# Patient Record
Sex: Male | Born: 1955 | Race: White | Hispanic: No | Marital: Married | Smoking: Never smoker
Health system: Southern US, Community
[De-identification: ages and names within clinical notes are randomized; demographics above are authoritative.]

## PROBLEM LIST (undated history)

## (undated) DIAGNOSIS — C4490 Unspecified malignant neoplasm of skin, unspecified: Secondary | ICD-10-CM

## (undated) DIAGNOSIS — R0609 Other forms of dyspnea: Secondary | ICD-10-CM

## (undated) DIAGNOSIS — M1990 Unspecified osteoarthritis, unspecified site: Secondary | ICD-10-CM

## (undated) DIAGNOSIS — L4050 Arthropathic psoriasis, unspecified: Secondary | ICD-10-CM

## (undated) DIAGNOSIS — G540 Brachial plexus disorders: Secondary | ICD-10-CM

## (undated) DIAGNOSIS — F419 Anxiety disorder, unspecified: Secondary | ICD-10-CM

## (undated) DIAGNOSIS — K219 Gastro-esophageal reflux disease without esophagitis: Secondary | ICD-10-CM

## (undated) DIAGNOSIS — I480 Paroxysmal atrial fibrillation: Secondary | ICD-10-CM

## (undated) DIAGNOSIS — Q211 Atrial septal defect: Secondary | ICD-10-CM

## (undated) DIAGNOSIS — K222 Esophageal obstruction: Secondary | ICD-10-CM

## (undated) DIAGNOSIS — L409 Psoriasis, unspecified: Secondary | ICD-10-CM

## (undated) DIAGNOSIS — I63413 Cerebral infarction due to embolism of bilateral middle cerebral arteries: Secondary | ICD-10-CM

## (undated) DIAGNOSIS — G4730 Sleep apnea, unspecified: Secondary | ICD-10-CM

## (undated) DIAGNOSIS — E663 Overweight: Secondary | ICD-10-CM

## (undated) DIAGNOSIS — J45909 Unspecified asthma, uncomplicated: Secondary | ICD-10-CM

## (undated) DIAGNOSIS — K559 Vascular disorder of intestine, unspecified: Secondary | ICD-10-CM

## (undated) DIAGNOSIS — Q2112 Patent foramen ovale: Secondary | ICD-10-CM

## (undated) DIAGNOSIS — I2510 Atherosclerotic heart disease of native coronary artery without angina pectoris: Secondary | ICD-10-CM

## (undated) DIAGNOSIS — N183 Chronic kidney disease, stage 3 unspecified: Secondary | ICD-10-CM

## (undated) DIAGNOSIS — Z9889 Other specified postprocedural states: Secondary | ICD-10-CM

## (undated) DIAGNOSIS — Z8774 Personal history of (corrected) congenital malformations of heart and circulatory system: Secondary | ICD-10-CM

## (undated) DIAGNOSIS — I10 Essential (primary) hypertension: Secondary | ICD-10-CM

## (undated) DIAGNOSIS — D689 Coagulation defect, unspecified: Secondary | ICD-10-CM

## (undated) DIAGNOSIS — I2089 Other forms of angina pectoris: Secondary | ICD-10-CM

## (undated) DIAGNOSIS — G4733 Obstructive sleep apnea (adult) (pediatric): Secondary | ICD-10-CM

## (undated) DIAGNOSIS — T7840XA Allergy, unspecified, initial encounter: Secondary | ICD-10-CM

## (undated) DIAGNOSIS — E785 Hyperlipidemia, unspecified: Secondary | ICD-10-CM

## (undated) DIAGNOSIS — E669 Obesity, unspecified: Secondary | ICD-10-CM

## (undated) DIAGNOSIS — G3184 Mild cognitive impairment, so stated: Secondary | ICD-10-CM

## (undated) DIAGNOSIS — I69311 Memory deficit following cerebral infarction: Secondary | ICD-10-CM

## (undated) DIAGNOSIS — I639 Cerebral infarction, unspecified: Secondary | ICD-10-CM

## (undated) DIAGNOSIS — F32A Depression, unspecified: Secondary | ICD-10-CM

## (undated) DIAGNOSIS — G4710 Hypersomnia, unspecified: Principal | ICD-10-CM

## (undated) DIAGNOSIS — D696 Thrombocytopenia, unspecified: Secondary | ICD-10-CM

## (undated) HISTORY — DX: Sleep apnea, unspecified: G47.30

## (undated) HISTORY — DX: Other specified postprocedural states: Z98.890

## (undated) HISTORY — DX: Coagulation defect, unspecified: D68.9

## (undated) HISTORY — DX: Atherosclerotic heart disease of native coronary artery without angina pectoris: I25.10

## (undated) HISTORY — DX: Atrial septal defect: Q21.1

## (undated) HISTORY — DX: Patent foramen ovale: Q21.12

## (undated) HISTORY — DX: Brachial plexus disorders: G54.0

## (undated) HISTORY — DX: Obstructive sleep apnea (adult) (pediatric): G47.33

## (undated) HISTORY — DX: Chronic kidney disease, stage 3 unspecified: N18.30

## (undated) HISTORY — DX: Paroxysmal atrial fibrillation: I48.0

## (undated) HISTORY — DX: Overweight: E66.3

## (undated) HISTORY — PX: CARDIAC CATHETERIZATION: SHX172

## (undated) HISTORY — DX: Essential (primary) hypertension: I10

## (undated) HISTORY — DX: Chronic kidney disease, stage 3 (moderate): N18.3

## (undated) HISTORY — DX: Memory deficit following cerebral infarction: I69.311

## (undated) HISTORY — DX: Hyperlipidemia, unspecified: E78.5

## (undated) HISTORY — PX: TONSILLECTOMY: SUR1361

## (undated) HISTORY — PX: PATENT FORAMEN OVALE CLOSURE: SHX2181

## (undated) HISTORY — DX: Depression, unspecified: F32.A

## (undated) HISTORY — DX: Thrombocytopenia, unspecified: D69.6

## (undated) HISTORY — DX: Allergy, unspecified, initial encounter: T78.40XA

## (undated) HISTORY — DX: Cerebral infarction, unspecified: I63.9

## (undated) HISTORY — DX: Arthropathic psoriasis, unspecified: L40.50

## (undated) HISTORY — DX: Unspecified asthma, uncomplicated: J45.909

## (undated) HISTORY — DX: Hypersomnia, unspecified: G47.10

---

## 1982-04-02 HISTORY — PX: APPENDECTOMY: SHX54

## 2005-04-02 DIAGNOSIS — I639 Cerebral infarction, unspecified: Secondary | ICD-10-CM

## 2005-04-02 HISTORY — DX: Cerebral infarction, unspecified: I63.9

## 2005-07-31 ENCOUNTER — Ambulatory Visit: Payer: Self-pay | Admitting: Cardiology

## 2005-08-19 ENCOUNTER — Ambulatory Visit: Payer: Self-pay | Admitting: Cardiology

## 2005-08-19 ENCOUNTER — Inpatient Hospital Stay (HOSPITAL_COMMUNITY): Admission: EM | Admit: 2005-08-19 | Discharge: 2005-08-22 | Payer: Self-pay | Admitting: Emergency Medicine

## 2005-08-20 ENCOUNTER — Encounter: Payer: Self-pay | Admitting: Cardiology

## 2005-09-07 ENCOUNTER — Emergency Department (HOSPITAL_COMMUNITY): Admission: EM | Admit: 2005-09-07 | Discharge: 2005-09-07 | Payer: Self-pay | Admitting: Emergency Medicine

## 2005-11-01 ENCOUNTER — Encounter: Payer: Self-pay | Admitting: Neurology

## 2005-11-01 ENCOUNTER — Ambulatory Visit: Payer: Self-pay | Admitting: Neurology

## 2005-11-30 ENCOUNTER — Ambulatory Visit: Payer: Self-pay | Admitting: Cardiology

## 2005-12-01 ENCOUNTER — Encounter: Payer: Self-pay | Admitting: Neurology

## 2005-12-07 ENCOUNTER — Ambulatory Visit: Payer: Self-pay | Admitting: Cardiology

## 2005-12-14 ENCOUNTER — Ambulatory Visit: Payer: Self-pay | Admitting: Cardiology

## 2005-12-18 ENCOUNTER — Ambulatory Visit: Payer: Self-pay

## 2005-12-21 ENCOUNTER — Ambulatory Visit: Payer: Self-pay | Admitting: Cardiovascular Disease

## 2005-12-25 ENCOUNTER — Ambulatory Visit: Payer: Self-pay

## 2006-01-01 ENCOUNTER — Ambulatory Visit: Payer: Self-pay | Admitting: *Deleted

## 2006-01-10 ENCOUNTER — Ambulatory Visit: Payer: Self-pay | Admitting: Cardiology

## 2006-01-26 ENCOUNTER — Ambulatory Visit: Payer: Self-pay | Admitting: Family Medicine

## 2006-02-04 ENCOUNTER — Ambulatory Visit: Payer: Self-pay

## 2006-02-05 ENCOUNTER — Ambulatory Visit: Payer: Self-pay | Admitting: *Deleted

## 2006-02-19 ENCOUNTER — Ambulatory Visit: Payer: Self-pay | Admitting: Internal Medicine

## 2006-03-08 ENCOUNTER — Ambulatory Visit: Payer: Self-pay | Admitting: Cardiology

## 2006-03-19 ENCOUNTER — Ambulatory Visit: Payer: Self-pay | Admitting: Neurology

## 2006-03-22 ENCOUNTER — Ambulatory Visit: Payer: Self-pay | Admitting: Cardiovascular Disease

## 2006-04-05 ENCOUNTER — Ambulatory Visit: Payer: Self-pay | Admitting: Cardiology

## 2006-04-24 ENCOUNTER — Ambulatory Visit: Payer: Self-pay | Admitting: Cardiology

## 2006-05-10 ENCOUNTER — Ambulatory Visit: Payer: Self-pay | Admitting: *Deleted

## 2006-05-14 ENCOUNTER — Ambulatory Visit: Payer: Self-pay

## 2006-05-31 ENCOUNTER — Ambulatory Visit: Payer: Self-pay | Admitting: Internal Medicine

## 2006-06-05 ENCOUNTER — Ambulatory Visit: Payer: Self-pay | Admitting: Neurology

## 2006-06-06 ENCOUNTER — Ambulatory Visit: Payer: Self-pay | Admitting: Neurology

## 2006-06-14 ENCOUNTER — Ambulatory Visit: Payer: Self-pay | Admitting: Cardiovascular Disease

## 2006-07-09 ENCOUNTER — Ambulatory Visit: Payer: Self-pay | Admitting: Cardiology

## 2006-07-12 ENCOUNTER — Ambulatory Visit: Payer: Self-pay | Admitting: Cardiovascular Disease

## 2006-08-02 ENCOUNTER — Ambulatory Visit: Payer: Self-pay | Admitting: Cardiology

## 2006-08-27 ENCOUNTER — Ambulatory Visit: Payer: Self-pay | Admitting: Cardiology

## 2006-09-20 ENCOUNTER — Ambulatory Visit: Payer: Self-pay | Admitting: Cardiovascular Disease

## 2006-10-01 ENCOUNTER — Ambulatory Visit: Payer: Self-pay | Admitting: Cardiology

## 2006-10-08 ENCOUNTER — Ambulatory Visit: Payer: Self-pay | Admitting: Cardiology

## 2006-10-09 ENCOUNTER — Ambulatory Visit: Payer: Self-pay | Admitting: Neurology

## 2006-11-01 ENCOUNTER — Ambulatory Visit: Payer: Self-pay | Admitting: Neurology

## 2006-11-08 ENCOUNTER — Ambulatory Visit: Payer: Self-pay | Admitting: Cardiology

## 2006-11-14 ENCOUNTER — Ambulatory Visit: Payer: Self-pay | Admitting: Cardiology

## 2006-12-06 ENCOUNTER — Ambulatory Visit: Payer: Self-pay | Admitting: Cardiology

## 2007-01-03 ENCOUNTER — Ambulatory Visit: Payer: Self-pay | Admitting: Internal Medicine

## 2007-01-14 ENCOUNTER — Ambulatory Visit: Payer: Self-pay | Admitting: Neurology

## 2007-02-06 ENCOUNTER — Ambulatory Visit: Payer: Self-pay

## 2007-03-07 ENCOUNTER — Ambulatory Visit: Payer: Self-pay

## 2007-04-04 ENCOUNTER — Ambulatory Visit: Payer: Self-pay | Admitting: Cardiology

## 2007-05-02 ENCOUNTER — Ambulatory Visit: Payer: Self-pay | Admitting: Internal Medicine

## 2007-05-30 ENCOUNTER — Ambulatory Visit: Payer: Self-pay | Admitting: Cardiology

## 2007-06-27 ENCOUNTER — Ambulatory Visit: Payer: Self-pay | Admitting: Cardiology

## 2007-07-31 ENCOUNTER — Ambulatory Visit: Payer: Self-pay | Admitting: Cardiology

## 2007-08-05 ENCOUNTER — Ambulatory Visit: Payer: Self-pay

## 2007-09-01 ENCOUNTER — Ambulatory Visit: Payer: Self-pay | Admitting: Cardiology

## 2007-09-30 ENCOUNTER — Ambulatory Visit: Payer: Self-pay | Admitting: Neurology

## 2007-09-30 ENCOUNTER — Ambulatory Visit: Payer: Self-pay | Admitting: Cardiology

## 2007-10-29 ENCOUNTER — Ambulatory Visit: Payer: Self-pay | Admitting: Cardiology

## 2007-11-24 ENCOUNTER — Ambulatory Visit: Payer: Self-pay | Admitting: Internal Medicine

## 2007-12-25 ENCOUNTER — Ambulatory Visit: Payer: Self-pay | Admitting: Cardiology

## 2007-12-31 ENCOUNTER — Encounter: Admission: RE | Admit: 2007-12-31 | Discharge: 2008-02-12 | Payer: Self-pay | Admitting: Neurology

## 2008-01-22 ENCOUNTER — Ambulatory Visit: Payer: Self-pay | Admitting: Cardiology

## 2008-02-19 ENCOUNTER — Ambulatory Visit: Payer: Self-pay | Admitting: Cardiology

## 2008-03-22 ENCOUNTER — Ambulatory Visit: Payer: Self-pay | Admitting: Internal Medicine

## 2008-04-02 DIAGNOSIS — Z9889 Other specified postprocedural states: Secondary | ICD-10-CM

## 2008-04-02 DIAGNOSIS — Z951 Presence of aortocoronary bypass graft: Secondary | ICD-10-CM

## 2008-04-02 HISTORY — DX: Presence of aortocoronary bypass graft: Z95.1

## 2008-04-02 HISTORY — DX: Other specified postprocedural states: Z98.890

## 2008-04-19 ENCOUNTER — Ambulatory Visit: Payer: Self-pay | Admitting: Internal Medicine

## 2008-04-23 ENCOUNTER — Ambulatory Visit: Payer: Self-pay | Admitting: Internal Medicine

## 2008-05-06 ENCOUNTER — Encounter: Payer: Self-pay | Admitting: Internal Medicine

## 2008-05-06 ENCOUNTER — Ambulatory Visit: Payer: Self-pay | Admitting: Cardiology

## 2008-05-06 LAB — CONVERTED CEMR LAB
HCT: 43.7 % (ref 39.0–52.0)
Hemoglobin: 14.7 g/dL (ref 13.0–17.0)
INR: 2 — ABNORMAL HIGH (ref 0.0–1.5)
MCV: 88.3 fL (ref 78.0–100.0)
RBC: 4.95 M/uL (ref 4.22–5.81)
RDW: 13.3 % (ref 11.5–15.5)
Sodium: 141 meq/L (ref 135–145)
WBC: 7.1 10*3/uL (ref 4.0–10.5)

## 2008-05-10 ENCOUNTER — Encounter: Payer: Self-pay | Admitting: Cardiothoracic Surgery

## 2008-05-10 ENCOUNTER — Inpatient Hospital Stay (HOSPITAL_COMMUNITY): Admission: AD | Admit: 2008-05-10 | Discharge: 2008-05-17 | Payer: Self-pay | Admitting: Internal Medicine

## 2008-05-10 ENCOUNTER — Ambulatory Visit: Payer: Self-pay | Admitting: Cardiothoracic Surgery

## 2008-05-10 ENCOUNTER — Inpatient Hospital Stay (HOSPITAL_BASED_OUTPATIENT_CLINIC_OR_DEPARTMENT_OTHER): Admission: RE | Admit: 2008-05-10 | Discharge: 2008-05-10 | Payer: Self-pay | Admitting: Internal Medicine

## 2008-05-10 ENCOUNTER — Ambulatory Visit: Payer: Self-pay | Admitting: Internal Medicine

## 2008-05-11 ENCOUNTER — Encounter: Payer: Self-pay | Admitting: Cardiothoracic Surgery

## 2008-05-11 ENCOUNTER — Encounter: Payer: Self-pay | Admitting: Internal Medicine

## 2008-05-12 ENCOUNTER — Encounter (INDEPENDENT_AMBULATORY_CARE_PROVIDER_SITE_OTHER): Payer: Self-pay | Admitting: Anesthesiology

## 2008-05-12 HISTORY — PX: CORONARY ARTERY BYPASS GRAFT: SHX141

## 2008-05-17 DIAGNOSIS — Z9889 Other specified postprocedural states: Secondary | ICD-10-CM

## 2008-05-17 HISTORY — DX: Other specified postprocedural states: Z98.890

## 2008-05-19 ENCOUNTER — Ambulatory Visit: Payer: Self-pay | Admitting: Cardiology

## 2008-05-25 ENCOUNTER — Ambulatory Visit: Payer: Self-pay | Admitting: Cardiology

## 2008-05-31 ENCOUNTER — Ambulatory Visit: Payer: Self-pay

## 2008-05-31 ENCOUNTER — Ambulatory Visit: Payer: Self-pay | Admitting: Cardiovascular Disease

## 2008-06-04 ENCOUNTER — Encounter: Admission: RE | Admit: 2008-06-04 | Discharge: 2008-06-04 | Payer: Self-pay | Admitting: Cardiothoracic Surgery

## 2008-06-04 ENCOUNTER — Ambulatory Visit: Payer: Self-pay | Admitting: Cardiothoracic Surgery

## 2008-06-07 ENCOUNTER — Encounter: Payer: Self-pay | Admitting: Neurology

## 2008-06-07 ENCOUNTER — Ambulatory Visit: Payer: Self-pay | Admitting: Cardiovascular Disease

## 2008-06-10 ENCOUNTER — Ambulatory Visit: Payer: Self-pay | Admitting: Cardiology

## 2008-06-24 ENCOUNTER — Encounter: Payer: Self-pay | Admitting: Cardiology

## 2008-06-25 ENCOUNTER — Ambulatory Visit: Payer: Self-pay | Admitting: Cardiology

## 2008-07-01 ENCOUNTER — Encounter: Payer: Self-pay | Admitting: Neurology

## 2008-07-01 ENCOUNTER — Ambulatory Visit: Payer: Self-pay | Admitting: Cardiology

## 2008-07-01 ENCOUNTER — Encounter: Payer: Self-pay | Admitting: Cardiology

## 2008-07-20 ENCOUNTER — Ambulatory Visit: Payer: Self-pay | Admitting: Cardiology

## 2008-07-30 ENCOUNTER — Ambulatory Visit: Payer: Self-pay | Admitting: Internal Medicine

## 2008-07-31 ENCOUNTER — Encounter: Payer: Self-pay | Admitting: Neurology

## 2008-07-31 ENCOUNTER — Encounter: Payer: Self-pay | Admitting: Cardiology

## 2008-08-11 DIAGNOSIS — D696 Thrombocytopenia, unspecified: Secondary | ICD-10-CM | POA: Insufficient documentation

## 2008-08-11 DIAGNOSIS — Q2111 Secundum atrial septal defect: Secondary | ICD-10-CM | POA: Insufficient documentation

## 2008-08-11 DIAGNOSIS — Q211 Atrial septal defect: Secondary | ICD-10-CM

## 2008-08-11 DIAGNOSIS — E669 Obesity, unspecified: Secondary | ICD-10-CM | POA: Insufficient documentation

## 2008-08-11 DIAGNOSIS — E785 Hyperlipidemia, unspecified: Secondary | ICD-10-CM | POA: Insufficient documentation

## 2008-08-11 DIAGNOSIS — I480 Paroxysmal atrial fibrillation: Secondary | ICD-10-CM | POA: Insufficient documentation

## 2008-08-11 DIAGNOSIS — I2510 Atherosclerotic heart disease of native coronary artery without angina pectoris: Secondary | ICD-10-CM | POA: Insufficient documentation

## 2008-08-13 ENCOUNTER — Ambulatory Visit: Payer: Self-pay | Admitting: Cardiology

## 2008-08-31 ENCOUNTER — Encounter: Payer: Self-pay | Admitting: Cardiology

## 2008-09-10 ENCOUNTER — Ambulatory Visit: Payer: Self-pay | Admitting: Internal Medicine

## 2008-09-17 ENCOUNTER — Encounter: Payer: Self-pay | Admitting: Internal Medicine

## 2008-09-17 ENCOUNTER — Ambulatory Visit: Payer: Self-pay | Admitting: Internal Medicine

## 2008-09-17 DIAGNOSIS — R0602 Shortness of breath: Secondary | ICD-10-CM | POA: Insufficient documentation

## 2008-09-21 ENCOUNTER — Telehealth: Payer: Self-pay | Admitting: Cardiology

## 2008-09-27 ENCOUNTER — Encounter: Payer: Self-pay | Admitting: Internal Medicine

## 2008-09-27 ENCOUNTER — Ambulatory Visit: Payer: Self-pay

## 2008-09-30 ENCOUNTER — Encounter: Payer: Self-pay | Admitting: Cardiology

## 2008-10-07 ENCOUNTER — Ambulatory Visit: Payer: Self-pay | Admitting: Cardiology

## 2008-10-10 ENCOUNTER — Emergency Department (HOSPITAL_COMMUNITY): Admission: EM | Admit: 2008-10-10 | Discharge: 2008-10-10 | Payer: Self-pay | Admitting: Emergency Medicine

## 2008-10-19 ENCOUNTER — Encounter: Payer: Self-pay | Admitting: Internal Medicine

## 2008-10-27 ENCOUNTER — Encounter: Payer: Self-pay | Admitting: Cardiology

## 2008-10-27 ENCOUNTER — Ambulatory Visit: Payer: Self-pay | Admitting: Internal Medicine

## 2008-10-28 ENCOUNTER — Telehealth: Payer: Self-pay | Admitting: Internal Medicine

## 2008-10-29 ENCOUNTER — Encounter: Payer: Self-pay | Admitting: Internal Medicine

## 2008-10-29 ENCOUNTER — Encounter (INDEPENDENT_AMBULATORY_CARE_PROVIDER_SITE_OTHER): Payer: Self-pay | Admitting: *Deleted

## 2008-10-29 ENCOUNTER — Ambulatory Visit: Payer: Self-pay | Admitting: Cardiology

## 2008-10-29 ENCOUNTER — Telehealth: Payer: Self-pay | Admitting: Cardiology

## 2008-10-31 ENCOUNTER — Encounter: Payer: Self-pay | Admitting: Cardiology

## 2008-11-01 ENCOUNTER — Encounter: Payer: Self-pay | Admitting: Cardiology

## 2008-11-01 LAB — CONVERTED CEMR LAB
CO2: 22 meq/L (ref 19–32)
Calcium: 9.4 mg/dL (ref 8.4–10.5)
Creatinine, Ser: 1.29 mg/dL (ref 0.40–1.50)
Glucose, Bld: 87 mg/dL (ref 70–99)
Hemoglobin: 13.5 g/dL (ref 13.0–17.0)
MCV: 88.7 fL (ref 78.0–100.0)
Potassium: 4.4 meq/L (ref 3.5–5.3)
Sodium: 141 meq/L (ref 135–145)

## 2008-11-02 ENCOUNTER — Inpatient Hospital Stay (HOSPITAL_BASED_OUTPATIENT_CLINIC_OR_DEPARTMENT_OTHER): Admission: RE | Admit: 2008-11-02 | Discharge: 2008-11-02 | Payer: Self-pay | Admitting: Internal Medicine

## 2008-11-02 ENCOUNTER — Ambulatory Visit: Payer: Self-pay | Admitting: Internal Medicine

## 2008-11-05 ENCOUNTER — Ambulatory Visit: Payer: Self-pay | Admitting: Internal Medicine

## 2008-11-05 LAB — CONVERTED CEMR LAB: INR: 1.2 (ref 0.0–1.5)

## 2008-11-08 ENCOUNTER — Telehealth (INDEPENDENT_AMBULATORY_CARE_PROVIDER_SITE_OTHER): Payer: Self-pay | Admitting: *Deleted

## 2008-11-09 ENCOUNTER — Ambulatory Visit: Payer: Self-pay

## 2008-11-09 ENCOUNTER — Encounter: Payer: Self-pay | Admitting: Internal Medicine

## 2008-11-12 ENCOUNTER — Ambulatory Visit: Payer: Self-pay | Admitting: Internal Medicine

## 2008-11-12 LAB — CONVERTED CEMR LAB
INR: 14.5
POC INR: 1.4

## 2008-11-15 ENCOUNTER — Encounter: Payer: Self-pay | Admitting: *Deleted

## 2008-11-18 ENCOUNTER — Telehealth: Payer: Self-pay | Admitting: Internal Medicine

## 2008-11-19 ENCOUNTER — Ambulatory Visit: Payer: Self-pay | Admitting: Internal Medicine

## 2008-11-23 ENCOUNTER — Ambulatory Visit: Payer: Self-pay | Admitting: Psychology

## 2008-11-26 ENCOUNTER — Ambulatory Visit: Payer: Self-pay | Admitting: Internal Medicine

## 2008-12-01 ENCOUNTER — Encounter: Payer: Self-pay | Admitting: Cardiology

## 2008-12-03 ENCOUNTER — Ambulatory Visit: Payer: Self-pay | Admitting: Cardiovascular Disease

## 2008-12-17 ENCOUNTER — Ambulatory Visit: Payer: Self-pay | Admitting: Cardiovascular Disease

## 2008-12-17 LAB — CONVERTED CEMR LAB: POC INR: 3

## 2008-12-31 ENCOUNTER — Encounter: Payer: Self-pay | Admitting: Cardiology

## 2009-01-14 ENCOUNTER — Ambulatory Visit: Payer: Self-pay | Admitting: Internal Medicine

## 2009-01-14 LAB — CONVERTED CEMR LAB: POC INR: 2.6

## 2009-01-31 ENCOUNTER — Encounter: Payer: Self-pay | Admitting: Cardiology

## 2009-02-02 ENCOUNTER — Encounter: Payer: Self-pay | Admitting: Cardiology

## 2009-02-11 ENCOUNTER — Ambulatory Visit: Payer: Self-pay | Admitting: Internal Medicine

## 2009-03-10 ENCOUNTER — Ambulatory Visit: Payer: Self-pay | Admitting: Family Medicine

## 2009-03-11 ENCOUNTER — Encounter: Payer: Self-pay | Admitting: Cardiology

## 2009-03-24 ENCOUNTER — Ambulatory Visit: Payer: Self-pay | Admitting: Internal Medicine

## 2009-03-24 LAB — CONVERTED CEMR LAB: POC INR: 3

## 2009-04-02 DIAGNOSIS — Z955 Presence of coronary angioplasty implant and graft: Secondary | ICD-10-CM

## 2009-04-02 HISTORY — DX: Presence of coronary angioplasty implant and graft: Z95.5

## 2009-04-21 ENCOUNTER — Ambulatory Visit: Payer: Self-pay | Admitting: Internal Medicine

## 2009-04-21 LAB — CONVERTED CEMR LAB: POC INR: 2.2

## 2009-05-12 ENCOUNTER — Encounter: Payer: Self-pay | Admitting: Cardiovascular Disease

## 2009-05-13 ENCOUNTER — Encounter: Payer: Self-pay | Admitting: Cardiovascular Disease

## 2009-05-13 ENCOUNTER — Ambulatory Visit: Payer: Self-pay | Admitting: Cardiovascular Disease

## 2009-05-13 DIAGNOSIS — I25708 Atherosclerosis of coronary artery bypass graft(s), unspecified, with other forms of angina pectoris: Secondary | ICD-10-CM | POA: Insufficient documentation

## 2009-05-13 LAB — CONVERTED CEMR LAB
Cholesterol, target level: 200 mg/dL
HDL goal, serum: 40 mg/dL
POC INR: 2.1

## 2009-05-25 ENCOUNTER — Telehealth: Payer: Self-pay | Admitting: Cardiovascular Disease

## 2009-06-16 ENCOUNTER — Ambulatory Visit: Payer: Self-pay | Admitting: Cardiovascular Disease

## 2009-06-16 LAB — CONVERTED CEMR LAB: POC INR: 2.8

## 2009-06-29 ENCOUNTER — Telehealth: Payer: Self-pay | Admitting: Cardiovascular Disease

## 2009-07-20 ENCOUNTER — Ambulatory Visit: Payer: Self-pay | Admitting: Cardiovascular Disease

## 2009-07-31 HISTORY — PX: OTHER SURGICAL HISTORY: SHX169

## 2009-08-19 HISTORY — PX: CHOLECYSTECTOMY: SHX55

## 2009-08-22 ENCOUNTER — Ambulatory Visit: Payer: Self-pay | Admitting: Cardiovascular Disease

## 2009-09-02 ENCOUNTER — Telehealth (INDEPENDENT_AMBULATORY_CARE_PROVIDER_SITE_OTHER): Payer: Self-pay

## 2009-09-05 ENCOUNTER — Ambulatory Visit: Payer: Self-pay | Admitting: Cardiovascular Disease

## 2009-09-12 LAB — CONVERTED CEMR LAB
Cholesterol: 156 mg/dL (ref 0–200)
HDL: 40 mg/dL (ref >39)
LDL Cholesterol: 84 mg/dL (ref 0–99)
Total CHOL/HDL Ratio: 3.9
Triglycerides: 159 mg/dL — ABNORMAL HIGH (ref <150)
VLDL: 32 mg/dL (ref 0–40)

## 2009-09-20 ENCOUNTER — Ambulatory Visit: Payer: Self-pay | Admitting: Cardiovascular Disease

## 2009-09-29 ENCOUNTER — Ambulatory Visit: Payer: Self-pay | Admitting: Cardiovascular Disease

## 2009-09-30 ENCOUNTER — Telehealth: Payer: Self-pay | Admitting: Cardiovascular Disease

## 2009-09-30 ENCOUNTER — Encounter: Payer: Self-pay | Admitting: Cardiovascular Disease

## 2009-10-11 ENCOUNTER — Encounter: Payer: Self-pay | Admitting: Cardiovascular Disease

## 2009-10-12 ENCOUNTER — Ambulatory Visit: Payer: Self-pay | Admitting: Cardiovascular Disease

## 2009-10-18 ENCOUNTER — Telehealth: Payer: Self-pay | Admitting: Cardiovascular Disease

## 2009-10-21 ENCOUNTER — Encounter: Payer: Self-pay | Admitting: Cardiovascular Disease

## 2009-11-02 ENCOUNTER — Ambulatory Visit: Payer: Self-pay | Admitting: Cardiovascular Disease

## 2009-11-08 ENCOUNTER — Encounter: Payer: Self-pay | Admitting: Cardiovascular Disease

## 2009-11-08 ENCOUNTER — Observation Stay (HOSPITAL_COMMUNITY): Admission: RE | Admit: 2009-11-08 | Discharge: 2009-11-09 | Payer: Self-pay | Admitting: Cardiovascular Disease

## 2009-11-08 ENCOUNTER — Ambulatory Visit: Payer: Self-pay | Admitting: Cardiovascular Disease

## 2009-11-10 ENCOUNTER — Encounter: Payer: Self-pay | Admitting: Cardiovascular Disease

## 2009-11-11 ENCOUNTER — Encounter: Payer: Self-pay | Admitting: Cardiovascular Disease

## 2009-11-11 ENCOUNTER — Ambulatory Visit: Payer: Self-pay | Admitting: Cardiovascular Disease

## 2009-11-11 LAB — CONVERTED CEMR LAB: POC INR: 1.1

## 2009-11-16 ENCOUNTER — Telehealth: Payer: Self-pay | Admitting: Cardiovascular Disease

## 2009-11-23 ENCOUNTER — Ambulatory Visit: Payer: Self-pay | Admitting: Cardiovascular Disease

## 2009-12-01 ENCOUNTER — Encounter: Payer: Self-pay | Admitting: Cardiovascular Disease

## 2009-12-20 ENCOUNTER — Telehealth: Payer: Self-pay | Admitting: Cardiovascular Disease

## 2009-12-31 ENCOUNTER — Encounter: Payer: Self-pay | Admitting: Cardiovascular Disease

## 2010-01-31 ENCOUNTER — Encounter: Payer: Self-pay | Admitting: Cardiovascular Disease

## 2010-03-02 ENCOUNTER — Encounter: Payer: Self-pay | Admitting: Cardiovascular Disease

## 2010-03-31 ENCOUNTER — Telehealth: Payer: Self-pay | Admitting: Cardiovascular Disease

## 2010-04-02 ENCOUNTER — Encounter: Payer: Self-pay | Admitting: Cardiovascular Disease

## 2010-04-04 ENCOUNTER — Ambulatory Visit
Admission: RE | Admit: 2010-04-04 | Discharge: 2010-04-04 | Payer: Self-pay | Source: Home / Self Care | Attending: Cardiovascular Disease | Admitting: Cardiovascular Disease

## 2010-04-04 ENCOUNTER — Encounter: Payer: Self-pay | Admitting: Cardiovascular Disease

## 2010-04-30 LAB — CONVERTED CEMR LAB
BUN: 14 mg/dL (ref 6–23)
Basophils Relative: 1 % (ref 0–1)
Chloride: 105 meq/L (ref 96–112)
Creatinine, Ser: 1.24 mg/dL (ref 0.40–1.50)
Eosinophils Absolute: 0.5 10*3/uL (ref 0.0–0.7)
Eosinophils Relative: 8 % — ABNORMAL HIGH (ref 0–5)
Glucose, Bld: 75 mg/dL (ref 70–99)
HCT: 40.8 % (ref 39.0–52.0)
INR: 2.19 — ABNORMAL HIGH (ref <1.50)
MCHC: 32.6 g/dL (ref 30.0–36.0)
MCV: 92.7 fL (ref 78.0–100.0)
Neutrophils Relative %: 56 % (ref 43–77)
Platelets: 202 10*3/uL (ref 150–400)
Potassium: 4.2 meq/L (ref 3.5–5.3)
Prothrombin Time: 24.5 s — ABNORMAL HIGH (ref 11.6–15.2)

## 2010-05-03 ENCOUNTER — Encounter: Payer: Self-pay | Admitting: Cardiovascular Disease

## 2010-05-03 NOTE — Progress Notes (Signed)
Summary: Cath to Schedule  Phone Note Call from Patient Call back at Home Phone [phone]   Caller: Spouse Call For: gollan Summary of Call: Patient's wife called and said he is ready to schedule his cath. Pt's wife would like it on a Friday,but there is no interventional back-up on a Friday for at least a month. We might need to look at other days. Initial call taken by: [place] Carbo,  October 18, 2009 8:38 AM  Follow-up for Phone Call        Spoke to pt's wife are unable to have cath 7/27 due to mother having several appointments scheduled. Will talk to Dr. Mariah Milling about 7/28 and questions about coumadin. Benedict Needy, RN  October 18, 2009 9:21 AM   [facility] TCB Benedict Needy, RN  October 18, 2009 4:34 PM   [facility] TCB Benedict Needy, RN  October 19, 2009 9:50 AM   Additional Follow-up for Phone Call Additional follow up Details #1::        spoke to wife will schedule at [facility] either 8/9 or 8/11 Additional Follow-up by: Benedict Needy, RN,  October 19, 2009 10:10 AM

## 2010-05-03 NOTE — Medication Information (Signed)
Summary: CCR/AMD  Anticoagulant Therapy  Managed by: Robyn Haber, RN, BSN Referring MD: Valera Castle PCP: Julieanne Manson MD Supervising MD: Mariah Milling Indication 1: Atrial Fibrillation (chronic) Indication 2: Cerebrovascular Accident Lab Used: [facility] Site: [place] INR POC 2.8 INR RANGE 2.0-3.0  Dietary changes: no    Health status changes: no    Bleeding/hemorrhagic complications: no    Recent/future hospitalizations: no    Any changes in medication regimen? no    Recent/future dental: no  Any missed doses?: no       Is patient compliant with meds? yes       Allergies: 1)  ! * Strawberries  Anticoagulation Management History:      The patient is taking warfarin and comes in today for a routine follow up visit.  Negative risk factors for bleeding include an age less than 79 years old.  The bleeding index is 'low risk'.  Negative CHADS2 values include Age > 50 years old.  The start date was 11/30/2005.  His last INR was 1.9.  Anticoagulation responsible provider: Gollan.  INR POC: 2.8.    Anticoagulation Management Assessment/Plan:      The patient's current anticoagulation dose is Coumadin 2 mg tabs: as directed.  The target INR is 2.5-3.0.  The next INR is due 07/14/2009.  Anticoagulation instructions were given to patient.  Results were reviewed/authorized by Robyn Haber, RN, BSN.  He was notified by Charlena Cross, RN, BSN.         Prior Anticoagulation Instructions: The patient is to continue with the same dose of coumadin.  This dosage includes: coumadin 2.5 mg daily with 5 mg on M W F   Current Anticoagulation Instructions: The patient is to continue with the same dose of coumadin.  This dosage includes: coumadin 2.5 mg daily with 5 mg M W F

## 2010-05-03 NOTE — Medication Information (Signed)
Summary: CCR/GLC  Anticoagulant Therapy  Managed by: Cloyde Reams, RN, BSN Referring MD: Valera Castle PCP: Julieanne Manson MD Supervising MD: Mariah Milling Indication 1: Atrial Fibrillation (chronic) Indication 2: Cerebrovascular Accident Lab Used: [facility]  Site: [place] INR POC 3.4 INR RANGE 2.0-3.0  Dietary changes: yes       Details: Gallbladder removed recently, diet has varied.    Health status changes: no    Bleeding/hemorrhagic complications: no    Recent/future hospitalizations: no    Any changes in medication regimen? yes       Details: Started on OTC Iron supplement.     Any missed doses?: no       Is patient compliant with meds? yes       Allergies: 1)  ! * Strawberries  Anticoagulation Management History:      The patient is taking warfarin and comes in today for a routine follow up visit.  Negative risk factors for bleeding include an age less than 53 years old.  The bleeding index is 'low risk'.  Negative CHADS2 values include Age > 27 years old.  The start date was 11/30/2005.  His last INR was 1.9.  Anticoagulation responsible provider: Xyon Lukasik.  INR POC: 3.4.  Cuvette Lot#: [id number].  Exp: 11/2010.    Anticoagulation Management Assessment/Plan:      The patient's current anticoagulation dose is Coumadin 5 mg tabs: as directed by anticoagulation clinic.  The target INR is 2.5-3.0.  The next INR is due 09/19/2009.  Anticoagulation instructions were given to patient.  Results were reviewed/authorized by Cloyde Reams, RN, BSN.  He was notified by Cloyde Reams RN.         Prior Anticoagulation Instructions: INR 2.5  Continue on same dosage 1/2 tablet daily except 1 tablet on Mondays, Wednesdays, and Fridays.  Recheck in 2 weeks.    Current Anticoagulation Instructions: INR 3.4  Skip today's dosage of coumadin, then resume same dosage 1/2 tablet daily except 1 tablet on Mondays, Wednesdays, and Fridays.  Recheck in 2  weeks.

## 2010-05-03 NOTE — Letter (Signed)
Summary: Cardiac Catheterization Instructions- Main Lab  [facility] at [address]   [place], [place] [address]   Phone: [phone]  Fax: [phone]     10/21/2009 MRN: [id number]  [facility] [address] [place], [place]  [address]  Dear Mr. Menzel,   You are scheduled for Cardiac Catheterization on  August 9,2011  with Dr.McAlhany.  Please arrive at the [facility] at 5:30      a.m. on the day of your procedure.  1. DIET     __x__ Nothing to eat or drink after midnight except your medications with a sip of water.  2. Come to the [place] office on August 8 at 9:00am  for lab work.    3. MAKE SURE YOU TAKE YOUR ASPIRIN.  4. ___x__ DO NOT TAKE these medications before your procedure:         STOP TAKING COUMADIN ON AUGUST 4TH      __x__ YOU MAY TAKE ALL of your remaining medications with a small amount of water.  5. Plan for one night stay - bring personal belongings (i.e. toothpaste, toothbrush, etc.)  6. Bring a current list of your medications and current insurance cards.  7. Must have a responsible person to drive you home.   8. Someone must be with yu for the first 24 hours after you arrive home.  9. Please wear clothes that are easy to get on and off and wear slip-on shoes.  *Special note: Every effort is made to have your procedure done on time.  Occasionally there are emergencies that present themselves at the hospital that may cause delays.  Please be patient if a delay does occur.  If you have any questions after you get home, please call the office at the number listed above.  Benedict Needy, RN

## 2010-05-03 NOTE — Medication Information (Signed)
Summary: CCR  Anticoagulant Therapy  Managed by: Cloyde Reams, RN, BSN Referring MD: Valera Castle PCP: Julieanne Manson MD Supervising MD: Mariah Milling Indication 1: Atrial Fibrillation (chronic) Indication 2: Cerebrovascular Accident Lab Used: [facility] Site: [place] INR POC 2.4 INR RANGE 2.0-3.0  Dietary changes: yes       Details: Incr salad, vit K intake, going to cut back on salads some.  Health status changes: no    Bleeding/hemorrhagic complications: no    Recent/future hospitalizations: no    Any changes in medication regimen? yes       Details: Changing from Simvastatin to Crestor.   Recent/future dental: no  Any missed doses?: no       Is patient compliant with meds? yes       Allergies: 1)  ! * Strawberries  Anticoagulation Management History:      The patient is taking warfarin and comes in today for a routine follow up visit.  Negative risk factors for bleeding include an age less than 72 years old.  The bleeding index is 'low risk'.  Negative CHADS2 values include Age > 64 years old.  The start date was 11/30/2005.  His last INR was 1.9.  Anticoagulation responsible provider: Melicia Esqueda.  INR POC: 2.4.  Cuvette Lot#: [id number].  Exp: 12/2010.    Anticoagulation Management Assessment/Plan:      The patient's current anticoagulation dose is Coumadin 5 mg tabs: as directed by anticoagulation clinic.  The target INR is 2.5-3.0.  The next INR is due 10/11/2009.  Anticoagulation instructions were given to patient.  Results were reviewed/authorized by Cloyde Reams, RN, BSN.  He was notified by Cloyde Reams RN.         Prior Anticoagulation Instructions: INR 3.4  Skip today's dosage of coumadin, then resume same dosage 1/2 tablet daily except 1 tablet on Mondays, Wednesdays, and Fridays.  Recheck in 2 weeks.    Current Anticoagulation Instructions: INR 2.4  Continue on same dosage 1/2 tablet daily except 1 tablet on Mondays,  Wednesdays, and Fridays.  Recheck in 3 weeks.

## 2010-05-03 NOTE — Medication Information (Signed)
Summary: CCR/AMD  Anticoagulant Therapy  Managed by: Cloyde Reams, RN, BSN Referring MD: Valera Castle PCP: Julieanne Manson MD Supervising MD: Mariah Milling Indication 1: Atrial Fibrillation (chronic) Indication 2: Cerebrovascular Accident Lab Used: [facility] Site:  INR POC 3.4 INR RANGE 2.0-3.0  Dietary changes: yes       Details: Decr vit K intake.  Health status changes: no    Bleeding/hemorrhagic complications: no    Recent/future hospitalizations: no    Any changes in medication regimen? no    Recent/future dental: no  Any missed doses?: no       Is patient compliant with meds? yes       Allergies: 1)  ! * Strawberries  Anticoagulation Management History:      The patient is taking warfarin and comes in today for a routine follow up visit.  Negative risk factors for bleeding include an age less than 75 years old.  The bleeding index is 'low risk'.  Negative CHADS2 values include Age > 33 years old.  The start date was 11/30/2005.  His last INR was 1.9.  Anticoagulation responsible provider: Jernard Reiber.  INR POC: 3.4.  Cuvette Lot#: [id number].  Exp: 12/2010.    Anticoagulation Management Assessment/Plan:      The patient's current anticoagulation dose is Coumadin 5 mg tabs: as directed by anticoagulation clinic.  The target INR is 2.5-3.0.  The next INR is due 11/02/2009.  Anticoagulation instructions were given to patient.  Results were reviewed/authorized by Cloyde Reams, RN, BSN.  He was notified by Cloyde Reams RN.         Prior Anticoagulation Instructions: INR 2.4  Continue on same dosage 1/2 tablet daily except 1 tablet on Mondays, Wednesdays, and Fridays.  Recheck in 3 weeks.    Current Anticoagulation Instructions: INR 3.4  Start taking 1/2 tablet daily except 1 tablet on Mondays and Fridays.  Recheck in 3 weeks.

## 2010-05-03 NOTE — Miscellaneous (Signed)
Summary: Rehab Report  Rehab Report   Imported By: West Carbo 10/04/2009 16:41:42  _____________________________________________________________________  External Attachment:    Type:   Image     Comment:   External Document

## 2010-05-03 NOTE — Assessment & Plan Note (Signed)
Summary: EPH/AMD   Visit Type:  Follow-up Referring Tynisa Vohs:  Dr. Gala Romney Primary Leatha Rohner:  Julieanne Manson MD  CC:  Denies shortness of breath. Does have mid-sternum chest pressure.Marland Kitchen  History of Present Illness: Kevin [name] is a 55 y/o male with h/o CAD, atrial fib, HTN, HL, OSA and previous CVA. He underwent CABG with Maze and PFO closure in February 2010 cath in 10/2008  for exertional dyspnea and CP. Cath with 2-V CAD. LAD 95% mid, LCX small RCA large OK.  SVG -> OM1 and OM2 was occluded. SVG-Diag was widely patent and backfilled the LAD well. LIMA - LAD was atretic. Myoview to assess LAD for ischemia and there was very mild reversible defect in very distal anterior wall and apex. Decision made to manage him medically.  he continued to have episodes of chest pain and recently underwent PCI of the LAD with 2 stents on August 9. He states that currently he has significantly improved chest pain. He does have occasional muscle soreness though overall is doing much better. He has not been back to work and is concerned about recurrent chest pain with exertion. He is due to participate in cardiac rehabilitation starting this week.  he did call to report that he was having some chest discomfort shortly after his cardiac catheterization. We increase his isosorbide to 30 mg b.i.d. and he states that this improved his symptoms.  wife does work for cardiac rehabilitation at [employer]. He recently purchased a treadmill.  EKG shows normal sinus rhythm with rate of 63 beats per minute, no significant ST or T wave changes.  Current Medications (verified): 1)  Metoprolol Tartrate 50 Mg Tabs (Metoprolol Tartrate) .... Take One Tablet By Mouth Twice A Day 2)  Aspirin Ec 325 Mg Tbec (Aspirin) .... Take One Tablet By Mouth Daily 3)  Lexapro 20 Mg Tabs (Escitalopram Oxalate) .... Take 1 By Mouth Once Daily 4)  Alprazolam 0.5 Mg Xr24h-Tab (Alprazolam) .... Take As Directed As Needed 5)   Zantac 300 Mg Tabs (Ranitidine Hcl) .... Take 1 By Mouth At Bedtime 6)  Ranexa 1000 Mg Xr12h-Tab (Ranolazine) .... Take One Tablet By Mouth Twice A Day 7)  Isosorbide Mononitrate Cr 30 Mg Xr24h-Tab (Isosorbide Mononitrate) .... Take 1 Tablet By Mouth Two Times A Day 8)  Crestor 10 Mg Tabs (Rosuvastatin Calcium) .... Take One Tablet By Mouth Daily. 9)  Nitrostat 0.4 Mg Subl (Nitroglycerin) .Marland Kitchen.. 1 Tablet Under Tongue At Onset of Chest Pain; You May Repeat Every 5 Minutes For Up To 3 Doses. 10)  Effient 10 Mg Tabs (Prasugrel Hcl) .... One Tablet Once Daily 11)  Tylenol Extra Strength 500 Mg Tabs (Acetaminophen) .... As Needed  Allergies (verified): 1)  ! 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  Alcohol Use - no  Risk Factors: Smoking Status: never (08/11/2008)  Review of Systems  The patient denies fever, weight loss, weight gain, vision loss, decreased hearing,  hoarseness, chest pain, syncope, dyspnea on exertion, peripheral edema, prolonged cough, abdominal pain, incontinence, muscle weakness, depression, and enlarged lymph nodes.         rare mild chest discomfort described as a muscle pain  Vital Signs:  Patient profile:   55 year old male Height:      66 inches Weight:       176 pounds BMI:     28.51 Pulse rate:   65 / minute BP sitting:   104 / 71  (left arm) Cuff size:   regular  Vitals Entered By: Bishop Dublin, CMA (November 23, 2009 11:13 AM)  Physical Exam  General:  Well developed, well nourished, in no acute distress. Head:  normocephalic and atraumatic Neck:  Neck supple, no JVD. No masses, thyromegaly or abnormal cervical nodes. Lungs:  Clear bilaterally to auscultation and percussion. Heart:  Non-displaced PMI, chest non-tender; regular rate and rhythm, S1, S2 without murmurs, rubs or gallops. Carotid upstroke normal, no bruit.  Pedals normal pulses. No edema, no varicosities. Abdomen:  Bowel sounds positive; abdomen soft and non-tender without masses Msk:  Back normal, normal gait. Muscle strength and tone normal. Pulses:  pulses normal in all 4 extremities Extremities:  No clubbing or cyanosis. Neurologic:  Alert and oriented x 3. Skin:  Intact without lesions or rashes. Psych:  Normal affect.   Impression & Recommendations:  Problem # 1:  CAD, ARTERY BYPASS GRAFT (ICD-414.04) he is doing well following his recent PCI x2 to the LAD. We'll continue him on aggressive medical management.  His updated medication list for this problem includes:    Metoprolol Tartrate 50 Mg Tabs (Metoprolol tartrate) .Marland Kitchen... Take one tablet by mouth twice a day    Aspirin Ec 325 Mg Tbec (Aspirin) .Marland Kitchen... Take one tablet by mouth daily    Ranexa 1000 Mg Xr12h-tab (Ranolazine) .Marland Kitchen... Take one tablet by mouth twice a day    Isosorbide Mononitrate Cr 30 Mg Xr24h-tab (Isosorbide mononitrate) .Marland Kitchen... Take 1 tablet by mouth two times a day    Nitrostat 0.4 Mg Subl (Nitroglycerin) .Marland Kitchen... 1 tablet under tongue at onset of chest pain; you may repeat every 5 minutes for up to 3 doses.    Effient 10 Mg Tabs (Prasugrel hcl) ..... One tablet once daily  Problem # 2:  ATRIAL FIBRILLATION (ICD-427.31) He is currently off warfarin, no further episodes of atrial fibrillation. Good  rhythm control.  His updated medication list for this problem includes:    Metoprolol Tartrate 50 Mg Tabs (Metoprolol tartrate) .Marland Kitchen... Take one tablet by mouth twice a day    Aspirin Ec 325 Mg Tbec (Aspirin) .Marland Kitchen... Take one tablet by mouth daily    Effient 10 Mg Tabs (Prasugrel hcl) ..... One tablet once daily  Problem # 3:  HYPERLIPIDEMIA-MIXED (ICD-272.4) we recently changed him from simvastatin to Crestor 2 months ago. We will suggest checking his cholesterol in the next month.  His updated medication list for this problem includes:    Crestor 10 Mg Tabs (Rosuvastatin calcium) .Marland Kitchen... Take one tablet by mouth daily.  Orders: T-Hepatic Function [id number]) T-Lipid Profile [id number])  Problem # 4:  CHEST TIGHTNESS-PRESSURE-OTHER ([id number]) If he continues to have chest pain symptoms, he can take nitroglycerin sublingual. He is limited in increasing his isosorbide due to his low blood pressure. He needs to participate in cardiac rehabilitation.  Other Orders: EKG w/ Interpretation ([id number])  Patient Instructions: 1)  Your physician recommends that you return for a FASTING lipid profile: [facility] (LIP/LFT) 2)  Your physician recommends that you continue on your current medications as directed. Please refer to the Current Medication list given to you today. 3)  Your physician wants you to follow-up in: 3 months   You will receive a reminder letter in the mail two months in advance. If you don't receive a letter, please call our office to schedule the follow-up appointment.

## 2010-05-03 NOTE — Letter (Signed)
Summary: [facility]  [facility]   Imported By: Marylou Mccoy 03/10/2010 10:07:50  _____________________________________________________________________  External Attachment:    Type:   Image     Comment:   External Document

## 2010-05-03 NOTE — Medication Information (Signed)
Summary: CCR/AMD  Anticoagulant Therapy  Managed by: Cloyde Reams, RN, BSN Referring MD: Valera Castle PCP: Julieanne Manson MD Supervising MD: Excell Seltzer MD, Casimiro Needle Indication 1: Atrial Fibrillation (chronic) Indication 2: Cerebrovascular Accident Lab Used: [facility] Site: [place] INR POC 2.5 INR RANGE 2.0-3.0  Dietary changes: no     Bleeding/hemorrhagic complications: no    Recent/future hospitalizations: no    Any changes in medication regimen? yes       Details: Has been tapering up Imdur dosage .  At 30mg  daily today  Recent/future dental: no  Any missed doses?: no       Is patient compliant with meds? yes       Allergies: 1)  ! * Strawberries  Anticoagulation Management History:      The patient is taking warfarin and comes in today for a routine follow up visit.  Negative risk factors for bleeding include an age less than 60 years old.  The bleeding index is 'low risk'.  Negative CHADS2 values include Age > 22 years old.  The start date was 11/30/2005.  His last INR was 1.9.  Anticoagulation responsible provider: Excell Seltzer MD, Casimiro Needle.  INR POC: 2.5.    Anticoagulation Management Assessment/Plan:      The patient's current anticoagulation dose is Coumadin 5 mg tabs: as directed by anticoagulation clinic.  The target INR is 2.5-3.0.  The next INR is due 08/17/2009.  Anticoagulation instructions were given to patient.  Results were reviewed/authorized by Cloyde Reams, RN, BSN.  He was notified by Cloyde Reams RN.         Prior Anticoagulation Instructions: The patient is to continue with the same dose of coumadin.  This dosage includes: coumadin 2.5 mg daily with 5 mg M W F   Current Anticoagulation Instructions: INR 2.5  Continue on same dosage 1/2 tablet daily except 1 tablet on Mondays, Wednesdays, and Fridays.  Rechek in 4 weeks.

## 2010-05-03 NOTE — Assessment & Plan Note (Signed)
Summary: ROV/AMD   Visit Type:  rov Referring Provider:  Dr. Gala Romney Primary Provider:  Julieanne Manson MD   History of Present Illness: Kevin [name] is a 55 y/o male with h/o CAD, atrial fib, HTN, HL, OSA and previous CVA. He underwent CABG with Maze and PFO closure in February 2010.  Recently underwent cath for exertional dyspnea and CP. Cath with 2-V CAD. LAD 95% mid, LCX small RCA large OK.  SVG -> OM1 and OM2 was occluded. SVG-Diag was widely patent and backfilled the LAD well. LIMA - LAD was atretic. Myoview to assess LAD for ischemia and there was very mild reversible defect in very distal anterior wall and apex. Decision made to manage him medically.  Kevin [name] states that recently he has felt more shortness of breath, discomfort in the pectoral region on the left. 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 She denies any significant chest pain when he is active apart from this discomfort in the left pectoral region which occurs sometimes at rest. He is sometimes able to massage the area which seemed to help to some degree.  He has not been exercising although his wife does work for cardiac rehabilitation at [facility]. He recently purchased a treadmill.  Lipid Management History:      Positive NCEP/ATP III risk factors include male age 18 years old or older and ASHD (either angina/prior MI/prior CABG).  Negative NCEP/ATP III risk factors include non-tobacco-user status.     Current Problems (verified): 1)  Chest Tightness-pressure-other  ([id number]) 2)  Chest Tightness-pressure-other  ([id number]) 3)  Dyspnea  (ICD-786.05) 4)  Cad, Unspecified Site  (ICD-414.00) 5)  Atrial Fibrillation  (ICD-427.31) 6)  Overweight/obesity  (ICD-278.02) 7)  Hyperlipidemia-mixed  (ICD-272.4) 8)  Patent Foramen Ovale  (ICD-745.5) 9)  Sleep Apnea, Obstructive  (ICD-327.23) 10)  Thrombocytopenia  (ICD-287.5)  Current Medications (verified): 1)  Metoprolol  Tartrate 50 Mg Tabs (Metoprolol Tartrate) .... Take One Tablet By Mouth Twice A Day 2)  Simvastatin 40 Mg Tabs (Simvastatin) .... Take 1 Tablet By Mouth At Bedtime 3)  Coumadin 2 Mg Tabs (Warfarin Sodium) .... As Directed 4)  Aspirin 81 Mg Tbec (Aspirin) .... Take One Tablet By Mouth Daily 5)  Lexapro 20 Mg Tabs (Escitalopram Oxalate) .... Take 1 By Mouth Once Daily 6)  Alprazolam 0.5 Mg Xr24h-Tab (Alprazolam) .... Take As Directed As Needed 7)  Zantac 300 Mg Tabs (Ranitidine Hcl) .... Take 1 By Mouth At Bedtime  Allergies (verified): 1)  ! * Strawberries  Past History:  Past Medical History: Last updated: 09/17/2008 CAD, UNSPECIFIED SITE (ICD-414.00) s/p CABG ATRIAL FIBRILLATION (ICD-427.31) s/p Maze OVERWEIGHT/OBESITY (ICD-278.02) HYPERLIPIDEMIA-MIXED (ICD-272.4) PATENT FORAMEN OVALE (ICD-745.5) s/p closure SLEEP APNEA, OBSTRUCTIVE (ICD-327.23) THROMBOCYTOPENIA (ICD-287.5)    Past Surgical History: Last updated: 08/11/2008 CABG x 4  by Kerin Perna, M.D 05/12/08  Social History: Last updated: 08/11/2008 Full Time Married  Tobacco Use - No.  Alcohol Use - no  Risk Factors: Smoking Status: never (08/11/2008)  Review of Systems       The patient complains of chest pain and dyspnea on exertion.  The patient denies anorexia, fever, weight loss, weight gain, vision loss, decreased hearing, hoarseness, syncope, peripheral edema, prolonged cough, headaches, hemoptysis, abdominal pain, melena, hematochezia, severe indigestion/heartburn, hematuria, incontinence, genital sores, muscle weakness, suspicious skin lesions, transient blindness, difficulty walking, depression, unusual weight change, abnormal bleeding, enlarged lymph nodes, angioedema, breast masses, and testicular masses.    New Orders:  1)  T-Lipid Profile [id number])     2)  T-Hepatic Function [id number])   Vital Signs:  Patient profile:   55 year old male Height:      66 inches Weight:      192.75  pounds BMI:     31.22 Pulse rate:   71 / minute Pulse rhythm:   regular BP sitting:   98 / 66  (left arm) Cuff size:   regular  Vitals Entered By: Mercer Pod (May 13, 2009 2:22 PM)  Physical Exam  General:  well-appearing gentleman in no apparent distress, alert and oriented x3, HEENT exam is benign, oropharynx is clear, neck is supple with no JVP or carotid bruits,  heart sounds are regular with normal S1-S2 and no murmurs appreciated, and lungs are clear to auscultation with no wheezes or Rales, abdominal exam is benign,  he has no significant lower extremity edema. Neurological exam is grossly nonfocal skin is warm and dry. Pulses are equal and symmetrical in his upper and lower extremities.   EKG  Procedure date:  05/13/2009  Findings:      normal sinus rhythm with rate of 71 beats per minute, no significant ST or T wave changes.  Impression & Recommendations:  Problem # 1:  CHEST TIGHTNESS-PRESSURE-OTHER ([id number]) Etiology of the chest pain is likely musculoskeletal. It occurs on the left side and seems to improve with mild massage. I have encouraged him to participate in cardiac rehabilitation at [facility] and to use his treadmill at home. We will start him also on Ranexa 1000 mg b.i.d. as he states feeling more short of breath and fatigue. I've asked him to call us after he has been on this for one week to let us know if it has improved his symptoms. Have also suggested that he could retry a quarter dose of his isosorbide for one week slowly titrating up to one half dose a day if his blood pressure tolerates. Patient agrees with this plan and we'll see him again in 6 months time.  Problem # 2:  CAD, ARTERY BYPASS GRAFT (ICD-414.04)  Severe coronary artery disease, graft disease with recent catheterization and medical management recommended after stress test. We are unable to advance his medications to any significant degree due to his borderline blood pressure.  His  updated medication list for this problem includes:    Metoprolol Tartrate 50 Mg Tabs (Metoprolol tartrate) .Marland Kitchen... Take one tablet by mouth twice a day    Coumadin 2 Mg Tabs (Warfarin sodium) .Marland Kitchen... As directed    Aspirin 81 Mg Tbec (Aspirin) .Marland Kitchen... Take one tablet by mouth daily    Ranexa 1000 Mg Xr12h-tab (Ranolazine) .Marland Kitchen... Take one tablet by mouth twice a day    Isosorbide Mononitrate Cr 30 Mg Xr24h-tab (Isosorbide mononitrate) .Marland Kitchen... Take 1/4 tablet by mouth daily  Problem # 3:  HYPERLIPIDEMIA-MIXED (ICD-272.4) we'll obtain a lipid panel for our records as he states it has been sometime since it has been checked. We will forward these to Dr. Sullivan Lone when they are available. He would like to have this done at [facility] as this is where he has his insurance. His updated medication list for this problem includes:    Simvastatin 40 Mg Tabs (Simvastatin) .Marland Kitchen... Take 1 tablet by mouth at bedtime  Orders: T-Lipid Profile ([id number]) T-Hepatic Function [id number])  His updated medication list for this problem includes:    Simvastatin 40 Mg Tabs (Simvastatin) .Marland Kitchen... Take 1 tablet by mouth at bedtime  Lipid Assessment/Plan:  Based on NCEP/ATP III, the patient's risk factor category is "history of coronary disease, peripheral vascular disease, cerebrovascular disease, or aortic aneurysm".  The patient's lipid goals are as follows: Total cholesterol goal is 200; LDL cholesterol goal is 100; HDL cholesterol goal is 40; Triglyceride goal is 150.    Patient Instructions: 1)  Your physician recommends that you schedule a follow-up appointment in: 6 months 2)  Your physician recommends that you return for a FASTING lipid profile: as soon as possible 3)  Your physician has recommended you make the following change in your medication: start renexa 500mg  twice a day for 2 days then 100 mg twice a day.  If not working after 1 week, stop medication. Restart imdur 30mg  1/4-1/2 tab daily  Appended Document:  ROV/AMD On coumadin for hx of CVA

## 2010-05-03 NOTE — Assessment & Plan Note (Signed)
Summary: F6M/AMD   Visit Type:  Follow-up Referring Provider:  Dr. Gala Romney Primary Provider:  Julieanne Manson MD  CC:  chest pain.  History of Present Illness: Kevin [name] is a 55 y/o male with h/o CAD, atrial fib, HTN, HL, OSA and previous CVA. He underwent CABG with Maze and PFO closure in February 2010.  10/2008 he had a cath for exertional dyspnea and CP. Cath with 2-V CAD. LAD 95% mid, LCX small RCA large OK.  SVG -> OM1 and OM2 was occluded. SVG-Diag was widely patent and backfilled the LAD well. LIMA - LAD was atretic. Myoview to assess LAD for ischemia and there was very mild reversible defect in very distal anterior wall and apex. Decision made to manage him medically.  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 He described it as a pressure, in a rectangle box in his mediastinum that was quite severe. He stopped work, got off his tractor and he believes the discomfort lasted for some time. He felt tired for the next couple of days. He's not had any further episodes of he's starting to get back to work and is concerned that he may have more episodes of discomfort. He did not take nitroglycerin sublingual for this discomfort and is not carrying it with him today but normally he does.  He has not been exercising although his wife does work for cardiac rehabilitation at [facility]. He recently purchased a treadmill.  EKG shows normal sinus rhythm with rate 59 beats per minute, no significant ST or T wave changes.  Current Medications (verified): 1)  Metoprolol Tartrate 50 Mg Tabs (Metoprolol Tartrate) .... Take One Tablet By Mouth Twice A Day 2)  Coumadin 5 Mg Tabs (Warfarin Sodium) .... As Directed By Anticoagulation Clinic 3)  Aspirin 81 Mg Tbec (Aspirin) .... Take One Tablet By Mouth Daily 4)  Lexapro 20 Mg Tabs (Escitalopram Oxalate) .... Take 1 By Mouth Once Daily 5)  Alprazolam 0.5 Mg Xr24h-Tab (Alprazolam) .... Take As Directed As  Needed 6)  Zantac 300 Mg Tabs (Ranitidine Hcl) .... Take 1 By Mouth At Bedtime 7)  Ranexa 1000 Mg Xr12h-Tab (Ranolazine) .... Take One Tablet By Mouth Twice A Day 8)  Isosorbide Mononitrate Cr 30 Mg Xr24h-Tab (Isosorbide Mononitrate) .... Take 1/2 Tablet By Mouth Daily 9)  Crestor 10 Mg Tabs (Rosuvastatin Calcium) .... Take One Tablet By Mouth Daily.  Allergies (verified): 1)  ! * Strawberries  Past History:  Past Medical History: Last updated: 09/17/2008 CAD, UNSPECIFIED SITE (ICD-414.00) s/p CABG ATRIAL FIBRILLATION (ICD-427.31) s/p Maze OVERWEIGHT/OBESITY (ICD-278.02) HYPERLIPIDEMIA-MIXED (ICD-272.4) PATENT FORAMEN OVALE (ICD-745.5) s/p closure SLEEP APNEA, OBSTRUCTIVE (ICD-327.23) THROMBOCYTOPENIA (ICD-287.5)    Family History: Last updated: 09/29/2009 Family History of Coronary Artery Disease:  Family History of Hyperlipidemia:  Family History of Hypertension:  Family History of Diabetes: Sister  Social History: Last updated: 08/11/2008 Full Time Married  Tobacco Use - No.  Alcohol Use - no  Risk Factors: Smoking Status: never (08/11/2008)  Past Surgical History: CABG x 4  by Kerin Perna, M.D 05/12/08 Gallbladder removed: Aug 19, 2009  Family History: Family History of Coronary Artery Disease:  Family History of Hyperlipidemia:  Family History of Hypertension:  Family History of Diabetes: Sister  Review of Systems       The patient complains of chest pain.  The patient denies fever, weight loss, weight gain, vision loss, decreased hearing, hoarseness, syncope, dyspnea on exertion, peripheral edema, prolonged cough, abdominal pain,  incontinence, muscle weakness, depression, and enlarged lymph nodes.    Vital Signs:  Patient profile:   55 year old male Height:      66 inches Weight:      181 pounds Pulse rate:   65 / minute BP sitting:   100 / 63  (left arm) Cuff size:   regular  Vitals Entered By: Bishop Dublin, CMA (September 29, 2009 10:21  AM)  Physical Exam  General:  Well developed, well nourished, in no acute distress. Head:  normocephalic and atraumatic Neck:  Neck supple, no JVD. No masses, thyromegaly or abnormal cervical nodes. Lungs:  Clear bilaterally to auscultation and percussion. Heart:  Non-displaced PMI, chest non-tender; regular rate and rhythm, S1, S2 without murmurs, rubs or gallops. Carotid upstroke normal, no bruit.  Pedals normal pulses. No edema, no varicosities. Abdomen:  Bowel sounds positive; abdomen soft and non-tender without masses Msk:  Back normal, normal gait. Muscle strength and tone normal. Pulses:  pulses normal in all 4 extremities Extremities:  No clubbing or cyanosis. Neurologic:  Alert and oriented x 3. Skin:  Intact without lesions or rashes. Psych:  Normal affect.   Impression & Recommendations:  Problem # 1:  CHEST TIGHTNESS-PRESSURE-OTHER ([id number]) recent episode of chest discomfort in a gentleman with known severe coronary artery disease, bypass surgery last year with an occluded vein graft to the OM 2, atretic LIMA to the LAD that backfills from a vein graft to a diagonal. Mild ischemia of the distal LAD territory noted on stress test.  He is concerned about his chest pain. He would like to keep working and was concerned that any exertion may contribute to more chest discomfort. We will relook at his cardiac catheter films and talk with some of the interventional physicians to determine if his LAD could be intervened upon it he continues to have worsening episodes of chest discomfort.  We have not made any medication changes at this time as his blood pressure and heart rate are borderline low. I think her symptoms start walking more poor conditioning as he does not do very much currently.  Problem # 2:  ATRIAL FIBRILLATION (ICD-427.31)  he is maintaining sinus rhythm. On warfarin and aspirin. Heart rate is well controlled on beta blockers.  His updated medication list for  this problem includes:    Metoprolol Tartrate 50 Mg Tabs (Metoprolol tartrate) .Marland Kitchen... Take one tablet by mouth twice a day    Coumadin 5 Mg Tabs (Warfarin sodium) .Marland Kitchen... As directed by anticoagulation clinic    Aspirin 81 Mg Tbec (Aspirin) .Marland Kitchen... Take one tablet by mouth daily  Orders: EKG w/ Interpretation ([id number])  His updated medication list for this problem includes:    Metoprolol Tartrate 50 Mg Tabs (Metoprolol tartrate) .Marland Kitchen... Take one tablet by mouth twice a day    Coumadin 5 Mg Tabs (Warfarin sodium) .Marland Kitchen... As directed by anticoagulation clinic    Aspirin 81 Mg Tbec (Aspirin) .Marland Kitchen... Take one tablet by mouth daily  Problem # 3:  HYPERLIPIDEMIA-MIXED (ICD-272.4)  We have changed him to Crestor given any restrictions on simvastatin and as he was not at goal with LDL less than 70. We will recheck his cholesterol in several months time.  His updated medication list for this problem includes:    Crestor 10 Mg Tabs (Rosuvastatin calcium) .Marland Kitchen... Take one tablet by mouth daily.  His updated medication list for this problem includes:    Crestor 10 Mg Tabs (Rosuvastatin calcium) .Marland Kitchen... Take one tablet by mouth daily.  Problem # 4:  CAD, ARTERY BYPASS GRAFT (ICD-414.04)  Known disease as detailed above. I suspect he is having angina. We have encouraged him to take his nitroglycerin for his chest pain and to contact us if he has additional episodes of discomfort.  His updated medication list for this problem includes:    Metoprolol Tartrate 50 Mg Tabs (Metoprolol tartrate) .Marland Kitchen... Take one tablet by mouth twice a day    Coumadin 5 Mg Tabs (Warfarin sodium) .Marland Kitchen... As directed by anticoagulation clinic    Aspirin 81 Mg Tbec (Aspirin) .Marland Kitchen... Take one tablet by mouth daily    Ranexa 1000 Mg Xr12h-tab (Ranolazine) .Marland Kitchen... Take one tablet by mouth twice a day    Isosorbide Mononitrate Cr 30 Mg Xr24h-tab (Isosorbide mononitrate) .Marland Kitchen... Take 1 tablet by mouth daily    Nitrostat 0.4 Mg Subl (Nitroglycerin) .Marland Kitchen...  1 tablet under tongue at onset of chest pain; you may repeat every 5 minutes for up to 3 doses.  His updated medication list for this problem includes:    Metoprolol Tartrate 50 Mg Tabs (Metoprolol tartrate) .Marland Kitchen... Take one tablet by mouth twice a day    Coumadin 5 Mg Tabs (Warfarin sodium) .Marland Kitchen... As directed by anticoagulation clinic    Aspirin 81 Mg Tbec (Aspirin) .Marland Kitchen... Take one tablet by mouth daily    Ranexa 1000 Mg Xr12h-tab (Ranolazine) .Marland Kitchen... Take one tablet by mouth twice a day    Isosorbide Mononitrate Cr 30 Mg Xr24h-tab (Isosorbide mononitrate) .Marland Kitchen... Take 1 tablet by mouth daily    Nitrostat 0.4 Mg Subl (Nitroglycerin) .Marland Kitchen... 1 tablet under tongue at onset of chest pain; you may repeat every 5 minutes for up to 3 doses. Prescriptions: NITROSTAT 0.4 MG SUBL (NITROGLYCERIN) 1 tablet under tongue at onset of chest pain; you may repeat every 5 minutes for up to 3 doses.  #25 x 3   Entered by:   Benedict Needy, RN   Authorized by:   Dossie Arbour MD   Signed by:   Benedict Needy, RN on 09/29/2009   Method used:   Printed then faxed to ...       [employer] [employer] (mail-order)       [address] [place], [place]  [address]       Ph: [phone]       Fax: [phone]   RxID:   [id number]

## 2010-05-03 NOTE — Medication Information (Signed)
Summary: Tax adviser   Imported By: West Carbo 10/12/2009 09:52:11  _____________________________________________________________________  External Attachment:    Type:   Image     Comment:   External Document

## 2010-05-03 NOTE — Miscellaneous (Signed)
Summary: Heart Track Order  Heart Track Order   Imported By: Harlon Flor 11/11/2009 10:00:10  _____________________________________________________________________  External Attachment:    Type:   Image     Comment:   External Document

## 2010-05-03 NOTE — Progress Notes (Signed)
Summary: Refill on Coumadin  Phone Note Refill Request Message from:  Patient on June 29, 2009 10:08 AM  Refills Requested: Medication #1:  COUMADIN 2 MG TABS as directed Patient was getting refills from neurologist, neurologist asked since we were managing coumadin could we take over doing the refills.  Patient is out and needs this called in today; [employer] IT sales professional in [place]  Initial call taken by: West Carbo,  June 29, 2009 10:09 AM    New/Updated Medications: COUMADIN 5 MG TABS (WARFARIN SODIUM) as directed by anticoagulation clinic Prescriptions: COUMADIN 2 MG TABS (WARFARIN SODIUM) as directed  #60 x 3   Entered by:   Charlena Cross, RN, BSN   Authorized by:   Dossie Arbour MD   Signed by:   Charlena Cross, RN, BSN on 06/29/2009   Method used:   Faxed to ...       [employer] [employer] (mail-order)       [address] [place], [place]  [address]       Ph: [phone]       Fax: [phone]   RxID:   [id number]

## 2010-05-03 NOTE — Medication Information (Signed)
Summary: Coumadin Clinic  Anticoagulant Therapy  Managed by: Inactive Referring MD: Valera Castle PCP: Julieanne Manson MD Supervising MD: Mariah Milling Indication 1: Atrial Fibrillation (chronic) Indication 2: Cerebrovascular Accident Lab Used: [facility] Site: [place] INR POC 1.1 INR RANGE 2.0-3.0      Any changes in medication regimen? yes       Details: START plavix     Comments: Per Dr. Mariah Milling D/C coumadin  Allergies: 1)  ! * Strawberries  Anticoagulation Management History:      Negative risk factors for bleeding include an age less than 43 years old.  The bleeding index is 'low risk'.  Negative CHADS2 values include Age > 8 years old.  The start date was 11/30/2005.  His last INR was 2.19.  Anticoagulation responsible provider: Gollan.  INR POC: 1.1.  Exp: 12/2010.    Anticoagulation Management Assessment/Plan:      The target INR is 2.5-3.0.  The next INR is due 11/02/2009.  Anticoagulation instructions were given to patient.  Results were reviewed/authorized by Inactive.         Prior Anticoagulation Instructions: INR 3.4  Start taking 1/2 tablet daily except 1 tablet on Mondays and Fridays.  Recheck in 3 weeks.    Current Anticoagulation Instructions: Pt is going to stop coumadin and increase ASA 325  Appended Document: Coumadin Clinic    Clinical Lists Changes  Medications: Added new medication of PLAVIX 75 MG TABS (CLOPIDOGREL BISULFATE) Take 1 tablet by mouth once a day      Appended Document: Coumadin Clinic No recent A-fib S/P maze and LA appendage closure with surgery. Now on ASA and plavix after PCI with stent x2. Will hold warfarin,  Change plavix to effient and increase ASA to 325 mg daily

## 2010-05-03 NOTE — Medication Information (Signed)
Summary: [facility]  Anticoagulant Therapy  Managed by: Robyn Haber, RN, BSN Referring MD: Valera Castle PCP: Julieanne Manson MD Supervising MD: Gala Romney MD, Reuel Boom Indication 1: Atrial Fibrillation (chronic) Indication 2: Cerebrovascular Accident Lab Used: [facility] Site: [place] INR POC 2.2  Dietary changes: no    Health status changes: no    Bleeding/hemorrhagic complications: no    Recent/future hospitalizations: no    Any changes in medication regimen? no    Recent/future dental: no  Any missed doses?: no       Is patient compliant with meds? yes       Allergies: 1)  ! * Strawberries  Anticoagulation Management History:      The patient is taking warfarin and comes in today for a routine follow up visit.  Negative risk factors for bleeding include an age less than 56 years old.  The bleeding index is 'low risk'.  Negative CHADS2 values include Age > 56 years old.  The start date was 11/30/2005.  His last INR was 1.9.  Anticoagulation responsible provider: Shoshannah Faubert MD, Reuel Boom.  INR POC: 2.2.    Anticoagulation Management Assessment/Plan:      The patient's current anticoagulation dose is Coumadin 2 mg tabs: as directed.  The target INR is 2.5-3.0.  The next INR is due 05/19/2009.  Anticoagulation instructions were given to patient.  Results were reviewed/authorized by Robyn Haber, RN, BSN.  He was notified by Charlena Cross, RN, BSN.         Prior Anticoagulation Instructions: The patient is to continue with the same dose of coumadin.  This dosage includes: 2.5mg  everyday, 5mg  M, W, F  Current Anticoagulation Instructions: coumadin 5 mg today then resume coumadin 2.5 mg daily with 5 mg on MWF

## 2010-05-03 NOTE — Medication Information (Signed)
Summary: CCR/AMD  Anticoagulant Therapy  Managed by: Cloyde Reams, RN, BSN Referring MD: Valera Castle PCP: Julieanne Manson MD Supervising MD: Mariah Milling Indication 1: Atrial Fibrillation (chronic) Indication 2: Cerebrovascular Accident Lab Used: [facility] Site: [place] INR POC 2.5 INR RANGE 2.0-3.0    Bleeding/hemorrhagic complications: no     Any changes in medication regimen? yes       Details: prn pain meds using minimally.   Any missed doses?: no        Comments: Emergent Gallbladder removal in [place].   Allergies: 1)  ! * Strawberries  Anticoagulation Management History:      The patient is taking warfarin and comes in today for a routine follow up visit.  Negative risk factors for bleeding include an age less than 56 years old.  The bleeding index is 'low risk'.  Negative CHADS2 values include Age > 56 years old.  The start date was 11/30/2005.  His last INR was 1.9.  Anticoagulation responsible provider: Gollan.  INR POC: 2.5.  Cuvette Lot#: [id number].  Exp: 11/2010.    Anticoagulation Management Assessment/Plan:      The patient's current anticoagulation dose is Coumadin 5 mg tabs: as directed by anticoagulation clinic.  The target INR is 2.5-3.0.  The next INR is due 09/05/2009.  Anticoagulation instructions were given to patient.  Results were reviewed/authorized by Cloyde Reams, RN, BSN.  He was notified by Cloyde Reams RN.         Prior Anticoagulation Instructions: INR 2.5  Continue on same dosage 1/2 tablet daily except 1 tablet on Mondays, Wednesdays, and Fridays.  Rechek in 4 weeks.    Current Anticoagulation Instructions: INR 2.5  Continue on same dosage 1/2 tablet daily except 1 tablet on Mondays, Wednesdays, and Fridays.  Recheck in 2 weeks.

## 2010-05-03 NOTE — Progress Notes (Signed)
Summary: Chest pain  Phone Note Call from Patient Call back at Home Phone [phone]   Caller: patient wife [name]) Details for Reason: c/o chest pain x two episodes on Monday and Tues. p.m. The pain he descriibes is in the mid-sternum.  He has not taken any NTG. Summary of Call: c/o chest pain x 2 episodes on Monday and Tues. p.m.  He failed to take a NTG tablet.  He is taking all other medications.  He said the pain was in the mid-sternum.  Do you want to see sooner than next week.  Very concerned due to new stent placement. Initial call taken by: Bishop Dublin, CMA,  November 16, 2009 10:22 AM  Follow-up for Phone Call        Is this the same pain that he had before the stent. Does pain come on the rest?  Pain concerning that it might be smal vessel disease that we can not fix as he just had stent placed (should still be ok). Would take NTG for pain if significant. Is blood pressure high enough so that we could increase imdur to 30 mg two times a day?      Appended Document: Chest pain pt will try taking the imdur two times a day.   Clinical Lists Changes  Medications: Changed medication from ISOSORBIDE MONONITRATE CR 30 MG XR24H-TAB (ISOSORBIDE MONONITRATE) Take 1 tablet by mouth daily to ISOSORBIDE MONONITRATE CR 30 MG XR24H-TAB (ISOSORBIDE MONONITRATE) Take 1 tablet by mouth two times a day

## 2010-05-03 NOTE — Cardiovascular Report (Signed)
Summary: Order  Order   Imported By: Harlon Flor 12/09/2009 16:42:40  _____________________________________________________________________  External Attachment:    Type:   Image     Comment:   External Document

## 2010-05-03 NOTE — Letter (Signed)
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 [address]   [place], [place] [address]   Phone: [phone]  Fax: [phone]    11/23/2009  TO: Leodis Sias IT MAY CONCERN   RE: Kevin [name] [address] [id number]   The above named individual is under my medical care and may return to work: December 19,2011 after he is seen by me.   If you have any further questions or need additional information, please call.     Sincerely,      Dossie Arbour, MD

## 2010-05-03 NOTE — Progress Notes (Signed)
Summary: RX isosorbide mono  Phone Note Refill Request Call back at Home Phone [phone] Message from:  WIFE on December 20, 2009 11:36 AM  Refills Requested: Medication #1:  ISOSORBIDE MONONITRATE CR 30 MG XR24H-TAB Take 1 tablet by mouth two times a day   Notes: SHOULD BE 2 TIMES A DAY [facility] [phone]  Initial call taken by: Harlon Flor,  December 20, 2009 11:36 AM    Prescriptions: ISOSORBIDE MONONITRATE CR 30 MG XR24H-TAB (ISOSORBIDE MONONITRATE) Take 1 tablet by mouth two times a day  #60 x 6   Entered by:   Bishop Dublin, CMA   Authorized by:   Dossie Arbour MD   Signed by:   Bishop Dublin, CMA on 12/20/2009   Method used:   Electronically to        [facility] (retail)       [address]       [place], [place]  [address]       Ph: [phone]       Fax: [phone]   RxID:   [id number]

## 2010-05-03 NOTE — Progress Notes (Signed)
  Phone Note Outgoing Call   Call placed by: Benedict Needy, RN,  September 30, 2009 11:46 AM Call placed to: Patient Summary of Call: Calling pt per Dr. Windell Hummingbird request.  If chest pain persists pt could have a procedure at [facility].  Dr. Mariah Milling wanted to to make pt aware of his options. LM with wife to call back.  Initial call taken by: Benedict Needy, RN,  September 30, 2009 11:47 AM  Follow-up for Phone Call        Spoke with wife about pt's options. She also reported pt taking imdur in the am and having headache.  She said that they have since started cutting the pill in half and taking it before bed.  I asked her to talk to her pharmacist about spliting that pill because it is and extended release, but that taking it before bed is probably a good idea. She will talk to her husband and call our office if he continues to have chest pain.  Follow-up by: Benedict Needy, RN,  September 30, 2009 12:02 PM

## 2010-05-03 NOTE — Medication Information (Signed)
Summary: Coumadin Clinic  Anticoagulant Therapy  Managed by: Robyn Haber, RN, BSN Referring MD: Valera Castle PCP: Julieanne Manson MD Supervising MD: Mariah Milling Indication 1: Atrial Fibrillation (chronic) Indication 2: Cerebrovascular Accident Lab Used: [facility] Site: [place] INR POC 2.1 INR RANGE 2.0-3.0  Dietary changes: no    Health status changes: no    Bleeding/hemorrhagic complications: no    Recent/future hospitalizations: no    Any changes in medication regimen? no    Recent/future dental: no  Any missed doses?: no       Is patient compliant with meds? yes       Allergies: 1)  ! * Strawberries  Anticoagulation Management History:      The patient is taking warfarin and comes in today for a routine follow up visit.  Negative risk factors for bleeding include an age less than 56 years old.  The bleeding index is 'low risk'.  Negative CHADS2 values include Age > 36 years old.  The start date was 11/30/2005.  His last INR was 1.9.  Anticoagulation responsible provider: [facility].  INR POC: 2.1.    Anticoagulation Management Assessment/Plan:      The patient's current anticoagulation dose is Coumadin 2 mg tabs: as directed.  The target INR is 2.5-3.0.  The next INR is due 06/10/2009.  Anticoagulation instructions were given to patient.  Results were reviewed/authorized by Robyn Haber, RN, BSN.  He was notified by Charlena Cross, RN, BSN.         Prior Anticoagulation Instructions: coumadin 5 mg today then resume coumadin 2.5 mg daily with 5 mg on MWF  Current Anticoagulation Instructions: The patient is to continue with the same dose of coumadin.  This dosage includes: coumadin 2.5 mg daily with 5 mg on M W F

## 2010-05-03 NOTE — Progress Notes (Signed)
Summary: RX  Phone Note Refill Request Call back at Home Phone [phone] Message from:  Patient on May 25, 2009 10:02 AM  Refills Requested: Medication #1:  RANEXA 1000 MG XR12H-TAB Take one tablet by mouth twice a day [facility] [phone]  Initial call taken by: Harlon Flor,  May 25, 2009 10:02 AM    Prescriptions: RANEXA 1000 MG XR12H-TAB (RANOLAZINE) Take one tablet by mouth twice a day  #60 x 3   Entered by:   Mercer Pod   Authorized by:   Dossie Arbour MD   Signed by:   Mercer Pod on 05/26/2009   Method used:   Faxed to ...       [facility] (mail-order)       [address] [place], [place]  [address]       Ph: [phone]       Fax: [phone]   RxID:   [id number]

## 2010-05-04 NOTE — Assessment & Plan Note (Signed)
Summary: F/U 6 MONTHS/NE   Visit Type:  Follow-up Referring Provider:  Dr. Gala Romney Primary Provider:  Julieanne Manson MD  CC:  c/o mid-sternum pain and shortness of breath at times..  History of Present Illness: [name] is a 55 y/o male with h/o CAD, atrial fib, HTN, HL, OSA and previous CVA,  CABG with Maze and PFO closure in February 2010,  cath in 10/2008  for exertional dyspnea and CP showing 2-V CAD. LAD 95% mid, LCX small RCA large OK.  SVG -> OM1 and OM2 was occluded. SVG-Diag was widely patent and backfilled the LAD well. LIMA - LAD was atretic. Myoview to assess LAD for ischemia with very mild reversible defect in very distal anterior wall and apex. Decision made to manage him medically, continued chest pain and cath Aug 9th 2011 with PCI of the LAD with 2 stents. he presents for routine followup.  he has been participating in cardiac rehabilitation 3 times per week though has not been in the past 2 weeks. Overall he has had significantly improved chest pain. He does report an episode of chest pain on Christmas day after he bent over and stood back up. He is uncertain if this was cardiac or musculoskeletal. He has been noticing low blood pressure on a regular basis with some episodes of dizziness.  isosorbide was increased to b.i.d. after his cardiac catheterization for chest discomfort with improvement of his symptoms.  EKG shows normal sinus rhythm with rate of 63 beats per minute, no significant ST or T wave changes.  Current Medications (verified): 1)  Metoprolol Tartrate 50 Mg Tabs (Metoprolol Tartrate) .... Take One Tablet By Mouth Twice A Day 2)  Aspirin Ec 325 Mg Tbec (Aspirin) .... Take One Tablet By Mouth Daily 3)  Lexapro 20 Mg Tabs (Escitalopram Oxalate) .... Take 1 By Mouth Once Daily 4)  Alprazolam 0.5 Mg Xr24h-Tab (Alprazolam) .... Take As Directed As Needed 5)  Zantac 300 Mg Tabs (Ranitidine Hcl) .... Take 1 By Mouth At Bedtime 6)  Ranexa 1000 Mg Xr12h-Tab  (Ranolazine) .... Take One Tablet By Mouth Twice A Day 7)  Isosorbide Mononitrate Cr 30 Mg Xr24h-Tab (Isosorbide Mononitrate) .... Take 1 Tablet By Mouth Two Times A Day 8)  Crestor 10 Mg Tabs (Rosuvastatin Calcium) .... Take One Tablet By Mouth Daily. 9)  Nitrostat 0.4 Mg Subl (Nitroglycerin) .Marland Kitchen.. 1 Tablet Under Tongue At Onset of Chest Pain; You May Repeat Every 5 Minutes For Up To 3 Doses. 10)  Effient 10 Mg Tabs (Prasugrel Hcl) .... One Tablet Once Daily 11)  Tylenol Extra Strength 500 Mg Tabs (Acetaminophen) .... As Needed  Allergies (verified): 1)  ! 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  Alcohol Use - no  Risk Factors: Smoking Status: never (08/11/2008)  Review of Systems       The patient complains of chest pain.  The patient denies fever, weight loss, weight gain, vision loss,  decreased hearing, hoarseness, syncope, dyspnea on exertion, peripheral edema, prolonged cough, abdominal pain, incontinence, muscle weakness, depression, and enlarged lymph nodes.         rare chest pain, rare dizziness  Vital Signs:  Patient profile:   55 year old male Height:      66 inches Weight:      173 pounds BMI:     28.02 Pulse rate:   63 / minute BP sitting:   92 / 64  (left  arm) Cuff size:   regular  Vitals Entered By: Bishop Dublin, CMA (April 04, 2010 11:53 AM)  Physical Exam  General:  Well developed, well nourished, in no acute distress. Head:  normocephalic and atraumatic Neck:  Neck supple, no JVD. No masses, thyromegaly or abnormal cervical nodes. Lungs:  Clear bilaterally to auscultation and percussion. Heart:  Non-displaced PMI, chest non-tender; regular rate and rhythm, S1, S2 without murmurs, rubs or gallops. Carotid upstroke normal, no bruit.  Pedals normal pulses. No edema, no varicosities. Abdomen:  Bowel sounds positive; abdomen soft and non-tender without masses Msk:  Back normal, normal gait. Muscle strength and tone normal. Pulses:  pulses normal in all 4 extremities Extremities:  No clubbing or cyanosis. Neurologic:  Alert and oriented x 3. Skin:  Intact without lesions or rashes. Psych:  Normal affect.   Impression & Recommendations:  Problem # 1:  CAD, ARTERY BYPASS GRAFT (ICD-414.04) stable symptoms. Rare episodes of chest pain with some atypical features. He has been exercising in cardiac rehabilitation without chest pain. Blood pressure is low and we will hold his evening dose of isosorbide.  His updated medication list for this problem includes:    Metoprolol Tartrate 50 Mg Tabs (Metoprolol tartrate) .Marland Kitchen... Take one tablet by mouth twice a day    Aspirin Ec 325 Mg Tbec (Aspirin) .Marland Kitchen... Take one tablet by mouth daily    Ranexa 1000 Mg Xr12h-tab (Ranolazine) .Marland Kitchen... Take one tablet by mouth twice a day    Isosorbide Mononitrate Cr 30 Mg Xr24h-tab (Isosorbide mononitrate) .Marland Kitchen... Take 1 tablet by mouth in am, take one tablet in pm as needed    Nitrostat 0.4 Mg Subl (Nitroglycerin) .Marland Kitchen... 1 tablet under tongue at onset of chest pain; you may repeat every 5 minutes for up to 3 doses.    Effient 10 Mg Tabs (Prasugrel hcl) ..... One tablet once daily  Orders: EKG w/ Interpretation (93000)  Problem # 2:  CHEST TIGHTNESS-PRESSURE-OTHER  ([id number]) Continue current medications with change of Imdur q. daily instead of b.i.d.. If he has recurrence of chest pain, we may need to go back to higher dose or take second dose of Imdur p.r.n..  Problem # 3:  HYPERLIPIDEMIA-MIXED (ICD-272.4) last cholesterol was above goal. We changed him to Crestor 10 mg daily. We have asked him to recheck his cholesterol and LFTs at [facility].  His updated medication list for this problem includes:    Crestor 10 Mg Tabs (Rosuvastatin calcium) .Marland Kitchen... Take one tablet by mouth daily.  Orders: T-Lipid Profile [id number]) T-Hepatic Function [phone])  Patient Instructions: 1)  Your physician recommends that you schedule a follow-up appointment in: 6 months 2)  Your physician recommends that you return for a FASTING lipid profile: To be done at [facility] (Lipid/LFT) 3)  Your physician has recommended you make the following change in your medication: DECREASE Isosorbide to once daily in AM and PM only as needed  Prescriptions: ISOSORBIDE MONONITRATE CR 30 MG XR24H-TAB (ISOSORBIDE MONONITRATE) Take 1 tablet by mouth in  AM, take one tablet in PM as needed  #30 x 6   Entered by:   Lanny Hurst RN   Authorized by:   Dossie Arbour MD   Signed by:   Lanny Hurst RN on 04/04/2010   Method used:   Faxed to ...       [facility] (mail-order)       [address] [place], [place]  [address]       Ph: [phone]       Fax: [phone]   RxID:   [id number]

## 2010-05-04 NOTE — Progress Notes (Signed)
Summary: RX  Phone Note Refill Request Call back at Home Phone [phone] Message from:  Patient on March 31, 2010 1:59 PM  Refills Requested: Medication #1:  RANEXA 1000 MG XR12H-TAB Take one tablet by mouth twice a day [facility]  Initial call taken by: Harlon Flor,  March 31, 2010 1:59 PM    Prescriptions: RANEXA 1000 MG XR12H-TAB (RANOLAZINE) Take one tablet by mouth twice a day  #60 x 3   Entered by:   Lysbeth Galas CMA   Authorized by:   Dossie Arbour MD   Signed by:   Lysbeth Galas CMA on 03/31/2010   Method used:   Electronically to        [facility] (retail)       [address]       [place], [place]  [address]       Ph: [phone]       Fax: [phone]   RxID:   [id number]

## 2010-06-01 ENCOUNTER — Encounter: Payer: Self-pay | Admitting: Cardiovascular Disease

## 2010-06-16 ENCOUNTER — Other Ambulatory Visit: Payer: Self-pay | Admitting: Cardiovascular Disease

## 2010-06-16 ENCOUNTER — Encounter: Payer: Self-pay | Admitting: Cardiovascular Disease

## 2010-06-16 LAB — CBC
MCH: 31 pg (ref 26.0–34.0)
MCHC: 34.9 g/dL (ref 30.0–36.0)
MCV: 88.8 fL (ref 78.0–100.0)
Platelets: 156 10*3/uL (ref 150–400)
RBC: 4.19 MIL/uL — ABNORMAL LOW (ref 4.22–5.81)
RDW: 13.1 % (ref 11.5–15.5)

## 2010-06-16 LAB — BASIC METABOLIC PANEL
BUN: 9 mg/dL (ref 6–23)
CO2: 29 mEq/L (ref 19–32)
Calcium: 8.8 mg/dL (ref 8.4–10.5)
Chloride: 106 mEq/L (ref 96–112)
Creatinine, Ser: 1.24 mg/dL (ref 0.4–1.5)
GFR calc Af Amer: >60 mL/min (ref >60)
Glucose, Bld: 93 mg/dL (ref 70–99)

## 2010-06-16 LAB — PROTIME-INR: Prothrombin Time: 14.9 seconds (ref 11.6–15.2)

## 2010-06-21 ENCOUNTER — Ambulatory Visit (INDEPENDENT_AMBULATORY_CARE_PROVIDER_SITE_OTHER): Payer: No Typology Code available for payment source | Admitting: Cardiovascular Disease

## 2010-06-21 ENCOUNTER — Encounter: Payer: Self-pay | Admitting: Cardiovascular Disease

## 2010-06-21 DIAGNOSIS — R079 Chest pain, unspecified: Secondary | ICD-10-CM

## 2010-06-21 DIAGNOSIS — E785 Hyperlipidemia, unspecified: Secondary | ICD-10-CM

## 2010-06-21 DIAGNOSIS — I6350 Cerebral infarction due to unspecified occlusion or stenosis of unspecified cerebral artery: Secondary | ICD-10-CM

## 2010-06-21 DIAGNOSIS — I25810 Atherosclerosis of coronary artery bypass graft(s) without angina pectoris: Secondary | ICD-10-CM

## 2010-06-21 DIAGNOSIS — I639 Cerebral infarction, unspecified: Secondary | ICD-10-CM

## 2010-06-21 DIAGNOSIS — I2510 Atherosclerotic heart disease of native coronary artery without angina pectoris: Secondary | ICD-10-CM

## 2010-06-21 DIAGNOSIS — E663 Overweight: Secondary | ICD-10-CM

## 2010-06-21 NOTE — Patient Instructions (Addendum)
Would continue heart track . Discuss medication options for sleep. Try to work on decreasing stress. We will call you when your disability paperwork is complete Your physician recommends that you schedule a follow-up appointment in: 6 months, but call earlier if pain gets worse.

## 2010-06-21 NOTE — Assessment & Plan Note (Signed)
We have encouraged continued exercise, careful diet management in an effort to lose weight. 

## 2010-06-21 NOTE — Progress Notes (Signed)
Patient ID: Kevin [name], male    DOB: [birth year], 55 y.o.   MRN: [id number]  HPI Kevin [name] is a 56 y/o male with h/o CAD, atrial fib, HTN, HL, OSA and previous CVA,  CABG with Maze and PFO closure in February 2010,  cath in 10/2008  for exertional dyspnea and CP showing 2-V CAD. LAD 95% mid, LCX small RCA large OK.  SVG -> OM1 and OM2 was occluded. SVG-Diag was widely patent and backfilled the LAD well. LIMA - LAD was atretic. Myoview to assess LAD for ischemia with very mild reversible defect in very distal anterior wall and apex. Decision made to manage him medically, continued chest pain and cath Aug 9th 2011 with PCI of the LAD with 2 stents placed. he presents for routine followup.  On his last clinic visit, he reports that he had no significant chest pain. The 2 stents had significantly improved his chronic symptoms. Over the past several months, he has had worsening symptoms of chest pain. He describes it as a sharp pain in the center of his chest. Sometimes it comes at rest, sometimes with exertion. He has been able to do cardiac rehabilitation though has not been exercising on a regular basis. 2 weeks ago, he was able to walk on a treadmill without any symptoms. Sometimes he describes it as a dull achy feeling. He also reports one episode of severe discomfort after eating that was debilitating. His wife noted his discomfort and described him as having his head upwards, arms outstretched at his side until pain resolved.   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 He has memory problems from his old stroke and has difficulty maintaining the day-to-day activity and particularly that he had previously. He is forgetful, and cannot remember what he does during the daytime. His wife thinks that he should go out on disability as he is not functioning cognitively and has not been able to exert himself and perform the heavy physical activities required by his job  secondary to chest pain.  Previous attempts to increase his isosorbide has caused dizziness.  EKG shows normal sinus rhythm with rate of 63 beats per minute, incomplete right bundle-branch block, left axis deviation   Review of Systems  Constitutional: Negative.   HENT: Negative.   Eyes: Negative.   Respiratory: Negative.   Cardiovascular: Positive for chest pain. Negative for palpitations and leg swelling.  Gastrointestinal: Negative.   Musculoskeletal: Negative.   Skin: Negative.   Neurological: Negative.   Hematological: Negative.   Psychiatric/Behavioral: Positive for sleep disturbance and dysphoric mood. Negative for behavioral problems and confusion.       Memory problems     BP 103/69  Pulse 63  Ht 5\' 6"  (1.676 m)  Wt 174 lb 12.8 oz (79.289 kg)  BMI 28.21 kg/m2  Physical Exam  Nursing note and vitals reviewed. Constitutional: He is oriented to person, place, and time. He appears well-developed and well-nourished.  HENT:  Head: Normocephalic.  Nose: Nose normal.  Mouth/Throat: Oropharynx is clear and moist.  Eyes: Conjunctivae are normal. Pupils are equal, round, and reactive to light.  Neck: Normal range of motion. Neck supple. No JVD present.  Cardiovascular: Normal rate, regular rhythm, normal heart sounds and intact distal pulses.  Exam reveals no gallop and no friction rub.   No murmur heard. Pulmonary/Chest: Effort normal and breath sounds normal. No respiratory distress. He has no wheezes. He has no  rales. He exhibits no tenderness.  Abdominal: Soft. Bowel sounds are normal. He exhibits no distension. There is no tenderness.  Musculoskeletal: Normal range of motion. He exhibits no edema and no tenderness.  Lymphadenopathy:    He has no cervical adenopathy.  Neurological: He is alert and oriented to person, place, and time. Coordination normal.  Skin: Skin is warm and dry. No rash noted. No erythema.  Psychiatric: He has a normal mood and affect. His  behavior is normal. Judgment and thought content normal.     Assessment and Plan

## 2010-06-21 NOTE — Assessment & Plan Note (Signed)
He now has recurrence of his chest pain. It is somewhat atypical in nature. We have recommended he continue aggressive cardiopulmonary therapy and regular exercise. He needs to improve his sleep, decrease his stress. If his symptoms get worse, we may be forced to repeat a cardiac catheterization. He is going to apply for disability given his continued chest pain and underlying memory problems.

## 2010-06-21 NOTE — Assessment & Plan Note (Signed)
History of previous stroke with residual memory deficits. He is seen by Dr. Sandria Manly.

## 2010-06-21 NOTE — Assessment & Plan Note (Signed)
Cholesterol is at goal on the current lipid regimen. No changes to the medications were made.  

## 2010-07-02 ENCOUNTER — Encounter: Payer: Self-pay | Admitting: Cardiovascular Disease

## 2010-07-13 ENCOUNTER — Telehealth: Payer: Self-pay | Admitting: Internal Medicine

## 2010-07-14 ENCOUNTER — Telehealth: Payer: Self-pay | Admitting: *Deleted

## 2010-07-14 ENCOUNTER — Other Ambulatory Visit: Payer: Self-pay | Admitting: *Deleted

## 2010-07-14 NOTE — Telephone Encounter (Signed)
Refill called in to pharmacy

## 2010-07-18 LAB — CROSSMATCH
ABO/RH(D): A POS
Antibody Screen: NEGATIVE

## 2010-07-18 LAB — PREPARE FRESH FROZEN PLASMA

## 2010-07-18 LAB — CREATININE, SERUM
Creatinine, Ser: 0.99 mg/dL (ref 0.4–1.5)
Creatinine, Ser: 1.22 mg/dL (ref 0.4–1.5)
GFR calc Af Amer: >60 mL/min (ref >60)
GFR calc Af Amer: >60 mL/min (ref >60)
GFR calc non Af Amer: >60 mL/min (ref >60)
GFR calc non Af Amer: >60 mL/min (ref >60)

## 2010-07-18 LAB — CBC
HCT: 34.3 % — ABNORMAL LOW (ref 39.0–52.0)
HCT: 35.9 % — ABNORMAL LOW (ref 39.0–52.0)
HCT: 36 % — ABNORMAL LOW (ref 39.0–52.0)
HCT: 37 % — ABNORMAL LOW (ref 39.0–52.0)
HCT: 37.5 % — ABNORMAL LOW (ref 39.0–52.0)
HCT: 39.9 % (ref 39.0–52.0)
Hemoglobin: 11.9 g/dL — ABNORMAL LOW (ref 13.0–17.0)
Hemoglobin: 12.4 g/dL — ABNORMAL LOW (ref 13.0–17.0)
Hemoglobin: 12.5 g/dL — ABNORMAL LOW (ref 13.0–17.0)
Hemoglobin: 12.9 g/dL — ABNORMAL LOW (ref 13.0–17.0)
Hemoglobin: 13 g/dL (ref 13.0–17.0)
Hemoglobin: 14.1 g/dL (ref 13.0–17.0)
MCHC: 34.4 g/dL (ref 30.0–36.0)
MCHC: 34.5 g/dL (ref 30.0–36.0)
MCHC: 34.6 g/dL (ref 30.0–36.0)
MCHC: 34.7 g/dL (ref 30.0–36.0)
MCHC: 34.8 g/dL (ref 30.0–36.0)
MCHC: 34.9 g/dL (ref 30.0–36.0)
MCV: 88.1 fL (ref 78.0–100.0)
MCV: 88.2 fL (ref 78.0–100.0)
MCV: 88.4 fL (ref 78.0–100.0)
MCV: 88.4 fL (ref 78.0–100.0)
MCV: 89.7 fL (ref 78.0–100.0)
MCV: 89.9 fL (ref 78.0–100.0)
MCV: 90.3 fL (ref 78.0–100.0)
Platelets: 105 10*3/uL — ABNORMAL LOW (ref 150–400)
Platelets: 111 10*3/uL — ABNORMAL LOW (ref 150–400)
Platelets: 119 10*3/uL — ABNORMAL LOW (ref 150–400)
Platelets: 93 10*3/uL — ABNORMAL LOW (ref 150–400)
Platelets: 99 10*3/uL — ABNORMAL LOW (ref 150–400)
RBC: 3.79 MIL/uL — ABNORMAL LOW (ref 4.22–5.81)
RBC: 3.8 MIL/uL — ABNORMAL LOW (ref 4.22–5.81)
RBC: 4.01 MIL/uL — ABNORMAL LOW (ref 4.22–5.81)
RBC: 4.06 MIL/uL — ABNORMAL LOW (ref 4.22–5.81)
RBC: 4.2 MIL/uL — ABNORMAL LOW (ref 4.22–5.81)
RBC: 4.24 MIL/uL (ref 4.22–5.81)
RBC: 4.45 MIL/uL (ref 4.22–5.81)
RBC: 4.63 MIL/uL (ref 4.22–5.81)
RDW: 13.1 % (ref 11.5–15.5)
RDW: 13.6 % (ref 11.5–15.5)
RDW: 14.1 % (ref 11.5–15.5)
RDW: 14.2 % (ref 11.5–15.5)
WBC: 10.6 10*3/uL — ABNORMAL HIGH (ref 4.0–10.5)
WBC: 11 10*3/uL — ABNORMAL HIGH (ref 4.0–10.5)
WBC: 11.1 10*3/uL — ABNORMAL HIGH (ref 4.0–10.5)
WBC: 11.7 10*3/uL — ABNORMAL HIGH (ref 4.0–10.5)
WBC: 12.1 10*3/uL — ABNORMAL HIGH (ref 4.0–10.5)
WBC: 5.5 10*3/uL (ref 4.0–10.5)
WBC: 7.1 10*3/uL (ref 4.0–10.5)
WBC: 7.9 10*3/uL (ref 4.0–10.5)

## 2010-07-18 LAB — POCT I-STAT 4, (NA,K, GLUC, HGB,HCT)
Glucose, Bld: 110 mg/dL — ABNORMAL HIGH (ref 70–99)
Glucose, Bld: 115 mg/dL — ABNORMAL HIGH (ref 70–99)
Glucose, Bld: 140 mg/dL — ABNORMAL HIGH (ref 70–99)
Glucose, Bld: 93 mg/dL (ref 70–99)
HCT: 22 % — ABNORMAL LOW (ref 39.0–52.0)
HCT: 25 % — ABNORMAL LOW (ref 39.0–52.0)
HCT: 36 % — ABNORMAL LOW (ref 39.0–52.0)
HCT: 37 % — ABNORMAL LOW (ref 39.0–52.0)
Hemoglobin: 12.2 g/dL — ABNORMAL LOW (ref 13.0–17.0)
Hemoglobin: 12.6 g/dL — ABNORMAL LOW (ref 13.0–17.0)
Hemoglobin: 7.8 g/dL — CL (ref 13.0–17.0)
Hemoglobin: 8.5 g/dL — ABNORMAL LOW (ref 13.0–17.0)
Potassium: 4.3 mEq/L (ref 3.5–5.1)
Potassium: 4.4 mEq/L (ref 3.5–5.1)
Potassium: 6.1 mEq/L — ABNORMAL HIGH (ref 3.5–5.1)
Sodium: 138 mEq/L (ref 135–145)
Sodium: 140 mEq/L (ref 135–145)
Sodium: 143 mEq/L (ref 135–145)

## 2010-07-18 LAB — GLUCOSE, CAPILLARY
Glucose-Capillary: 101 mg/dL — ABNORMAL HIGH (ref 70–99)
Glucose-Capillary: 111 mg/dL — ABNORMAL HIGH (ref 70–99)
Glucose-Capillary: 116 mg/dL — ABNORMAL HIGH (ref 70–99)
Glucose-Capillary: 142 mg/dL — ABNORMAL HIGH (ref 70–99)
Glucose-Capillary: 144 mg/dL — ABNORMAL HIGH (ref 70–99)
Glucose-Capillary: 86 mg/dL (ref 70–99)
Glucose-Capillary: 94 mg/dL (ref 70–99)
Glucose-Capillary: 95 mg/dL (ref 70–99)

## 2010-07-18 LAB — POCT I-STAT 3, ART BLOOD GAS (G3+)
Acid-Base Excess: 1 mmol/L (ref 0.0–2.0)
Acid-base deficit: 3 mmol/L — ABNORMAL HIGH (ref 0.0–2.0)
Bicarbonate: 19.3 mEq/L — ABNORMAL LOW (ref 20.0–24.0)
Bicarbonate: 21.9 mEq/L (ref 20.0–24.0)
Bicarbonate: 23.5 mEq/L (ref 20.0–24.0)
Bicarbonate: 24.3 mEq/L — ABNORMAL HIGH (ref 20.0–24.0)
Bicarbonate: 26.1 mEq/L — ABNORMAL HIGH (ref 20.0–24.0)
Bicarbonate: 26.8 mEq/L — ABNORMAL HIGH (ref 20.0–24.0)
O2 Saturation: 100 %
O2 Saturation: 90 %
Patient temperature: 35.3
Patient temperature: 36.8
Patient temperature: 37.3
Patient temperature: 37.7
TCO2: 20 mmol/L (ref 0–100)
TCO2: 23 mmol/L (ref 0–100)
TCO2: 26 mmol/L (ref 0–100)
TCO2: 26 mmol/L (ref 0–100)
TCO2: 27 mmol/L (ref 0–100)
pCO2 arterial: 38.7 mmHg (ref 35.0–45.0)
pCO2 arterial: 51.7 mmHg — ABNORMAL HIGH (ref 35.0–45.0)
pH, Arterial: 7.303 — ABNORMAL LOW (ref 7.350–7.450)
pH, Arterial: 7.313 — ABNORMAL LOW (ref 7.350–7.450)
pH, Arterial: 7.323 — ABNORMAL LOW (ref 7.350–7.450)
pO2, Arterial: 103 mmHg — ABNORMAL HIGH (ref 80.0–100.0)
pO2, Arterial: 122 mmHg — ABNORMAL HIGH (ref 80.0–100.0)
pO2, Arterial: 255 mmHg — ABNORMAL HIGH (ref 80.0–100.0)
pO2, Arterial: 58 mmHg — ABNORMAL LOW (ref 80.0–100.0)
pO2, Arterial: 67 mmHg — ABNORMAL LOW (ref 80.0–100.0)

## 2010-07-18 LAB — PROTIME-INR
INR: 1.2 (ref 0.00–1.49)
INR: 1.2 (ref 0.00–1.49)
INR: 1.3 (ref 0.00–1.49)
INR: 1.5 (ref 0.00–1.49)
INR: 1.7 — ABNORMAL HIGH (ref 0.00–1.49)
Prothrombin Time: 15.9 seconds — ABNORMAL HIGH (ref 11.6–15.2)
Prothrombin Time: 15.9 seconds — ABNORMAL HIGH (ref 11.6–15.2)
Prothrombin Time: 16.7 seconds — ABNORMAL HIGH (ref 11.6–15.2)
Prothrombin Time: 18.8 seconds — ABNORMAL HIGH (ref 11.6–15.2)
Prothrombin Time: 21.1 seconds — ABNORMAL HIGH (ref 11.6–15.2)

## 2010-07-18 LAB — POCT I-STAT, CHEM 8
BUN: 11 mg/dL (ref 6–23)
BUN: 8 mg/dL (ref 6–23)
Calcium, Ion: 1.03 mmol/L — ABNORMAL LOW (ref 1.12–1.32)
Chloride: 108 mEq/L (ref 96–112)
Creatinine, Ser: 1 mg/dL (ref 0.4–1.5)
HCT: 36 % — ABNORMAL LOW (ref 39.0–52.0)
Sodium: 144 mEq/L (ref 135–145)
Sodium: 145 mEq/L (ref 135–145)
TCO2: 25 mmol/L (ref 0–100)
TCO2: 30 mmol/L (ref 0–100)

## 2010-07-18 LAB — BASIC METABOLIC PANEL
BUN: 12 mg/dL (ref 6–23)
BUN: 18 mg/dL (ref 6–23)
CO2: 28 mEq/L (ref 19–32)
CO2: 31 mEq/L (ref 19–32)
Calcium: 7.5 mg/dL — ABNORMAL LOW (ref 8.4–10.5)
Calcium: 8 mg/dL — ABNORMAL LOW (ref 8.4–10.5)
Chloride: 100 mEq/L (ref 96–112)
Chloride: 104 mEq/L (ref 96–112)
Chloride: 109 mEq/L (ref 96–112)
Creatinine, Ser: 1.13 mg/dL (ref 0.4–1.5)
Creatinine, Ser: 1.17 mg/dL (ref 0.4–1.5)
GFR calc Af Amer: >60 mL/min (ref >60)
GFR calc Af Amer: >60 mL/min (ref >60)
GFR calc Af Amer: >60 mL/min (ref >60)
GFR calc non Af Amer: >60 mL/min (ref >60)
GFR calc non Af Amer: >60 mL/min (ref >60)
Glucose, Bld: 100 mg/dL — ABNORMAL HIGH (ref 70–99)
Potassium: 3.8 mEq/L (ref 3.5–5.1)
Potassium: 4.4 mEq/L (ref 3.5–5.1)
Sodium: 137 mEq/L (ref 135–145)
Sodium: 142 mEq/L (ref 135–145)

## 2010-07-18 LAB — COMPREHENSIVE METABOLIC PANEL
BUN: 13 mg/dL (ref 6–23)
CO2: 30 mEq/L (ref 19–32)
Calcium: 8.7 mg/dL (ref 8.4–10.5)
Chloride: 100 mEq/L (ref 96–112)
Creatinine, Ser: 1.26 mg/dL (ref 0.4–1.5)
GFR calc non Af Amer: 60 mL/min — ABNORMAL LOW (ref >60)
Glucose, Bld: 90 mg/dL (ref 70–99)
Total Bilirubin: 1 mg/dL (ref 0.3–1.2)

## 2010-07-18 LAB — URINALYSIS, ROUTINE W REFLEX MICROSCOPIC
Bilirubin Urine: NEGATIVE
Glucose, UA: NEGATIVE mg/dL
Hgb urine dipstick: NEGATIVE
Ketones, ur: NEGATIVE mg/dL
Nitrite: NEGATIVE
Protein, ur: NEGATIVE mg/dL
Specific Gravity, Urine: 1.008 (ref 1.005–1.030)
Urobilinogen, UA: 0.2 mg/dL (ref 0.0–1.0)
pH: 6 (ref 5.0–8.0)

## 2010-07-18 LAB — POCT I-STAT GLUCOSE
Glucose, Bld: 98 mg/dL (ref 70–99)
Operator id: 3406
Operator id: 3406
Operator id: 3406

## 2010-07-18 LAB — BLOOD GAS, ARTERIAL
Acid-Base Excess: 2.1 mmol/L — ABNORMAL HIGH (ref 0.0–2.0)
Bicarbonate: 26.6 mEq/L — ABNORMAL HIGH (ref 20.0–24.0)
FIO2: 0.21 %
O2 Saturation: 95.8 %
TCO2: 28 mmol/L (ref 0–100)
pO2, Arterial: 74.1 mmHg — ABNORMAL LOW (ref 80.0–100.0)

## 2010-07-18 LAB — HEMOGLOBIN A1C
Hgb A1c MFr Bld: 5.5 % (ref 4.6–6.1)
Mean Plasma Glucose: 111 mg/dL

## 2010-07-18 LAB — PREPARE RBC (CROSSMATCH)

## 2010-07-18 LAB — MAGNESIUM
Magnesium: 2.5 mg/dL (ref 1.5–2.5)
Magnesium: 3.1 mg/dL — ABNORMAL HIGH (ref 1.5–2.5)

## 2010-07-18 LAB — HEMOGLOBIN: Hemoglobin: 7.4 g/dL — CL (ref 13.0–17.0)

## 2010-07-18 LAB — HEPARIN LEVEL (UNFRACTIONATED)
Heparin Unfractionated: 0.27 IU/mL — ABNORMAL LOW (ref 0.30–0.70)
Heparin Unfractionated: 0.51 IU/mL (ref 0.30–0.70)

## 2010-07-18 LAB — APTT: aPTT: 33 seconds (ref 24–37)

## 2010-07-19 ENCOUNTER — Other Ambulatory Visit: Payer: Self-pay | Admitting: Emergency Medicine

## 2010-07-19 MED ORDER — PRASUGREL HCL 10 MG PO TABS: 10.0000 mg | ORAL_TABLET | Freq: Every day | ORAL | Status: DC

## 2010-07-20 ENCOUNTER — Other Ambulatory Visit: Payer: Self-pay | Admitting: Emergency Medicine

## 2010-07-20 MED ORDER — PRASUGREL HCL 10 MG PO TABS: 10.0000 mg | ORAL_TABLET | Freq: Every day | ORAL | Status: DC

## 2010-08-01 ENCOUNTER — Other Ambulatory Visit: Payer: Self-pay

## 2010-08-01 ENCOUNTER — Encounter: Payer: Self-pay | Admitting: Cardiovascular Disease

## 2010-08-01 MED ORDER — RANOLAZINE ER 1000 MG PO TB12: 1000.0000 mg | ORAL_TABLET | Freq: Two times a day (BID) | ORAL | Status: DC

## 2010-08-15 NOTE — Op Note (Signed)
NAMEABDULRAHIM, SIDDIQI NO.:  [id number]   MEDICAL RECORD NO.:  [id number]          PATIENT TYPE:  INP   LOCATION:  2310                         FACILITY:  [facility]   PHYSICIAN:  Kerin Perna, [name]  DATE OF BIRTH:  June 10, 1955   DATE OF PROCEDURE:  DATE OF DISCHARGE:                               OPERATIVE REPORT   OPERATION:  1. Coronary artery bypass grafting x4 (left internal mammary artery      LAD, sequential saphenous vein graft to OM-1 and OM-2, saphenous      vein graft to diagonal).  2. Left atrial and right atrial maze procedure for paroxysmal atrial      fibrillation.  3. Patch closure of a 2.5-cm atrial septal defect.   SURGEON:  Kerin Perna, MD   ASSISTANT:  Theda Belfast, PA-C   ANESTHESIA:  General.   PREOPERATIVE DIAGNOSES:  Left main and 2-vessel coronary artery disease,  history of paroxysmal atrial fibrillation with stroke, and 2-2.5 cm  atrial septal defect with right to left shunt.   POSTOPERATIVE DIAGNOSES:  Left main and 2-vessel coronary artery  disease, history of paroxysmal atrial fibrillation with stroke, and 2-  2.5 cm atrial septal defect with right to left shunt.   INDICATIONS:  The patient is a 55 year old male with a strong family  history of coronary disease and prior history of embolic stroke and  paroxysmal atrial fibrillation.  He presented with progressive angina  and a cardiac catheterization was performed demonstrating a 75% stenosis  of the left main with moderate stenosis of the LAD diagonal and the high-  grade proximal stenosis of the circumflex at its origin.  The right  coronary was dominant and nonobstructed.  Overall, LVEF was mildly  reduced and he had no significant valvular insufficiency.  He was felt  to be a candidate for multivessel coronary revascularization as well as  a maze procedure and closure of the ASD.   Prior to surgery, I examined the patient in his hospital room and  reviewed  results of the cardiac cath and echo with the patient and  family.  I discussed the indications and expected benefits of coronary  bypass surgery, maze procedure, and closure of his ASD for treatment of  his multiple cardiac problems.  I reviewed the alternatives to surgical  therapy.  I discussed the issues of surgery with the patient including  the location of the surgical incisions, the choice of conduit to include  internal mammary artery and endoscopically harvested saphenous vein, and  the expected postoperative recovery.  I reviewed the strokes to him of  this operation including the strokes of stroke, bleeding, MI, infection,  pacemaker requirement, and death.  After reviewing these issues, he  demonstrated his understanding and agreed to proceed with the surgery  under what I felt was an informed consent.   OPERATIVE FINDINGS:  1. Severe multivessel coronary artery disease treated effectively with      surgical revascularization.  2. A 2.5 cm ASD treated with a pericardial patch closure using core-  matrix material.  3. Oversewing of left atrial appendage to prevent further embolic      stroke.   PROCEDURE IN DETAIL:  The patient was brought to the operating room,  placed supine on the operating table, where general anesthesia was  induced.  A transesophageal 2-D echo probe was placed by the  anesthesiologist.  The chest, abdomen, and legs were prepped with  Betadine and draped as a sterile field.  A sternal incision was made as  the saphenous vein was harvested endoscopically from the right leg.  The  internal mammary artery was harvested as a pedicle graft from its origin  at the subclavian vessels.  It is a good vessel with excellent flow.  Heparin was administered and the ACT was documented as being  therapeutic.  The sternal retractor was placed and the pericardium was  suspended.  Pursestring's were placed in the ascending aorta and the  superior vena cava.  After  the vein had been harvested, the patient was  cannulated and placed on bypass.  A second pursestring was placed low on  the right atrial chamber for IVC cannulation.  After biatrial caval  cannulation was completed, the interatrial groove was dissected.  Vessel  loops were placed around the SVC and IVC.  The coronaries were  identified for grafting.  The circumflex vessels were small, but  graftable.  The LAD was intramyocardial in its proximal half and then  anastomosis was placed, where it became epicardial.  The diagonal had a  70% stenosis at its origin from the LAD.  Cardioplegia catheters were  placed for both antegrade and retrograde cold blood cardioplegia and the  vein and mammary artery were prepared for the distal anastomoses.  The  patient was cooled to 30 degrees and aortic crossclamp was applied.  A 1  L of cold blood cardioplegia was delivered in split doses between the  antegrade aortic and retrograde coronary sinus catheters.  There is good  cardioplegic arrest and septal temperature dropped less than 12 degrees.   The atrial appendage was then exposed and the bipolar ablation device  was applied to the base of the atrial appendage for the initial aspect  of the maze procedure.  Next, the left-sided pulmonary veins were  dissected from the left main PA and the pericardial reflection taking  down the ligament of Marshall.  The veins were encircled with the vessel  loop and the bipolar ablation clamp was applied twice and effective  ablation lines were applied to the left atrial cuff around the left  pulmonary veins.   Cardioplegia was redosed.  Next, the distal coronary anastomoses were  performed.  First distal anastomosis was the sequential vein graft to  the OM-1 and OM-2.  The OM-1 was a 1.3 mm vessel with a proximal 90%  stenosis.  A side-to-side anastomosis of the vein was constructed using  running 7-0 Prolene.  There was good flow through the graft.  The second   distal anastomosis was the continuation of the sequential vein graft to  the distal circumflex.  There was a 1.5-mm vessel with a proximal 90%  stenosis.  The end of the vein was sewn end-to-side with running 7-0  Prolene with good flow through the graft.  Cardioplegia was redosed.  The third distal anastomosis was to the diagonal branch of the LAD  (second diagonal).  This was 1.5 mm vessel with proximal 75% stenosis at  its origin.  Reverse saphenous vein of smaller caliber was  sewn end-to-  side with running 7-0 Prolene with good flow to the graft.  The fourth  distal anastomosis was to the distal third to the LAD.  It was a 1.5-mm  vessel and the left IMA pedicle was brought through an opening created  in the left lateral pericardium, it was brought down on to the LAD and  sewn end-to-side with running 8-0 Prolene.  There was good flow through  the anastomosis after briefly releasing the pedicle bulldog and the  mammary artery.  The bulldog was reapplied and the mammary was secured  to the epicardium.  The cardioplegia was redosed.   Next, the left atrial maze procedure was completed.  An incision in the  AV groove was performed and the atrial retractors positioned.  The  bipolar ablation clamp was placed around the base of the right-sided  pulmonary veins to complete the encircling incision, which was made  anteriorly by the atriotomy.  After the posterior ablation line was  created, a ablation line across the isthmus of the left atrium was  performed using the bipolar clamp to join the two encircling placing  lines around each side of pulmonary veins.  Next, a second bipolar  ablation line was created across the back wall of the left atrium from  the opening on the right pulmonary vein side to the mitral annulus at  the P3 posterior segment area.   Next, the left atrial appendage was oversewn using double layer of 4-0  Prolene.  This completed the left atrial maze procedure.  The  left  atrium was then closed with a running 4-0 Prolene in two layers.   Cardioplegia was redosed.  The caval tapes were tightened and the  retrograde catheter was removed through the opening where the retrograde  catheter was inserted in the right atrium and a vertical incision was  made down towards the crista terminalis, exposing the right atrium.  A  fairly large defect in the septum in the area of the foramen ovale was  noted.  It was decided to use a patcher, then try a primary closure due  to the size of this defect.  All the patch material (core matrix) was  being prepared.  The right atrial ablation lines were performed.  The  first bipolar ablation line was made from the base of the vertical  incision extending up towards the SVC.  The second ablation line was  made from the base of the atrial incision inferiorly toward the IVC  using the bipolar clamp.  The next ablation line was used in the wall of  the right atrium at the 2 o'clock position from the opening in the  superior incision of the right atrium towards the tricuspid annulus.   Next, the patch was applied to close the ASD using a running 5-0 Prolene  and using a 2-1/2 cm patch of core-matrix material.  This covered the  defect nicely.  The atriotomy was then closed in two layers using  running 4-0 Prolene and the caval tapes were relaxed.  Cardioplegia was  redosed.   Next, the two proximal vein anastomoses were placed on the ascending  aorta using a 4-mm punch and running 7-0 Prolene.  Air was removed from  the coronaries with the usual de-airing maneuvers on bypass and the  proximal anastomosis was tied.   The crossclamp was removed.  The cardioplegia catheter was removed.  The  vent was reinserted in the ascending aorta to remove any residual  air.  Air was aspirated from the vein grafts with a 27-gauge needle.  The  proximal distal anastomoses and the atriotomy incisions were checked and  found to be  hemostatic.  The patient was rewarmed to 37 degrees.  Temporary pacing wires were applied.  When the patient was rewarmed and  reperfused adequately, the lungs were expanded, the ventilator was  resumed.  The patient was then weaned from bypass on a low-dose dopamine  with stable hemodynamics.  The patient was initially in an AV  sequentially paced rhythm and then started a sinus rhythm shortly after  separation from bypass.  Protamine was administered without adverse  reaction.  The transesophageal echo showed closure of the ASD.  Global  LV function was preserved.  After protamine, there was no adverse  reaction.  The cannula was removed.  The patient still had a diffuse  coagulopathy and was given platelets and FFP.  She did have history of  preoperative Coumadin use.  The mediastinum was irrigated with warm  antibiotic irrigation.  The superior pericardial fat was closed over the  aorta.  Two mediastinal and a left pleural chest tube were placed and  brought through separate incisions.  The sternum was closed with  interrupted steel wire.  The pectoralis fascia was closed in running #1  Vicryl.  The subcutaneous and skin layers were closed in running Vicryl  and sterile dressings were applied.  Total bypass time was 200 minutes.      Kerin Perna, [name]  Electronically Signed     PV/MEDQ  D:  05/12/2008  T:  05/13/2008  Job:  [id number]   cc:   Bevelyn Buckles. Bensimhon, MD

## 2010-08-15 NOTE — Assessment & Plan Note (Signed)
[facility] OFFICE NOTE   NAME:Schrom, DAYVEON HALLEY                          MRN:          [id number]  DATE:07/20/2008                            DOB:          1955-11-12    Mr. Fukuda comes in today for followup.   He has now status post coronary bypass grafting, left atrial appendage  closure, and closure of patent foramen ovale.   He has had no symptoms of palpitations or atrial fib.  He says he feels  a lot better has lot more energy since his procedure.  He continues to  be on amiodarone 200 mg twice a day and Coumadin.   PHYSICAL EXAMINATION:  GENERAL:  His exam today looks remarkably good.  He is alert and oriented x3.  VITAL SIGNS:  Blood pressure 122/85, his pulse is 82 and regular, his  weight is 193.  HEART:  Regular rate and rhythm.  S2 splits.  Sternotomy site is stable.  LUNGS:  Clear.  ABDOMEN:  Soft, good bowel sounds.  EXTREMITIES:  No edema.  Pulses are present.   Electrocardiogram shows sinus rhythm with nonspecific changes.   PLAN:  1. Discontinue amiodarone.  2. Continue the rest of his medications including Coumadin and      metoprolol.  3. See me back again in 8-10 weeks.  At that time, we will make a      decision about long-term Coumadin.  We will also consult Dr. Sandria Manly      along with Dr. Gala Romney and Dr. Graciela Husbands.      Thomas C. Daleen Squibb, MD, [facility]  Electronically Signed    TCW/MedQ  DD: 07/20/2008  DT: 07/21/2008  Job #: [id number]   cc:   Julieanne Manson

## 2010-08-15 NOTE — Cardiovascular Report (Signed)
NAMEKAMEREN, PARGAS                   ACCOUNT NO.:  [id number]   MEDICAL RECORD NO.:  [id number]          PATIENT TYPE:  OIB   LOCATION:  1967                         FACILITY:  [facility]   PHYSICIAN:  Bevelyn Buckles. Bensimhon, MDDATE OF BIRTH:  Aug 15, 1955   DATE OF PROCEDURE:  05/10/2008  DATE OF DISCHARGE:                            CARDIAC CATHETERIZATION   PATIENT IDENTIFICATION:  Kevin [name] is a delightful 55 year old male with  a history of paroxysmal atrial fibrillation complicated by 2 embolic  strokes.  He also has a history of patent foramen ovale.  He had a  stress test for atypical chest pain back in 2007 which showed an EF of  51% with no ischemia or scar.  He presented to the clinic on April 23, 2008, with chest pain which was concerning for angina.  He was referred  for a diagnostic catheterization.  Given his history of previous  strokes, we employed a Lovenox bridge to cover him while he was off his  Coumadin.   PROCEDURES PERFORMED:  1. Coronary angiography.  2. Left heart cath.  3. Left ventriculogram.   DESCRIPTION OF PROCEDURE:  The risks and indication of the procedure  were explained.  Consent was signed and placed on the chart.  A 4-French  arterial sheath was placed in the right femoral artery using a modified  Seldinger technique.  Standard catheters including a JL4, a 3D RC, and  angled pigtail were used.  All catheter exchanges made over wire.  There  were no apparent complications.   Central aortic pressure 101/64 with a mean of 81.  LV pressure 106/3  with an EDP of 18.  No aortic stenosis.   Left main had an eccentric ostial calcification almost like a napkin  ring lesion of about 70% stenosis.  This was seen best in the cranial  views.  We took multiple images of this.   LAD was a long vessel giving off 2 diagonals, had a 30% tubular lesion  proximally at 60% in the midsection, followed by a 70% hazy lesion on  the midsection.  The distal vessel was  suitable for grafting.   Left circumflex gave off 3 marginal branches.  There was a 40% ostial  lesion in the first marginal.  In the mid AV groove circumflex, there  was a 30% lesion.   Right coronary was a large dominant vessel that had a diffuse 30%  stenosis in the midsection.  Otherwise, free of significant disease.   Left ventriculogram done in the RAO position showed an EF of 50% with no  regional wall motion abnormalities.   ASSESSMENT:  1. Two-vessel coronary artery disease with ostial left main disease      described above.  2. Low normal left ventricular ejection fraction.   PLAN:  Given his left main lesion, he will obviously need surgical  evaluation for possible bypass surgery and also a concomitant MAZE  procedure and PFO closure.  Given his need for anticoagulation, I think  the best plan is to heparinize him and admit him.  Hopefully, we will  have the surgery done in the next day or two.  He will also need a TEE  prior to surgery.      Bevelyn Buckles. Bensimhon, MD  Electronically Signed     DRB/MEDQ  D:  05/10/2008  T:  05/10/2008  Job:  [id number]

## 2010-08-15 NOTE — Discharge Summary (Signed)
NAMECASIMIRO, Kevin NO.:  [id number]   MEDICAL RECORD NO.:  [id number]          PATIENT TYPE:  INP   LOCATION:  2035                         FACILITY:  [facility]   PHYSICIAN:  Kerin Perna, [name]  DATE OF BIRTH:  [birth year]   DATE OF ADMISSION:  05/10/2008  DATE OF DISCHARGE:  05/17/2008                               DISCHARGE SUMMARY   ADMITTING DIAGNOSES:  1. Multivessel coronary artery disease (with an ejection fraction of      50%).  2. Patent foramen ovale.  3. History of paroxysmal atrial fibrillation (since 2007).  4. 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  5. History of hyperlipidemia.  6. History of obesity.  7. Obstructive sleep apnea.   DISCHARGE DIAGNOSES:  1. Multivessel coronary artery disease (with an ejection fraction of      50%).  2. Patent foramen ovale.  3. History of paroxysmal atrial fibrillation (since 2007).  4. 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  5. History of hyperlipidemia.  6. History of obesity.  7. Obstructive sleep apnea.  8. Thrombocytopenia.   PROCEDURES:  1. Cardiac catheterization performed by Dr. Gala Romney on May 10, 2008.  For complete report, please see medical record in brief.      The patient was found to have a 75% stenosis of the left main with      moderate stenosis of the LAD diagonal and a high-grade proximal      stenosis of the circumflex at its origin.  As previously stated,      ejection fraction preserved at 50%.  2. Coronary artery bypass graft x4 (LIMA to LAD, SVG to OM1 and OM2,      SVG to diagonal left and right atrial maze procedure with ligation      of left atrial appendage.  3. Patch closure of 2.5 cm atrial septal defect by Dr. Donata Clay on      May 17, 2008.   HISTORY OF PRESENT ILLNESS:  This is a 55 year old Caucasian  male with  past medical history of PAF, CVA x2, PFO, hyperlipidemia with complaints  of progressive chest discomfort described as pressure and tightness  associated with exertion, cold weather, and stress.  He also had some  complaints of dyspnea upon exertion and decreasing exercise tolerance.  He underwent a cardiac catheterization on May 10, 2008, and was  found to have multivessel coronary artery disease with preserved EF.  There was no evidence of aortic stenosis or mitral regurgitation.  A  cardiothoracic consultation was obtained with Dr. Donata Clay.  Doppler  carotids revealed no evidence of internal carotid artery stenosis and  ABIs were normal bilaterally.  The patient then underwent the  aforementioned CABG x4 with EVH, right and left maze procedure and  ligation of left atrial appendage, patch closure 2.5 cm atrial septal  defect on May 12, 2008.   BRIEF HOSPITAL COURSE STAY:  The  patient was extubated late evening of  surgery/early February 11.  He remained afebrile and hemodynamically  stable when the A-lines were removed.  All chest tubes were removed by  May 13, 2008.  Followup chest x-ray revealed no pneumothorax,  minimal left base atelectasis.  The patient was volume overloaded and  diuresed accordingly.  He remained in normal sinus rhythm.  Coumadin was  initiated as the patient had taken preoperatively for both history of  PAF and CVA.  The patient did initially refuse CPAP, although he had  used it home.  He eventually did use it last couple of nights  postoperatively.  The patient was also found to have thrombocytopenia  but gradually his platelet count did increase.  Last platelet count was  111,000.  The patient was transferred from intensive care unit to [facility]U  for further convalescence.  He continued to progress well with cardiac  rehab and finally improved, such that by postoperative day #5 on  May 17, 2008, he was afebrile.   PHYSICAL  EXAMINATION:  VITAL SIGNS:  Stable.  Preop weight was 90 kg.  Weight on this date 91.2 kg.  GENERAL:  The patient already tolerating a healthy diet.  He had a bowel  movement and was ambulating well on room air.  CARDIOVASCULAR:  Regular rate and rhythm.  PULMONARY:  Clear to auscultation bilaterally.  ABDOMEN:  Benign.  EXTREMITIES:  Mild lower extremity edema.  Sternal and right lower  extremity wounds clean and dry.   He was discharged on May 17, 2008.   LATEST LABORATORY STUDIES:  Last PT and INR done on February 15 were  21.1 and 1.7 respectively.  Last BMET done on February 13 revealed the  potassium to be 4.4, BUN and creatinine were 18 and 1.13 respectively.  Last CBC also done this date, H and H 11.9 and 34.3 respectively, white  count 11,000, platelet count 111,000.  Last chest x-ray done on May 15, 2008, showed cardiomegaly, bibasilar and left mid lung atelectasis,  no evidence of pneumothorax.   DISCHARGE INSTRUCTIONS:  The patient is to remain on a low-fat, low-salt  diet.  He is not to drive or lift more than 10 pounds until instructed  otherwise.  He is to continue with his breathing exercise daily and to  walk every day and increase his frequency and duration as tolerated.  He  is instructed he may shower.  He is to call the office if any wound  problems arise.  A PT and INR appointment was to be obtained on May 19, 2008 at Dr. Vern Claude office in [place].  The patient is to call for  an appointment.   FOLLOWUP APPOINTMENTS:  The patient is to contact cardiologist, Dr. Daleen Squibb  for a followup appointment on May 25, 2008, the patient is going to  call the office for an appointment time.  An appointment was made for  the patient to see Dr. Donata Clay in the office on June 04, 2008.  Prior  to this office appointment, a chest x-ray was to be obtained.   Discharge medications include the following:  1. Lexapro 20 mg p.o. at bedtime.  2. Xanax 0.5 mg  2 at bedtime.  3. Zantac 300 mg p.o. at bedtime.  4. Simvastatin 40 mg p.o. at bedtime.  5. Enteric-coated aspirin 81 mg p.o. daily.  6. Amiodarone 200 mg p.o. 2 times daily.  7. Lasix 40 mg p.o. daily.  8. KCl 20 mg p.o.  daily both for 4 days.  9. Lopressor 12.5 mg p.o. 2 times daily.  10.Ultram 50 mg 1-2 tablets p.o. q.4-6 h. as needed for pain.  11.Coumadin 2.5 mg p.o. Monday, Wednesday, Friday, otherwise 5 mg p.o.      daily or as directed.   Kevin Fudge, PA      Kerin Perna, [name]  Electronically Signed    DZ/MEDQ  D:  06/29/2008  T:  06/30/2008  Job:  [id number]   cc:   Jesse Sans. Wall, MD, [facility]  Richard Darcella Gasman. Love, [name]

## 2010-08-15 NOTE — Assessment & Plan Note (Signed)
[facility]                            [place] OFFICE NOTE   NAME:Aldava, REZNOR FERRANDO                          MRN:          [id number]  DATE:05/25/2008                            DOB:          March 14, 1956    Mr. Nephew comes in today for followup after having coronary bypass  grafting, atrial septal defect closure, and a maze procedure of the left  and right atrium with closure of an atrial appendage.   Since discharge, he has been doing remarkably well.  He has had little  chest wall soreness but really has got along well.   He came in today for an INR and it was 8.  He is going to the hospital  for a regular draw to confirm this.  He has had no bleeding, no melena.   His current meds are,  1. Zantac 300 mg a day.  2. Coumadin as directed.  3. Metoprolol tartrate 12.5 b.i.d.  4. Amiodarone 200 b.i.d.  5. Simvastatin 40 mg a day.  6. Alprazolam 0.25-0.5 q.a.m. and the same in the p.m.  7. Lexapro 20 mg a day.   PHYSICAL EXAMINATION:  VITAL SIGNS:  His blood pressure is 118/78, his  pulse 58 and regular, respiratory rate is 18 and unlabored, weight is  196.  SKIN:  Color is normal.  HEENT:  Normal.  NECK:  Carotids were equal bilaterally without bruits.  No JVD.  Thyroid  is not enlarged.  Trachea is midline.  LUNGS:  Clear to auscultation and percussion.  No rales, rhonchi, or  rub.  Sternotomy site is stable.  HEART:  Normal S1 and S2 that splits physiologically.  ABDOMEN:  Soft, good bowel sounds.  EXTREMITIES:  No cyanosis, clubbing, or edema.  Pulses were present.  NEURO:  Intact.  He has some residual numbness in his third, fourth, and  fifth right fingers.  This has been ever since surgery.  This has been  since surgery and has not improved.  His hand grip is normal.   His EKG shows sinus rhythm with a normal QTC.   I had a long talk with Mr. Weldy and his wife.  He is seeing Dr. Donata Clay next week.  Once he is cleared there, he will  enter cardiac rehab  here at [facility] where his wife works.  We will check up INR today with  a blood draw.  He has been instructed to hold his Coumadin in the  meantime.  Also, told Dr. Graciela Husbands about the timing of discontinuing his  amiodarone and perhaps even his Coumadin.  We feel he should be on  amiodarone for at least another 6 weeks.  I will see him back at that  time, if his in sinus rhythm we will discontinue it.  In regard to his  Coumadin, we will probably get a consensus call from Dr. Graciela Husbands and Dr.  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     Thomas C. Wall, MD, [facility]  Electronically Signed    TCW/MedQ  DD: 05/25/2008  DT: 05/26/2008  Job #: [id number]   cc:   Julieanne Manson

## 2010-08-15 NOTE — Assessment & Plan Note (Signed)
[facility] OFFICE NOTE   NAME:Kevin [name]                          MRN:          [id number]  DATE:04/23/2008                            DOB:          1955-04-10    PRIMARY CARE PHYSICIAN:  Dr.  Franne Forts.   NEUROLOGIST:  Genene Churn. Love, MD   INTERVAL HISTORY:  Kevin [name] is a very pleasant 55 year old male with a  history of paroxysmal atrial fibrillation, hyperlipidemia, and history  of 2 previous strokes.  He also has a patent foramen ovale.  He has no  known history of heart disease.  He did have a stress test for some  atypical chest pain back in 2007, which showed an EF of about 51% with  no ischemia or scar.   Today, he presents with his wife for an unscheduled visit.  Over the  past few weeks, he has been having fairly regular episodes of chest  pressure radiating to his back, this should to be happens when he is at  rest.  It is a pressure, it occasionally radiates to his right neck and  down his arm.  This is not consistent.  He does not have a significant  diaphoresis or dyspnea associated with it.  He says the worst episode  occurred about a week ago when he was in a meeting, had severe pressure  during the meeting and had to leave the meeting, it lasted about 20 or  30 minutes, and then resolved.   Discomfort has not reproduced with exertion per se.  He does note that  when he exerts himself quickly, he does get a little discomfort.  He  does have a history of reflux disease, but this has been controlled on  Zantac and really not been a problem.  He denies any melena.  No bright  red blood per rectum.  There has been no abdominal pain.  No fevers or  chills.   REVIEW OF SYSTEMS:  Otherwise negative except for HPI and problem list.   PAST MEDICAL HISTORY:  1. 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  2. Paroxysmal atrial fibrillation.  3. Hyperlipidemia.  4. History of patent foramen ovale.  5.  Gastroesophageal reflux disease.  6. Obesity.   CURRENT MEDICATIONS:  1. Zantac 300 a day,  2. Coumadin.  3. Metoprolol 25 b.i.d.  4. Lexapro 20.  5. Xanax 0.25-0.5 in the a.m. and 0.5-1.0 in the p.m.  6. Simvastatin 40 a day.   ALLERGIES:  No known drug allergies.   SOCIAL HISTORY:  He is married.  He works as a Games developer.  He does not smoke cigarettes.   FAMILY HISTORY:  Notable for coronary artery disease in several of his  first-degree relatives.   PHYSICAL EXAMINATION:  GENERAL:  He is a pleasant man in no acute  distress.  Ambulates around the clinic without any respiratory  difficulty.  VITAL SIGNS:  Blood pressure is 118/78, heart rate 58, and weight is  196.  HEENT:  Normal.  NECK:  Supple.  There is  no JVD.  Carotids are 2+ bilaterally without  any bruits.  There is no lymphadenopathy or thyromegaly.  CARDIAC:  PMI  is nondisplaced.  Regular rate and rhythm.  No murmurs, rubs, or  gallops.  LUNGS:  Clear.  ABDOMEN:  Obese, nontender, and nondistended.  No hepatosplenomegaly.  No bruits.  No masses.  Good bowel sounds.  EXTREMITIES:  Warm with no  cyanosis, clubbing, or edema.  NEUROLOGIC:  Alert and oriented x3.  Cranial nerves II-XII are intact.  Moves all 4 extremities without difficulty.  Affect is very pleasant.   EKG shows sinus bradycardia at rate a of 58.  No ST-T wave  abnormalities.  There is normal axis and intervals.   ASSESSMENT AND PLAN:  1. Chest pain.  This is both typical and atypical features.  However,      he does have a significant cardiovascular risk factors.  We had a      long discussion about the diagnostic possibilities including;  2. Exercise stress testing.  3. Cardiac catheterization.  4. Cardiac CT.   We have gone through the risks and indications.  At this point, I think  it is probably best to proceed with to cardiac catheterization to  evaluate his coronary arteries as well as to do an abdominal aortogram  to  make sure he does not have an aneurysm.  However, this was  complicated by his need for heparin or Lovenox bridge prior to the  catheterization.  They will think about and get back to [place] on Monday if  he wants to proceed with catheterization.  We will start getting an INR  Monday and then ranging his bridge for catheterization later in the week  once his INR is down to 1-7 or less.  Should his catheterization being  negative that I think he would need a Gastrointestinal workup.  We did  discuss the use of sublingual nitroglycerin as needed.  Obviously, his  chest pain gets worse more acutely, he will need to call 911 and go to  the ER.   DISPOSITION:  Pending decision on cardiac catheterization.  However,  probably we will most likely perform catheterization next week with  heparin or Lovenox bridge.   Total time of consultation was approximately 1 hour.     Kevin Buckles. Bensimhon, MD  Electronically Signed    DRB/MedQ  DD: 04/23/2008  DT: 04/24/2008  Job #: [id number]   cc:   Genene Churn. Love, [name]

## 2010-08-15 NOTE — Consult Note (Signed)
Kevin [name], Kevin [name]                   ACCOUNT NO.:  [id number]   MEDICAL RECORD NO.:  [id number]          PATIENT TYPE:  EMS   LOCATION:  MAJO                         FACILITY:  [facility]   PHYSICIAN:  Deanna Artis. Hickling, M.D.DATE OF BIRTH:  December 10, 1955   DATE OF CONSULTATION:  10/10/2008  DATE OF DISCHARGE:                                 CONSULTATION   CHIEF COMPLAINT:  Right eye visual changes, and clumsy right hand.   HISTORY OF THE PRESENT CONDITION:  I was asked to see Kevin [name].  He is a  patient of my partner, Dr. Genene Churn. Love.  The patient has had 2 strokes  in his life, one in March and the other in May 2007.  The first occurred  when he was loading a truck.  He developed aphasia and right-sided  weakness.  His stroke was in the left thalamus.  It left him with some  problems with memory.  He was noted to have a patent foramen ovale.  His  symptoms recovered before t-PA was given and he was placed on aspirin.   On Aug 19, 2005, he developed a right posterior cerebral artery stroke  with a left visual field cut, left arm, hand, leg weakness and numbness.  He had a deep posterior cerebral artery distribution infarction on the  right with occlusion of the posterior cerebral artery.  A few weeks  later, he had numbness in the right hand and had an MRI scan that was  limited that showed left cerebral perfusion in that distribution but no  new strokes.   The patient was scheduled to have patent foramen ovale closure in 2007,  for reasons that are not clear to me that did not take place.  The  patient has as residual of his right brain stroke, a left superior  quadrant anopsia.  His risk factors for stroke include hypertension and  dyslipidemia.  He has never smoked cigarettes.   His workup at [facility] in May 2007 included MRA of the neck  which showed a possible ulcer in the right internal carotid artery, it  was thought to be a source of emboli.  The right posterior  cerebral  artery showed occlusion in an area compatible with the acute infarction.  EKG did not show atrial fibrillation at that time.  A 2-D echocardiogram  showed ejection fraction of 65% with no embolic source.  Carotid Doppler  showed no stenosis.  EEG showed some mild slowing over the right  posterior temporal regions.  Total cholesterol 210, LDL 149,  triglyceride 124, HDL 36.  Coagulation studies were normal.   The patient apparently was doing heavy lifting for the first stroke but  was at rest for the second stroke.   He was placed on Aggrenox and statin drug.   He has been followed my partner, Dr. Sandria Manly for a number of years.  In  February, he was admitted to [facility] with significant  multivessel coronary artery disease with ejection fraction of 50%,  patent foramen ovale, paroxysmal atrial fibrillation, dyslipidemia,  obesity,  and history of obstructive sleep apnea.   Other medical problems included gastroesophageal reflux disease.  He was  evaluated by Dr. Donata Clay, who performed a 4-vessel coronary artery  bypass graft, closed the atrial appendage on the left, patched the  patent foramen ovale, and did a maze procedure to ablate the pacemaker  causing atrial fibrillation.  The patient has been in sinus rhythm since  that time.  Nonetheless, he has been placed on Coumadin, long term.  His  INR last week was only 1.9 which is fairly low for him.   Today, he awakened and went out in the yard to work.  While trying to  cut some branches, he kept fumbling with the head clipper.  When he came  into the house, he made breakfast and had some difficulty with holding  onto the spatula.  The last problem that he knows is terms of clumsiness  of the hand, was trying to put soap in the dishwasher and fumbling with  that as well.  This was about 8:45.   The patient wanted to do some work in his office and found that he was  somewhat confused and had difficulty with focus.   He then felt tired and  had a dull headache.  He went to lay down.   Upon arising somewhere around 11 o'clock, he had problems with his  vision.  When he closed over his left eye, there were distortion of  vision and also alteration of colors in the right eye.  The left eye was  totally normal and the right was occluded.  The patient's wife Kevin [name]  contacted me and I agree to seeing him here at [facility].   CURRENT MEDICATIONS:  1. Alprazolam 0.5 mg tablets one-half tablet in the morning p.r.n., 2      tablets at bedtime.  2. Coumadin 2.5 mg daily.  3. He received a single dose of 5 mg when his dose was low, but the      overall dose was not increased.  4. Lexapro 20 mg once daily.  5. Metoprolol 25 mg one-half tablet twice daily.  6. Simvastatin 40 mg in the evening.  7. Zantac 300 mg in the evening.   DRUG ALLERGIES:  None known.   The patient had been on amiodarone previously which altered his  prothrombin times.  He has been on this for a few weeks.  His surgery  took place in February 2010.   Past medical history has been noted above.   PAST SURGICAL HISTORY:  Also noted above.   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  He is married and has children.  He  does not smoke, use tobacco products, or alcohol.   Family history is positive for coronary artery disease.  Negative for  diabetes, negative for stroke.  Positive for migraine in his daughter,  who is a patient of mine and also his mother.   Other medical problems overtime has also include depression, anxiety.   PHYSICAL EXAMINATION:  GENERAL:  On examination today, this is a  pleasant gentleman in no acute distress.  VITAL SIGNS:  Temperature 96.8, blood pressure 120/79, resting pulse 73,  respirations 18, oxygen saturation 98%.  HEAD, EYES, EARS, NOSE, AND THROAT:  No signs of infection.  NECK:  Supple neck, full range of motion.  No  cranial or cervical  bruits.  LUNGS:  Clear to auscultation.  HEART:  No murmurs.  Pulses normal.  ABDOMEN:  Soft, nontender.  Bowel sounds normal.  EXTREMITIES:  Well formed without edema, cyanosis, alterations in tone  or tight heel cords.  SKIN:  No lesions.  VASCULAR:  Tone was normal.  NEUROLOGIC:  Mental status, the patient was awake and alert.  He had no  dysphasia or dyspraxia.  Complaints of the eye findings have  disappeared, and he has no symptoms at this time.  Cranial nerves,  round, reactive pupils.  No signs of afferent pupillary defect.  Funduscopic examination is normal with sharp disc margins and normal  vessels.  Extraocular movements are full and conjugate.  The patient has  a left superior quadrant anopsia, which is stable and it is present both  to wiggling of his fingers and also to double simultaneous stimuli.  Symmetric facial strength.  Midline tongue and uvula.  Air conduction  greater than bone conduction bilaterally.  Motor examination, normal  strength, tone, and mass.  Good fine motor movements.  No pronator  drift.  Sensation intact to cold, vibration, stereognosis.  Cerebellar  examination, good finger-to-nose, rapid repetitive movements.  No  tremor, dystaxia.  Symmetric gait and station was fairly stable.  He had  to walk carefully with a tandem but did not fall.  His Romberg response  is negative.  He walks on the heels and toes.  Reflexes were symmetric  and diminished but present.  The patient had bilateral flexor plantar  responses.  There was no reflex predominance.   IMPRESSION:  1. Subjective visual changes, 368.10.  2. Clumsiness of right hand.  3. Prior history of strokes in the left thalamus and right posterior      cerebral artery distribution.  4. History of atrial fibrillation, now quiescent.  5. On Coumadin, in slightly subtherapeutic range.  6. History of dyslipidemia.   PLAN:  We will arrange and check a prothrombin time.  We  also will check  an MRI scan of the brain without contrast, MRA intracranial.  If these  are negative, I am going to send him home.  I cannot provide a unified  location for clumsiness of the right hand and a right monocular visual  issue.   I have recommended that the patient see his ophthalmologist, Dr. Oren Bracket  if possible to do so a dilated funduscopy.  The patient should follow up  with Dr. Sandria Manly at his regular time and with Dr. Sullivan Lone at his regular  time.  If his symptoms recur or if we find evidence of abnormality on  the MRI scan, he will be admitted for further workup.      Deanna Artis. Sharene Skeans, [name]  Electronically Signed     WHH/MEDQ  D:  10/10/2008  T:  10/11/2008  Job:  [id number]   cc:   Genene Churn. Love, [name]  Richard Sullivan Lone

## 2010-08-15 NOTE — Assessment & Plan Note (Signed)
[facility]                                 ON-CALL NOTE   NAME:Ferraris, JACQUELYN ANTONY                          MRN:          [id number]  DATE:05/25/2008                            DOB:          1956/02/16    [place] PHONE NOTE   I received a call from [facility] with the  result on Mr. Rathert INR, which was drawn early today.  They told me it  was 4.4.  I called the patient and his wife answered.  I advised that  his INR was 4.4 and they had already known that.  Apparently, his INR  was over 8 in the office today and that is why they were sent to  [facility] for further check.  The [place] office has already contacted  them and advised them with regards to what to do with the Coumadin.  They had no additional questions and will follow up as scheduled.     Nicolasa Ducking, ANP  Electronically Signed    CB/MedQ  DD: 05/25/2008  DT: 05/26/2008  Job #: [id number]

## 2010-08-15 NOTE — Consult Note (Signed)
Kevin [name], LAKEMAN NO.:  [id number]   MEDICAL RECORD NO.:  [id number]          PATIENT TYPE:  INP   LOCATION:  6532                         FACILITY:  [facility]   PHYSICIAN:  Kerin Perna, [name]  DATE OF BIRTH:  1955-09-22   DATE OF CONSULTATION:  05/10/2008  DATE OF DISCHARGE:                                 CONSULTATION   PRIMARY CARE PHYSICIAN:  Julieanne Manson, [name] in [place].   REASON FOR CONSULTATION:  Significant left main and multivessel coronary  disease with class III angina.   HISTORY OF PRESENT ILLNESS:  I was asked to evaluate this 55 year old  Caucasian nonsmoker for potential multivessel coronary bypass grafting  for recently diagnosed significant left main and two-vessel coronary  disease.  The patient has had a history of progressive chest discomfort  described as pressure and tightness associated with exertion and cold  weather and stress.  He has had no nocturnal symptoms.  He has also  noted dyspnea on exertion and decreasing exercise tolerance.  He has a  strong family history of coronary disease with his parents and several  primary family members having had coronary surgery or stent  intervention.  He underwent his first cardiac catheterization earlier  today by Dr. Gala Romney which demonstrated a 75% stenosis of the left  main with moderate stenosis of the LAD and no significant disease of the  right coronary.  His EF was 50% and LVEDP was 15 mmHg.  There is no  evidence of aortic stenosis or mitral regurgitation by cath.  Based on  his symptoms and coronary anatomy he was felt to be candidate for  multivessel coronary revascularization.   The patient also has a history of atrial fibrillation for approximately  2 to 3 years associated with 2 separate embolic strokes to the left and  right thalamic midbrain.  He is not on chronic Coumadin and has  maintained a sinus rhythm on oral Toprol XL.  He has not required  cardioversion in the  past.  During the evaluation of his atrial fib the  2-D echo apparently has shown a patent foramen ovale in the past.   PAST MEDICAL HISTORY:  1. Status post CVA x2 in 2007 followed by Dr. Avie Echevaria on chronic      Coumadin with residual short-term memory problems and left-sided      visual field deficit.  2. Paroxysmal atrial fibrillation since 2007.  3. Hyperlipidemia.  4. PFO.  5. GERD.  6. Obesity.   HOME MEDICATIONS:  1. Coumadin 5 mg a day except for 2.5 mg Monday, Wednesday, Friday.  2. Zantac 300 mg daily.  3. Metoprolol 25 mg b.i.d.  4. Lexapro 20 mg a day.  5. Xanax 0.5 mg q. a.m. 0.5 mg q. p.m.  6. Zocor 40 mg daily.   ALLERGIES:  None.   SOCIAL HISTORY:  The patient is a Doctor, hospital  for [employer].  He is married with children.  Does not  smoke use alcohol.   FAMILY HISTORY:  Positive for coronary artery disease.  Negative for  diabetes.   PHYSICAL EXAMINATION:  VITAL SIGNS:  Height is 5 feet 6 inches, weighs  192 pounds.  Blood pressure is 120/78, pulse 64 and sinus, oxygen  saturation 97%.  HEENT:  Exam is normocephalic.  Dentition good.  Pupils are equal.  NECK:  Without JVD, mass or bruit.  LUNGS:  Show no palpable supraclavicular adenopathy.  Breath sounds are  clear and equal.  There is no thoracic deformity.  CARDIAC:  Exam is regular rhythm without S3 gallop, murmur or rub.  ABDOMEN:  Soft, nontender without pulsatile mass.  He has a compression  dressing in the right groin.  EXTREMITIES:  Peripheral pulses are intact in all extremities.  He is  right-hand dominant and has no focal motor or neurologic deficits.   REVIEW OF SYSTEMS:  No weight loss or fever this winter.  No history of  thoracic trauma or history of abnormal x-ray or pulmonary nodule.  He  denies history of hemoptysis or any bleeding complications from his  chronic Coumadin therapy.  He denies diabetes or thyroid disease.  He  denies DVT,  claudication, or TIA, but he has had stroke x2.  He is still  very functional and works a full schedule for the [employer].  He denies any bleeding diathesis or prior blood transfusion  therapy.   LABORATORY DATA:  Reviewed the coronary arteriograms and discussed them  with Dr. Gala Romney.  He has significant left main stenosis which would  explain his symptoms.  A transesophageal echo is pending tomorrow to  assess the size and significance of patent foramen ovale and his  valvular function.   PLAN:  The patient will be scheduled for multivessel coronary artery  bypass grafting with maze procedure and probable closure of PFO on  February 10.  I have discussed the procedure with the patient and his  family and they understand and agree to proceed sign.      Kerin Perna, [name]  Electronically Signed     PV/MEDQ  D:  05/10/2008  T:  05/10/2008  Job:  [id number]   cc:   [facility]  Evie Lacks, MD  Julieanne Manson

## 2010-08-15 NOTE — Assessment & Plan Note (Signed)
OFFICE VISIT   Kevin [name], Kevin [name]  DOB:  02-11-1956                                        June 04, 2008  CHART #:  [id number]   CURRENT PROBLEMS:  1. Status post coronary artery bypass graft x4, left and right atrial      maze procedure, and patch closure of atrial septal defect on      May 12, 2008.  2. History of paroxysmal atrial fibrillation and remote      cerebrovascular accident, on chronic Coumadin.  3. Gastroesophageal reflux disease.   CURRENT MEDICATIONS:  1. Coumadin.  2. Toprol-XL 25 mg one-half tablet b.i.d.  3. Xanax one-half tablet b.i.d.  4. Amiodarone 200 mg b.i.d.  5. Zocor 40 mg daily.  6. Zantac 300 mg daily.  7. Lexapro 20 mg daily.  8. Tramadol p.r.n. pain.   PRESENT ILLNESS:  The patient is a 55 year old Caucasian gentleman, who  presented with unstable angina and a cardiac catheterization showing a  left main stenosis with two-vessel disease.  He had a history of  paroxysmal atrial fibrillation and a previous stroke in 2007.  He  subsequently underwent left IMA graft to the LAD and vein grafts to the  OM-1 and OM-2 and diagonal, left and right atrial maze procedure and  patch closure of 2.5 cm PFO.  He was discharged home on Coumadin and has  remained in sinus rhythm.  He has had no recurrent chest pain and the  surgical incisions are healing well.  He has had no evidence of CHF  since discharge.  He is currently weighing a 189 pounds.  He has lost 12-  15 pounds.   PHYSICAL EXAMINATION:  VITAL SIGNS:  Blood pressure 114/74, pulse 88 and  regular.  The rhythm strip shows him to be in sinus rhythm, respirations  are 18 with saturation on room air of 98%.  GENERAL:  He is alert and oriented.  LUNGS:  Breath sounds are clear and equal.  CHEST:  The sternum is stable and well healed.  CARDIAC:  Rhythm is regular without gallop or murmur.  EXTREMITIES:  His leg incision is well healed.  There is no peripheral  edema.   PA  and lateral chest x-ray shows stable cardiac silhouette with sternal  wires intact and there is no pleural effusion or pulmonary infiltrates.   RECOMMENDATIONS:  The patient is almost a month postop.  He should be  ready to start his outpatient rehab at [facility].  I  told him that he could resume driving and light activities, but not to  lift more than 20 pounds until May 2010.  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  He was  given another prescription for tramadol for incisional pain, which he  now takes usually at night only.  Hopefully, his amiodarone and the  Coumadin can be tapered and stopped 3-6 months post-maze procedure.  I  will plan on seeing him back as needed.   Kerin Perna, [name]  Electronically Signed   PV/MEDQ  D:  06/04/2008  T:  06/05/2008  Job:  [id number]   cc:   Thomas C. Daleen Squibb, MD, [facility]  Genene Churn. Love, [name]

## 2010-08-15 NOTE — Cardiovascular Report (Signed)
NAMESAYAN, ALDAVA                   ACCOUNT NO.:  [id number]   MEDICAL RECORD NO.:  [id number]          PATIENT TYPE:  OIB   LOCATION:  1965                         FACILITY:  [facility]   PHYSICIAN:  Kevin [name], MDDATE OF BIRTH:  02/09/1956   DATE OF PROCEDURE:  11/02/2008  DATE OF DISCHARGE:  11/02/2008                            CARDIAC CATHETERIZATION   PATIENT IDENTIFICATION:  Kevin [name] is a 55 year old male with a history  of coronary artery disease and atrial fibrillation.  He recently  underwent bypass surgery and a maze procedure as well as closure of an  ASD, this was back in February 2010.  He has been going to Cardiac Rehab  and having some mild dyspnea and chest pain.  Given his persistent  symptoms, we decided to proceed with cardiac catheterization.  This was  done in the outpatient laboratory.   PROCEDURES PERFORMED:  1. Selective coronary angiography.  2. Saphenous vein graft angiography x2.  3. Left internal mammary artery angiography.  4. Left heart catheterization.  5. Left ventriculogram.   DESCRIPTION OF PROCEDURE:  The risks and indications were explained.  Consent was signed and placed in the chart.  Prior to this procedure,  the patient was bridged with Lovenox due to his history of atrial  fibrillation.  The right groin area was prepped and draped in routine  sterile fashion.  A 4-French arterial sheath was placed in the right  femoral artery using modified Seldinger technique.  Standard catheters  including a JL-4, 3DRC, and angled pigtail were used for the procedure.  All catheter exchange was made over wire.  There were no apparent  complications.  Central aortic pressure was 111/66 with a mean of 86.  LV pressure was 112/12 with an EDP of 19.  There was no aortic stenosis.   Left main was long, angiographically normal.   LAD had a 50% lesion in the midsection followed by 95% lesion prior to  the second diagonal and the distal LAD filled through  competitive flow.   Left circumflex had a very small OM-1, tiny OM-2, and what looked like  an occluded OM-3, but was hard to tell me.  In the proximal portion of  the OM-1, there was an area which seemed to be tented up which I think  was the insertion of a previous saphenous vein graft.  It appeared to  have about a 60-70% stenosis in that section.   Right coronary artery was a large dominant vessel that gave off a large  PDA with prominent right to left collateral to the distal LAD.  There  was a 30% lesion in the proximal portion of the RCA and 30% lesion  distally.   Sequential saphenous vein graft to the OM-1 and OM-2 was totally  occluded proximally.  Saphenous vein graft to the second diagonal was  widely patent.  It back-filled through the second diagonal and filled  the distal LAD well and also back-filled up to the left main.  In the  ostium of the diagonal, there was about a 40% stenosis.  The LAD  just  after the insertion of the diagonal there was about 50% lesion which was  mildly hazy.   The LIMA to the LAD was atretic.  There were multiple little channels,  it was hard to identify one dominant vessel.   Left ventriculogram done in the RAO position showed an EF of 50-55% with  anterior wall hypokinesis.   ASSESSMENT:  1. Two-vessel coronary artery disease as described above.  2. Saphenous vein graft to the obtuse marginal system is totally      occluded.  3. Left internal mammary artery is atretic.  4. Saphenous vein graft to the diagonal is okay and backfills the left      anterior descending.   PLAN/DISCUSSION:  Despite occlusion of his saphenous vein graft to the  OM system and his atretic LIMA, it is felt that he does appear to have  adequate revascularization.  However, I am a little bit concerned about  possible ischemia in the LAD due to the anterior wall motion abnormality  as well as the persistence of a right-to-left collateral.  We will plan  to get a  Myoview to further evaluate the significance of his lesion in  the mid-LAD just after the diagonal.      Kevin Buckles. Bensimhon, MD  Electronically Signed     DRB/MEDQ  D:  11/02/2008  T:  11/03/2008  Job:  [id number]

## 2010-08-15 NOTE — Assessment & Plan Note (Signed)
[facility] OFFICE NOTE   NAME:Kevin [name], Kevin [name]                          MRN:          [id number]  DATE:10/01/2006                            DOB:          01/12/1956    Mr. Moncus returns today for management of the following issues:  1)  A  history of paroxysmal atrial fibrillation, staying in sinus rhythm on  low-dose beta blocker.  2)  Hyperlipidemia with a baseline LDL of 201.  He has stopped simvastatin last visit because of some fatigue.  It made  no difference.  He is being evaluated for a possible sleep disorder with  Dr. Sandria Manly at present.  Unfortunately, they did not start back on their  simvastatin.  3)  History of cerebrovascular accident, probable embolic  event.  4)  Patent foramen ovale with a shunt.   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  His medicines are unchanged since the last  visit.  Again, he did not start his simvastatin, which was at 20 mg per  day.  He had an LDL of 100 on treatment.   PHYSICAL EXAMINATION:  VITAL SIGNS:  His blood pressure is 116/70.  His  pulse is 62 and regular.  His weight is 202, up 5.  HEENT:  Normocephalic and atraumatic.  PERRLA.  Extraocular movements  are intact.  Sclerae are clear.  Facial symmetry is normal.  NECK:  Carotids are full without bruits.  There is no JVD.  Thyroid is  not enlarged.  Trachea is midline.  LUNGS:  Clear.  HEART:  Regular rate and rhythm without gallop.  ABDOMEN:  Protuberant with good bowel sounds.  EXTREMITIES:  No edema.  Pulses are intact.  NEUROLOGIC:  Intact.   ASSESSMENT/PLAN:  Mr. Botelho is doing well.  I have asked him to follow  throughout on his sleep evaluation with Dr. Sandria Manly.  He will restart the  simvastatin 20 mg p.o. nightly.  I have asked him to get follow-up labs  in 6-8 weeks, which his wife assures me she will do.  I need a copy of  those.  Hopefully, his LDL  will stay at 100 or less.  I will plan on  seeing him back again in six months.     Thomas C. Daleen Squibb, MD, [facility]  Electronically Signed    TCW/MedQ  DD: 10/01/2006  DT: 10/01/2006  Job #: [id number]   cc:   Evie Lacks, MD  Julieanne Manson

## 2010-08-18 NOTE — H&P (Signed)
Kevin [name], Kevin [name] NO.:  [id number]   MEDICAL RECORD NO.:  [id number]          PATIENT TYPE:  EMS   LOCATION:  MAJO                         FACILITY:  [facility]   PHYSICIAN:  Kevin [name], [name]    DATE OF BIRTH:  [birth year]   DATE OF ADMISSION:  09/07/2005  DATE OF DISCHARGE:                                HISTORY & PHYSICAL   This is the second [facility] admission for this 55 year old, right-  handed, white, married male from [place], [place], seen in the  emergency room for evaluation of left hand and left foot numbness.   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  He was seen at [facility] at [facility].  An evaluation revealed a PFO.  His symptomatology  had recovered fully before t-PA was given.  He was placed on aspirin therapy  and did well until Aug 19, 2005, when he developed a right PCA stroke with  left visual field cut, left hand, arm, and leg weakness and numbness, and  MRI showed the new PCA stroke with left PCA occlusion.  There was  involvement of the occipital lobe and the thalamic region.  During that  hospitalization, he was found to have an LDL of 201 with a recheck of 140  and Statin drugs were added to his regimen.  As an outpatient on Aggrenox  25/200 b.i.d.  He was seen by Dr. Romeo [name] at [facility]  and is to have PFO closure next week.  Over the past, he has been getting  better and better with more and more energy but over the last two days has  not felt as well, noted intermittent left hand, ulnar numbness, and dorsal  aspect of the left foot numbness without headache or change in his vision.  He called my office today and is currently seen in the emergency room  for  evaluation.  A CT scan showed evolution of the right PCA stroke from Aug 19, 2005.  .  The patient has been seen by Dr. 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   PAST MEDICAL HISTORY:  1.  Left brain stroke, March 2007.  2.  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  3.  PFO.  4.  Hypertension.  5.  Hyperlipidemia.   MEDICATIONS:  1.  Aggrenox 25/200 t.i.d.  2.  Lopressor 25 mg every day.  3.  Simvastatin 10 mg every day.  4.  Zantac 150 mg every day.  5.  Claritin 10 mg q.h.s.  6.  Lisinopril 2.5 mg q.h.s.  7.  Xanax a half of a 0.25 mg tablet b.i.d.   He does not smoke cigarettes or drink alcohol.   He has no known drug allergies.   He is married.  He has children and works in [employer].   FAMILY HISTORY:  Negative for acute stroke.   PHYSICAL EXAMINATION:  GENERAL:  A well-developed white male.  General  examination was unremarkable.  VITAL SIGNS:  Blood pressure right and left arm 120/80, heart rate 76 and  regular.  There were no bruits.  NEUROLOGIC:  He is alert and oriented x3, followed one, two, and three-step  commands.  Cranial nerve examination revealed  visual fields showing a left  superior quadrantopsia that was incongruous and incomplete.  The face was  symmetric.  Tongue was midline.  The uvula was midline.  Gags are present.  Sternocleidomastoid trapezius testing were normal.  Sensation was equal in  the face.  Motor examination revealed 5/5 strength proximally and distally  in the upper and lower extremities.  No evidence of drift.  Coordination  testing was normal.  Sensory examination was intact including the left hand  ulnar nerve distribution and left foot.  Deep tendon reflexes were 2+.  Plantar responses were downgoing.  There was no enlargement of liver,  spleen, or kidneys.  EARS:  His tympanic membranes were clear.  LUNGS:  Clear.  HEART:  Revealed no murmurs.  ABDOMEN:  Bowel sounds  were normal.  EXTREMITIES:  There is no cyanosis, clubbing, edema.  He did have evidence  of psoriasis.   IMPRESSION:  1.  Left-sided numbness, code 782.0.  I suspect thalamic waxing and waning      without new stroke.  2.  Old left brain stroke, code 73.1, old right brain stroke 434.01.  3.  Hypertension, code 796.2.  4.  Hyperlipidemia, code 272.4.  5.  PFO, code unknown.   PLAN:  At this time, to obtain MRI study and if positive for new stroke  admit for heparin therapy.           ______________________________  Kevin Churn. Sandria Manly, [name]     JML/MEDQ  D:  09/07/2005  T:  09/07/2005  Job:  [id number]

## 2010-08-18 NOTE — Procedures (Signed)
EEG NUMBER:  07-05   [age]   DATE OF BIRTH:  [birth year]   CLINICAL HISTORY:  This patient is being evaluated for the possibility of a  right brain stroke and has a previous history of left brain stroke and PFO.   TECHNICAL DESCRIPTION:  This EEG was recorded during the awake and during  the drowsy states.  The background activity shows an intermixture of well-  formed alpha rhythms at approximately 11-Hz activity with some low-voltage  fast beta activity predominantly in the anterior head regions.  Drowsiness  is recorded during this EEG and intermittently there is some posterior right-  sided temporal slowing without associated epileptiform activity.  Photic  stimulation was performed which did not produce any evidence of a driving  response.  Hyperventilation testing was performed which also did not produce  any abnormalities.   Although drowsiness was recorded, there was no evidence of any stage II  sleep seen.   IMPRESSION:  This is a mildly abnormal EEG showing some right posterior  temporal slowing occurring during portions of this record without any  definite epileptiform activity or other focal abnormality present.  These  finding should be taken into account in the clinical state of the patient,  but would be compatible with a destructive lesion in the right posterior  temporal area.           ______________________________  Genene Churn. Sandria Manly, [name]     EAV:[id number]  D:  08/21/2005 16:29:49  T:  08/22/2005 07:07:08  Job #:  [id number]   cc:   Santina Evans A. Orlin Hilding, [name]  Fax: [phone]

## 2010-08-18 NOTE — Assessment & Plan Note (Signed)
[facility]                              CARDIOLOGY OFFICE NOTE   NAME:Kevin [name], Kevin [name]                          MRN:          [id number]  DATE:01/10/2006                            DOB:          [birth year]    Kevin [name] returns today for further management of the following issues:  1. History of two cerebrovascular accidents, embolic events.  2. Patent foramen ovale with a shunt by TEE, as documented in previous      evaluations.  3. 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  4. Paroxysmal atrial fibrillation.   See the note from [name] on December 07, 2005.   Because of some exertional jaw discomfort and symptoms of jaw discomfort and  chest pressure with symptomatic atrial fib, a stress adenosine Myoview was  obtained.  This was done on December 18, 2005, and showed an EF of 51% with  normal contractility, and thickening of all areas of the myocardium.  There  was normal uptake in all areas of the myocardium, with no evidence of  ischemia or infarction.   Of note, he did have the same symptoms when the adenosine was infused.   He is now in cardiac rehab at [facility].  His wife is the nurse  there.   CURRENT MEDICATIONS:  1. Citalopram 20 mg a day.  2. Alprazolam 0.5 mg daily.  3. Zantac 150 mg daily.  4. Coumadin as directed.  5. Lisinopril 2.5 mg daily.  6. Simvastatin 20 mg a day.  7. Metoprolol 25 mg b.i.d.   PHYSICAL EXAMINATION:  VITAL SIGNS:  His blood pressure today is 94/62, his  pulse is 80 and regular.  His weight is 200.  HEENT:  He is normocephalic, atraumatic.  Facial symmetry is normal.  PERRLA, extraocular movements intact, the sclerae are clear.  Dentition is  normal.  NECK:  Carotid upstrokes are equal bilaterally without bruits, there is no  JVD.  The thyroid is not enlarged.  Trachea is midline.  LUNGS:  Clear.  HEART:  Reveals a regular rate and rhythm without gallop or murmur.  ABDOMEN:  Exam is soft with good bowel sounds.  EXTREMITIES:  Revealed no cyanosis, clubbing or edema.  Pulses are intact.   His Coumadin is being monitored at our clinic here in [place].   ASSESSMENT AND PLAN:  Kevin [name] seems to be doing well on Coumadin and other  risk factor modification for future stroke or cardiovascular events.  We  have had a long talk together with his wife.  I am delighted he is in rehab.   With his low blood pressure and some generalized fatigue, I called Dr. Sandria Manly  to see if we could stop his lisinopril.  He agreed we could.   I will plan on seeing him back in January 2008 in the [place] office.  His wife or he will call me if he begins to have more symptoms of paroxysmal  atrial fibrillation, or other problems.       Thomas C. Wall, MD, [facility]  TCW/MedQ  DD:  01/10/2006  DT:  01/12/2006  Job #:  [id number]   cc:   [name], [name]

## 2010-08-18 NOTE — Discharge Summary (Signed)
Kevin [name], Kevin [name]                   ACCOUNT NO.:  [id number]   MEDICAL RECORD NO.:  [id number]          PATIENT TYPE:  INP   LOCATION:  3011                         FACILITY:  [facility]   PHYSICIAN:  Pramod P. Pearlean Brownie, MD    DATE OF BIRTH:  Aug 03, 1955   DATE OF ADMISSION:  08/19/2005  DATE OF DISCHARGE:  08/22/2005                                 DISCHARGE SUMMARY   DIAGNOSES AT TIME OF DISCHARGE:  1.  Right PCA infarct likely secondary to acute right PCA occlusion.  2.  Patent foramen ovale.  3.  Left brain stroke of March 2007.  4.  Dyslipidemia.  5.  Anxiety.  6.  Hypertension.   MEDICATIONS AT TIME OF DISCHARGE:  1.  Lexapro 10 mg a day.  2.  Lopressor 25 mg a day.  3.  Xanax 0.25 mg b.i.d.  4.  Zantac 150 mg a day.  5.  Aggrenox 1 b.i.d.  6.  Tylenol 650 mg 30 minutes before Aggrenox does x1 week only.  7.  Lipitor 20 mg a day.  8.  Ultram 100 mg q.6h. p.r.n. headache.   STUDIES PERFORMED:  1.  CT of the brain on admission shows no acute infarct.  2.  MRI of the brain shows acute infarct in the hippocampus and right      thalamus, and sinusitis.  3.  MRI of the neck shows no significant carotid stenosis.  However, there      may be an ulcer in the proximal right internal carotid artery, which      could be a source of emboli.  4.  Occluded right posterior cerebral artery compatible with area of an      acute infarct.  Atherosclerotic disease involving the less supracondylar      internal carotid artery, left and right anterior cerebral arteries.  5.  Chest x-ray shows no acute abnormality.  6.  Follow up CT shows interval evolution of the posterior cerebral artery      distribution infarct without evidence of hemorrhage.  7.  EKG is unusual P axis and possible ectopic atrial arrhythmia and      nonspecific T wave abnormalities.  No prior EKG to compare.  8.  A2-D echocardiogram an EF of 65% and no embolic source.  9.  Carotid Doppler.  No stenosis.  10. EEG shows right  posterior temporal fluid and __________ activity.   LABORATORY STUDIES:  Hemoglobin A1c 5.4, homocysteine 12.5, urinalysis  negative, cholesterol 261, triglycerides 108, HDL 38, LDL 201.  There is  some question that this was correct, so cholesterol levels were rechecked at  cholesterol 210, triglyceride 124, HDL 36 and LDL 149.  CBC was normal.  Differential normal.  Coagulation studies on admission normal.  Chemistry  normal.  Liver function tests normal.   HISTORY OF PRESENT ILLNESS:  Kevin [name] is a 55 year old right-handed  white male with a history of left brain, right by the stroke with a clear  etiology in March of 2007 that he was found to have a PFO.  He was treated  aspirin.  He was doing some heavy lifting the day prior to admission.  The  morning of admission, he had abrupt onset of left-sided weakness and  numbness with visual disturbance and was brought to the emergency room as a  code stroke.  PA CT of the head in the emergency room was negative for acute  abnormality.  He was admitted for further stroke workup.   HOSPITAL COURSE:  MRI did reveal a new acute infarct in the right PCA  thought to be secondary to the right PCA occlusion that showed stenosis when  he was in [place].  It was not clear the actual etiology of his throat  though it was not felt to be related to patent foramen ovale, as this was  felt to be an incidental finding.  The patient was changed to Aggrenox for  secondary stroke prevention.  He did have a mild headache with Aggrenox and  was placed on Tylenol pre and Ultram if needed.  He was evaluated by PT and  OT, and was pretty high-level functioning though he does have a new visual  deficit.  He will be assessed at [facility] and followed  their.  In the hospital, his cholesterol levels were also found to be  elevated with an LDL of 201 and a recheck of 149.  The patient's wife had  stated LDL had been in the 100 range in March.   He was placed on a statin to  help lower the.  The patient will be scheduled for a transcranial Doppler  bubble study and emboli monitoring as an outpatient to follow up PFO,  otherwise he will follow up with Dr. Sandria Manly.   CONDITION AT DISCHARGE:  The patient is alert and oriented x3.  Speech  clear.  No aphasia.  He has minimal decreased lower extremity peripheral  field of vision.  He has no other focal deficits.  His gait is steady.   DISCHARGE PLAN:  1.  Discharge home with wife who is a Engineer, civil (consulting) at [employer].  2.  Aggrenox for secondary stroke prevention.  3.  New statin for elevated LDL.  Goal LDL is less than 100 and will need      followed in 4-6 weeks.  4.  Follow up with Dr. Sullivan Lone within 1 month.  5.  Follow up with Dr. Sandria Manly in 2 months.  6.  Outpatient bubble study and emboli monitoring with Dr. Pearlean Brownie.  Office to      call and make arrangements with patient.      Annie Main, N.P.    ______________________________  Sunny Schlein. Pearlean Brownie, MD    SB/MEDQ  D:  08/22/2005  T:  08/22/2005  Job:  [id number]   cc:   [facility]patient [facility]  [address]  [place], [place] [address]   Pramod P. Pearlean Brownie, MD  Fax: [phone]   Dr. _____   Julieanne Manson  Fax: [phone]

## 2010-08-18 NOTE — Assessment & Plan Note (Signed)
[facility] OFFICE NOTE   NAME:Bob, JABEZ MOLNER                          MRN:          [id number]  DATE:07/08/2006                            DOB:          [birth year]    Mr. Altschuler returns today for management of the following issues:   1. History of paroxysmal atrial fibrillation maintaining sinus rhythm      on low dose beta blockade.  2. Anticoagulation.  3. 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  4. Patent foramen ovale with shunt.   He has profound fatigue.  Despite almost finishing cardiac rehab, he  still does not have the getup and go that he once had.  He stays cold  and really does not like being out in the weather unless it is above 60.  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   MEDICATIONS:  His meds are unchanged.  He is on Simvastatin and  metoprolol both of which could be associated with this.   He has some paresthesias that do not make anatomical sense.  He  discussed this with Dr. Sandria Manly.   PHYSICAL EXAMINATION:  VITAL SIGNS:  Blood pressure today is 106/78,  pulse 70 and regular.  EKG shows sinus rhythm with no change.  Weight  197 down 3.  HEENT:  Normocephalic, atraumatic.  PERRLA, extraocular movements  intact, sclerae are clear.  Facial symmetry is normal.  Carotid  upstrokes are equal bilaterally without bruits, no JVD.  Thyroid is not  enlarged.  Trachea is midline.  LUNGS:  Clear.  HEART:  Reveals regular rate and rhythm.  There is no right ventricular  lift.  ABDOMEN:  Soft with good bowel sounds.  EXTREMITIES:  With no edema.  Pulses are intact.   Mr. Enke is really suffering from fatigue.  Some of this may be  psychological, but some of it could be organic related to his  Simvastatin, metoprolol or even his Lexapro.  Stopping the Lexapro is  not an option.   PLAN:  Discontinue Simvastatin for  four weeks.  If this makes a profound  difference, will look for an alternative such as Crestor at low dose.  If it does not, he will start it back and call us.  At that  point we will switch his metoprolol to diltiazem.  I will plan on seeing  him back again in three months.     Thomas C. Daleen Squibb, MD, [facility]  Electronically Signed    TCW/MedQ  DD: 07/09/2006  DT: 07/09/2006  Job #: [id number]   cc:   Genene Churn. Love, [name]  Richard Sullivan Lone

## 2010-08-18 NOTE — H&P (Signed)
Kevin [name], Kevin [name]                   ACCOUNT NO.:  [id number]   MEDICAL RECORD NO.:  [id number]          PATIENT TYPE:  EMS   LOCATION:  [facility]                         FACILITY:  [facility]   PHYSICIAN:  Gustavus Messing. Orlin Hilding, M.D.DATE OF BIRTH:  [birth year]   DATE OF ADMISSION:  08/19/2005  DATE OF DISCHARGE:                                HISTORY & PHYSICAL   CODE STROKE:   SYMPTOM ONSET:  11:40.   PRIMARY NEUROLOGIST:  Genene Churn. Love, [name]   CHIEF COMPLAINT:  Left-sided weakness, numbness, and visual disturbances.   HISTORY OF PRESENT ILLNESS:  Mr. Rigor is a 55 year old right-handed white  male with a previous history of left brain, right body stroke, without clear  etiology, although he was found to have a PFO.  He was treated with aspirin.  He was doing some heavy lifting yesterday.  This morning, he had abrupt  onset of left-sided weakness and numbness and visual disturbance and was  brought in via EMS as a code stroke.   REVIEW OF SYSTEMS:  Negative for headache, chest pain, or shortness of  breath.  He does have some mild nausea.   PAST MEDICAL HISTORY:  Significant for left brain/right body stroke a few  years ago, near full recovery.  Workup was negative except for PFOs on  aspirin.  He sees Dr. Sandria Manly.  He also has hypertension and some depression  and anxiety.   MEDICATIONS:  1.  Metoprolol 25 mg once daily.  2.  Lexapro 10 mg once daily.  3.  Aspirin 325 mg once daily.  4.  Xanax 0.25 mg 1/2 twice daily.   ALLERGIES:  No known drug allergies.   SOCIAL HISTORY:  He is married with children.  Works in [employer].  No  cigarette or alcohol use.   FAMILY HISTORY:  Negative for stroke.   PHYSICAL EXAMINATION:  VITAL SIGNS:  Blood pressure 135/89, pulse 72, 99%  sat.  HEAD:  Normocephalic and atraumatic.  NECK:  Supple without bruits.  HEART:  Regular rate and rhythm.  LUNGS:  Clear to auscultation.   NIH STROKE SCALE:  His score is 4.  1A.  Level of consciousness:   He is awake and alert, 0.  1B.  Answers both questions correctly, gets 0.  1C.  Follows commands, 2/2.  Gets a 0.  1.  Has full gaze, gets a 0.  2.  Has a left homonymous hemianopia.  Gets 2 points for that.  3.  No facial asymmetry.  Gets a 0.      1.  No drift in the left upper extremity, 0.      2.  No drift in the right upper extremity, 0.      3.  Slight in the left lower extremity, 1.      4.  No drift in the right lower extremity, 0.  4.  No ataxia, although he is slower on the left with finger to nose, 0.  5.  Decreased sensation on the left.  Gets 1 point.  6.  No aphasia.  Gets 0.  7.  No dysarthria.  Gets 0.  8.  No neglect or extinction, 0.   CT of the brain shows no acute abnormalities.  I do not even see the old  stroke.   IMPRESSION:  Suspect right brain stroke in a patient with previous left  brain and patent foramen ovale.   PLAN:  Due to his field cut and issues with quality of life, after extensive  discussion with the patient and his wife, will administer t-PA, despite a  fairly low score of 4.  Will admit him to the ICU stroke service.  He will  need MRI/MRA, 2D echo, carotid Doppler, transcranial Doppler, and probably  TEE.      Catherine A. Orlin Hilding, [name]  Electronically Signed     CAW/MEDQ  D:  08/19/2005  T:  08/19/2005  Job:  [id number]

## 2010-08-18 NOTE — Assessment & Plan Note (Signed)
[facility]                              CARDIOLOGY OFFICE NOTE   NAME:Kevin [name], Kevin [name]                            MRN:          [id number]  DATE:12/07/2005                            DOB:          05-22-1955    PRIMARY CARE PHYSICIAN:  Dr. Julieanne Manson.   [facility] CARDIOLOGIST:  Dr. Bernette Redbird.   PRIMARY NEUROLOGIST:  Dr. Avie Echevaria.   SUBJECTIVE:  [name] is a very pleasant, 55 year old male patient who  initially saw Dr. Daleen Squibb back on May 1 after suffering a cerebrovascular  accident.  He had undergone transesophageal echocardiogram that showed a  patent foramen ovale with shunt and degenerative mitral valve disease and  mild mitral regurgitation.  At that point in time, it was felt that the  patient would continue his current medical regimen.  If he did have any  further transient ischemic attacks or CVAs, he would need to be on Coumadin.  Unfortunately, in May 2007, he developed a left homonymous hemianopsia and  left hemiparesis.  He was then placed on Coumadin at that point in time by  Dr. Sandria Manly.  He was referred to Dr. Romeo Apple at [facility] for possible closure of  his PFO.  It was actually planned, at that point in time, to proceed with  closure of his PFO; however, the patient had been having symptoms of  palpitations.  His wife is a Engineer, civil (consulting) at [employer], Cardiac  Rehabilitation.  She got him in for an EKG and it showed atrial  fibrillation.  He clearly is having paroxysms of atrial fibrillation.  He,  at times, does note some jaw discomfort with this.  He also notices jaw  discomfort at other times.  This is most noticeable when he is walking,  especially uphill.  He denies any significant shortness of breath.  He  denies any syncope or presyncope, orthopnea, __________ or dyspnea.  Denies  any arm pain, nausea, or diaphoresis.   CURRENT MEDICATIONS:  1. Citalopram 20 mg a day.  2. Metoprolol 25 mg b.i.d.  3. Alprazolam 0.5 mg  a day.  4. Zantac 150 mg daily.  5. Coumadin as directed.  6. Lisinopril 2.5 mg daily.  7. Simvastatin 20 mg daily.  8. Tylenol p.r.n.  9. Claritin p.r.n.   ALLERGIES:  No known drug allergies.   SOCIAL HISTORY:  The patient denies tobacco abuse.   FAMILY HISTORY:  Significant for coronary disease-his mother had bypass at  age 77 and his father had a heart attack at age 6.   PHYSICAL EXAMINATION:  GENERAL:  He is well-nourished, well-developed.  VITAL SIGNS:  Initially, his blood pressure is 102/70.  Pulse of 83.  Weight  199 pounds.  HEENT:  Unremarkable.  NECK:  Without JVD.  CARDIAC:  Normal S1, S2.  Regular rate and rhythm.  LUNGS:  Clear to auscultation bilaterally.  ABDOMEN:  Soft, nontender with normoactive bowel sounds.  No organomegaly.  EXTREMITIES:  Without edema.  Calves are soft, nontender.  NEUROLOGIC:  He is alert and oriented x3.  Cranial nerves  II-XII are grossly  intact.   Electrocardiogram from July 16 shows atrial fibrillation with a heart rate  of 107.  No ischemic changes.  Electrocardiogram from today reveals ectopic  atrial rhythm with a ventricular rate of 73.  No significant change since  previous tracings.   IMPRESSION:  1. Paroxysmal atrial fibrillation.  2. Exertional jaw pain.  3. Status post cerebrovascular accident (left insular cortical infarct)      March 2007 __________ patent foramen ovale confirmed by transesophageal      echocardiogram.  4. 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  5. Treated dyslipidemia.  6. Family history of coronary artery disease.  7. History of borderline hypertension.  8. Mitral regurgitation.   PLAN:  The patient presents to the office today with complaints of jaw pain  with his episodes of atrial fibrillation as well as with exertion.  He  certainly does have risk factors for coronary disease.  However, given his   necessity for Coumadin therapy at this point in time, I think it is best to  proceed with a functional study.  I discussed this with Dr. Jens Som who  agreed.  We will set him up for an adenosine Myoview to rule out ischemia.  I think his current medical regimen looks good.  His rate, when he is out of  atrial fibrillation, is fairly well-controlled and his blood pressure looks  good.  We will make no changes in his medicines today.  I will bring him  back with Dr. Daleen Squibb in the next few weeks after his stress test is completed.  He is interested in  cardiac rehabilitation, which Dr. Sandria Manly felt was okay and we will go ahead  and make the referral for him for that.                                  [name], PA-C                           Madolyn Frieze. Jens Som, MD, [facility]   SW/MedQ  DD:  12/07/2005  DT:  12/08/2005  Job #:  [id number]   cc:   [name], [name]  Bernette Redbird, MD

## 2010-09-05 ENCOUNTER — Other Ambulatory Visit: Payer: Self-pay | Admitting: Family Medicine

## 2010-09-15 ENCOUNTER — Encounter: Payer: Self-pay | Admitting: Internal Medicine

## 2010-11-02 ENCOUNTER — Telehealth: Payer: Self-pay

## 2010-11-02 MED ORDER — ISOSORBIDE MONONITRATE ER 30 MG PO TB24: 30.0000 mg | ORAL_TABLET | Freq: Every day | ORAL | Status: DC

## 2010-11-02 NOTE — Telephone Encounter (Signed)
Needs a refill on isosorbide mono er 30 mg

## 2010-11-24 ENCOUNTER — Ambulatory Visit: Payer: Self-pay | Admitting: Neurology

## 2010-12-01 ENCOUNTER — Telehealth: Payer: Self-pay

## 2010-12-01 MED ORDER — ROSUVASTATIN CALCIUM 10 MG PO TABS: 10.0000 mg | ORAL_TABLET | Freq: Every day | ORAL | Status: DC

## 2010-12-01 NOTE — Telephone Encounter (Signed)
Needs a refill for crestor sent to [facility].

## 2010-12-20 LAB — PREPARE PLATELET PHERESIS

## 2010-12-21 ENCOUNTER — Ambulatory Visit: Payer: Self-pay | Admitting: Neurology

## 2011-01-19 ENCOUNTER — Encounter: Payer: Self-pay | Admitting: Cardiovascular Disease

## 2011-01-26 ENCOUNTER — Ambulatory Visit (INDEPENDENT_AMBULATORY_CARE_PROVIDER_SITE_OTHER): Payer: No Typology Code available for payment source | Admitting: Cardiovascular Disease

## 2011-01-26 ENCOUNTER — Encounter: Payer: Self-pay | Admitting: Cardiovascular Disease

## 2011-01-26 DIAGNOSIS — R0602 Shortness of breath: Secondary | ICD-10-CM

## 2011-01-26 DIAGNOSIS — I25810 Atherosclerosis of coronary artery bypass graft(s) without angina pectoris: Secondary | ICD-10-CM

## 2011-01-26 DIAGNOSIS — G4733 Obstructive sleep apnea (adult) (pediatric): Secondary | ICD-10-CM

## 2011-01-26 DIAGNOSIS — I6350 Cerebral infarction due to unspecified occlusion or stenosis of unspecified cerebral artery: Secondary | ICD-10-CM

## 2011-01-26 DIAGNOSIS — E785 Hyperlipidemia, unspecified: Secondary | ICD-10-CM

## 2011-01-26 DIAGNOSIS — I639 Cerebral infarction, unspecified: Secondary | ICD-10-CM

## 2011-01-26 NOTE — Progress Notes (Signed)
Patient ID: Kevin [name], male    DOB: 12/20/1955, 55 y.o.   MRN: [id number]  HPI Comments: Kevin [name] is a 55 y/o male with h/o CAD, atrial fib, HTN, HL, OSA and previous CVA,  CABG with Maze and PFO closure in February 2010,  cath in 10/2008  for exertional dyspnea and CP showing 2-V CAD. LAD 95% mid, LCX small RCA large OK.  SVG -> OM1 and OM2 was occluded. SVG-Diag was widely patent and backfilled the LAD well. LIMA - LAD was atretic. Myoview to assess LAD for ischemia with very mild reversible defect in very distal anterior wall and apex. Decision made to manage him medically, continued chest pain and cath Aug 9th 2011 with PCI of the LAD with 2 stents placed. he presents for routine followup.  He reports that he had no significant chest pain. He does have occasional shortness of breath. He is doing mainly a desk job and paperwork. He is limited by memory problems and having to repeat work that he is already done. He no longer is exercising and stopped physical therapy as he was not showing up on a regular basis. He reports that he did recently signed up at the gym at [facility].  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  He is forgetful, and cannot remember what he does during the daytime. He has not been able to exert himself and perform the heavy physical activities required by his job.  Previous attempts to increase his isosorbide has caused dizziness. Weight has increased 5 pounds due to inactivity.  EKG shows normal sinus rhythm with rate of 54 beats per minute, incomplete right bundle-branch block, left axis deviation   Outpatient Encounter Prescriptions as of 01/26/2011  Medication Sig Dispense Refill  . acetaminophen (TYLENOL) 500 MG tablet Take 500 mg by mouth as needed.        . ALPRAZolam (XANAX) 0.5 MG tablet Take 0.5 mg by mouth at bedtime as needed.        Marland Kitchen aspirin 325 MG tablet Take 325 mg by mouth daily.        . cetirizine (ZYRTEC) 10  MG tablet Take 10 mg by mouth daily.        Marland Kitchen escitalopram (LEXAPRO) 10 MG tablet Take 20 mg by mouth daily.        . isosorbide mononitrate (IMDUR) 30 MG 24 hr tablet Take 1 tablet (30 mg total) by mouth daily.  30 tablet  6  . metoprolol (LOPRESSOR) 50 MG tablet Take 50 mg by mouth 2 (two) times daily.        . nitroGLYCERIN (NITROSTAT) 0.4 MG SL tablet Place 0.4 mg under the tongue every 5 (five) minutes as needed. May repeat for up to 3 doses.       . prasugrel (EFFIENT) 10 MG TABS Take 1 tablet (10 mg total) by mouth daily.  30 tablet  6  . ranitidine (ZANTAC) 300 MG capsule Take 300 mg by mouth every evening.        . ranolazine (RANEXA) 1000 MG SR tablet Take 1 tablet (1,000 mg total) by mouth 2 (two) times daily.  180 tablet  6  . rosuvastatin (CRESTOR) 10 MG tablet Take 1 tablet (10 mg total) by mouth daily.  30 tablet  6    Review of Systems  Constitutional: Negative.   HENT: Negative.   Eyes: Negative.   Respiratory: Negative.   Cardiovascular:  Negative for palpitations and leg swelling.  Gastrointestinal: Negative.   Musculoskeletal: Negative.   Skin: Negative.   Neurological: Negative.   Hematological: Negative.   Psychiatric/Behavioral: Positive for sleep disturbance and dysphoric mood. Negative for behavioral problems and confusion.       Memory problems  All other systems reviewed and are negative.     BP 108/62  Pulse 54  Ht 5\' 6"  (1.676 m)  Wt 179 lb (81.194 kg)  BMI 28.89 kg/m2  Physical Exam  Nursing note and vitals reviewed. Constitutional: He is oriented to person, place, and time. He appears well-developed and well-nourished.  HENT:  Head: Normocephalic.  Nose: Nose normal.  Mouth/Throat: Oropharynx is clear and moist.  Eyes: Conjunctivae are normal. Pupils are equal, round, and reactive to light.  Neck: Normal range of motion. Neck supple. No JVD present.  Cardiovascular: Normal rate, regular rhythm, normal heart sounds and intact distal pulses.   Exam reveals no gallop and no friction rub.   No murmur heard. Pulmonary/Chest: Effort normal and breath sounds normal. No respiratory distress. He has no wheezes. He has no rales. He exhibits no tenderness.  Abdominal: Soft. Bowel sounds are normal. He exhibits no distension. There is no tenderness.  Musculoskeletal: Normal range of motion. He exhibits no edema and no tenderness.  Lymphadenopathy:    He has no cervical adenopathy.  Neurological: He is alert and oriented to person, place, and time. Coordination normal.  Skin: Skin is warm and dry. No rash noted. No erythema.  Psychiatric: He has a normal mood and affect. His behavior is normal. Judgment and thought content normal.     Assessment and Plan

## 2011-01-26 NOTE — Assessment & Plan Note (Signed)
He reports residual memory deficits from his stroke that make it difficult to work.

## 2011-01-26 NOTE — Patient Instructions (Signed)
You are doing well. No medication changes were made.  Please call us if you have new issues that need to be addressed before your next appt.  The office will contact you for a follow up Appt. In 6 months  

## 2011-01-26 NOTE — Assessment & Plan Note (Signed)
Rare episodes of shortness of breath that come on at rest. Likely atypical in nature. We've asked him to restart exercise.

## 2011-01-26 NOTE — Assessment & Plan Note (Signed)
He has completed a recent sleep study, CPAP face mask fitting and continues to have bowel problems. He will followup through the center at [facility]

## 2011-01-26 NOTE — Assessment & Plan Note (Signed)
Cholesterol is at goal on the current lipid regimen. No changes to the medications were made.  

## 2011-01-26 NOTE — Assessment & Plan Note (Signed)
Rare atypical type chest pain, no significant symptoms with exertion. No further testing plan.

## 2011-01-30 ENCOUNTER — Telehealth: Payer: Self-pay

## 2011-01-30 MED ORDER — PRASUGREL HCL 10 MG PO TABS: 10.0000 mg | ORAL_TABLET | Freq: Every day | ORAL | Status: DC

## 2011-01-30 NOTE — Telephone Encounter (Signed)
Refill sent for effient. 

## 2011-03-13 ENCOUNTER — Other Ambulatory Visit: Payer: Self-pay | Admitting: Cardiovascular Disease

## 2011-03-13 MED ORDER — METOPROLOL TARTRATE 50 MG PO TABS: 50.0000 mg | ORAL_TABLET | Freq: Two times a day (BID) | ORAL | Status: DC

## 2011-03-13 NOTE — Telephone Encounter (Signed)
Refill sent for metoprolol.  

## 2011-03-13 NOTE — Telephone Encounter (Signed)
PT IS OUT OF  MEDS

## 2011-05-09 ENCOUNTER — Telehealth: Payer: Self-pay

## 2011-05-09 MED ORDER — NITROGLYCERIN 0.4 MG SL SUBL: 0.4000 mg | SUBLINGUAL_TABLET | SUBLINGUAL | Status: DC | PRN

## 2011-05-09 NOTE — Telephone Encounter (Signed)
Refill sent for Nitrostat 0.4 mg  

## 2011-06-21 ENCOUNTER — Other Ambulatory Visit: Payer: Self-pay | Admitting: *Deleted

## 2011-06-21 ENCOUNTER — Ambulatory Visit: Payer: Self-pay | Admitting: Family Medicine

## 2011-06-21 MED ORDER — ISOSORBIDE MONONITRATE ER 30 MG PO TB24: 30.0000 mg | ORAL_TABLET | Freq: Every day | ORAL | Status: DC

## 2011-07-23 ENCOUNTER — Other Ambulatory Visit: Payer: Self-pay | Admitting: Cardiovascular Disease

## 2011-07-23 MED ORDER — ROSUVASTATIN CALCIUM 10 MG PO TABS: 10.0000 mg | ORAL_TABLET | Freq: Every day | ORAL | Status: DC

## 2011-07-23 NOTE — Telephone Encounter (Signed)
Refilled Crestor. 

## 2011-08-02 ENCOUNTER — Encounter: Payer: Self-pay | Admitting: Cardiovascular Disease

## 2011-08-02 ENCOUNTER — Ambulatory Visit (INDEPENDENT_AMBULATORY_CARE_PROVIDER_SITE_OTHER): Payer: No Typology Code available for payment source | Admitting: Cardiovascular Disease

## 2011-08-02 VITALS — BP 114/75 | HR 63 | Ht 66.0 in | Wt 180.0 lb

## 2011-08-02 DIAGNOSIS — E663 Overweight: Secondary | ICD-10-CM

## 2011-08-02 DIAGNOSIS — I25810 Atherosclerosis of coronary artery bypass graft(s) without angina pectoris: Secondary | ICD-10-CM

## 2011-08-02 DIAGNOSIS — E785 Hyperlipidemia, unspecified: Secondary | ICD-10-CM

## 2011-08-02 DIAGNOSIS — I4891 Unspecified atrial fibrillation: Secondary | ICD-10-CM

## 2011-08-02 NOTE — Assessment & Plan Note (Signed)
Continue Crestor 10 mg daily. We did suggest he could possibly increase to 20 mg daily to obtain an LDL less than 70. He would like to stay at 10 mg for now.

## 2011-08-02 NOTE — Patient Instructions (Signed)
You are doing well. No medication changes were made.  Please call us if you have new issues that need to be addressed before your next appt.  Your physician wants you to follow-up in: 6 months.  You will receive a reminder letter in the mail two months in advance. If you don't receive a letter, please call our office to schedule the follow-up appointment.   

## 2011-08-02 NOTE — Assessment & Plan Note (Signed)
He is maintaining normal sinus rhythm on his current medications.

## 2011-08-02 NOTE — Progress Notes (Signed)
Patient ID: Kevin [name], male    DOB: 1955-09-23, 56 y.o.   MRN: [id number]  HPI Comments: Kevin [name] is a 56 y/o male with h/o CAD, atrial fib, HTN, hyperlipidemia, OSA and previous CVA,  CABG with Maze and PFO closure in February 2010,  cath in 10/2008  for exertional dyspnea and CP showing 2-V CAD. LAD 95% mid, LCX small RCA large OK.  SVG -> OM1 and OM2 was occluded. SVG-Diag was widely patent and backfilled the LAD well. LIMA - LAD was atretic. Myoview to assess LAD for ischemia with very mild reversible defect in very distal anterior wall and apex. Decision made to manage him medically, continued chest pain and cath Aug 9th 2011 with PCI of the LAD with 2 stents placed. he presents for routine followup.  He reports that he is doing long hours at work, sometimes 70 hours. This is predominantly desk work. He's not reactive, does not do any regular exercise. He does have occasional chest tightness though this has been a chronic issue. He has significant stress at home. 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 He's not taking nitroglycerin sublingual for any chest discomfort unless it is very severe. No recent use of nitroglycerin. His weight has been stable over the past 6 months.  EKG shows normal sinus rhythm with rate of 59 beats per minute,  left axis deviation   Outpatient Encounter Prescriptions as of 08/02/2011  Medication Sig Dispense Refill  . acetaminophen (TYLENOL) 500 MG tablet Take 500 mg by mouth as needed.        . ALPRAZolam (XANAX) 0.5 MG tablet Take 0.5 mg by mouth at bedtime as needed.        Marland Kitchen aspirin 325 MG tablet Take 325 mg by mouth daily.        . cetirizine (ZYRTEC) 10 MG tablet Take 10 mg by mouth daily.        Marland Kitchen escitalopram (LEXAPRO) 10 MG tablet Take 20 mg by mouth daily.        . isosorbide mononitrate (IMDUR) 30 MG 24 hr tablet Take 1 tablet (30 mg total) by mouth daily.  30 tablet  6  . metoprolol (LOPRESSOR) 50 MG tablet Take 1  tablet (50 mg total) by mouth 2 (two) times daily.  60 tablet  6  . nitroGLYCERIN (NITROSTAT) 0.4 MG SL tablet Place 1 tablet (0.4 mg total) under the tongue every 5 (five) minutes as needed. May repeat for up to 3 doses.  25 tablet  6  . prasugrel (EFFIENT) 10 MG TABS Take 1 tablet (10 mg total) by mouth daily.  30 tablet  6  . ranitidine (ZANTAC) 300 MG capsule Take 300 mg by mouth every evening.        . ranolazine (RANEXA) 1000 MG SR tablet Take 1 tablet (1,000 mg total) by mouth 2 (two) times daily.  180 tablet  6  . rosuvastatin (CRESTOR) 10 MG tablet Take 1 tablet (10 mg total) by mouth daily.  30 tablet  3   Review of Systems  Constitutional: Negative.   HENT: Negative.   Eyes: Negative.   Respiratory: Negative.   Cardiovascular: Positive for chest pain.  Gastrointestinal: Negative.   Musculoskeletal: Negative.   Skin: Negative.   Neurological: Negative.   Hematological: Negative.   Psychiatric/Behavioral: Positive for sleep disturbance. Negative for behavioral problems and confusion.       Memory problems  All other systems reviewed and  are negative.     BP 114/75  Pulse 63  Ht 5\' 6"  (1.676 m)  Wt 180 lb (81.647 kg)  BMI 29.05 kg/m2  Physical Exam  Nursing note and vitals reviewed. Constitutional: He is oriented to person, place, and time. He appears well-developed and well-nourished.  HENT:  Head: Normocephalic.  Nose: Nose normal.  Mouth/Throat: Oropharynx is clear and moist.  Eyes: Conjunctivae are normal. Pupils are equal, round, and reactive to light.  Neck: Normal range of motion. Neck supple. No JVD present.  Cardiovascular: Normal rate, regular rhythm, normal heart sounds and intact distal pulses.  Exam reveals no gallop and no friction rub.   No murmur heard. Pulmonary/Chest: Effort normal and breath sounds normal. No respiratory distress. He has no wheezes. He has no rales. He exhibits no tenderness.  Abdominal: Soft. Bowel sounds are normal. He exhibits  no distension. There is no tenderness.  Musculoskeletal: Normal range of motion. He exhibits no edema and no tenderness.  Lymphadenopathy:    He has no cervical adenopathy.  Neurological: He is alert and oriented to person, place, and time. Coordination normal.  Skin: Skin is warm and dry. No rash noted. No erythema.  Psychiatric: He has a normal mood and affect. His behavior is normal. Judgment and thought content normal.     Assessment and Plan

## 2011-08-02 NOTE — Assessment & Plan Note (Signed)
Stable rare episodes of chest tightness, sometimes with activity, sometimes with stress.No new medication changes . We have suggested he take nitroglycerin when necessary .

## 2011-08-02 NOTE — Assessment & Plan Note (Signed)
We have recommended that he start a regular exercise program.  We have encouraged continued exercise, careful diet management in an effort to lose weight.

## 2011-09-06 ENCOUNTER — Other Ambulatory Visit: Payer: Self-pay | Admitting: *Deleted

## 2011-09-06 MED ORDER — PRASUGREL HCL 10 MG PO TABS: 10.0000 mg | ORAL_TABLET | Freq: Every day | ORAL | Status: DC

## 2011-09-06 NOTE — Telephone Encounter (Signed)
Refilled Effient. 

## 2011-10-19 ENCOUNTER — Other Ambulatory Visit: Payer: Self-pay | Admitting: *Deleted

## 2011-10-19 MED ORDER — RANOLAZINE ER 1000 MG PO TB12: 1000.0000 mg | ORAL_TABLET | Freq: Two times a day (BID) | ORAL | Status: DC

## 2011-10-19 NOTE — Telephone Encounter (Signed)
Refilled Ranexa

## 2011-11-13 ENCOUNTER — Telehealth: Payer: Self-pay

## 2011-11-13 NOTE — Telephone Encounter (Signed)
Pt complaining of 6 sharp jabs in his mid chest (sternal) across to the left over 15 minutes.  He denies sob or sweating but is having nausea.  He developed tingling in his fingers (right hand) after the pain.  He states his pulse is regular but doesn't know the rate.  He does not know the rate and does not know his bp.  The pain started about 10 minutes after getting up this am and started while he was standing still.  He states he is calling because he " just doesn't feel right".

## 2011-11-13 NOTE — Telephone Encounter (Signed)
Sent to [employer], Charity fundraiser covering for her information

## 2011-11-13 NOTE — Telephone Encounter (Signed)
Per Dr Mariah Milling, pt can either wait and see how his pain acts or can have cardiac enzymes done at [facility] or can come in for an ekg or a combination.  Kevin [name] states he will call his wife to get her opinion and will call back to let us know what he prefers to do.

## 2011-11-15 NOTE — Telephone Encounter (Signed)
Called pt and states he is feeling better. Didn't know whether it was a "24 hour bug " Reassurance given. Mylo Red RN

## 2011-11-23 ENCOUNTER — Other Ambulatory Visit: Payer: Self-pay | Admitting: *Deleted

## 2011-11-23 MED ORDER — ROSUVASTATIN CALCIUM 10 MG PO TABS: 10.0000 mg | ORAL_TABLET | Freq: Every day | ORAL | Status: DC

## 2011-11-23 NOTE — Telephone Encounter (Signed)
Refilled Crestor. 

## 2011-12-04 ENCOUNTER — Ambulatory Visit (INDEPENDENT_AMBULATORY_CARE_PROVIDER_SITE_OTHER): Payer: No Typology Code available for payment source | Admitting: Cardiovascular Disease

## 2011-12-04 ENCOUNTER — Encounter: Payer: Self-pay | Admitting: *Deleted

## 2011-12-04 ENCOUNTER — Telehealth: Payer: Self-pay

## 2011-12-04 ENCOUNTER — Encounter: Payer: Self-pay | Admitting: Cardiovascular Disease

## 2011-12-04 VITALS — BP 84/62 | HR 64 | Ht 66.0 in | Wt 186.0 lb

## 2011-12-04 DIAGNOSIS — R0602 Shortness of breath: Secondary | ICD-10-CM

## 2011-12-04 DIAGNOSIS — R0789 Other chest pain: Secondary | ICD-10-CM

## 2011-12-04 DIAGNOSIS — I4891 Unspecified atrial fibrillation: Secondary | ICD-10-CM

## 2011-12-04 DIAGNOSIS — I25810 Atherosclerosis of coronary artery bypass graft(s) without angina pectoris: Secondary | ICD-10-CM

## 2011-12-04 NOTE — Progress Notes (Signed)
HPI  Kevin [name] is a 56 y/o male who is a patient of Dr. Mariah Milling who is here for urgent evaluation regarding chest pain. He has multiple medical conditions that include  CAD, atrial fib, HTN, hyperlipidemia, OSA and previous CVA, CABG with Maze and PFO closure in February 2010, cath in 10/2008 for exertional dyspnea and CP showing 2-V CAD. LAD 95% mid, LCX small RCA large OK. SVG -> OM1 and OM2 was occluded. SVG-Diag was widely patent and backfilled the LAD well. LIMA - LAD was atretic. Myoview to assess LAD for ischemia with very mild reversible defect in very distal anterior wall and apex. Decision made to manage him medically, continued chest pain and cath Aug 9th 2011 with PCI of the LAD with 2 stents placed.  No significant chest pain since then up until recently. About 2 weeks ago he had some substernal chest tightness at rest which resolved spontaneously. He had some milder episodes since then with substernal discomfort that can happen both at rest and with activities. He went [place] the mountains this [place] and then felt out of breath while walking there. He is not sure if this is due to the weight gain over the last 6 months. He gained about 12 pounds. He is not exercising in a regular basis. The chest tightness but not always happen with activity. He has noticed some discomfort at night.  Allergies  Allergen Reactions  . Dilaudid (Hydromorphone Hcl)   . Strawberry     vomiting     Current Outpatient Prescriptions on File Prior to Visit  Medication Sig Dispense Refill  . acetaminophen (TYLENOL) 500 MG tablet Take 500 mg by mouth as needed.        . ALPRAZolam (XANAX) 0.5 MG tablet Take 0.5 mg by mouth at bedtime as needed.        Marland Kitchen aspirin 325 MG tablet Take 325 mg by mouth daily.        . cetirizine (ZYRTEC) 10 MG tablet Take 10 mg by mouth daily.        Marland Kitchen escitalopram (LEXAPRO) 10 MG tablet Take 20 mg by mouth daily.        . isosorbide mononitrate (IMDUR) 30 MG 24 hr tablet Take 1 tablet  (30 mg total) by mouth daily.  30 tablet  6  . metoprolol (LOPRESSOR) 50 MG tablet Take 1 tablet (50 mg total) by mouth 2 (two) times daily.  60 tablet  6  . nitroGLYCERIN (NITROSTAT) 0.4 MG SL tablet Place 1 tablet (0.4 mg total) under the tongue every 5 (five) minutes as needed. May repeat for up to 3 doses.  25 tablet  6  . prasugrel (EFFIENT) 10 MG TABS Take 1 tablet (10 mg total) by mouth daily.  30 tablet  6  . ranitidine (ZANTAC) 300 MG capsule Take 300 mg by mouth every evening.        . ranolazine (RANEXA) 1000 MG SR tablet Take 1 tablet (1,000 mg total) by mouth 2 (two) times daily.  180 tablet  6  . rosuvastatin (CRESTOR) 10 MG tablet Take 1 tablet (10 mg total) by mouth daily.  30 tablet  3     Past Medical History  Diagnosis Date  . CHF (congestive heart failure)   . Atrial fibrillation     s/p Maze  . Hyperlipidemia   . Sleep apnea   . Thrombocytopenia   . CAD (coronary artery disease)     s/p CABG  . Overweight   .  Patent foramen ovale     s/p closure     Past Surgical History  Procedure Date  . Patent foramen ovale closure   . Coronary artery bypass graft 05/12/2008    x4 by PeterVan Trigt,MD  . Cholecystectomy 08/19/2009     Family History  Problem Relation Age of Onset  . Coronary artery disease Other   . Diabetes Other   . Hypertension Other   . Hyperlipidemia Other   . Diabetes Sister      History   Social History  . Marital Status: Married    Spouse Name: N/A    Number of Children: N/A  . Years of Education: N/A   Occupational History  . Full time    Social History Main Topics  . Smoking status: Never Smoker   . Smokeless tobacco: Not on file  . Alcohol Use: No  . Drug Use: No  . Sexually Active: Not on file   Other Topics Concern  . Not on file   Social History Narrative   Married     PHYSICAL EXAM   BP 84/62  Pulse 64  Ht 5\' 6"  (1.676 m)  Wt 186 lb (84.369 kg)  BMI 30.02 kg/m2  Constitutional: He is oriented to  person, place, and time. He appears well-developed and well-nourished. No distress.  HENT: No nasal discharge.  Head: Normocephalic and atraumatic.  Eyes: Pupils are equal and round. Right eye exhibits no discharge. Left eye exhibits no discharge.  Neck: Normal range of motion. Neck supple. No JVD present. No thyromegaly present.  Cardiovascular: Normal rate, regular rhythm, normal heart sounds and. Exam reveals no gallop and no friction rub. No murmur heard.  Pulmonary/Chest: Effort normal and breath sounds normal. No stridor. No respiratory distress. He has no wheezes. He has no rales. He exhibits no tenderness.  Abdominal: Soft. Bowel sounds are normal. He exhibits no distension. There is no tenderness. There is no rebound and no guarding.  Musculoskeletal: Normal range of motion. He exhibits no edema and no tenderness.  Neurological: He is alert and oriented to person, place, and time. Coordination normal.  Skin: Skin is warm and dry. No rash noted. He is not diaphoretic. No erythema. No pallor.  Psychiatric: He has a normal mood and affect. His behavior is normal. Judgment and thought content normal.      EKG: Sinus  Rhythm  -  Nonspecific T-abnormality.   ABNORMAL     ASSESSMENT AND PLAN

## 2011-12-04 NOTE — Patient Instructions (Addendum)
Your physician has requested that you have en exercise stress myoview. For further information please visit [email or web address] Please follow instruction sheet, as given.  Do not take Metoprolol the morning of stress test.

## 2011-12-04 NOTE — Telephone Encounter (Signed)
Pt's wife called to say pt is continuing to have chest discomfort associated with DOE (worse when waling on an incline)- in mountains this w/e. Has hx stents and CABG and is very concerned.  Pt called 8/13 with same complaint and symptoms improved on their own.  They are becoming more frequent, almost daily.  Wife does not want pt to wait until October appt with Dr. Mariah Milling.  I explained Dr. Mariah Milling not available today but Dr. Kirke Corin has availability at 3 pm. Wife ok with this. Pt to see Dr. Kirke Corin today at 3 pm.  In the meantime, wife understands to have pt go to ER should symptoms get worse/change prior to appt.

## 2011-12-04 NOTE — Assessment & Plan Note (Addendum)
The patient reports recurrent intermittent chest pain over the last few weeks with some anginal and some atypical features. He does have known history of coronary artery disease status post CABG and stenting of the LAD. Thus, I recommend further evaluation with a treadmill nuclear stress test. I asked him to hold his metoprolol the morning of the stress test.  His blood pressure is low today but according to him and his wife this is unusual. The low blood pressure at night might be responsible for the chest pain at that time. I will see how his blood pressure during the stress test and consider decreasing the dose of metoprolol.

## 2011-12-04 NOTE — Assessment & Plan Note (Signed)
He is in sinus rhythm and has not had any atrial fibrillation since his Maze procedure.

## 2011-12-07 ENCOUNTER — Ambulatory Visit: Payer: Self-pay | Admitting: Cardiovascular Disease

## 2011-12-07 DIAGNOSIS — R079 Chest pain, unspecified: Secondary | ICD-10-CM

## 2011-12-11 ENCOUNTER — Telehealth: Payer: Self-pay | Admitting: *Deleted

## 2011-12-11 NOTE — Telephone Encounter (Signed)
LMTCB to inform pt of normal rest stress myoview.

## 2011-12-11 NOTE — Telephone Encounter (Signed)
Informed pt of normal rest stress myoview.

## 2011-12-25 ENCOUNTER — Other Ambulatory Visit: Payer: Self-pay | Admitting: Cardiovascular Disease

## 2011-12-25 DIAGNOSIS — R0789 Other chest pain: Secondary | ICD-10-CM

## 2012-01-24 ENCOUNTER — Ambulatory Visit: Payer: No Typology Code available for payment source | Admitting: Cardiovascular Disease

## 2012-01-25 ENCOUNTER — Other Ambulatory Visit: Payer: Self-pay | Admitting: *Deleted

## 2012-01-25 MED ORDER — ISOSORBIDE MONONITRATE ER 30 MG PO TB24: 30.0000 mg | ORAL_TABLET | Freq: Every day | ORAL | Status: DC

## 2012-01-25 NOTE — Telephone Encounter (Signed)
Refilled Isosorbide. 

## 2012-02-12 ENCOUNTER — Encounter: Payer: Self-pay | Admitting: Cardiovascular Disease

## 2012-02-12 ENCOUNTER — Ambulatory Visit (INDEPENDENT_AMBULATORY_CARE_PROVIDER_SITE_OTHER): Payer: No Typology Code available for payment source | Admitting: Cardiovascular Disease

## 2012-02-12 VITALS — BP 112/62 | HR 53 | Ht 66.0 in | Wt 186.0 lb

## 2012-02-12 DIAGNOSIS — I25810 Atherosclerosis of coronary artery bypass graft(s) without angina pectoris: Secondary | ICD-10-CM

## 2012-02-12 DIAGNOSIS — I4891 Unspecified atrial fibrillation: Secondary | ICD-10-CM

## 2012-02-12 DIAGNOSIS — E785 Hyperlipidemia, unspecified: Secondary | ICD-10-CM

## 2012-02-12 DIAGNOSIS — R42 Dizziness and giddiness: Secondary | ICD-10-CM

## 2012-02-12 DIAGNOSIS — R413 Other amnesia: Secondary | ICD-10-CM

## 2012-02-12 NOTE — Progress Notes (Signed)
Patient ID: Kevin [name], male    DOB: Jun 26, 1955, 56 y.o.   MRN: [id number]  HPI Comments: Kevin [name] is a 56 y/o male with h/o CAD, atrial fib, HTN, hyperlipidemia, OSA and previous CVA,  CABG with Maze and PFO closure in February 2010,  cath in 10/2008  for exertional dyspnea and CP showing 2-V CAD. LAD 95% mid, LCX small RCA large OK.  SVG -> OM1 and OM2 was occluded. SVG-Diag was widely patent and backfilled the LAD well. LIMA - LAD was atretic. Myoview to assess LAD for ischemia with very mild reversible defect in very distal anterior wall and apex. Decision made to manage him medically, continued chest pain and cath Aug 9th 2011 with PCI of the LAD with 2 stents placed. he presents for routine followup.  Recent episodes of chest pain with stress test several months ago showing no ischemia. Since then, chest pain has been stable requiring periodic nitroglycerin. He is trying to be more active, has changed his activities at work and now does not sits, stands only. He does not do regular exercise, only walks his dog. Occasional dizzy episodes. He has to stand slowly. He does have a gym membership at [facility] but does not go work out.  EKG shows normal sinus rhythm with rate of 53 beats per minute,  left axis deviation   Outpatient Encounter Prescriptions as of 02/12/2012  Medication Sig Dispense Refill  . acetaminophen (TYLENOL) 500 MG tablet Take 500 mg by mouth as needed.        . ALPRAZolam (XANAX) 0.5 MG tablet Take 0.5 mg by mouth at bedtime as needed.        Marland Kitchen aspirin 325 MG tablet Take 325 mg by mouth daily.        . cetirizine (ZYRTEC) 10 MG tablet Take 10 mg by mouth daily.        Marland Kitchen escitalopram (LEXAPRO) 10 MG tablet Take 20 mg by mouth daily.        . isosorbide mononitrate (IMDUR) 30 MG 24 hr tablet Take 1 tablet (30 mg total) by mouth daily.  30 tablet  6  . metoprolol (LOPRESSOR) 50 MG tablet Take 1 tablet (50 mg total) by mouth 2 (two) times daily.  60 tablet  6  . nitroGLYCERIN  (NITROSTAT) 0.4 MG SL tablet Place 1 tablet (0.4 mg total) under the tongue every 5 (five) minutes as needed. May repeat for up to 3 doses.  25 tablet  6  . prasugrel (EFFIENT) 10 MG TABS Take 1 tablet (10 mg total) by mouth daily.  30 tablet  6  . ranitidine (ZANTAC) 300 MG capsule Take 300 mg by mouth every evening.        . ranolazine (RANEXA) 1000 MG SR tablet Take 1 tablet (1,000 mg total) by mouth 2 (two) times daily.  180 tablet  6  . rosuvastatin (CRESTOR) 10 MG tablet Take 1 tablet (10 mg total) by mouth daily.  30 tablet  3   Review of Systems  Constitutional: Negative.   HENT: Negative.   Eyes: Negative.   Respiratory: Negative.   Cardiovascular: Positive for chest pain.  Gastrointestinal: Negative.   Musculoskeletal: Negative.   Skin: Negative.   Neurological: Negative.   Hematological: Negative.   Psychiatric/Behavioral: Positive for sleep disturbance. Negative for behavioral problems and confusion.       Memory problems  All other systems reviewed and are negative.    BP 112/62  Pulse 53  Ht 5'  6" (1.676 m)  Wt 186 lb (84.369 kg)  BMI 30.02 kg/m2  Physical Exam  Nursing note and vitals reviewed. Constitutional: He is oriented to person, place, and time. He appears well-developed and well-nourished.  HENT:  Head: Normocephalic.  Nose: Nose normal.  Mouth/Throat: Oropharynx is clear and moist.  Eyes: Conjunctivae normal are normal. Pupils are equal, round, and reactive to light.  Neck: Normal range of motion. Neck supple. No JVD present.  Cardiovascular: Normal rate, regular rhythm, normal heart sounds and intact distal pulses.  Exam reveals no gallop and no friction rub.   No murmur heard. Pulmonary/Chest: Effort normal and breath sounds normal. No respiratory distress. He has no wheezes. He has no rales. He exhibits no tenderness.  Abdominal: Soft. Bowel sounds are normal. He exhibits no distension. There is no tenderness.  Musculoskeletal: Normal range of  motion. He exhibits no edema and no tenderness.  Lymphadenopathy:    He has no cervical adenopathy.  Neurological: He is alert and oriented to person, place, and time. Coordination normal.  Skin: Skin is warm and dry. No rash noted. No erythema.  Psychiatric: He has a normal mood and affect. His behavior is normal. Judgment and thought content normal.     Assessment and Plan

## 2012-02-12 NOTE — Assessment & Plan Note (Signed)
Chronic stable episodes of chest pain relieved with nitroglycerin. No further workup at this time. Recent normal stress test.

## 2012-02-12 NOTE — Patient Instructions (Addendum)
You are doing well. No medication changes were made.  Please take nitro as needed for chest pain Please call the office if you have more dizziness Try isosorbide at night Possibly decreasing metoprolol to 37.g mg twice a day for dizziness  Please call us if you have new issues that need to be addressed before your next appt.  Your physician wants you to follow-up in: 6 months.  You will receive a reminder letter in the mail two months in advance. If you don't receive a letter, please call our office to schedule the follow-up appointment.

## 2012-02-12 NOTE — Assessment & Plan Note (Signed)
Cholesterol is at goal on the current lipid regimen. No changes to the medications were made.  

## 2012-02-12 NOTE — Assessment & Plan Note (Signed)
He sees Dr. love in [place]. Symptoms slowly getting worse.

## 2012-02-12 NOTE — Assessment & Plan Note (Signed)
Maintaining normal sinus rhythm 

## 2012-03-13 ENCOUNTER — Other Ambulatory Visit: Payer: Self-pay

## 2012-03-13 MED ORDER — PRASUGREL HCL 10 MG PO TABS: 10.0000 mg | ORAL_TABLET | Freq: Every day | ORAL | Status: DC

## 2012-03-13 NOTE — Telephone Encounter (Signed)
Refill sent for effient.

## 2012-03-27 ENCOUNTER — Other Ambulatory Visit: Payer: Self-pay | Admitting: *Deleted

## 2012-03-27 MED ORDER — METOPROLOL TARTRATE 50 MG PO TABS: 50.0000 mg | ORAL_TABLET | Freq: Two times a day (BID) | ORAL | Status: DC

## 2012-03-27 MED ORDER — ROSUVASTATIN CALCIUM 10 MG PO TABS: 10.0000 mg | ORAL_TABLET | Freq: Every day | ORAL | Status: DC

## 2012-03-27 NOTE — Telephone Encounter (Signed)
Refilled Metoprolol and crestor.

## 2012-08-08 ENCOUNTER — Other Ambulatory Visit: Payer: Self-pay | Admitting: *Deleted

## 2012-08-08 MED ORDER — ROSUVASTATIN CALCIUM 10 MG PO TABS: 10.0000 mg | ORAL_TABLET | Freq: Every day | ORAL | Status: DC

## 2012-08-08 NOTE — Telephone Encounter (Signed)
Refilled crestor sent to [facility].

## 2012-08-14 ENCOUNTER — Other Ambulatory Visit: Payer: Self-pay | Admitting: *Deleted

## 2012-08-14 MED ORDER — ISOSORBIDE MONONITRATE ER 30 MG PO TB24: 30.0000 mg | ORAL_TABLET | Freq: Every day | ORAL | Status: DC

## 2012-08-14 MED ORDER — PRASUGREL HCL 10 MG PO TABS: 10.0000 mg | ORAL_TABLET | Freq: Every day | ORAL | Status: DC

## 2012-08-14 NOTE — Telephone Encounter (Signed)
Refilled Effient sent to [id number] Refill#1

## 2012-08-14 NOTE — Telephone Encounter (Signed)
Refilled Isosorbide sent to [id number] Refill#1.

## 2012-08-20 ENCOUNTER — Ambulatory Visit: Payer: No Typology Code available for payment source | Admitting: Cardiovascular Disease

## 2012-09-24 ENCOUNTER — Encounter: Payer: Self-pay | Admitting: Cardiovascular Disease

## 2012-09-24 ENCOUNTER — Encounter: Payer: Self-pay | Admitting: Neurology

## 2012-09-24 ENCOUNTER — Ambulatory Visit (INDEPENDENT_AMBULATORY_CARE_PROVIDER_SITE_OTHER): Payer: 59 | Admitting: Neurology

## 2012-09-24 VITALS — BP 95/64 | HR 60 | Temp 97.6°F | Resp 14 | Ht 65.0 in | Wt 188.0 lb

## 2012-09-24 DIAGNOSIS — G4710 Hypersomnia, unspecified: Secondary | ICD-10-CM

## 2012-09-24 HISTORY — DX: Hypersomnia, unspecified: G47.10

## 2012-09-24 NOTE — Progress Notes (Signed)
[facility]  Provider:  Dr Seve [name] Referring Provider: Julieanne Manson, MD Primary Care Physician:  Kevin Clos, MD  Chief Complaint  Patient presents with  . Follow-up    home sleep study results, rm 11    HPI:  Kevin [name] is a 57 y.o. male here as a referral from Kevin [name].  Patient was originally referred for hypersomnia by Kevin [name]  03-21-12, after  he reported being noncompliant with CPAP.  At the time I ordered a home sleep test which returned as an AHI of less than 5 and basically would not qualify the patient for CPAP use or doesn't indicate the need of CPAP  . 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 Again meanwhile his apnea index is less than 5 and therefore not longer in need of CPAP treatment. Would have been present isn't elevated or excessive daytime sleepiness with Epworth score is 17/24 at the time of his home sleep test as ordered by Kevin [name], and also today he again and Kevin [name] an Epworth sleepiness score of 13 point less than in last December but still slightly elevated.  Goes  to bed at 10:30 PM,  falls asleep very quickly and  arises in the morning at 7:30. He may have one or none bathroom break. And intermittent slowing only the patient does not snore himself awake. The patient reports that if he has a chance to take a nap he will and it'll take between 2-1/2 hours. He seems to be at his sleep he is between 11 AM and 2 was 3 PM. The patient also falls regularly asleep in front of the TV at night at about 6:30 PM . He states that he cannot stay awake until  the news.  I was able today to review all sleep study if the whole sleep test, the previous polysomnography from 11-24-10 and CPAP interpretation from 9-22,012. Kevin [name] is the referring physician for all 3 studies. The patient was no able to lose weight , was 170 pounds in 2012 now 188 lbs.   His past medical history is summarized here in short:  Patient suffered a left brain stroke in March 2007 at age 18, another stroke in the right posterior cerebral artery on 08-21-2005. 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 He underwent a coronary artery disease 4 vessel bypass surgery and patch closure by Kevin [name]  on 2-15 2010. At the time he had complained of intermittent diplopia as a residual from his stroke symptoms,  he also had cognitive issues. A  neuropsychological battery testing in September 2010 showed that his cognitive abilities were well in the average range and his executive function was intact. But he had deficits for complex visual attention, shifting attention and multitasking multiple stimuli applied simultaneously did result in confusion. His memory findings were typical of of right brain dysfunction .   The patient is a Psychologist, sport and exercise ( flooring)  and noted that he becomes more fatigued and probably less attentive with the ongoing day  at work.  Toward the end of the day he would often develop a tinnitus which he feels distracts him . He feels that: Naps in the morning or early afternoon can help him to sustain productivity through the day, he feels less fatigued less distractible and more able to concentrate.    Review of Systems: Out of a complete 14 system review, the patient complains of only the following symptoms, and all other reviewed systems are negative. EDS,  Epworth 13 points.  Tinnitus, leg burning,    History   Social History  . Marital Status: Married    Spouse Name:  Kevin [name]    Number of Children: 3  . Years of Education: Kevin [name]   Occupational History  . Full time    Social History Main Topics  . Smoking status: Never Smoker   . Smokeless tobacco: Not on file  . Alcohol Use: No  . Drug Use: No  . Sexually Active: Not on file   Other Topics Concern  . 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 He has worked at his present job for 22 years. He has been married for 34 years. They have 3 children. 2 of his daughters live in [place]. He drinks less than two cups of caffeine per day. He does not use  tobacco or recreational drugs. He has rare alcohol intake. Lives with wife and daughter      OSA diagnosed at [facility] , had his  sleep studies there  reviewed them all in detail today he  has retrognathia, sinusitis,  rhinitis      His ESS remains very high  at 16 and FSS at 32 . His falls assessment tool score is 5.   nasonex, refitted mask, the recent  HST failed to document that  apnea is present at all. education about REM BD and EDS, narcolepsy , cataplexy.  45 minutes.     Family History  Problem Relation Age of Onset  . Coronary artery disease Other   . Diabetes Other   . Hypertension Other   . Hyperlipidemia Other   . Diabetes Sister   . Psychiatric Illness Father   . Cancer Paternal Uncle   . Heart disease Paternal Grandmother   . Heart disease Paternal Grandfather   . Diabetes Other     Past Medical History  Diagnosis Date  . CHF (congestive heart failure)   . Atrial fibrillation     s/p Maze  . Hyperlipidemia   . Sleep apnea   . Thrombocytopenia   . CAD (coronary artery disease)     s/p CABG  . Overweight(278.02)   . Patent foramen ovale     s/p closure  . Stroke     right brain, 07/2005,left brain, 05/2005  . ASD (atrial septal defect)   . Hypertension   . Coronary artery disease     status post  two-vessel to bypass surgery  . H/O maze procedure 05/17/08    closure of ASD  . Brachial plexopathy   . OSA (obstructive sleep apnea)   . Psoriatic arthritis     Past Surgical History  Procedure Laterality Date  . Patent foramen ovale closure    . Coronary artery bypass graft  05/12/2008    x4 by PeterVan Trigt,MD  . Cholecystectomy  08/19/2009  . Tonsillectomy      AS A CHILD  .  Appendectomy  1984  . Acute pancreatitis  5/11  . Stints  8/11    X2    Current Outpatient Prescriptions  Medication Sig Dispense Refill  . acetaminophen (TYLENOL) 500 MG tablet Take 500 mg by mouth as needed.        . ALPRAZolam (XANAX) 0.5 MG tablet Take 0.5 mg by mouth at bedtime as needed.        Marland Kitchen aspirin 325 MG tablet Take 325 mg by mouth daily.        . cetirizine (ZYRTEC) 10 MG tablet Take 10 mg by mouth daily.        Marland Kitchen escitalopram (LEXAPRO) 20 MG tablet Take 20 mg by mouth daily.      . isosorbide mononitrate (IMDUR) 30 MG 24 hr tablet Take 1 tablet (30 mg total) by mouth daily.  90 tablet  1  . metoprolol (LOPRESSOR) 50 MG tablet Take 1 tablet (50 mg total) by mouth 2 (two) times daily.  60 tablet  6  . nitroGLYCERIN (NITROSTAT) 0.4 MG SL tablet Place 1 tablet (0.4 mg total) under the tongue every 5 (five) minutes as needed. May repeat for up to 3 doses.  25 tablet  6  . prasugrel (EFFIENT) 10 MG TABS Take 1 tablet (10 mg total) by mouth daily.  90 tablet  1  . ranitidine (ZANTAC) 300 MG capsule Take 300 mg by mouth every evening.        . ranolazine (RANEXA) 1000 MG SR tablet Take 1 tablet (1,000 mg total) by mouth 2 (two) times daily.  180 tablet  6  . rosuvastatin (CRESTOR) 10 MG tablet Take 1 tablet (10 mg total) by mouth daily.  30 tablet  3  . triamcinolone (NASACORT AQ) 55 MCG/ACT nasal inhaler Place 2 sprays into the nose daily. Use spray in each nostril       No current facility-administered medications for this visit.    Allergies as of 09/24/2012 - Review Complete 09/24/2012   Allergen Reaction Noted  . Dilaudid (hydromorphone hcl)  06/21/2010  . Strawberry  12/04/2011    Vitals: BP 95/64  Pulse 60  Temp(Src) 97.6 F (36.4 C) (Oral)  Resp 14  Ht 5\' 5"  (1.651 m)  Wt 188 lb (85.276 kg)  BMI 31.28 kg/m2 Last Weight:  Wt Readings from Last 1 Encounters:  09/24/12 188 lb (85.276 kg)   Last Height:   Ht Readings from Last 1 Encounters:  09/24/12 5\' 5"  (1.651 m)     Physical exam:  General: The patient is awake, alert and appears not in acute distress. The patient is well groomed. Head: Normocephalic, atraumatic. Neck is supple. Mallampati 3 , neck circumference:  14.5  inches ,  Retrognathia  Cardiovascular:  Regular rate and rhythm, without  murmurs or carotid bruit, and without distended neck veins. Respiratory: Lungs are clear to auscultation. Skin:  Without evidence of edema, or rash Trunk: BMI is still  elevated , patient  has normal posture.  Neurologic exam : The patient is awake and alert, oriented to place and time.   Cranial nerves: Pupils are equal and briskly reactive to light. Funduscopic exam withoutevidence of pallor or edema. Extraocular movements  in vertical and horizontal planes intact and without nystagmus. Visual fields by finger perimetry are intact. Hearing to finger rub intact.  Facial sensation intact to fine touch. Facial motor strength is symmetric and tongue and uvula move midline.  Motor exam:  Normal tone and normal muscle bulk , he has  slightly decreased left sided strength in upper and lower extremities when tested in late PM. . 5/5 here, no drift.   Sensory:  Fine touch, pinprick and vibration were tested in all extremities. Proprioception is normal. Left hand feels gloved and clumsy at times.   Coordination: Rapid alternating movements in the fingers/hands is tested and normal. Finger-to-nose maneuver without evidence of ataxia, dysmetria or tremor.  Gait and station: Patient walks without assistive device, he is  reporting a limp on the left when fatigued.  Strength within normal limits.   The patient denies any recent palpitations he does not have lower extremity edema and actually begins a fairly active lifestyle. He feels that his excessive daytime sleepiness has improved and by Epworth sleepiness scale his house. From 17 down to 13 points. We had discussed in our last visit that I would recommend a dental device as alternative  to CPAP,  should snoring be detrimental to his sleep quality.  Snoring has not been reported by either the patient nor his wife and his mild degree of apnea may not need other treatment and avoiding supine sleep position.  The patient struggles with fatigue, but I do attribute to the right brain dysfunction since his  Stroke 2007 . His cognitive issues were in detail outlined in Kevin. Imagene Gurney previous progress notes. He is fatigable and he is struggling with multitasking. He learnt to compensate with naps and breaks.  With his CAD history, I  would not place him on NUVIGIL or relatives, not on Adderall or ritalin.   The tennis ball method was again discussed. Defer to cardiology for  Evaluation of stamina, and PCP for work and exercise tolerance.

## 2012-09-24 NOTE — Patient Instructions (Signed)
Hypersomnia Hypersomnia usually brings recurrent episodes of excessive daytime sleepiness or prolonged nighttime sleep. It is different than feeling tired due to lack of or interrupted sleep at night. People with hypersomnia are compelled to nap repeatedly during the day. This is often at inappropriate times such as:  At work.  During a meal.  In conversation. These daytime naps usually provide no relief. This disorder typically affects adolescents and young adults. CAUSES  This condition may be caused by:  Another sleep disorder (such as narcolepsy or sleep apnea).  Dysfunction of the autonomic nervous system.  Drug or alcohol abuse.  A physical problem, such as:  A tumor.  Head trauma. This is damage caused by an accident.  Injury to the central nervous system.  Certain medications, or medicine withdrawal.  Medical conditions may contribute to the disorder, including:  Multiple sclerosis.  Depression.  Encephalitis.  Epilepsy.  Obesity.  Some people appear to have a genetic predisposition to this disorder. In others, there is no known cause. SYMPTOMS   Patients often have difficulty waking from a long sleep. They may feel dazed or confused.  Other symptoms may include:  Anxiety.  Increased irritation (inflammation).  Decreased energy.  Restlessness.  Slow thinking.  Slow speech.  Loss of appetite.  Hallucinations.  Memory difficulty.  Tremors, Tics.  Some patients lose the ability to function in family, social, occupational, or other settings. TREATMENT  Treatment is symptomatic in nature. Stimulants and other drugs may be used to treat this disorder. Changes in behavior may help. For example, avoid night work and social activities that delay bed time. Changes in diet may offer some relief. Patients should avoid alcohol and caffeine. PROGNOSIS  The likely outcome (prognosis) for persons with hypersomnia depends on the cause of the disorder.  The disorder itself is not life threatening. But it can have serious consequences. For example, automobile accidents can be caused by falling asleep while driving. The attacks usually continue indefinitely. Document Released: 03/09/2002 Document Revised: 06/11/2011 Document Reviewed: 02/11/2008 [facility] Patient Information 2014 [place], [place]. Stroke Prevention Some health problems and behaviors may make it more likely for you to have a stroke. Below are ways to lessen your risk of having a stroke.   Be active for at least 30 minutes on most or all days.  Do not smoke. Try not to be around others who smoke (secondhand smoke).  Do not drink too much alcohol.  Eat healthy foods, such as fruits and vegetables. If you were put on a specific diet, follow the diet as told.  Keep your cholesterol levels under control through diet and medicines. Look for foods that are low in saturated fat, trans fat, cholesterol, and are high in fiber.  If you have diabetes, follow all diet plans and take your medicine as told.  If you have high blood pressure (hypertension), follow all diet plans and take your medicine as told.  Keep a healthy weight. Eat foods that are low in calories, salt, saturated fat, trans fat, and cholesterol.  Do not take drugs.  Avoid birth control pills, if this applies. Talk to your doctor about the risks of taking birth control pills.  Talk to your doctor if you have sleep problems (sleep apnea).  Take all medicine as told by your doctor.  You may be told to take aspirin or blood thinner medicine. Take this medicine as told.  Understand your medicine instructions. GET HELP RIGHT AWAY IF:  You lose feeling (numbness) or have weakness in  the face, arm, or leg.  You lose feeling or have weakness on one side of the body.  You feel suddenly confused.  You have trouble talking or understanding what people are saying.  You have sudden trouble seeing in one or both  eyes.  You have sudden trouble walking.  You are dizzy.  You lose your balance or your movements are clumsy (uncoordinated).  You suddenly have a very bad headache, and you do not know the cause.  You have new chest pain.  Your heart feels like it is fluttering or skipping a beat (irregular heartbeat). Do not wait to see if the symptoms above go away. Get help right away. Call your local emergency services (911 in [place] Do not drive yourself to the hospital. Document Released: 09/18/2011 Document Reviewed: 09/18/2011 [facility] Patient Information 2014 [place], [place].

## 2012-09-30 ENCOUNTER — Telehealth: Payer: Self-pay

## 2012-09-30 NOTE — Telephone Encounter (Signed)
I called Patient Insurance Disability office and notified them that forms delayed due to Primary physician retired and patient saw new attending on September 24 2012. We are now awaiting MD signature then will submit. I was informed that was no problem and I could mail or fax forms.  I called patient and informed him of above and status.

## 2012-10-01 ENCOUNTER — Encounter: Payer: Self-pay | Admitting: Cardiovascular Disease

## 2012-10-01 ENCOUNTER — Telehealth: Payer: Self-pay

## 2012-10-01 ENCOUNTER — Ambulatory Visit (INDEPENDENT_AMBULATORY_CARE_PROVIDER_SITE_OTHER): Payer: 59 | Admitting: Cardiovascular Disease

## 2012-10-01 VITALS — BP 112/64 | HR 55 | Ht 66.0 in | Wt 187.0 lb

## 2012-10-01 DIAGNOSIS — I25810 Atherosclerosis of coronary artery bypass graft(s) without angina pectoris: Secondary | ICD-10-CM

## 2012-10-01 DIAGNOSIS — R079 Chest pain, unspecified: Secondary | ICD-10-CM

## 2012-10-01 DIAGNOSIS — I2510 Atherosclerotic heart disease of native coronary artery without angina pectoris: Secondary | ICD-10-CM

## 2012-10-01 DIAGNOSIS — E785 Hyperlipidemia, unspecified: Secondary | ICD-10-CM

## 2012-10-01 DIAGNOSIS — R413 Other amnesia: Secondary | ICD-10-CM

## 2012-10-01 DIAGNOSIS — I4891 Unspecified atrial fibrillation: Secondary | ICD-10-CM

## 2012-10-01 NOTE — Assessment & Plan Note (Signed)
Stable chronic angina. I suggested he call us if symptoms get more frequent, more severe, work he requires more nitroglycerin. Per the patient, currently relatively stable as it has been in the past.

## 2012-10-01 NOTE — Patient Instructions (Addendum)
You are doing well. No medication changes were made.  Please come in for labs, fasting next week  Please call us if you have new issues that need to be addressed before your next appt.  Your physician wants you to follow-up in: 6 months.  You will receive a reminder letter in the mail two months in advance. If you don't receive a letter, please call our office to schedule the follow-up appointment.

## 2012-10-01 NOTE — Progress Notes (Signed)
Patient ID: HUMZAH HARTY, male    DOB: Jan 21, 1956, 57 y.o.   MRN: [id number]  HPI Comments: Mr. Tallman is a 57 y/o male with h/o CAD, atrial fib, HTN, hyperlipidemia, OSA and previous CVA,  CABG with Maze and PFO closure in February 2010,  cath in 10/2008  for exertional dyspnea and CP showing 2-V CAD. LAD 95% mid, LCX small RCA large OK.  SVG -> OM1 and OM2 was occluded. SVG-Diag was widely patent and backfilled the LAD well. LIMA - LAD was atretic. Myoview to assess LAD for ischemia with very mild reversible defect in very distal anterior wall and apex. Decision made to manage him medically, continued chest pain and cath Aug 9th 2011 with PCI of the LAD with 2 stents placed. he presents for routine followup.  He continues to have periodic episodes of chest pain. This has been a chronic issue for him. He rarely takes nitroglycerin for severe chest pain. He is trying to be more active, He does not do regular exercise, only walks his dog.  He does not feel that has been a significant progression in his symptoms. Chest pain is about 3 nights per week, "the same as before". He does report significant mental slowing from his stroke. Things that use to take him in our, taking 3 hours.  EKG shows normal sinus rhythm with rate of 55 beats per minute,  left axis deviation   Outpatient Encounter Prescriptions as of 10/01/2012  Medication Sig Dispense Refill  . acetaminophen (TYLENOL) 500 MG tablet Take 500 mg by mouth as needed.        . ALPRAZolam (XANAX) 0.5 MG tablet Take 0.5 mg by mouth at bedtime as needed.        Marland Kitchen aspirin 325 MG tablet Take 325 mg by mouth daily.        . cetirizine (ZYRTEC) 10 MG tablet Take 10 mg by mouth daily.        Marland Kitchen escitalopram (LEXAPRO) 20 MG tablet Take 20 mg by mouth daily.      . isosorbide mononitrate (IMDUR) 30 MG 24 hr tablet Take 1 tablet (30 mg total) by mouth daily.  90 tablet  1  . metoprolol (LOPRESSOR) 50 MG tablet Take 1 tablet (50 mg total) by mouth 2 (two) times  daily.  60 tablet  6  . nitroGLYCERIN (NITROSTAT) 0.4 MG SL tablet Place 1 tablet (0.4 mg total) under the tongue every 5 (five) minutes as needed. May repeat for up to 3 doses.  25 tablet  6  . prasugrel (EFFIENT) 10 MG TABS Take 1 tablet (10 mg total) by mouth daily.  90 tablet  1  . ranitidine (ZANTAC) 300 MG capsule Take 300 mg by mouth every evening.        . ranolazine (RANEXA) 1000 MG SR tablet Take 1 tablet (1,000 mg total) by mouth 2 (two) times daily.  180 tablet  6  . rosuvastatin (CRESTOR) 10 MG tablet Take 1 tablet (10 mg total) by mouth daily.  30 tablet  3  . triamcinolone (NASACORT AQ) 55 MCG/ACT nasal inhaler Place 2 sprays into the nose daily. Use spray in each nostril       No facility-administered encounter medications on file as of 10/01/2012.   Review of Systems  Constitutional: Negative.   HENT: Negative.   Eyes: Negative.   Respiratory: Negative.   Cardiovascular: Positive for chest pain.  Gastrointestinal: Negative.   Musculoskeletal: Negative.   Skin: Negative.  Neurological: Negative.   Psychiatric/Behavioral: Positive for sleep disturbance. Negative for behavioral problems and confusion.       Memory problems  All other systems reviewed and are negative.    BP 112/64  Pulse 55  Ht 5\' 6"  (1.676 m)  Wt 187 lb (84.823 kg)  BMI 30.2 kg/m2  Physical Exam  Nursing note and vitals reviewed. Constitutional: He is oriented to person, place, and time. He appears well-developed and well-nourished.  HENT:  Head: Normocephalic.  Nose: Nose normal.  Mouth/Throat: Oropharynx is clear and moist.  Eyes: Conjunctivae are normal. Pupils are equal, round, and reactive to light.  Neck: Normal range of motion. Neck supple. No JVD present.  Cardiovascular: Normal rate, regular rhythm, S1 normal, S2 normal, normal heart sounds and intact distal pulses.  Exam reveals no gallop and no friction rub.   No murmur heard. Pulmonary/Chest: Effort normal and breath sounds normal.  No respiratory distress. He has no wheezes. He has no rales. He exhibits no tenderness.  Abdominal: Soft. Bowel sounds are normal. He exhibits no distension. There is no tenderness.  Musculoskeletal: Normal range of motion. He exhibits no edema and no tenderness.  Lymphadenopathy:    He has no cervical adenopathy.  Neurological: He is alert and oriented to person, place, and time. Coordination normal.  Skin: Skin is warm and dry. No rash noted. No erythema.  Psychiatric: He has a normal mood and affect. His behavior is normal. Judgment and thought content normal.      Assessment and Plan      Assessment and Plan

## 2012-10-01 NOTE — Assessment & Plan Note (Signed)
Maintaining normal sinus rhythm on his current medications.

## 2012-10-01 NOTE — Assessment & Plan Note (Signed)
This has been a chronic issue. He feels there has been slight progression

## 2012-10-01 NOTE — Assessment & Plan Note (Signed)
We have suggested he come in next week for lab work. Goal LDL less than 70

## 2012-10-01 NOTE — Telephone Encounter (Signed)
Called and spoke with patient. He reports that he has signed a release some time ago. He does not remember if he paid for the form. I will have Medical Records call him and let him know status then I will fax forms.

## 2012-10-09 ENCOUNTER — Ambulatory Visit (INDEPENDENT_AMBULATORY_CARE_PROVIDER_SITE_OTHER): Payer: 59

## 2012-10-09 DIAGNOSIS — I2510 Atherosclerotic heart disease of native coronary artery without angina pectoris: Secondary | ICD-10-CM

## 2012-10-09 DIAGNOSIS — E785 Hyperlipidemia, unspecified: Secondary | ICD-10-CM

## 2012-10-09 DIAGNOSIS — R079 Chest pain, unspecified: Secondary | ICD-10-CM

## 2012-10-10 LAB — LIPID PANEL
Chol/HDL Ratio: 3.7 ratio units (ref 0.0–5.0)
Cholesterol, Total: 148 mg/dL (ref 100–199)
LDL Calculated: 80 mg/dL (ref 0–99)
Triglycerides: 142 mg/dL (ref 0–149)
VLDL Cholesterol Cal: 28 mg/dL (ref 5–40)

## 2012-10-10 LAB — HEPATIC FUNCTION PANEL
ALT: 19 IU/L (ref 0–44)
AST: 19 IU/L (ref 0–40)

## 2012-10-29 ENCOUNTER — Other Ambulatory Visit: Payer: Self-pay

## 2012-10-29 MED ORDER — RANOLAZINE ER 1000 MG PO TB12: 1000.0000 mg | ORAL_TABLET | Freq: Two times a day (BID) | ORAL | Status: DC

## 2012-10-29 NOTE — Telephone Encounter (Signed)
Refill sent for ranexa  

## 2012-12-11 ENCOUNTER — Other Ambulatory Visit: Payer: Self-pay | Admitting: *Deleted

## 2012-12-11 MED ORDER — METOPROLOL TARTRATE 50 MG PO TABS: 50.0000 mg | ORAL_TABLET | Freq: Two times a day (BID) | ORAL | Status: DC

## 2012-12-11 MED ORDER — ROSUVASTATIN CALCIUM 10 MG PO TABS: 10.0000 mg | ORAL_TABLET | Freq: Every day | ORAL | Status: DC

## 2012-12-11 NOTE — Telephone Encounter (Signed)
Refilled Metoprolol sent to [facility].

## 2012-12-11 NOTE — Telephone Encounter (Signed)
Refilled Crestor sent to [facility].

## 2013-03-02 ENCOUNTER — Other Ambulatory Visit: Payer: Self-pay | Admitting: *Deleted

## 2013-03-02 MED ORDER — ISOSORBIDE MONONITRATE ER 30 MG PO TB24: 30.0000 mg | ORAL_TABLET | Freq: Every day | ORAL | Status: DC

## 2013-03-02 NOTE — Telephone Encounter (Signed)
Requested Prescriptions   Signed Prescriptions Disp Refills  . isosorbide mononitrate (IMDUR) 30 MG 24 hr tablet 90 tablet 1    Sig: Take 1 tablet (30 mg total) by mouth daily.    Authorizing Provider: GOLLAN, TIMOTHY J    Ordering User: LOPEZ, MARINA C    

## 2013-03-13 ENCOUNTER — Telehealth: Payer: Self-pay

## 2013-03-13 ENCOUNTER — Ambulatory Visit (INDEPENDENT_AMBULATORY_CARE_PROVIDER_SITE_OTHER): Payer: 59 | Admitting: Cardiovascular Disease

## 2013-03-13 ENCOUNTER — Encounter: Payer: Self-pay | Admitting: Cardiovascular Disease

## 2013-03-13 VITALS — BP 104/72 | HR 51 | Ht 66.0 in | Wt 193.0 lb

## 2013-03-13 DIAGNOSIS — R079 Chest pain, unspecified: Secondary | ICD-10-CM | POA: Insufficient documentation

## 2013-03-13 DIAGNOSIS — I639 Cerebral infarction, unspecified: Secondary | ICD-10-CM

## 2013-03-13 DIAGNOSIS — E785 Hyperlipidemia, unspecified: Secondary | ICD-10-CM

## 2013-03-13 DIAGNOSIS — E663 Overweight: Secondary | ICD-10-CM

## 2013-03-13 DIAGNOSIS — I2510 Atherosclerotic heart disease of native coronary artery without angina pectoris: Secondary | ICD-10-CM

## 2013-03-13 DIAGNOSIS — I6350 Cerebral infarction due to unspecified occlusion or stenosis of unspecified cerebral artery: Secondary | ICD-10-CM

## 2013-03-13 MED ORDER — ROSUVASTATIN CALCIUM 20 MG PO TABS: 20.0000 mg | ORAL_TABLET | Freq: Every day | ORAL | Status: DC

## 2013-03-13 NOTE — Patient Instructions (Addendum)
You are doing well. Please cut the metoprolol in 1/2 twice a day  Call the office if blood pressures runs low   Please call if you would like a cardiac cath  Please call us if you have new issues that need to be addressed before your next appt.  Your physician wants you to follow-up in: 6 months.  You will receive a reminder letter in the mail two months in advance. If you don't receive a letter, please call our office to schedule the follow-up appointment.

## 2013-03-13 NOTE — Telephone Encounter (Signed)
Pt wife called and states pt has had angina symptoms over the last several days.Please call.work [phone] if unable to reach on cell.

## 2013-03-13 NOTE — Assessment & Plan Note (Addendum)
Suggested he continue on his Crestor. We'll suggest she increase up to 20 mg daily

## 2013-03-13 NOTE — Telephone Encounter (Signed)
Left detailed message on pt's voicemail that his LDL 80 and needs to be <70, so Dr. Mariah Milling is increasing his Crestor to 20mg  daily, rx already sent to pharmacy. Asked pt to call w/ any questions or concerns.

## 2013-03-13 NOTE — Assessment & Plan Note (Addendum)
He continues to report more chest pain. This waxes and wanes, possibly worse recently though he is uncertain. We did offer cardiac catheterization. He would like to try to restart exercise to see if symptoms get worse. He does have significant stress at work. No medication changes made. Very Subtle EKG changes noted in the inferior leads, could consider old inferior MI but does not quite meet criteria.

## 2013-03-13 NOTE — Assessment & Plan Note (Signed)
Severe diffuse disease. Today with atypical with typical anginal features. Discussed various options with him including cardiac catheterization.

## 2013-03-13 NOTE — Progress Notes (Signed)
Patient ID: Kevin [name], male    DOB: 12/16/1955, 57 y.o.   MRN: [id number]  HPI Comments: Kevin [name] is a 57 y/o male with h/o CAD, atrial fib, HTN, hyperlipidemia, OSA and previous CVA,  CABG with Maze and PFO closure in February 2010,  cath in 10/2008  for exertional dyspnea and CP showing 2-V CAD. LAD 95% mid, LCX small RCA large OK.  SVG -> OM1 and OM2 was occluded. SVG-Diag was widely patent and backfilled the LAD well. LIMA - LAD was atretic. Myoview to assess LAD for ischemia with very mild reversible defect in very distal anterior wall and apex. Decision made to manage him medically, continued chest pain and cath Aug 9th 2011 with PCI of the LAD with 2 stents placed. he presents for routine followup.  He continues to have periodic episodes of chest pain. He and his wife think this is worse recently. He has increased stress at work, now working 7 days per week. Also reports having occasional dizziness. Blood pressure running low, heart rate running low. Has burning in his legs when he does steps or hills. He has gained 6 pounds since July 2014. Wife reports memory continues to be a problem. They have a relative living with them in their house, this has caused increased stress He has not been doing his regular exercise  Stress test September 2013 showed no ischemia  EKG shows normal sinus rhythm with rate of 51 beats per minute,  left axis deviation, old inferior MI   Outpatient Encounter Prescriptions as of 03/13/2013  Medication Sig  . acetaminophen (TYLENOL) 500 MG tablet Take 500 mg by mouth as needed.    . ALPRAZolam (XANAX) 0.5 MG tablet Take 0.5 mg by mouth at bedtime as needed.    Marland Kitchen aspirin 325 MG tablet Take 325 mg by mouth daily.    . cetirizine (ZYRTEC) 10 MG tablet Take 10 mg by mouth daily.    Marland Kitchen escitalopram (LEXAPRO) 20 MG tablet Take 20 mg by mouth daily.  . isosorbide mononitrate (IMDUR) 30 MG 24 hr tablet Take 1 tablet (30 mg total) by mouth daily.  . metoprolol (LOPRESSOR)  50 MG tablet Take 1 tablet (50 mg total) by mouth 2 (two) times daily.  . nitroGLYCERIN (NITROSTAT) 0.4 MG SL tablet Place 1 tablet (0.4 mg total) under the tongue every 5 (five) minutes as needed. May repeat for up to 3 doses.  . prasugrel (EFFIENT) 10 MG TABS Take 1 tablet (10 mg total) by mouth daily.  . ranitidine (ZANTAC) 300 MG capsule Take 300 mg by mouth every evening.    . ranolazine (RANEXA) 1000 MG SR tablet Take 1 tablet (1,000 mg total) by mouth 2 (two) times daily.  . rosuvastatin (CRESTOR) 10 MG tablet Take 1 tablet (10 mg total) by mouth daily.  . [DISCONTINUED] triamcinolone (NASACORT AQ) 55 MCG/ACT nasal inhaler Place 2 sprays into the nose daily. Use spray in each nostril   Review of Systems  Constitutional: Negative.   HENT: Negative.   Eyes: Negative.   Respiratory: Negative.   Cardiovascular: Positive for chest pain.  Gastrointestinal: Negative.   Musculoskeletal: Negative.   Skin: Negative.   Neurological: Negative.   Psychiatric/Behavioral: Positive for sleep disturbance. Negative for behavioral problems and confusion.       Memory problems  All other systems reviewed and are negative.    BP 104/72  Pulse 51  Ht 5\' 6"  (1.676 m)  Wt 193 lb (87.544 kg)  BMI 31.17  kg/m2  Physical Exam  Nursing note and vitals reviewed. Constitutional: He is oriented to person, place, and time. He appears well-developed and well-nourished.  HENT:  Head: Normocephalic.  Nose: Nose normal.  Mouth/Throat: Oropharynx is clear and moist.  Eyes: Conjunctivae are normal. Pupils are equal, round, and reactive to light.  Neck: Normal range of motion. Neck supple. No JVD present.  Cardiovascular: Normal rate, regular rhythm, S1 normal, S2 normal, normal heart sounds and intact distal pulses.  Exam reveals no gallop and no friction rub.   No murmur heard. Pulmonary/Chest: Effort normal and breath sounds normal. No respiratory distress. He has no wheezes. He has no rales. He exhibits  no tenderness.  Abdominal: Soft. Bowel sounds are normal. He exhibits no distension. There is no tenderness.  Musculoskeletal: Normal range of motion. He exhibits no edema and no tenderness.  Lymphadenopathy:    He has no cervical adenopathy.  Neurological: He is alert and oriented to person, place, and time. Coordination normal.  Skin: Skin is warm and dry. No rash noted. No erythema.  Psychiatric: He has a normal mood and affect. His behavior is normal. Judgment and thought content normal.      Assessment and Plan      Assessment and Plan

## 2013-03-13 NOTE — Assessment & Plan Note (Signed)
Weight is up 6 pounds from his prior clinic visit. He is not exercising

## 2013-03-13 NOTE — Assessment & Plan Note (Signed)
Previous stroke which is responsible for underlying memory issues per his wife

## 2013-03-13 NOTE — Telephone Encounter (Signed)
Spoke w/ pt's wife. She reports that pt's angina symptoms, tightness in his chest and throat has been increasing recently. States that due to pt's recent strokes, she in unsure how often he is actually having them and would like to see Dr. Mariah Milling. Pt sched to see Dr. Mariah Milling today at 11:45.

## 2013-03-16 ENCOUNTER — Telehealth: Payer: Self-pay

## 2013-03-16 DIAGNOSIS — R079 Chest pain, unspecified: Secondary | ICD-10-CM

## 2013-03-16 NOTE — Telephone Encounter (Signed)
Don't think we will be able to accommodate Friday unless they have first case available and we can move first 3 patient's to other times slots  Otherwise would book early Thursday morning first second case Book December 22 or 23rd first case

## 2013-03-16 NOTE — Telephone Encounter (Signed)
Spoke w/ pt's wife.  She would like to set up a cardiac cath for her husband, preferably this Friday, 12/19, as it is would be convenient for the two of them to get off of work on that day. Informed her that I would check w/ Dr. Mariah Milling and call her back.

## 2013-03-17 NOTE — Telephone Encounter (Signed)
Spoke w/ pt's wife.  Cath sched for 03/24/13 @ 7:30. Verbalizes understanding that pt will need to come in this week for preprocedure labs and chest xray. She is to call back to schedule an appt to have labs drawn, pick up orders for cxr and go over instructions.

## 2013-03-18 ENCOUNTER — Ambulatory Visit: Payer: Self-pay | Admitting: Cardiovascular Disease

## 2013-03-18 LAB — CBC WITH DIFFERENTIAL/PLATELET
Basophil #: 0.1 10*3/uL (ref 0.0–0.1)
Basophil %: 1 %
Eosinophil #: 0.5 10*3/uL (ref 0.0–0.7)
Eosinophil %: 6.5 %
HGB: 13 g/dL (ref 13.0–18.0)
Lymphocyte #: 1.8 10*3/uL (ref 1.0–3.6)
Lymphocyte %: 25.9 %
MCH: 32.4 pg (ref 26.0–34.0)
MCHC: 34.5 g/dL (ref 32.0–36.0)
Monocyte #: 0.9 x10 3/mm (ref 0.2–1.0)
Monocyte %: 12.2 %
Platelet: 169 10*3/uL (ref 150–440)
RBC: 4.02 10*6/uL — ABNORMAL LOW (ref 4.40–5.90)
RDW: 13 % (ref 11.5–14.5)
WBC: 7 10*3/uL (ref 3.8–10.6)

## 2013-03-18 LAB — BASIC METABOLIC PANEL
Anion Gap: 4 — ABNORMAL LOW (ref 7–16)
BUN: 23 mg/dL — ABNORMAL HIGH (ref 7–18)
Calcium, Total: 9.2 mg/dL (ref 8.5–10.1)
Chloride: 107 mmol/L (ref 98–107)
Creatinine: 1.25 mg/dL (ref 0.60–1.30)
EGFR (African American): >60
EGFR (Non-African Amer.): >60
Glucose: 79 mg/dL (ref 65–99)
Potassium: 4 mmol/L (ref 3.5–5.1)
Sodium: 141 mmol/L (ref 136–145)

## 2013-03-18 LAB — PROTIME-INR
INR: 1.1
Prothrombin Time: 13.9 secs (ref 11.5–14.7)

## 2013-03-19 ENCOUNTER — Other Ambulatory Visit: Payer: Self-pay

## 2013-03-19 DIAGNOSIS — R079 Chest pain, unspecified: Secondary | ICD-10-CM

## 2013-03-19 NOTE — Progress Notes (Signed)
This encounter was created in error - please disregard.

## 2013-03-23 ENCOUNTER — Other Ambulatory Visit: Payer: Self-pay | Admitting: Cardiovascular Disease

## 2013-03-23 NOTE — Telephone Encounter (Signed)
Requested Prescriptions   Signed Prescriptions Disp Refills  . EFFIENT 10 MG TABS tablet 30 tablet 6    Sig: Take 1 tablet by mouth daily.    Authorizing Provider: Antonieta Iba    Ordering User: Kendrick Fries

## 2013-03-24 ENCOUNTER — Ambulatory Visit: Payer: Self-pay | Admitting: Cardiovascular Disease

## 2013-03-24 ENCOUNTER — Encounter: Payer: Self-pay | Admitting: Cardiovascular Disease

## 2013-03-24 ENCOUNTER — Other Ambulatory Visit: Payer: Self-pay

## 2013-03-24 DIAGNOSIS — I2510 Atherosclerotic heart disease of native coronary artery without angina pectoris: Secondary | ICD-10-CM

## 2013-03-24 DIAGNOSIS — I472 Ventricular tachycardia, unspecified: Secondary | ICD-10-CM

## 2013-03-24 DIAGNOSIS — R079 Chest pain, unspecified: Secondary | ICD-10-CM

## 2013-03-30 DIAGNOSIS — I472 Ventricular tachycardia: Secondary | ICD-10-CM

## 2013-03-30 DIAGNOSIS — R079 Chest pain, unspecified: Secondary | ICD-10-CM

## 2013-03-30 DIAGNOSIS — I4729 Other ventricular tachycardia: Secondary | ICD-10-CM

## 2013-04-03 ENCOUNTER — Encounter: Payer: 59 | Admitting: Cardiovascular Disease

## 2013-04-07 ENCOUNTER — Ambulatory Visit (INDEPENDENT_AMBULATORY_CARE_PROVIDER_SITE_OTHER): Payer: 59 | Admitting: Cardiovascular Disease

## 2013-04-07 ENCOUNTER — Encounter: Payer: Self-pay | Admitting: Cardiovascular Disease

## 2013-04-07 VITALS — BP 112/62 | HR 59 | Ht 66.0 in | Wt 192.2 lb

## 2013-04-07 DIAGNOSIS — I25810 Atherosclerosis of coronary artery bypass graft(s) without angina pectoris: Secondary | ICD-10-CM

## 2013-04-07 DIAGNOSIS — I4891 Unspecified atrial fibrillation: Secondary | ICD-10-CM

## 2013-04-07 DIAGNOSIS — I472 Ventricular tachycardia: Secondary | ICD-10-CM

## 2013-04-07 DIAGNOSIS — E663 Overweight: Secondary | ICD-10-CM

## 2013-04-07 DIAGNOSIS — E785 Hyperlipidemia, unspecified: Secondary | ICD-10-CM

## 2013-04-07 DIAGNOSIS — I4729 Other ventricular tachycardia: Secondary | ICD-10-CM

## 2013-04-07 MED ORDER — NITROGLYCERIN 0.4 MG SL SUBL: 0.4000 mg | SUBLINGUAL_TABLET | SUBLINGUAL | Status: DC | PRN

## 2013-04-07 MED ORDER — OMEPRAZOLE 20 MG PO CPDR: 20.0000 mg | DELAYED_RELEASE_CAPSULE | Freq: Every day | ORAL | Status: DC

## 2013-04-07 NOTE — Assessment & Plan Note (Signed)
We have encouraged continued exercise, careful diet management in an effort to lose weight. 

## 2013-04-07 NOTE — Progress Notes (Signed)
Patient ID: Kevin [name], male    DOB: 1955/06/23, 58 y.o.   MRN: [id number]  HPI Comments: Kevin [name] is a 58 y/o male with h/o CAD, atrial fib, HTN, hyperlipidemia, OSA and previous CVA,  CABG with Maze and PFO closure in February 2010,  cath in 10/2008  for exertional dyspnea and CP showing 2-V CAD. LAD 95% mid, LCX small RCA large OK.  SVG -> OM1 and OM2 was occluded. SVG-Diag was widely patent and backfilled the LAD well. LIMA - LAD was atretic. Myoview to assess LAD for ischemia with very mild reversible defect in very distal anterior wall and apex. Decision made to manage him medically, continued chest pain and cath Aug 9th 2011 with PCI of the LAD with 2 stents placed.   On his last clinic visit, he reported having worsening chest pain. Symptoms concerning for unstable angina. Cardiac catheterization last week at the end of December 2014 showing no significant change in his disease, patent stents in the LAD.  He did have nonsustained VT during the case. Impressive run of ventricular arrhythmia. He is asymptomatic at the time. This was recorded on telemetry and presented to show the family. On further discussion, it was uncertain if arrhythmia was causing his episodes of chest pain. 30 day monitor was ordered He does report having one episode of severe chest pain the day prior to wearing the monitor. Since then he has had additional episodes of mild chest pain. No arrhythmia noted so far on 30 day monitor. He is only one week into wearing a 30 day event monitor. He is back at work, otherwise feels well  Last stress test September 2013 Last cardiac cath December 2014 EKG shows normal sinus rhythm with rate of 59 beats per minute,  left axis deviation, old inferior MI   Outpatient Encounter Prescriptions as of 04/07/2013  Medication Sig  . acetaminophen (TYLENOL) 500 MG tablet Take 500 mg by mouth as needed.    . ALPRAZolam (XANAX) 0.5 MG tablet Take 0.5 mg by mouth at bedtime as needed.    Marland Kitchen  aspirin 325 MG tablet Take 325 mg by mouth daily.    . cetirizine (ZYRTEC) 10 MG tablet Take 10 mg by mouth daily.    Marland Kitchen EFFIENT 10 MG TABS tablet Take 1 tablet by mouth daily.  Marland Kitchen escitalopram (LEXAPRO) 20 MG tablet Take 20 mg by mouth daily.  . isosorbide mononitrate (IMDUR) 30 MG 24 hr tablet Take 1 tablet (30 mg total) by mouth daily.  . metoprolol (LOPRESSOR) 50 MG tablet Take 1 tablet (50 mg total) by mouth 2 (two) times daily.  . nitroGLYCERIN (NITROSTAT) 0.4 MG SL tablet Place 1 tablet (0.4 mg total) under the tongue every 5 (five) minutes as needed. May repeat for up to 3 doses.  . ranitidine (ZANTAC) 300 MG capsule Take 300 mg by mouth every evening.    . ranolazine (RANEXA) 1000 MG SR tablet Take 1 tablet (1,000 mg total) by mouth 2 (two) times daily.  . rosuvastatin (CRESTOR) 20 MG tablet Take 1 tablet (20 mg total) by mouth daily.   Review of Systems  Constitutional: Negative.   HENT: Negative.   Eyes: Negative.   Respiratory: Negative.   Cardiovascular: Positive for chest pain.  Gastrointestinal: Negative.   Endocrine: Negative.   Musculoskeletal: Negative.   Skin: Negative.   Allergic/Immunologic: Negative.   Neurological: Negative.   Hematological: Negative.   Psychiatric/Behavioral: Positive for sleep disturbance. Negative for behavioral problems and confusion.  Memory problems  All other systems reviewed and are negative.   BP 112/62  Pulse 59  Ht 5\' 6"  (1.676 m)  Wt 192 lb 4 oz (87.204 kg)  BMI 31.04 kg/m2  Physical Exam  Nursing note and vitals reviewed. Constitutional: He is oriented to person, place, and time. He appears well-developed and well-nourished.  HENT:  Head: Normocephalic.  Nose: Nose normal.  Mouth/Throat: Oropharynx is clear and moist.  Eyes: Conjunctivae are normal. Pupils are equal, round, and reactive to light.  Neck: Normal range of motion. Neck supple. No JVD present.  Cardiovascular: Normal rate, regular rhythm, S1 normal, S2  normal, normal heart sounds and intact distal pulses.  Exam reveals no gallop and no friction rub.   No murmur heard. Pulmonary/Chest: Effort normal and breath sounds normal. No respiratory distress. He has no wheezes. He has no rales. He exhibits no tenderness.  Abdominal: Soft. Bowel sounds are normal. He exhibits no distension. There is no tenderness.  Musculoskeletal: Normal range of motion. He exhibits no edema and no tenderness.  Lymphadenopathy:    He has no cervical adenopathy.  Neurological: He is alert and oriented to person, place, and time. Coordination normal.  Skin: Skin is warm and dry. No rash noted. No erythema.  Psychiatric: He has a normal mood and affect. His behavior is normal. Judgment and thought content normal.      Assessment and Plan

## 2013-04-07 NOTE — Assessment & Plan Note (Addendum)
Nonsustained VT noted during his recent cardiac catheterization December 2014. Currently wearing event monitor. Catheter was not engaged in coronary artery, we were exchanging catheters at the time of the arrhythmia. He was asymptomatic. Unable to exclude mild ischemia after dye had been given.

## 2013-04-07 NOTE — Assessment & Plan Note (Signed)
Recent cardiac catheterization showing no significant progression of his disease. We'll continue medical management. Nitroglycerin for chest pain. Suggested he try omeprazole daily for possible GI pathology

## 2013-04-07 NOTE — Assessment & Plan Note (Signed)
Lipids close to goal. Encouraged weight loss and exercise. Continue Crestor

## 2013-04-07 NOTE — Patient Instructions (Signed)
You are doing well. No medication changes were made.  Please call us if you have new issues that need to be addressed before your next appt.  Your physician wants you to follow-up in: 6 months.  You will receive a reminder letter in the mail two months in advance. If you don't receive a letter, please call our office to schedule the follow-up appointment.   

## 2013-04-08 ENCOUNTER — Encounter: Payer: Self-pay | Admitting: Cardiovascular Disease

## 2013-04-09 ENCOUNTER — Other Ambulatory Visit: Payer: Self-pay

## 2013-04-09 MED ORDER — OMEPRAZOLE 20 MG PO CPDR: 20.0000 mg | DELAYED_RELEASE_CAPSULE | Freq: Two times a day (BID) | ORAL | Status: DC

## 2013-04-09 NOTE — Telephone Encounter (Signed)
Refill sent in for omeprazole BID

## 2013-04-15 ENCOUNTER — Other Ambulatory Visit: Payer: Self-pay | Admitting: Cardiovascular Disease

## 2013-05-01 ENCOUNTER — Telehealth: Payer: Self-pay

## 2013-05-01 NOTE — Telephone Encounter (Signed)
Left message for pt to call back regarding event monitor.

## 2013-05-01 NOTE — Telephone Encounter (Signed)
Reviewed results w/ pt's wife, per Dr. Rockey Situ:  "NSR w/ no significant arrythmia".  She verbalizes understanding and will call w/ any other questions or concerns.

## 2013-05-05 ENCOUNTER — Other Ambulatory Visit: Payer: Self-pay

## 2013-05-05 ENCOUNTER — Ambulatory Visit (INDEPENDENT_AMBULATORY_CARE_PROVIDER_SITE_OTHER): Payer: 59

## 2013-05-05 DIAGNOSIS — I472 Ventricular tachycardia, unspecified: Secondary | ICD-10-CM

## 2013-05-05 DIAGNOSIS — R079 Chest pain, unspecified: Secondary | ICD-10-CM

## 2013-05-05 DIAGNOSIS — I4729 Other ventricular tachycardia: Secondary | ICD-10-CM

## 2013-07-07 ENCOUNTER — Other Ambulatory Visit: Payer: Self-pay | Admitting: Cardiovascular Disease

## 2013-08-11 ENCOUNTER — Other Ambulatory Visit: Payer: Self-pay | Admitting: Cardiovascular Disease

## 2013-08-25 ENCOUNTER — Other Ambulatory Visit: Payer: Self-pay | Admitting: Cardiovascular Disease

## 2013-09-24 ENCOUNTER — Encounter: Payer: Self-pay | Admitting: Neurology

## 2013-09-24 ENCOUNTER — Ambulatory Visit (INDEPENDENT_AMBULATORY_CARE_PROVIDER_SITE_OTHER): Payer: 59 | Admitting: Neurology

## 2013-09-24 VITALS — BP 102/70 | HR 54 | Resp 18 | Ht 66.0 in | Wt 188.0 lb

## 2013-09-24 DIAGNOSIS — I69319 Unspecified symptoms and signs involving cognitive functions following cerebral infarction: Secondary | ICD-10-CM | POA: Insufficient documentation

## 2013-09-24 DIAGNOSIS — I69919 Unspecified symptoms and signs involving cognitive functions following unspecified cerebrovascular disease: Secondary | ICD-10-CM

## 2013-09-24 NOTE — Patient Instructions (Signed)
Hypersomnia Hypersomnia usually brings recurrent episodes of excessive daytime sleepiness or prolonged nighttime sleep. It is different than feeling tired due to lack of or interrupted sleep at night. People with hypersomnia are compelled to nap repeatedly during the day. This is often at inappropriate times such as:  At work.  During a meal.  In conversation. These daytime naps usually provide no relief. This disorder typically affects adolescents and young adults. CAUSES  This condition may be caused by:  Another sleep disorder (such as narcolepsy or sleep apnea).  Dysfunction of the autonomic nervous system.  Drug or alcohol abuse.  A physical problem, such as:  A tumor.  Head trauma. This is damage caused by an accident.  Injury to the central nervous system.  Certain medications, or medicine withdrawal.  Medical conditions may contribute to the disorder, including:  Multiple sclerosis.  Depression.  Encephalitis.  Epilepsy.  Obesity.  Some people appear to have a genetic predisposition to this disorder. In others, there is no known cause. SYMPTOMS   Patients often have difficulty waking from a long sleep. They may feel dazed or confused.  Other symptoms may include:  Anxiety.  Increased irritation (inflammation).  Decreased energy.  Restlessness.  Slow thinking.  Slow speech.  Loss of appetite.  Hallucinations.  Memory difficulty.  Tremors, Tics.  Some patients lose the ability to function in family, social, occupational, or other settings. TREATMENT  Treatment is symptomatic in nature. Stimulants and other drugs may be used to treat this disorder. Changes in behavior may help. For example, avoid night work and social activities that delay bed time. Changes in diet may offer some relief. Patients should avoid alcohol and caffeine. PROGNOSIS  The likely outcome (prognosis) for persons with hypersomnia depends on the cause of the disorder.  The disorder itself is not life threatening. But it can have serious consequences. For example, automobile accidents can be caused by falling asleep while driving. The attacks usually continue indefinitely. Document Released: 03/09/2002 Document Revised: 06/11/2011 Document Reviewed: 02/11/2008 Exit[employer] Patient Information 2015 [employer]. This information is not intended to replace advice given to you by your health care provider. Make sure you discuss any questions you have with your health care provider.  

## 2013-09-24 NOTE — Progress Notes (Signed)
[facility]  Provider:  Dr Dohmeier Referring Provider: [name], MD Primary Care Physician:  Kevin Post, MD   Excessive daytime fatigue and sleepiness .  Kevin. Rockey Situ, Kevin [name]    HPI:  Kevin [name] is a 58 y.o. male here as a follow up visit, after last year's referral for transition of care from Kevin. Erling [name].   HST in 2013 negative for OSA, Epworth score is 18, FSS 43 points, hypersomnia persistent.   2007 two strokes , in March affecting the left and in May 2007 affecting right hemisphere.   He has typical right brain deficits based on his neuropsychological testing. 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 His spouse feels that his performance is limited and work routines are too Film/video editor for him. He needs to re check his own work, all this takes extra time, he cannot multitask.    Last visit note:  Patient was originally referred for hypersomnia by Kevin [name]  03-21-12, after  he reported being noncompliant with CPAP. 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 Again meanwhile his apnea index is less than 5 and therefore not longer in need of CPAP treatment. Would have been present isn't elevated or excessive daytime sleepiness with Epworth score is 17/24 at the time of his home sleep test as ordered by Kevin. Erling [name], and also  today he again and Kevin [name] an Epworth sleepiness score of 13 point less than in last December but still slightly elevated.  Goes  to bed at 10:30 PM,  falls asleep very quickly and  arises in the morning at 7:30. He may have one or none bathroom break. And intermittent slowing only the patient does not snore himself awake. The patient reports that if he has a chance to take a nap he will and it'll take between 2-1/2 hours. He seems to be at his sleep he is between 11 AM and 2 was 3 PM. The patient also falls regularly asleep in front of the TV at night at about 6:30 PM . He states that he cannot stay awake until the news.  I was able today to review all sleep study if the whole sleep test, the previous polysomnography from 11-24-10 and CPAP interpretation from 9-22,012. Kevin. Morene [name] is the referring physician for all 3 studies. The patient was no able to lose weight , was 170 pounds in 2012 now 188 lbs.   His past medical history is summarized here in short:  Patient suffered a left brain stroke in March 2007 at age 6, another stroke in the right posterior cerebral artery on 08-21-2005. 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 He underwent a coronary artery disease 4 vessel bypass surgery and patch closure by  Kevin. Nils [name]  on 2-15 2010. At the time he had complained of intermittent diplopia as a residual from his stroke symptoms,  he also had cognitive issues. A  neuropsychological battery testing in September 2010 showed that his cognitive abilities were well in the average range and his executive function was intact. But he had deficits for complex visual attention, shifting attention and multitasking multiple stimuli applied simultaneously did result in confusion. His memory findings were typical of of right brain dysfunction .   The patient is a Armed forces operational officer ( flooring)  and noted that he becomes more fatigued  and probably less attentive with the ongoing day at work.  Toward the end of the day he would often develop a tinnitus which he feels distracts him . He feels that: Naps in the morning or early afternoon can help him to sustain productivity through the day, he feels less fatigued less distractible and more able to concentrate.    Review of Systems: Out of a complete 14 system review, the patient complains of only the following symptoms, and all other reviewed systems are negative. EDS,  Epworth 18 points, increased . FSS high .  Tinnitus, leg burning,    History   Social History  . Marital Status: Married    Spouse Name: Kevin [name]    Number of Children: 3  . Years of Education: N/A   Occupational History  . Full time    Social History Main Topics  . Smoking status: Never Smoker   . Smokeless tobacco: Never Used  . Alcohol Use: No  . Drug Use: No  . Sexual Activity: Not on file   Other Topics Concern  . 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 Went to high school. He has worked at his present job for 22 years. He has been married for 34 years. They have 3 children. 2 of his daughters live in [place]. He drinks less than two cups of caffeine per day. He does not use  tobacco or recreational drugs. He has rare alcohol intake. Lives with wife and daughter      OSA diagnosed at [facility] , had his  sleep studies there  reviewed them all in detail today he  has retrognathia, sinusitis,  rhinitis      His ESS remains very high  at 16 and FSS at 5 . His falls assessment tool score is 5.   nasonex, refitted mask, the recent  HST failed to document that  apnea is present at all. education about REM BD and EDS, narcolepsy , cataplexy.  45 minutes.     Family History  Problem Relation Age of Onset  . Coronary artery disease Other   . Diabetes Other   .  Hypertension Other   . Hyperlipidemia Other   . Diabetes Sister   . Psychiatric Illness Father   . Cancer Paternal Uncle   . Heart disease Paternal Grandmother   . Heart disease Paternal Grandfather   . Diabetes Other     Past Medical History  Diagnosis Date  . CHF (congestive heart failure)   . Atrial fibrillation     s/p Maze  . Hyperlipidemia   . Sleep apnea   . Thrombocytopenia   . CAD (coronary artery disease)     s/p CABG  . Overweight(278.02)   . Patent foramen ovale     s/p  closure  . Stroke     right brain, 07/2005,left brain, 05/2005  . ASD (atrial septal defect)   . Hypertension   . Coronary artery disease     status [name] two-vessel to bypass surgery  . H/O maze procedure 05/17/08    closure of ASD  . Brachial plexopathy   . OSA (obstructive sleep apnea)   . Psoriatic arthritis   . 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     Past Surgical History  Procedure Laterality Date  . Patent foramen ovale closure    . Coronary artery bypass graft  05/12/2008    x4 by PeterVan Trigt,MD  . Cholecystectomy  08/19/2009  . Tonsillectomy      AS A CHILD  . Appendectomy  1984  . Acute pancreatitis  5/11  . Stints  8/11    X2    Current Outpatient Prescriptions  Medication Sig Dispense Refill  . acetaminophen (TYLENOL) 500 MG tablet Take 500 mg by mouth as needed.        . ALPRAZolam (XANAX) 0.5 MG tablet Take 0.5 mg by mouth at bedtime as needed.        Marland Kitchen aspirin 325 MG tablet Take 325 mg by mouth daily.        . cetirizine (ZYRTEC) 10 MG tablet Take 10 mg by mouth daily.        . CRESTOR 20 MG tablet TAKE 1 TABLET (20 MG TOTAL) BY MOUTH DAILY.  30 tablet  3  . EFFIENT 10 MG TABS tablet Take 1 tablet by mouth daily.  30 tablet  6  . escitalopram (LEXAPRO) 20 MG tablet Take 20 mg by mouth daily.      . isosorbide mononitrate (IMDUR) 30 MG 24 hr tablet TAKE ONE TABLET BY MOUTH DAILY  90 tablet  3  . metoprolol (LOPRESSOR) 50 MG  tablet TAKE ONE TABLET BY MOUTH 2 TIMES A DAY  60 tablet  3  . nitroGLYCERIN (NITROSTAT) 0.4 MG SL tablet Place 1 tablet (0.4 mg total) under the tongue every 5 (five) minutes as needed. May repeat for up to 3 doses.  25 tablet  6  . ranitidine (ZANTAC) 300 MG capsule Take 300 mg by mouth every evening.        . ranolazine (RANEXA) 1000 MG SR tablet Take 1 tablet (1,000 mg total) by mouth 2 (two) times daily.  180 tablet  6   No current facility-administered medications for this visit.    Allergies as of 09/24/2013 - Review Complete 09/24/2013  Allergen Reaction Noted  . Dilaudid [hydromorphone hcl]  06/21/2010  . Strawberry  12/04/2011    Vitals: BP 102/70  Pulse 54  Resp 18  Ht 5\' 6"  (1.676 m)  Wt 188 lb (85.276 kg)  BMI 30.36 kg/m2 Last Weight:  Wt Readings from Last 1 Encounters:  09/24/13 188 lb (85.276 kg)   Last Height:   Ht Readings from Last 1 Encounters:  09/24/13 5\' 6"  (1.676 m)     Physical exam:  General: The patient is awake, alert and appears not in acute distress. The patient is well groomed. Head: Normocephalic, atraumatic. Neck is supple. Mallampati 3 , neck circumference:  14.5  inches ,  Retrognathia  Cardiovascular:  Regular rate and rhythm, without  murmurs or carotid bruit, and without distended neck veins. Respiratory: Lungs are clear to auscultation. Skin:  Without evidence of edema, or rash Trunk: BMI is still  elevated , patient  has normal posture.  Neurologic exam : The patient is awake and alert, oriented to place and time.  He is slowed cognitively and he is distractible.   Neurophysiological testing.  Cranial nerves: Pupils are equal and briskly reactive to light. Funduscopic exam withoutevidence of pallor or edema. Extraocular movements  in vertical and horizontal planes intact and without nystagmus. Visual fields by finger perimetry documnted a upper left quadrant loss, corresponding to his STROKE .  Hearing to finger rub intact.  Facial  sensation intact to fine touch. Facial motor strength is symmetric and tongue and uvula move midline.  Motor exam:  Normal tone and normal muscle bulk , he has slightly decreased left sided strength in upper and lower extremities when tested in late PM. . 5/5 here, no drift.   Sensory:  Fine touch, pinprick and vibration were tested in all extremities. Proprioception is normal. Left hand feels gloved / clumsy .   Coordination: Rapid alternating movements in the fingers/hands is tested and normal. Finger-to-nose maneuver without evidence of ataxia, dysmetria or tremor.  Gait and station: Patient walks without assistive device, he is reporting a limp on the left when fatigued.  The patient denies any recent palpitations he does not have lower extremity edema and actually begins a fairly active lifestyle. He feels that his excessive daytime sleepiness has improved and by Epworth sleepiness scale his house. 18 points.  We had discussed in our last visit that I would recommend a dental device as alternative  to CPAP,  should snoring be detrimental to his sleep quality.  Snoring has not been reported by either the patient nor his wife and his mild degree of apnea may not need other treatment and avoiding supine sleep position.  The patient struggles with fatigue, but I do attribute to the right brain dysfunction since his  Stroke 2007.  His cognitive issues were in detail outlined in Kevin. Tressia Danas previous progress notes. He is fatigable and he is struggling with multitasking. He learnt to compensate with naps and breaks.   With his CAD history, I  would not place him on NUVIGIL or relatives, not on Adderall or ritalin.   The tennisball method was again discussed. Defer to cardiology for evaluation of stamina, and PCP for  exercise tolerance.

## 2013-10-09 NOTE — Telephone Encounter (Signed)
Close encounter 

## 2013-10-26 ENCOUNTER — Other Ambulatory Visit: Payer: Self-pay | Admitting: Cardiovascular Disease

## 2013-11-27 ENCOUNTER — Other Ambulatory Visit: Payer: Self-pay

## 2013-11-27 MED ORDER — CRESTOR 20 MG PO TABS: ORAL_TABLET | ORAL | Status: DC

## 2013-11-27 NOTE — Telephone Encounter (Signed)
Refill sent for crestor 20 mg

## 2013-12-17 ENCOUNTER — Other Ambulatory Visit: Payer: Self-pay | Admitting: Cardiovascular Disease

## 2013-12-18 ENCOUNTER — Ambulatory Visit: Payer: Self-pay | Admitting: Family Medicine

## 2013-12-18 LAB — CREATININE, SERUM
Creatinine: 1.5 mg/dL — ABNORMAL HIGH (ref 0.60–1.30)
EGFR (African American): 58 — ABNORMAL LOW
GFR CALC NON AF AMER: 50 — AB

## 2014-01-15 ENCOUNTER — Other Ambulatory Visit: Payer: Self-pay

## 2014-01-18 ENCOUNTER — Other Ambulatory Visit: Payer: Self-pay | Admitting: Cardiovascular Disease

## 2014-01-25 ENCOUNTER — Other Ambulatory Visit: Payer: Self-pay | Admitting: Cardiovascular Disease

## 2014-04-01 ENCOUNTER — Other Ambulatory Visit: Payer: Self-pay | Admitting: Cardiovascular Disease

## 2014-04-05 ENCOUNTER — Other Ambulatory Visit: Payer: Self-pay | Admitting: Cardiovascular Disease

## 2014-04-06 ENCOUNTER — Other Ambulatory Visit: Payer: Self-pay

## 2014-04-06 MED ORDER — EFFIENT 10 MG PO TABS: 10.0000 mg | ORAL_TABLET | Freq: Every day | ORAL | Status: DC

## 2014-04-06 MED ORDER — METOPROLOL TARTRATE 50 MG PO TABS: ORAL_TABLET | ORAL | Status: DC

## 2014-04-06 MED ORDER — RANOLAZINE ER 1000 MG PO TB12: ORAL_TABLET | ORAL | Status: DC

## 2014-04-06 NOTE — Telephone Encounter (Signed)
Pt has made an appt for 04/29/2014

## 2014-04-29 ENCOUNTER — Ambulatory Visit: Payer: 59 | Admitting: Cardiovascular Disease

## 2014-05-14 ENCOUNTER — Encounter: Payer: Self-pay | Admitting: Cardiovascular Disease

## 2014-05-14 ENCOUNTER — Ambulatory Visit (INDEPENDENT_AMBULATORY_CARE_PROVIDER_SITE_OTHER): Payer: 59 | Admitting: Cardiovascular Disease

## 2014-05-14 VITALS — BP 110/62 | HR 71 | Ht 66.0 in | Wt 195.5 lb

## 2014-05-14 DIAGNOSIS — R079 Chest pain, unspecified: Secondary | ICD-10-CM

## 2014-05-14 DIAGNOSIS — I208 Other forms of angina pectoris: Secondary | ICD-10-CM

## 2014-05-14 DIAGNOSIS — E785 Hyperlipidemia, unspecified: Secondary | ICD-10-CM

## 2014-05-14 DIAGNOSIS — I25810 Atherosclerosis of coronary artery bypass graft(s) without angina pectoris: Secondary | ICD-10-CM

## 2014-05-14 DIAGNOSIS — E669 Obesity, unspecified: Secondary | ICD-10-CM

## 2014-05-14 MED ORDER — NITROGLYCERIN 0.4 MG SL SUBL: 0.4000 mg | SUBLINGUAL_TABLET | SUBLINGUAL | Status: DC | PRN

## 2014-05-14 NOTE — Assessment & Plan Note (Signed)
Encouraged a regular walking program.

## 2014-05-14 NOTE — Progress Notes (Signed)
Patient ID: Kevin [name], male    DOB: 12-Dec-1955, 59 y.o.   MRN: [id number]  HPI Comments: Kevin [name] is a 59 y/o male with h/o CAD, atrial fib, HTN, hyperlipidemia, OSA and previous CVA,  CABG with Maze and PFO closure in February 2010,  cath in 10/2008  for exertional dyspnea and CP showing 2-V CAD. LAD 95% mid, LCX small RCA large OK.  SVG -> OM1 and OM2 was occluded. SVG-Diag was widely patent and backfilled the LAD well. LIMA - LAD was atretic. Myoview to assess LAD for ischemia with very mild reversible defect in very distal anterior wall and apex. Decision made to manage him medically, continued chest pain and cath Aug 9th 2011 with PCI of the LAD with 2 stents placed.  He presents today for follow-up of his coronary artery disease Last seen January 2015.  Over the past year, he reports that he's been doing well. No regular exercise, weight is up several pounds. He continues to have periodic chest pain. No dramatic change. He is not taking nitroglycerin. Overall he feels his chest pain symptoms might be better, not worse. He continues to have stress at work. Continues to have problems remembering things following his stroke  EKG on today's visit shows normal sinus rhythm with rate 71 bpm, no significant ST or T-wave changes  Other past medical history Cardiac catheterization last week at the end of December 2014 showing no significant change in his disease, patent stents in the LAD.  He did have nonsustained VT during the case. Impressive run of ventricular arrhythmia. He is asymptomatic at the time. This was recorded on telemetry and presented to show the family. On further discussion, it was uncertain if arrhythmia was causing his episodes of chest pain. 30 day monitor was ordered Last stress test September 2013 Last cardiac cath December 2014    Allergies  Allergen Reactions  . Dilaudid [Hydromorphone Hcl]   . Strawberry     vomiting    Outpatient Encounter Prescriptions as of  05/14/2014  Medication Sig  . acetaminophen (TYLENOL) 500 MG tablet Take 500 mg by mouth as needed.    . ALPRAZolam (XANAX) 0.5 MG tablet Take 0.5 mg by mouth at bedtime as needed.    Marland Kitchen aspirin 325 MG tablet Take 325 mg by mouth daily.    . cetirizine (ZYRTEC) 10 MG tablet Take 10 mg by mouth daily.    . CRESTOR 20 MG tablet TAKE 1 TABLET (20 MG TOTAL) BY MOUTH DAILY.  Marland Kitchen EFFIENT 10 MG TABS tablet Take 1 tablet (10 mg total) by mouth daily.  Marland Kitchen escitalopram (LEXAPRO) 20 MG tablet Take 20 mg by mouth daily.  . isosorbide mononitrate (IMDUR) 30 MG 24 hr tablet TAKE ONE TABLET BY MOUTH DAILY  . metoprolol (LOPRESSOR) 50 MG tablet TAKE ONE TABLET BY MOUTH 2 TIMES A DAY  . nitroGLYCERIN (NITROSTAT) 0.4 MG SL tablet Place 1 tablet (0.4 mg total) under the tongue every 5 (five) minutes as needed. May repeat for up to 3 doses.  . ranitidine (ZANTAC) 300 MG capsule Take 300 mg by mouth every evening.    . ranolazine (RANEXA) 1000 MG SR tablet Take 1 tablet by mouth 2 times daily.  . [DISCONTINUED] metoprolol (LOPRESSOR) 50 MG tablet TAKE ONE TABLET BY MOUTH 2 TIMES A DAY (Patient not taking: Reported on 05/14/2014)    Past Medical History  Diagnosis Date  . CHF (congestive heart failure)   . Atrial fibrillation  s/p Maze  . Hyperlipidemia   . Sleep apnea   . Thrombocytopenia   . CAD (coronary artery disease)     s/p CABG  . Overweight(278.02)   . Patent foramen ovale     s/p closure  . Stroke     right brain, 07/2005,left brain, 05/2005  . ASD (atrial septal defect)   . Hypertension   . Coronary artery disease     status post two-vessel to bypass surgery  . H/O maze procedure 05/17/08    closure of ASD  . Brachial plexopathy   . OSA (obstructive sleep apnea)   . Psoriatic arthritis   . 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     Past Surgical History  Procedure Laterality Date  . Patent foramen ovale closure    . Coronary artery bypass graft   05/12/2008    x4 by PeterVan Trigt,MD  . Cholecystectomy  08/19/2009  . Tonsillectomy      AS A CHILD  . Appendectomy  1984  . Acute pancreatitis  5/11  . Stints  8/11    X2    Social History  reports that he has never smoked. He has never used smokeless tobacco. He reports that he does not drink alcohol or use illicit drugs.  Family History family history includes Cancer in his paternal uncle; Coronary artery disease in his other; Diabetes in his other, other, and sister; Heart disease in his paternal grandfather and paternal grandmother; Hyperlipidemia in his other; Hypertension in his other; Psychiatric Illness in his father.  Review of Systems  Constitutional: Negative.   HENT: Negative.   Eyes: Negative.   Respiratory: Negative.   Cardiovascular: Positive for chest pain.  Gastrointestinal: Negative.   Endocrine: Negative.   Musculoskeletal: Negative.   Skin: Negative.   Allergic/Immunologic: Negative.   Neurological: Negative.   Hematological: Negative.   Psychiatric/Behavioral: Positive for sleep disturbance. Negative for behavioral problems and confusion.       Memory problems  All other systems reviewed and are negative.  BP 110/62 mmHg  Pulse 71  Ht 5\' 6"  (1.676 m)  Wt 195 lb 8 oz (88.678 kg)  BMI 31.57 kg/m2  Physical Exam  Constitutional: He is oriented to person, place, and time. He appears well-developed and well-nourished.  HENT:  Head: Normocephalic.  Nose: Nose normal.  Mouth/Throat: Oropharynx is clear and moist.  Eyes: Conjunctivae are normal. Pupils are equal, round, and reactive to light.  Neck: Normal range of motion. Neck supple. No JVD present.  Cardiovascular: Normal rate, regular rhythm, S1 normal, S2 normal, normal heart sounds and intact distal pulses.  Exam reveals no gallop and no friction rub.   No murmur heard. Pulmonary/Chest: Effort normal and breath sounds normal. No respiratory distress. He has no wheezes. He has no rales. He  exhibits no tenderness.  Abdominal: Soft. Bowel sounds are normal. He exhibits no distension. There is no tenderness.  Musculoskeletal: Normal range of motion. He exhibits no edema or tenderness.  Lymphadenopathy:    He has no cervical adenopathy.  Neurological: He is alert and oriented to person, place, and time. Coordination normal.  Skin: Skin is warm and dry. No rash noted. No erythema.  Psychiatric: He has a normal mood and affect. His behavior is normal. Judgment and thought content normal.      Assessment and Plan   Nursing note and vitals reviewed.

## 2014-05-14 NOTE — Assessment & Plan Note (Signed)
Long discussion today concerning his stable angina symptoms. He feels that is about the same or better. No indication for cardiac catheterization at this time. Unable to advance his medications given blood pressure and heart rate borderline low

## 2014-05-14 NOTE — Assessment & Plan Note (Signed)
Currently with no symptoms of angina. No further workup at this time. Continue current medication regimen. 

## 2014-05-14 NOTE — Assessment & Plan Note (Signed)
Lipid panel has been ordered, paperwork provided

## 2014-05-14 NOTE — Patient Instructions (Signed)
You are doing well. No medication changes were made.  We will order labs (to go): lipids and LFT  Please call us if you have new issues that need to be addressed before your next appt.  Your physician wants you to follow-up in: 6 months.  You will receive a reminder letter in the mail two months in advance. If you don't receive a letter, please call our office to schedule the follow-up appointment.

## 2014-06-14 ENCOUNTER — Other Ambulatory Visit: Payer: Self-pay | Admitting: Cardiovascular Disease

## 2014-07-02 ENCOUNTER — Other Ambulatory Visit: Payer: Self-pay | Admitting: Cardiovascular Disease

## 2014-07-03 LAB — LIPID PANEL WITH LDL/HDL RATIO
Cholesterol, Total: 166 mg/dL (ref 100–199)
HDL: 52 mg/dL (ref >39)
LDL Calculated: 92 mg/dL (ref 0–99)
LDL/HDL RATIO: 1.8 ratio (ref 0.0–3.6)
Triglycerides: 108 mg/dL (ref 0–149)
VLDL Cholesterol Cal: 22 mg/dL (ref 5–40)

## 2014-07-03 LAB — HEPATIC FUNCTION PANEL
ALT: 14 IU/L (ref 0–44)
AST: 21 IU/L (ref 0–40)
Albumin: 4.6 g/dL (ref 3.5–5.5)
Alkaline Phosphatase: 42 IU/L (ref 39–117)
Bilirubin Total: 0.7 mg/dL (ref 0.0–1.2)
Bilirubin, Direct: 0.19 mg/dL (ref 0.00–0.40)
Total Protein: 7 g/dL (ref 6.0–8.5)

## 2014-07-09 ENCOUNTER — Other Ambulatory Visit: Payer: Self-pay

## 2014-07-09 MED ORDER — ROSUVASTATIN CALCIUM 40 MG PO TABS: 40.0000 mg | ORAL_TABLET | Freq: Every day | ORAL | Status: DC

## 2014-08-23 ENCOUNTER — Other Ambulatory Visit: Payer: Self-pay | Admitting: Cardiovascular Disease

## 2014-09-02 ENCOUNTER — Telehealth: Payer: Self-pay | Admitting: *Deleted

## 2014-09-02 DIAGNOSIS — R079 Chest pain, unspecified: Secondary | ICD-10-CM

## 2014-09-02 NOTE — Telephone Encounter (Signed)
Pt wife calling needing to make 6 m fu apt, during the call she stated he is having chest pains.  Has angina but lately the pains are longer than usually.  Last one happen was last Thursday that she knows of Pt has had two heart attacks and can't remember much Made the soonest apt July 14 th  Please call wife.

## 2014-09-02 NOTE — Telephone Encounter (Signed)
Spoke w/ pt's wife.  She reports that pt had an episode of sustained chest pain for about 15 mins, pt did not take nitro.  Pt has h/o migraines and is afraid to take anything that would potentially cause a HA. Due to pt's recent stroke, he does not remember if he has sx throughout the day.  Discussed purpose of nitro w/ pt's wife, but she works in cardiac rehab here at [facility] and is aware.  She is requesting an appt for pt to be evaluated; advised her that Dr. Rockey Situ will be out of the office next week. She would like to know if he has any suggestions, but verbalizes understanding to call 911 if sx become emergent.  She states that she is ok w/ pt seeing PA if Dr. Rockey Situ feels this is necessary.

## 2014-09-03 ENCOUNTER — Other Ambulatory Visit: Payer: Self-pay

## 2014-09-03 NOTE — Telephone Encounter (Signed)
Spoke w/ pt's wife.  Advised her of Dr. Donivan Scull recommendation.  She is agreeable and requests lexi for 6/16 @ 7:30. Reviewed instructions w/ and advised that I will mail to pt, as well.

## 2014-09-03 NOTE — Telephone Encounter (Signed)
We could do a stress test (lexiscan myoview) This would let us know it stents are still open and if there is a region of ischemia, and where it  is located.

## 2014-09-16 ENCOUNTER — Encounter
Admission: RE | Admit: 2014-09-16 | Discharge: 2014-09-16 | Disposition: A | Discharge status: Home / Self Care | Payer: 59 | Source: Ambulatory Visit | Attending: Cardiovascular Disease | Admitting: Cardiovascular Disease

## 2014-09-16 DIAGNOSIS — R079 Chest pain, unspecified: Secondary | ICD-10-CM | POA: Insufficient documentation

## 2014-09-16 LAB — NM MYOCAR MULTI W/SPECT W/WALL MOTION / EF
CHL CUP NUCLEAR SRS: 2
CHL CUP NUCLEAR SSS: 1
LV dias vol: 77 mL
LVSYSVOL: 23 mL
NUC STRESS TID: 0.9
Peak HR: 83 {beats}/min
Percent HR: 51 %
Rest HR: 61 {beats}/min
SDS: 0

## 2014-09-16 MED ORDER — REGADENOSON 0.4 MG/5ML IV SOLN
0.4000 mg | Freq: Once | INTRAVENOUS | Status: AC
  Administered 2014-09-16: 0.4 mg via INTRAVENOUS
  Filled 2014-09-16: qty 5

## 2014-09-16 MED ORDER — TECHNETIUM TC 99M SESTAMIBI - CARDIOLITE
13.0000 | Freq: Once | INTRAVENOUS | Status: AC | PRN
  Administered 2014-09-16: 14.053 via INTRAVENOUS

## 2014-09-16 MED ORDER — TECHNETIUM TC 99M SESTAMIBI - CARDIOLITE
33.0000 | Freq: Once | INTRAVENOUS | Status: AC | PRN
  Administered 2014-09-16: 09:00:00 32.477 via INTRAVENOUS

## 2014-09-28 ENCOUNTER — Ambulatory Visit (INDEPENDENT_AMBULATORY_CARE_PROVIDER_SITE_OTHER): Payer: 59 | Admitting: Adult Health

## 2014-09-28 ENCOUNTER — Encounter: Payer: Self-pay | Admitting: Adult Health

## 2014-09-28 ENCOUNTER — Telehealth: Payer: Self-pay | Admitting: *Deleted

## 2014-09-28 VITALS — BP 98/63 | HR 59 | Ht 66.0 in | Wt 189.0 lb

## 2014-09-28 DIAGNOSIS — R4189 Other symptoms and signs involving cognitive functions and awareness: Secondary | ICD-10-CM

## 2014-09-28 DIAGNOSIS — Z8673 Personal history of transient ischemic attack (TIA), and cerebral infarction without residual deficits: Secondary | ICD-10-CM | POA: Diagnosis not present

## 2014-09-28 NOTE — Progress Notes (Addendum)
PATIENT: Kevin [name] DOB: 1955-10-11  REASON FOR VISIT: follow up- stroke HISTORY FROM: patient  HISTORY OF PRESENT ILLNESS: Kevin [name]  Is a 59 year old male with a history of strokes and residual cognitive slowing. He returns today for follow-up. The patient feels that his cognition has remained the same. He does not feel that things have gotten worse. 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 He is followed by a cardiologist who manages his blood pressure and cholesterol. He denies any additional strokelike symptoms. He also states that his fatigue has improved over the years. He states that he has learned how to manage it better. The patient continues to take aspirin as secondary stroke prevention.  Patient states that his short-term memory is most affected. However he states that he can normally recall things if prompted. He denies any new medical issues. He returns today for an evaluation.  HISTORY 09/24/13: Kevin [name] is a 59 y.o. male here as a follow up visit, after last year's referral for transition of care from Dr. Erling Cruz. HST in 2013 negative for OSA, Epworth score is 18, FSS 43 points, hypersomnia persistent. 2007 two strokes , in March affecting the left and in May 2007 affecting right hemisphere. He has typical right brain deficits based on his neuropsychological testing. 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 His spouse feels that his performance is limited and work routines are too Film/video editor for him. He needs to re check his own work, all this takes extra time, he cannot multitask.  Last visit note: Patient was originally referred for hypersomnia by Dr Erling Cruz 03-21-12, after he reported being noncompliant with CPAP. At the time I ordered a home sleep test which returned as an AHI of less than 5 and basically would not qualify  the patient for CPAP use or doesn't indicate the need of CPAP. Kevin [name] has a past medical history however him admitted and made understandable why apnea was considered for him to be a diagnosis he had a right brain stroke in May 2007, but it of coronary artery disease and atrial fibrillation in the past, hypertension, hyperlipidemia, psoriasis, obstructive sleep apnea was diagnosed in 2012 and at the time he was titrated to 7 cm water pressure at this was done at [facility]. Again meanwhile his apnea index is less than 5 and therefore not longer in need of CPAP treatment. Would have been present isn't elevated or excessive daytime sleepiness with Epworth score is 17/24 at the time of his home sleep test as ordered by Dr. Erling Cruz, and also today he again and [name] an Epworth sleepiness score of 13 point less than in last December but still slightly elevated.Goes to bed at 10:30 PM, falls asleep very quickly and arises in the morning at 7:30. He may have one or none bathroom break. And intermittent slowing only the patient does not snore himself awake.The patient reports that if he has a chance to take a nap he will and it'll take between 2-1/2 hours. He seems to be at his sleep he is between 11 AM and 2 was 3 PM. The patient also falls regularly asleep in front of the TV at night at about 6:30 PM . He states that he cannot stay awake until the news.I was able today  to review all sleep study if the whole sleep test, the previous polysomnography from 11-24-10 and CPAP interpretation from 9-22,012. Dr. Morene Antu is the referring physician for all 3 studies. The patient was no able to lose weight , was 170 pounds in 2012 now 188 lbs.  His past medical history is summarized here in short:  Patient suffered a left brain stroke in March 2007 at age 38, another stroke in the right posterior cerebral artery on 08-21-2005. He was diagnosed with a patent forearm and all father arterial septal defect  and atrial fibrillation and treated his Coumadin. He underwent a coronary artery disease 4 vessel bypass surgery and patch closure by Dr. Nils Pyle on 2-15 2010. At the time he had complained of intermittent diplopia as a residual from his stroke symptoms, he also had cognitive issues. A neuropsychological battery testing in September 2010 showed that his cognitive abilities were well in the average range and his executive function was intact. But he had deficits for complex visual attention, shifting attention and multitasking multiple stimuli applied simultaneously did result in confusion. His memory findings were typical of of right brain dysfunction . The patient is a Armed forces operational officer ( flooring) and noted that he becomes more fatigued and probably less attentive with the ongoing day at work.  Toward the end of the day he would often develop a tinnitus which he feels distracts him . He feels that: Naps in the morning or early afternoon can help him to sustain productivity through the day, he feels less fatigued less distractible and more able to concentrate.  REVIEW OF SYSTEMS: Out of a complete 14 system review of symptoms, the patient complains only of the following symptoms, and all other reviewed systems are negative.   Ringing in ears, chest tightness, joint pain, bruise/bleed easily, depression, nervous/anxious  ALLERGIES: Allergies  Allergen Reactions  . Strawberry Anaphylaxis       . Dilaudid [Hydromorphone Hcl]     Vomiting     HOME MEDICATIONS: Outpatient Prescriptions Prior to Visit  Medication Sig Dispense Refill  . acetaminophen (TYLENOL) 500 MG tablet Take 500 mg by mouth as needed.      . ALPRAZolam (XANAX) 0.5 MG tablet Take 0.5 mg by mouth at bedtime as needed.      Marland Kitchen aspirin 325 MG tablet Take 325 mg by mouth daily.      . cetirizine (ZYRTEC) 10 MG tablet Take 10 mg by mouth daily.      Marland Kitchen EFFIENT 10 MG TABS tablet Take 1 tablet (10 mg total) by mouth daily. 90  tablet 3  . escitalopram (LEXAPRO) 20 MG tablet Take 20 mg by mouth daily.    . isosorbide mononitrate (IMDUR) 30 MG 24 hr tablet TAKE ONE TABLET BY MOUTH DAILY 90 tablet 3  . metoprolol (LOPRESSOR) 50 MG tablet TAKE ONE TABLET BY MOUTH 2 TIMES A DAY 60 tablet 3  . nitroGLYCERIN (NITROSTAT) 0.4 MG SL tablet Place 1 tablet (0.4 mg total) under the tongue every 5 (five) minutes as needed. May repeat for up to 3 doses. 25 tablet 6  . ranitidine (ZANTAC) 300 MG capsule Take 300 mg by mouth every evening.      . ranolazine (RANEXA) 1000 MG SR tablet Take 1 tablet by mouth 2 times daily. 180 tablet 1  . rosuvastatin (CRESTOR) 40 MG tablet Take 1 tablet (40 mg total) by mouth daily. 90 tablet 3   No facility-administered medications prior to visit.    PAST MEDICAL  HISTORY: Past Medical History  Diagnosis Date  . CHF (congestive heart failure)   . Atrial fibrillation     s/p Maze  . Hyperlipidemia   . Sleep apnea   . Thrombocytopenia   . CAD (coronary artery disease)     s/p CABG  . Overweight(278.02)   . Patent foramen ovale     s/p closure  . Stroke     right brain, 07/2005,left brain, 05/2005  . ASD (atrial septal defect)   . Hypertension   . Coronary artery disease     status post two-vessel to bypass surgery  . H/O maze procedure 05/17/08    closure of ASD  . Brachial plexopathy   . OSA (obstructive sleep apnea)   . Psoriatic arthritis   . 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     PAST SURGICAL HISTORY: Past Surgical History  Procedure Laterality Date  . Patent foramen ovale closure    . Coronary artery bypass graft  05/12/2008    x4 by PeterVan Trigt,MD  . Cholecystectomy  08/19/2009  . Tonsillectomy      AS A CHILD  . Appendectomy  1984  . Acute pancreatitis  5/11  . Stints  8/11    X2    FAMILY HISTORY: Family History  Problem Relation Age of Onset  . Coronary artery disease Other   . Diabetes Other   . Hypertension Other     . Hyperlipidemia Other   . Diabetes Sister   . Psychiatric Illness Father   . Cancer Paternal Uncle   . Heart disease Paternal Grandmother   . Heart disease Paternal Grandfather   . Diabetes Other      PHYSICAL EXAM  Filed Vitals:   09/28/14 1322  BP: 98/63  Pulse: 59  Height: 5\' 6"  (1.676 m)  Weight: 189 lb (85.73 kg)   Body mass index is 30.52 kg/(m^2).  Generalized: Well developed, in no acute distress   Neurological examination  Mentation: Alert oriented to time, place, history taking. Follows all commands speech and language fluent Cranial nerve II-XII: Pupils were equal round reactive to light. Extraocular movements were full, visual field were full on confrontational test. Facial sensation and strength were normal. Uvula tongue midline. Head turning and shoulder shrug  were normal and symmetric. Motor: The motor testing reveals 5 over 5 strength of all 4 extremities. Good symmetric motor tone is noted throughout.  Sensory: Sensory testing is intact to soft touch on all 4 extremities. No evidence of extinction is noted.  Coordination: Cerebellar testing reveals good finger-nose-finger and heel-to-shin bilaterally.  Gait and station: Gait is normal. Tandem gait is normal. Romberg is negative. No drift is seen.  Reflexes: Deep tendon reflexes are symmetric and normal bilaterally.     DIAGNOSTIC DATA (LABS, IMAGING, TESTING) - I reviewed patient records, labs, notes, testing and imaging myself where available.    ASSESSMENT AND PLAN 59 y.o. year old male  has a past medical history of CHF (congestive heart failure); Atrial fibrillation; Hyperlipidemia; Sleep apnea; Thrombocytopenia; CAD (coronary artery disease); Overweight(278.02); Patent foramen ovale; Stroke; ASD (atrial septal defect); Hypertension; Coronary artery disease; H/O maze procedure (05/17/08); Brachial plexopathy; OSA (obstructive sleep apnea); Psoriatic arthritis; and Hypersomnia, organic (09/24/2012). here  with:   1. History of stroke  2. Cognition changes residual effect of stroke   Overall the patient has remained stable. His MMSE today is 30/30. The patient does not feel that his  cognition has gotten any worse. He denies any additional stroke like symptoms. I have went over stroke prevention with the patient. I also review signs and symptoms of a stroke. Patient advised that if his symptoms worsen or he develops new symptoms he should let us know. Otherwise he will follow-up in one year or sooner if needed.  Ward Givens, MSN, NP-C 09/28/2014, 1:22 PM [facility] [address], [place], [place] [address] [phone]  Note: This document was prepared with digital dictation and possible smart phrase technology. Any transcriptional errors that result from this process are unintentional.  I reviewed the above note and documentation by the Nurse Practitioner and agree with the history, physical exam, assessment and plan as outlined above. I was immediately available for face-to-face consultation. Star Age, MD, PhD [facility])

## 2014-09-28 NOTE — Patient Instructions (Signed)
Overall you are doing well. We will continue to monitor your memory.

## 2014-09-28 NOTE — Telephone Encounter (Signed)
Pt is asking does he need his upcoming apt with Dr Rockey Situ.  Please advise

## 2014-09-28 NOTE — Telephone Encounter (Signed)
Left message on pt's vm that he is due for a 6 mo f/u in August, but he can sched his appts at his convenience.  Asked him to call back and let us know his decision.

## 2014-10-13 ENCOUNTER — Encounter: Payer: Self-pay | Admitting: Cardiovascular Disease

## 2014-10-13 ENCOUNTER — Ambulatory Visit (INDEPENDENT_AMBULATORY_CARE_PROVIDER_SITE_OTHER): Payer: 59 | Admitting: Cardiovascular Disease

## 2014-10-13 VITALS — BP 100/70 | HR 61 | Ht 66.0 in | Wt 191.5 lb

## 2014-10-13 DIAGNOSIS — I639 Cerebral infarction, unspecified: Secondary | ICD-10-CM

## 2014-10-13 DIAGNOSIS — I208 Other forms of angina pectoris: Secondary | ICD-10-CM

## 2014-10-13 DIAGNOSIS — E785 Hyperlipidemia, unspecified: Secondary | ICD-10-CM

## 2014-10-13 DIAGNOSIS — I25810 Atherosclerosis of coronary artery bypass graft(s) without angina pectoris: Secondary | ICD-10-CM | POA: Diagnosis not present

## 2014-10-13 DIAGNOSIS — R079 Chest pain, unspecified: Secondary | ICD-10-CM | POA: Diagnosis not present

## 2014-10-13 NOTE — Assessment & Plan Note (Signed)
Prior history of stroke, functioning relatively well. He reports having memory issues periodically

## 2014-10-13 NOTE — Assessment & Plan Note (Signed)
No room for further medication adjustment. Recommended a regular exercise program

## 2014-10-13 NOTE — Progress Notes (Signed)
Patient ID: Kevin [name], male    DOB: 01/19/1956, 59 y.o.   MRN: [id number]  HPI Comments: Kevin [name] is a 59 y/o male with h/o CAD, atrial fib, HTN, hyperlipidemia, OSA and previous CVA,  CABG with Maze and PFO closure in February 2010,  cath in 10/2008  for exertional dyspnea and CP showing 2-V CAD. LAD 95% mid, LCX small RCA large OK.  SVG -> OM1 and OM2 was occluded. SVG-Diag was widely patent and backfilled the LAD well. LIMA - LAD was atretic. Myoview to assess LAD for ischemia with very mild reversible defect in very distal anterior wall and apex. Decision made to manage him medically, continued chest pain and cath Aug 9th 2011 with PCI of the LAD with 2 stents placed.  He presents today for follow-up of his coronary artery disease He has trouble remembering things following his stroke  Recently called with chest pain symptoms Stress test June 2016 showed no ischemia, normal ejection fraction. Images were reviewed with him in clinic today.  Wife presents with him and reports that he continues to have episodes of periodic chest pain. He does not like to take his nitroglycerin. Reports most recent pain was 7/10, resolved on its own. Seem to present at rest He is working long hours, plans to continue working with no plans for retirement No regular exercise program  EKG shows normal sinus rhythm with rate 61 bpm, nonspecific T wave abnormality  Other past medical history Cardiac catheterization last week at the end of December 2014 showing no significant change in his disease, patent stents in the LAD.  He did have nonsustained VT during the case. Impressive run of ventricular arrhythmia. He is asymptomatic at the time. This was recorded on telemetry and presented to show the family. On further discussion, it was uncertain if arrhythmia was causing his episodes of chest pain. 30 day monitor was ordered Last stress test September 2013 Last cardiac cath December 2014    Allergies  Allergen  Reactions  . Strawberry Anaphylaxis       . Dilaudid [Hydromorphone Hcl]     Vomiting     Outpatient Encounter Prescriptions as of 10/13/2014  Medication Sig  . acetaminophen (TYLENOL) 500 MG tablet Take 500 mg by mouth as needed.    . ALPRAZolam (XANAX) 0.5 MG tablet Take 0.5 mg by mouth at bedtime as needed.    Marland Kitchen aspirin 325 MG tablet Take 325 mg by mouth daily.    . cetirizine (ZYRTEC) 10 MG tablet Take 10 mg by mouth daily.    Marland Kitchen EFFIENT 10 MG TABS tablet Take 1 tablet (10 mg total) by mouth daily.  Marland Kitchen escitalopram (LEXAPRO) 20 MG tablet Take 20 mg by mouth daily.  . isosorbide mononitrate (IMDUR) 30 MG 24 hr tablet TAKE ONE TABLET BY MOUTH DAILY  . metoprolol (LOPRESSOR) 50 MG tablet TAKE ONE TABLET BY MOUTH 2 TIMES A DAY  . nitroGLYCERIN (NITROSTAT) 0.4 MG SL tablet Place 1 tablet (0.4 mg total) under the tongue every 5 (five) minutes as needed. May repeat for up to 3 doses.  . ranitidine (ZANTAC) 300 MG capsule Take 300 mg by mouth every evening.    . ranolazine (RANEXA) 1000 MG SR tablet Take 1 tablet by mouth 2 times daily.  . rosuvastatin (CRESTOR) 40 MG tablet Take 1 tablet (40 mg total) by mouth daily.   No facility-administered encounter medications on file as of 10/13/2014.    Past Medical History  Diagnosis Date  .  CHF (congestive heart failure)   . Atrial fibrillation     s/p Maze  . Hyperlipidemia   . Sleep apnea   . Thrombocytopenia   . CAD (coronary artery disease)     s/p CABG  . Overweight(278.02)   . Patent foramen ovale     s/p closure  . Stroke     right brain, 07/2005,left brain, 05/2005  . ASD (atrial septal defect)   . Hypertension   . Coronary artery disease     status post two-vessel to bypass surgery  . H/O maze procedure 05/17/08    closure of ASD  . Brachial plexopathy   . OSA (obstructive sleep apnea)   . Psoriatic arthritis   . 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     Past Surgical History   Procedure Laterality Date  . Patent foramen ovale closure    . Coronary artery bypass graft  05/12/2008    x4 by PeterVan Trigt,MD  . Cholecystectomy  08/19/2009  . Tonsillectomy      AS A CHILD  . Appendectomy  1984  . Acute pancreatitis  5/11  . Stints  8/11    X2    Social History  reports that he has never smoked. He has never used smokeless tobacco. He reports that he does not drink alcohol or use illicit drugs.  Family History family history includes Cancer in his paternal uncle; Coronary artery disease in his other; Diabetes in his other, other, and sister; Heart disease in his paternal grandfather and paternal grandmother; Hyperlipidemia in his other; Hypertension in his other; Psychiatric Illness in his father.  Review of Systems  Constitutional: Negative.   Respiratory: Negative.   Cardiovascular: Positive for chest pain.  Gastrointestinal: Negative.   Musculoskeletal: Negative.   Skin: Negative.   Neurological: Negative.   Hematological: Negative.   Psychiatric/Behavioral: Positive for sleep disturbance. Negative for behavioral problems and confusion.       Memory problems  All other systems reviewed and are negative.  BP 100/70 mmHg  Pulse 61  Ht 5\' 6"  (1.676 m)  Wt 191 lb 8 oz (86.864 kg)  BMI 30.92 kg/m2  Physical Exam  Constitutional: He is oriented to person, place, and time. He appears well-developed and well-nourished.  HENT:  Head: Normocephalic.  Nose: Nose normal.  Mouth/Throat: Oropharynx is clear and moist.  Eyes: Conjunctivae are normal. Pupils are equal, round, and reactive to light.  Neck: Normal range of motion. Neck supple. No JVD present.  Cardiovascular: Normal rate, regular rhythm, S1 normal, S2 normal, normal heart sounds and intact distal pulses.  Exam reveals no gallop and no friction rub.   No murmur heard. Pulmonary/Chest: Effort normal and breath sounds normal. No respiratory distress. He has no wheezes. He has no rales. He  exhibits no tenderness.  Abdominal: Soft. Bowel sounds are normal. He exhibits no distension. There is no tenderness.  Musculoskeletal: Normal range of motion. He exhibits no edema or tenderness.  Lymphadenopathy:    He has no cervical adenopathy.  Neurological: He is alert and oriented to person, place, and time. Coordination normal.  Skin: Skin is warm and dry. No rash noted. No erythema.  Psychiatric: He has a normal mood and affect. His behavior is normal. Judgment and thought content normal.      Assessment and Plan   Nursing note and vitals reviewed.

## 2014-10-13 NOTE — Assessment & Plan Note (Signed)
Stable angina. Encouraged him to take nitroglycerin as needed for chest pain symptoms. Chest pain symptoms are chronic Recent stress test showing no ischemia June 2016

## 2014-10-13 NOTE — Patient Instructions (Signed)
You are doing well. No medication changes were made.  Please call us if you have new issues that need to be addressed before your next appt.  Your physician wants you to follow-up in: 6 months.  You will receive a reminder letter in the mail two months in advance. If you don't receive a letter, please call our office to schedule the follow-up appointment.   

## 2014-10-13 NOTE — Assessment & Plan Note (Signed)
LDL slightly above goal. Consider adding zetia 10 mg daily in follow-up

## 2014-11-19 ENCOUNTER — Other Ambulatory Visit: Payer: Self-pay | Admitting: Family Medicine

## 2014-12-10 ENCOUNTER — Other Ambulatory Visit: Payer: Self-pay | Admitting: Cardiovascular Disease

## 2014-12-23 ENCOUNTER — Other Ambulatory Visit: Payer: Self-pay | Admitting: Family Medicine

## 2015-01-28 ENCOUNTER — Other Ambulatory Visit: Payer: Self-pay | Admitting: Family Medicine

## 2015-03-14 ENCOUNTER — Other Ambulatory Visit: Payer: Self-pay | Admitting: Family Medicine

## 2015-04-13 ENCOUNTER — Encounter: Payer: Self-pay | Admitting: Cardiovascular Disease

## 2015-04-13 ENCOUNTER — Ambulatory Visit (INDEPENDENT_AMBULATORY_CARE_PROVIDER_SITE_OTHER): Payer: 59 | Admitting: Cardiovascular Disease

## 2015-04-13 VITALS — BP 110/70 | HR 61 | Ht 66.0 in | Wt 191.0 lb

## 2015-04-13 DIAGNOSIS — I25810 Atherosclerosis of coronary artery bypass graft(s) without angina pectoris: Secondary | ICD-10-CM | POA: Diagnosis not present

## 2015-04-13 DIAGNOSIS — E785 Hyperlipidemia, unspecified: Secondary | ICD-10-CM | POA: Diagnosis not present

## 2015-04-13 DIAGNOSIS — Z79899 Other long term (current) drug therapy: Secondary | ICD-10-CM

## 2015-04-13 DIAGNOSIS — I4891 Unspecified atrial fibrillation: Secondary | ICD-10-CM | POA: Diagnosis not present

## 2015-04-13 DIAGNOSIS — I208 Other forms of angina pectoris: Secondary | ICD-10-CM

## 2015-04-13 DIAGNOSIS — I69319 Unspecified symptoms and signs involving cognitive functions following cerebral infarction: Secondary | ICD-10-CM

## 2015-04-13 NOTE — Assessment & Plan Note (Signed)
Order placed to have his lab work checked through our office, fasting  If cholesterol runs high, may need to start zetia  10 mg daily

## 2015-04-13 NOTE — Assessment & Plan Note (Signed)
Recommended low-dose aspirin,  Can stay on effient  Given  History of stroke

## 2015-04-13 NOTE — Assessment & Plan Note (Signed)
Currently with no symptoms of angina. No further workup at this time. Continue current medication regimen. 

## 2015-04-13 NOTE — Progress Notes (Signed)
Patient ID: Kevin [name], male    DOB: 16-Oct-1955, 60 y.o.   MRN: [id number]  HPI Comments: [name] is a 60 y/o male with h/o CAD, atrial fib, HTN, hyperlipidemia, OSA and previous CVA,  CABG with Maze and PFO closure in February 2010,  cath in 10/2008  for exertional dyspnea and CP showing 2-V CAD. LAD 95% mid, LCX small RCA large OK.  SVG -> OM1 and OM2 was occluded. SVG-Diag was widely patent and backfilled the LAD well. LIMA - LAD was atretic. Myoview to assess LAD for ischemia with very mild reversible defect in very distal anterior wall and apex. Decision made to manage him medically, continued chest pain and cath Aug 9th 2011 with PCI of the LAD with 2 stents placed.  He presents today for follow-up of his coronary artery disease He has trouble remembering things following his stroke   in follow-up today, he presents with his wife  He is not exercising, working long hours at work  Rare episodes of chest pain, otherwise symptoms have been stable  No recent lab work available   EKG on today's visit shows normal sinus rhythm with rate 61 bpm, no significant ST or T-wave changes  Stress test June 2016 showed no ischemia, normal ejection fraction.  Cardiac catheterization last week at the end of December 2014 showing no significant change in his disease, patent stents in the LAD.  He did have nonsustained VT during the case. Impressive run of ventricular arrhythmia. He is asymptomatic at the time. This was recorded on telemetry and presented to show the family. On further discussion, it was uncertain if arrhythmia was causing his episodes of chest pain. 30 day monitor was ordered Last stress test September 2013 Last cardiac cath December 2014    Allergies  Allergen Reactions  . Strawberry Extract Anaphylaxis       . Dilaudid [Hydromorphone Hcl]     Vomiting     Outpatient Encounter Prescriptions as of 04/13/2015  Medication Sig  . acetaminophen (TYLENOL) 500 MG tablet Take 500 mg  by mouth as needed.    . ALPRAZolam (XANAX) 0.5 MG tablet TAKE 1 TO 2 TABS BY MOUTH 2 TIMES A DAY  . aspirin 81 MG tablet Take 1 tablet (81 mg total) by mouth daily.  . cetirizine (ZYRTEC) 10 MG tablet Take 10 mg by mouth daily.    Marland Kitchen EFFIENT 10 MG TABS tablet Take 1 tablet (10 mg total) by mouth daily.  . isosorbide mononitrate (IMDUR) 30 MG 24 hr tablet TAKE ONE TABLET BY MOUTH DAILY  . LEXAPRO 20 MG tablet TAKE 1 TABLET BY MOUTH ONCE DAILY  . metoprolol (LOPRESSOR) 50 MG tablet TAKE ONE TABLET BY MOUTH 2 TIMES A DAY  . nitroGLYCERIN (NITROSTAT) 0.4 MG SL tablet Place 1 tablet (0.4 mg total) under the tongue every 5 (five) minutes as needed. May repeat for up to 3 doses.  Marland Kitchen RANEXA 1000 MG SR tablet TAKE 1 TABLET BY MOUTH 2 TIMES DAILY  . ranitidine (ZANTAC) 300 MG capsule Take 300 mg by mouth every evening.    . rosuvastatin (CRESTOR) 40 MG tablet Take 1 tablet (40 mg total) by mouth daily.  . [DISCONTINUED] aspirin 325 MG tablet Take 325 mg by mouth daily.    . [DISCONTINUED] ranitidine (ZANTAC) 300 MG tablet TAKE 1 TABLET BY MOUTH ONCE DAILY (Patient not taking: Reported on 04/13/2015)   No facility-administered encounter medications on file as of 04/13/2015.    Past Medical History  Diagnosis Date  . CHF (congestive heart failure) ([place])   . Atrial fibrillation ([place])     s/p Maze  . Hyperlipidemia   . Sleep apnea   . Thrombocytopenia ([place])   . CAD (coronary artery disease)     s/p CABG  . Overweight(278.02)   . Patent foramen ovale     s/p closure  . Stroke [facility])     right brain, 07/2005,left brain, 05/2005  . ASD (atrial septal defect)   . Hypertension   . Coronary artery disease     status post two-vessel to bypass surgery  . H/O maze procedure 05/17/08    closure of ASD  . Brachial plexopathy   . OSA (obstructive sleep apnea)   . Psoriatic arthritis ([place])   . 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     Past Surgical History  Procedure  Laterality Date  . Patent foramen ovale closure    . Coronary artery bypass graft  05/12/2008    x4 by PeterVan Trigt,MD  . Cholecystectomy  08/19/2009  . Tonsillectomy      AS A CHILD  . Appendectomy  1984  . Acute pancreatitis  5/11  . Stints  8/11    X2    Social History  reports that he has never smoked. He has never used smokeless tobacco. He reports that he does not drink alcohol or use illicit drugs.  Family History family history includes Cancer in his paternal uncle; Coronary artery disease in his other; Diabetes in his other, other, and sister; Heart disease in his paternal grandfather and paternal grandmother; Hyperlipidemia in his other; Hypertension in his other; Psychiatric Illness in his father.  Review of Systems  Constitutional: Negative.   Respiratory: Negative.   Cardiovascular: Negative.   Gastrointestinal: Negative.   Musculoskeletal: Negative.   Skin: Negative.   Neurological: Negative.   Hematological: Negative.   Psychiatric/Behavioral: Negative.  Negative for behavioral problems and confusion.       Memory problems  All other systems reviewed and are negative.  BP 110/70 mmHg  Pulse 61  Ht 5\' 6"  (1.676 m)  Wt 191 lb (86.637 kg)  BMI 30.84 kg/m2  Physical Exam  Constitutional: He is oriented to person, place, and time. He appears well-developed and well-nourished.  HENT:  Head: Normocephalic.  Nose: Nose normal.  Mouth/Throat: Oropharynx is clear and moist.  Eyes: Conjunctivae are normal. Pupils are equal, round, and reactive to light.  Neck: Normal range of motion. Neck supple. No JVD present.  Cardiovascular: Normal rate, regular rhythm, S1 normal, S2 normal, normal heart sounds and intact distal pulses.  Exam reveals no gallop and no friction rub.   No murmur heard. Pulmonary/Chest: Effort normal and breath sounds normal. No respiratory distress. He has no wheezes. He has no rales. He exhibits no tenderness.  Abdominal: Soft. Bowel sounds are  normal. He exhibits no distension. There is no tenderness.  Musculoskeletal: Normal range of motion. He exhibits no edema or tenderness.  Lymphadenopathy:    He has no cervical adenopathy.  Neurological: He is alert and oriented to person, place, and time. Coordination normal.  Skin: Skin is warm and dry. No rash noted. No erythema.  Psychiatric: He has a normal mood and affect. His behavior is normal. Judgment and thought content normal.      Assessment and Plan   Nursing note and vitals reviewed.

## 2015-04-13 NOTE — Assessment & Plan Note (Signed)
Denies having significant chest pain  Perhaps rare episodes, no escalation in symptoms  Recommended he decrease aspirin down to 81 mg daily

## 2015-04-13 NOTE — Patient Instructions (Addendum)
You are doing well. No medication changes were made.  We will check fasting liver and lipids at your convenience  Please decrease the aspirin down to 81 mg daily with effient  Please call us if you have new issues that need to be addressed before your next appt.  Your physician wants you to follow-up in: 6 months.  You will receive a reminder letter in the mail two months in advance. If you don't receive a letter, please call our office to schedule the follow-up appointment.

## 2015-04-19 ENCOUNTER — Other Ambulatory Visit: Payer: Self-pay | Admitting: Cardiovascular Disease

## 2015-04-27 ENCOUNTER — Telehealth: Payer: Self-pay

## 2015-04-27 NOTE — Telephone Encounter (Signed)
Spoke with Mr. Apodaca and he stated, that the Effient has been approved through his insurance.

## 2015-05-23 ENCOUNTER — Other Ambulatory Visit: Payer: Self-pay | Admitting: Cardiovascular Disease

## 2015-06-01 ENCOUNTER — Other Ambulatory Visit: Payer: Self-pay | Admitting: Family Medicine

## 2015-06-03 ENCOUNTER — Other Ambulatory Visit: Payer: Self-pay | Admitting: Family Medicine

## 2015-06-03 NOTE — Telephone Encounter (Signed)
Please check on this. This should have been called in earlier this week. See update in the EMR

## 2015-06-03 NOTE — Telephone Encounter (Signed)
Rx has been called into pharmacy. KW 

## 2015-07-23 ENCOUNTER — Ambulatory Visit (INDEPENDENT_AMBULATORY_CARE_PROVIDER_SITE_OTHER): Payer: 59 | Admitting: Family Medicine

## 2015-07-23 ENCOUNTER — Encounter: Payer: Self-pay | Admitting: Family Medicine

## 2015-07-23 VITALS — BP 110/72 | HR 60 | Temp 97.4°F | Resp 16 | Wt 190.0 lb

## 2015-07-23 DIAGNOSIS — J040 Acute laryngitis: Secondary | ICD-10-CM | POA: Diagnosis not present

## 2015-07-23 DIAGNOSIS — J0100 Acute maxillary sinusitis, unspecified: Secondary | ICD-10-CM | POA: Diagnosis not present

## 2015-07-23 MED ORDER — AMOXICILLIN-POT CLAVULANATE 875-125 MG PO TABS: 1.0000 | ORAL_TABLET | Freq: Two times a day (BID) | ORAL | Status: DC

## 2015-07-23 NOTE — Progress Notes (Signed)
Patient ID: Kevin [name], male   DOB: September 19, 1955, 60 y.o.   MRN: [id number]       Patient: Kevin [name] Male    DOB: 11/14/1955   60 y.o.   MRN: [id number] Visit Date: 07/23/2015  Today's Provider: Vernie Murders, PA   Chief Complaint  Patient presents with  . URI   Subjective:    URI  This is a new problem. The current episode started in the past 7 days. The problem has been gradually worsening. Associated symptoms include congestion, coughing, headaches, sinus pain, a sore throat and wheezing. He has tried acetaminophen, decongestant and antihistamine for the symptoms. The treatment provided mild relief.    Past Medical History  Diagnosis Date  . CHF (congestive heart failure) ([place])   . Atrial fibrillation ([place])     s/p Maze  . Hyperlipidemia   . Sleep apnea   . Thrombocytopenia ([place])   . CAD (coronary artery disease)     s/p CABG  . Overweight(278.02)   . Patent foramen ovale     s/p closure  . Stroke [facility])     right brain, 07/2005,left brain, 05/2005  . ASD (atrial septal defect)   . Hypertension   . Coronary artery disease     status post two-vessel to bypass surgery  . H/O maze procedure 05/17/08    closure of ASD  . Brachial plexopathy   . OSA (obstructive sleep apnea)   . Psoriatic arthritis ([place])   . 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    Past Surgical History  Procedure Laterality Date  . Patent foramen ovale closure    . Coronary artery bypass graft  05/12/2008    x4 by PeterVan Trigt,MD  . Cholecystectomy  08/19/2009  . Tonsillectomy      AS A CHILD  . Appendectomy  1984  . Acute pancreatitis  5/11  . Stints  8/11    X2   Family History  Problem Relation Age of Onset  . Coronary artery disease Other   . Diabetes Other   . Hypertension Other   . Hyperlipidemia Other   . Diabetes Sister   . Psychiatric Illness Father   . Cancer Paternal Uncle   . Heart disease Paternal Grandmother   . Heart disease  Paternal Grandfather   . Diabetes Other    Allergies  Allergen Reactions  . Strawberry Extract Anaphylaxis       . Dilaudid [Hydromorphone Hcl]     Vomiting    Previous Medications   ACETAMINOPHEN (TYLENOL) 500 MG TABLET    Take 500 mg by mouth as needed.     ALPRAZOLAM (XANAX) 0.5 MG TABLET    TAKE 1 TO 2 TABLETS BY MOUTH NIGHTLY AT BEDTIME   ASPIRIN 81 MG TABLET    Take 1 tablet (81 mg total) by mouth daily.   CETIRIZINE (ZYRTEC) 10 MG TABLET    Take 10 mg by mouth daily.     EFFIENT 10 MG TABS TABLET    TAKE 1 TABLET BY MOUTH DAILY   ISOSORBIDE MONONITRATE (IMDUR) 30 MG 24 HR TABLET    TAKE ONE TABLET BY MOUTH DAILY   LEXAPRO 20 MG TABLET    TAKE 1 TABLET BY MOUTH ONCE DAILY   METOPROLOL (LOPRESSOR) 50 MG TABLET    TAKE ONE TABLET BY MOUTH 2 TIMES A DAY   NITROGLYCERIN (NITROSTAT) 0.4 MG SL TABLET    Place 1  tablet (0.4 mg total) under the tongue every 5 (five) minutes as needed. May repeat for up to 3 doses.   RANEXA 1000 MG SR TABLET    TAKE 1 TABLET BY MOUTH 2 TIMES DAILY   RANITIDINE (ZANTAC) 300 MG CAPSULE    Take 300 mg by mouth every evening.     ROSUVASTATIN (CRESTOR) 40 MG TABLET    Take 1 tablet (40 mg total) by mouth daily.    Review of Systems  Constitutional: Negative.   HENT: Positive for congestion, postnasal drip, sore throat and voice change.   Eyes: Negative.   Respiratory: Positive for cough and wheezing.   Neurological: Positive for headaches.    Social History  Substance Use Topics  . Smoking status: Never Smoker   . Smokeless tobacco: Never Used  . Alcohol Use: No   Objective:   BP 110/72 mmHg  Pulse 60  Temp(Src) 97.4 F (36.3 C) (Oral)  Resp 16  Wt 190 lb (86.183 kg)  SpO2 96%  Physical Exam  Constitutional: He is oriented to person, place, and time. He appears well-developed and well-nourished. No distress.  HENT:  Head: Normocephalic and atraumatic.  Right Ear: Hearing and external ear normal.  Left Ear: Hearing and external ear  normal.  Nose: Nose normal.  Mouth/Throat: Oropharynx is clear and moist.  Tender maxillary sinuses (right cloudy to try to transilluminate). Left posterior molar extraction on the lower jaw healing well without purulent drainage (extracted 3 days ago).  Eyes: Conjunctivae and lids are normal. Right eye exhibits no discharge. Left eye exhibits no discharge. No scleral icterus.  Neck: Neck supple.  Cardiovascular: Normal rate, regular rhythm and normal heart sounds.   Pulmonary/Chest: Effort normal and breath sounds normal. No respiratory distress.  Abdominal: Soft. Bowel sounds are normal.  Musculoskeletal: Normal range of motion.  Lymphadenopathy:    He has no cervical adenopathy.  Neurological: He is alert and oriented to person, place, and time.  Skin: Skin is intact. No lesion and no rash noted.  Psychiatric: He has a normal mood and affect. His speech is normal and behavior is normal. Thought content normal.      Assessment & Plan:     1. Acute maxillary sinusitis, recurrence not specified Onset over the past week or two. Worsening since tooth extraction 3 days ago. Post nasal drip causing some ticklish cough and laryngitis. May use Mucinex-DM prn cough. Treat with Augmentin and increase fluid intake. May use Tylenol prn pain and recheck prn. - amoxicillin-clavulanate (AUGMENTIN) 875-125 MG tablet; Take 1 tablet by mouth 2 (two) times daily.  Dispense: 20 tablet; Refill: 0  2. Laryngitis Onset in the past couple days with post nasal drip from sinusitis. May gargle with warm saltwater and rest voice. Recheck prn.        Vernie Murders, PA  [facility]

## 2015-07-23 NOTE — Patient Instructions (Signed)
Sinusitis, Adult  Sinusitis is redness, soreness, and inflammation of the paranasal sinuses. Paranasal sinuses are air pockets within the bones of your face. They are located beneath your eyes, in the middle of your forehead, and above your eyes. In healthy paranasal sinuses, mucus is able to drain out, and air is able to circulate through them by way of your nose. However, when your paranasal sinuses are inflamed, mucus and air can become trapped. This can allow bacteria and other germs to grow and cause infection.  Sinusitis can develop quickly and last only a short time (acute) or continue over a long period (chronic). Sinusitis that lasts for more than 12 weeks is considered chronic.  CAUSES  Causes of sinusitis include:  · Allergies.  · Structural abnormalities, such as displacement of the cartilage that separates your nostrils (deviated septum), which can decrease the air flow through your nose and sinuses and affect sinus drainage.  · Functional abnormalities, such as when the small hairs (cilia) that line your sinuses and help remove mucus do not work properly or are not present.  SIGNS AND SYMPTOMS  Symptoms of acute and chronic sinusitis are the same. The primary symptoms are pain and pressure around the affected sinuses. Other symptoms include:  · Upper toothache.  · Earache.  · Headache.  · Bad breath.  · Decreased sense of smell and taste.  · A cough, which worsens when you are lying flat.  · Fatigue.  · Fever.  · Thick drainage from your nose, which often is green and may contain pus (purulent).  · Swelling and warmth over the affected sinuses.  DIAGNOSIS  Your health care provider will perform a physical exam. During your exam, your health care provider may perform any of the following to help determine if you have acute sinusitis or chronic sinusitis:  · Look in your nose for signs of abnormal growths in your nostrils (nasal polyps).  · Tap over the affected sinus to check for signs of  infection.  · View the inside of your sinuses using an imaging device that has a light attached (endoscope).  If your health care provider suspects that you have chronic sinusitis, one or more of the following tests may be recommended:  · Allergy tests.  · Nasal culture. A sample of mucus is taken from your nose, sent to a lab, and screened for bacteria.  · Nasal cytology. A sample of mucus is taken from your nose and examined by your health care provider to determine if your sinusitis is related to an allergy.  TREATMENT  Most cases of acute sinusitis are related to a viral infection and will resolve on their own within 10 days. Sometimes, medicines are prescribed to help relieve symptoms of both acute and chronic sinusitis. These may include pain medicines, decongestants, nasal steroid sprays, or saline sprays.  However, for sinusitis related to a bacterial infection, your health care provider will prescribe antibiotic medicines. These are medicines that will help kill the bacteria causing the infection.  Rarely, sinusitis is caused by a fungal infection. In these cases, your health care provider will prescribe antifungal medicine.  For some cases of chronic sinusitis, surgery is needed. Generally, these are cases in which sinusitis recurs more than 3 times per year, despite other treatments.  HOME CARE INSTRUCTIONS  · Drink plenty of water. Water helps thin the mucus so your sinuses can drain more easily.  · Use a humidifier.  · Inhale steam 3-4 times a day (for   example, sit in the bathroom with the shower running).  · Apply a warm, moist washcloth to your face 3-4 times a day, or as directed by your health care provider.  · Use saline nasal sprays to help moisten and clean your sinuses.  · Take medicines only as directed by your health care provider.  · If you were prescribed either an antibiotic or antifungal medicine, finish it all even if you start to feel better.  SEEK IMMEDIATE MEDICAL CARE IF:  · You have  increasing pain or severe headaches.  · You have nausea, vomiting, or drowsiness.  · You have swelling around your face.  · You have vision problems.  · You have a stiff neck.  · You have difficulty breathing.     This information is not intended to replace advice given to you by your health care provider. Make sure you discuss any questions you have with your health care provider.     Document Released: 03/19/2005 Document Revised: 04/09/2014 Document Reviewed: 04/03/2011  Elsevier Interactive Patient Education ©2016 Elsevier Inc.    Laryngitis  Laryngitis is inflammation of your vocal cords. This causes hoarseness, coughing, loss of voice, sore throat, or a dry throat. Your vocal cords are two bands of muscles that are found in your throat. When you speak, these cords come together and vibrate. These vibrations come out through your mouth as sound. When your vocal cords are inflamed, your voice sounds different.  Laryngitis can be temporary (acute) or long-term (chronic). Most cases of acute laryngitis improve with time. Chronic laryngitis is laryngitis that lasts for more than three weeks.  CAUSES  Acute laryngitis may be caused by:  · A viral infection.  · Lots of talking, yelling, or singing. This is also called vocal strain.  · Bacterial infections.  Chronic laryngitis may be caused by:  · Vocal strain.  · Injury to your vocal cords.  · Acid reflux (gastroesophageal reflux disease or GERD).  · Allergies.  · Sinus infection.  · Smoking.  · Alcohol abuse.  · Breathing in chemicals or dust.  · Growths on the vocal cords.  RISK FACTORS  Risk factors for laryngitis include:  · Smoking.  · Alcohol abuse.  · Having allergies.  SIGNS AND SYMPTOMS  Symptoms of laryngitis may include:  · Low, hoarse voice.  · Loss of voice.  · Dry cough.  · Sore throat.  · Stuffy nose.  DIAGNOSIS  Laryngitis may be diagnosed by:  · Physical exam.  · Throat culture.  · Blood test.  · Laryngoscopy. This procedure allows your health care  provider to look at your vocal cords with a mirror or viewing tube.  TREATMENT  Treatment for laryngitis depends on what is causing it. Usually, treatment involves resting your voice and using medicines to soothe your throat. However, if your laryngitis is caused by a bacterial infection, you may need to take antibiotic medicine. If your laryngitis is caused by a growth, you may need to have a procedure to remove it.  HOME CARE INSTRUCTIONS  · Drink enough fluid to keep your urine clear or pale yellow.  · Breathe in moist air. Use a humidifier if you live in a dry climate.  · Take medicines only as directed by your health care provider.  · If you were prescribed an antibiotic medicine, finish it all even if you start to feel better.  · Do not smoke cigarettes or electronic cigarettes. If you need help quitting, ask your   health care provider.  · Talk as little as possible. Also avoid whispering, which can cause vocal strain.  · Write instead of talking. Do this until your voice is back to normal.  SEEK MEDICAL CARE IF:  · You have a fever.  · You have increasing pain.  · You have difficulty swallowing.  SEEK IMMEDIATE MEDICAL CARE IF:  · You cough up blood.  · You have trouble breathing.     This information is not intended to replace advice given to you by your health care provider. Make sure you discuss any questions you have with your health care provider.     Document Released: 03/19/2005 Document Revised: 04/09/2014 Document Reviewed: 09/01/2013  Elsevier Interactive Patient Education ©2016 Elsevier Inc.

## 2015-08-01 ENCOUNTER — Other Ambulatory Visit: Payer: Self-pay | Admitting: Family Medicine

## 2015-08-01 ENCOUNTER — Other Ambulatory Visit: Payer: Self-pay | Admitting: Cardiovascular Disease

## 2015-08-15 ENCOUNTER — Other Ambulatory Visit: Payer: Self-pay | Admitting: Cardiovascular Disease

## 2015-09-22 DIAGNOSIS — F419 Anxiety disorder, unspecified: Secondary | ICD-10-CM | POA: Insufficient documentation

## 2015-09-22 DIAGNOSIS — I10 Essential (primary) hypertension: Secondary | ICD-10-CM | POA: Insufficient documentation

## 2015-09-22 DIAGNOSIS — G4733 Obstructive sleep apnea (adult) (pediatric): Secondary | ICD-10-CM | POA: Insufficient documentation

## 2015-09-22 DIAGNOSIS — J309 Allergic rhinitis, unspecified: Secondary | ICD-10-CM | POA: Insufficient documentation

## 2015-09-28 ENCOUNTER — Ambulatory Visit: Payer: 59 | Admitting: Adult Health

## 2015-09-29 DIAGNOSIS — Z85828 Personal history of other malignant neoplasm of skin: Secondary | ICD-10-CM | POA: Diagnosis not present

## 2015-09-29 DIAGNOSIS — D225 Melanocytic nevi of trunk: Secondary | ICD-10-CM | POA: Diagnosis not present

## 2015-09-29 DIAGNOSIS — L570 Actinic keratosis: Secondary | ICD-10-CM | POA: Diagnosis not present

## 2015-09-29 DIAGNOSIS — L218 Other seborrheic dermatitis: Secondary | ICD-10-CM | POA: Diagnosis not present

## 2015-09-29 DIAGNOSIS — L400 Psoriasis vulgaris: Secondary | ICD-10-CM | POA: Diagnosis not present

## 2015-09-29 DIAGNOSIS — X32XXXA Exposure to sunlight, initial encounter: Secondary | ICD-10-CM | POA: Diagnosis not present

## 2015-10-21 ENCOUNTER — Other Ambulatory Visit: Payer: Self-pay | Admitting: Family Medicine

## 2015-11-03 ENCOUNTER — Ambulatory Visit (INDEPENDENT_AMBULATORY_CARE_PROVIDER_SITE_OTHER): Payer: 59 | Admitting: Adult Health

## 2015-11-03 ENCOUNTER — Encounter: Payer: Self-pay | Admitting: Adult Health

## 2015-11-03 VITALS — BP 98/61 | HR 56 | Ht 66.0 in | Wt 190.0 lb

## 2015-11-03 DIAGNOSIS — IMO0002 Reserved for concepts with insufficient information to code with codable children: Secondary | ICD-10-CM

## 2015-11-03 DIAGNOSIS — Z8673 Personal history of transient ischemic attack (TIA), and cerebral infarction without residual deficits: Secondary | ICD-10-CM

## 2015-11-03 DIAGNOSIS — F09 Unspecified mental disorder due to known physiological condition: Secondary | ICD-10-CM | POA: Diagnosis not present

## 2015-11-03 NOTE — Patient Instructions (Addendum)
Continue Aspirin  Blood Pressure <130/90 Cholesterl LDL <70 HgbA1c <6.5 % Memory score is stable- will continue to monitor If your symptoms worsen or you develop new symptoms please let us know.

## 2015-11-03 NOTE — Progress Notes (Signed)
PATIENT: Monique Hoeg DOB: [birth year]  REASON FOR VISIT: follow up- cognitive slowing, stroke HISTORY FROM: patient  HISTORY OF PRESENT ILLNESS: Mr. Polley is a 60 year old male with a history of stroke and cognitive slowing. He returns today for follow-up. The patient feels that he has remained stable. He continues to notice trouble with his memory although he feels it is not any worse. 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 He remains on aspirin. His primary care and cardiologist managing his blood pressure and cholesterol. He is not diabetic. He does not smoke. He denies any new neurological symptoms. He returns today for an evaluation.  HISTORY Mr. Conrow  Is a 60 year old male with a history of strokes and residual cognitive slowing. He returns today for follow-up. The patient feels that his cognition has remained the same. He does not feel that things have gotten worse. 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 He is followed by a cardiologist who manages his blood pressure and cholesterol. He denies any additional strokelike symptoms. He also states that his fatigue has improved over the years. He states that he has learned how to manage it better. The patient continues to take aspirin as secondary stroke prevention.  Patient states that his short-term memory is most affected. However he states that he can normally recall things if prompted. He denies any new medical issues. He returns today for an evaluation.  HISTORY 09/24/13: KORDE HOPKIN is a 60 y.o. male here as a follow up visit, after last year's referral for transition of care from Dr. Erling Cruz. HST in 2013 negative for OSA, Epworth score is 18, FSS 43 points, hypersomnia persistent. 2007 two strokes , in March affecting the left and in May 2007 affecting right hemisphere. He has typical  right brain deficits based on his neuropsychological testing. 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 His spouse feels that his performance is limited and work routines are too Film/video editor for him. He needs to re check his own work, all this takes extra time, he cannot multitask.  Last visit note: Patient was originally referred for hypersomnia by Dr Erling Cruz 03-21-12, after he reported being noncompliant with CPAP. At the time I ordered a home sleep test which returned as an AHI of less than 5 and basically would not qualify the patient for CPAP use or doesn't indicate the need of CPAP. Mr. 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 Again meanwhile his apnea index is less than 5 and therefore not longer in need of CPAP treatment. Would have been present isn't elevated or excessive daytime sleepiness with Epworth score is 17/24 at the time of his home sleep test as ordered by Dr. Erling Cruz, and also today he again and [name] an Epworth sleepiness score of 13 point less than in last December but still slightly elevated.Goes to bed at 10:30  PM, falls asleep very quickly and arises in the morning at 7:30. He may have one or none bathroom break. And intermittent slowing only the patient does not snore himself awake.The patient reports that if he has a chance to take a nap he will and it'll take between 2-1/2 hours. He seems to be at his sleep he is between 11 AM and 2 was 3 PM. The patient also falls regularly asleep in front of the TV at night at about 6:30 PM . He states that he  cannot stay awake until the news.I was able today to review all sleep study if the whole sleep test, the previous polysomnography from 11-24-10 and CPAP interpretation from 9-22,012. Dr. Morene Antu is the referring physician for all 3 studies. The patient was no able to lose weight , was 170 pounds in 2012 now 188 lbs.  His past medical history is summarized here in short:  Patient suffered a left brain stroke in March 2007 at age 89, another stroke in the right posterior cerebral artery on 08-21-2005. 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 He underwent a coronary artery disease 4 vessel bypass surgery and patch closure by Dr. Nils Pyle on 2-15 2010. At the time he had complained of intermittent diplopia as a residual from his stroke symptoms, he also had cognitive issues. A neuropsychological battery testing in September 2010 showed that his cognitive abilities were well in the average range and his executive function was intact. But he had deficits for complex visual attention, shifting attention and multitasking multiple stimuli applied simultaneously did result in confusion. His memory findings were typical of of right brain dysfunction . The patient is a Armed forces operational officer ( flooring) and noted that he becomes more fatigued and probably less attentive with the ongoing day at work.  Toward the end of the day he would often develop a tinnitus which he feels distracts him . He feels that: Naps in the morning or early afternoon can help him to sustain productivity through the day, he feels less fatigued less distractible and more able to concentrate.  REVIEW OF SYSTEMS: Out of a complete 14 system review of symptoms, the patient complains only of the following symptoms, and all other reviewed systems are negative.  Walking difficulty, runny nose, tremors  ALLERGIES: Allergies  Allergen Reactions  . Strawberry  Extract Anaphylaxis       . Dilaudid [Hydromorphone Hcl]     Vomiting     HOME MEDICATIONS: Outpatient Medications Prior to Visit  Medication Sig Dispense Refill  . acetaminophen (TYLENOL) 500 MG tablet Take 500 mg by mouth as needed.      . ALPRAZolam (XANAX) 0.5 MG tablet TAKE 1 TO 2 TABLETS BY MOUTH AT BEDTIME 90 tablet 1  . aspirin 81 MG tablet Take 1 tablet (81 mg total) by mouth daily.    . cetirizine (ZYRTEC) 10 MG tablet Take 10 mg by mouth daily.      Marland Kitchen EFFIENT 10 MG TABS tablet TAKE 1 TABLET BY MOUTH DAILY 90 tablet 3  . isosorbide mononitrate (IMDUR) 30 MG 24 hr tablet TAKE ONE TABLET BY MOUTH DAILY 90 tablet 3  . LEXAPRO 20 MG tablet TAKE 1 TABLET BY MOUTH ONCE DAILY 90 tablet 1  . metoprolol (LOPRESSOR) 50 MG tablet TAKE ONE TABLET BY MOUTH 2 TIMES A DAY 60 tablet 3  . nitroGLYCERIN (NITROSTAT) 0.4 MG SL tablet Place 1 tablet (  0.4 mg total) under the tongue every 5 (five) minutes as needed. May repeat for up to 3 doses. 25 tablet 6  . RANEXA 1000 MG SR tablet TAKE 1 TABLET BY MOUTH 2 TIMES DAILY 180 tablet 3  . ranitidine (ZANTAC) 300 MG capsule Take 300 mg by mouth every evening.      . rosuvastatin (CRESTOR) 40 MG tablet TAKE 1 TABLET (40 MG TOTAL) BY MOUTH DAILY. 90 tablet 3  . amoxicillin-clavulanate (AUGMENTIN) 875-125 MG tablet Take 1 tablet by mouth 2 (two) times daily. (Patient not taking: Reported on 11/03/2015) 20 tablet 0   No facility-administered medications prior to visit.     PAST MEDICAL HISTORY: Past Medical History:  Diagnosis Date  . ASD (atrial septal defect)   . Atrial fibrillation ([place])    s/p Maze  . Brachial plexopathy   . CAD (coronary artery disease)    s/p CABG  . CHF (congestive heart failure) ([place])   . Coronary artery disease    status post two-vessel to bypass surgery  . H/O maze procedure 05/17/08   closure of ASD  . Hyperlipidemia   . 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   . Hypertension   .  OSA (obstructive sleep apnea)   . Overweight(278.02)   . Patent foramen ovale    s/p closure  . Psoriatic arthritis ([place])   . Sleep apnea   . Stroke [facility])    right brain, 07/2005,left brain, 05/2005  . Thrombocytopenia ([place])     PAST SURGICAL HISTORY: Past Surgical History:  Procedure Laterality Date  . ACUTE PANCREATITIS  5/11  . APPENDECTOMY  1984  . CHOLECYSTECTOMY  08/19/2009  . CORONARY ARTERY BYPASS GRAFT  05/12/2008   x4 by PeterVan Trigt,MD  . PATENT FORAMEN OVALE CLOSURE    . STINTS  8/11   X2  . TONSILLECTOMY     AS A CHILD    FAMILY HISTORY: Family History  Problem Relation Age of Onset  . Coronary artery disease Other   . Diabetes Other   . Hypertension Other   . Hyperlipidemia Other   . Diabetes Sister   . Ovarian cancer Sister   . Psychiatric Illness Father   . Heart attack Father   . Cancer Paternal Uncle   . Heart disease Paternal Grandmother   . Heart disease Paternal Grandfather   . Diabetes Other   . Hypertension Mother   . Heart disease Mother   . Lung cancer Paternal Aunt     SOCIAL HISTORY: Social History   Social History  . Marital status: Married    Spouse name: [name]  . Number of children: 3  . Years of education: N/A   Occupational History  . Full time [employer]   Social History Main Topics  . Smoking status: Never Smoker  . Smokeless tobacco: Never Used  . Alcohol use No  . Drug use: No  . Sexual activity: Not on file   Other Topics Concern  . 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 Went to high school. He has worked at his present job for 22 years. He has been married for 34 years. They have 3 children. 2 of his daughters live in [place]. He drinks less than two cups of caffeine per day. He does not use  tobacco or recreational  drugs. He has rare alcohol intake. Lives with wife and daughter       OSA diagnosed at [facility] , had his  sleep studies there  reviewed them all in detail today he  has retrognathia, sinusitis,  rhinitis      His ESS remains very high  at 16 and FSS at 35 . His falls assessment tool score is 5.   nasonex, refitted mask, the recent  HST failed to document that  apnea is present at all. education about REM BD and EDS, narcolepsy , cataplexy.  45 minutes.       PHYSICAL EXAM  Vitals:   11/03/15 1239  BP: 98/61  Pulse: (!) 56  Weight: 190 lb (86.2 kg)  Height: 5\' 6"  (1.676 m)   Body mass index is 30.67 kg/m.  MMSE - Mini Mental State Exam 11/03/2015  Orientation to time 4  Orientation to Place 5  Registration 3  Attention/ Calculation 5  Recall 2  Language- name 2 objects 2  Language- repeat 1  Language- follow 3 step command 3  Language- read & follow direction 1  Write a sentence 1  Copy design 1  Total score 28     Generalized: Well developed, in no acute distress   Neurological examination  Mentation: Alert oriented to time, place, history taking. Follows all commands speech and language fluent Cranial nerve II-XII: Pupils were equal round reactive to light. Extraocular movements were full, visual field were full on confrontational test. Facial sensation and strength were normal. Uvula tongue midline. Head turning and shoulder shrug  were normal and symmetric. Motor: The motor testing reveals 5 over 5 strength of all 4 extremities. Good symmetric motor tone is noted throughout.  Sensory: Sensory testing is intact to soft touch on all 4 extremities. No evidence of extinction is noted.  Coordination: Cerebellar testing reveals good finger-nose-finger and heel-to-shin bilaterally.  Gait and station: Gait is normal. Tandem gait is normal. Romberg is negative. No drift is seen.  Reflexes: Deep tendon reflexes are symmetric and normal bilaterally.   DIAGNOSTIC DATA (LABS, IMAGING, TESTING) - I reviewed patient records, labs, notes,  testing and imaging myself where available.   ASSESSMENT AND PLAN 60 y.o. year old male  has a past medical history of ASD (atrial septal defect); Atrial fibrillation ([place]); Brachial plexopathy; CAD (coronary artery disease); CHF (congestive heart failure) ([place]); Coronary artery disease; H/O maze procedure (05/17/08); Hyperlipidemia; Hypersomnia, organic (09/24/2012); Hypertension; OSA (obstructive sleep apnea); Overweight(278.02); Patent foramen ovale; Psoriatic arthritis ([place] Sleep apnea; Stroke [facility]); and Thrombocytopenia ([place]). here with:  1. Stroke 2. Cognitive slowing  Overall the patient is doing well. Continue on aspirin for stroke prevention. Fainting strict control of blood pressure with goal is less than 130/90, cholesterol LDL less than 70 and hemoglobin A1c less than 6.5%. The patient's memory score is stable. We will continue to monitor. Patient advised that he is doing well and could follow up with his primary care provider. However he states he would like to keep your appointments with [place] for now.    Ward Givens, MSN, NP-C 11/03/2015, 1:08 PM [facility] [address], [place], [place] [address] [phone]

## 2015-11-08 NOTE — Progress Notes (Signed)
I agree with the above plan 

## 2016-01-25 ENCOUNTER — Other Ambulatory Visit: Payer: Self-pay | Admitting: Cardiovascular Disease

## 2016-01-25 ENCOUNTER — Other Ambulatory Visit: Payer: Self-pay | Admitting: Family Medicine

## 2016-03-19 ENCOUNTER — Other Ambulatory Visit: Payer: Self-pay | Admitting: Family Medicine

## 2016-03-22 DIAGNOSIS — H5213 Myopia, bilateral: Secondary | ICD-10-CM | POA: Diagnosis not present

## 2016-03-29 ENCOUNTER — Other Ambulatory Visit: Payer: Self-pay | Admitting: Family Medicine

## 2016-03-30 ENCOUNTER — Encounter: Payer: Self-pay | Admitting: Family Medicine

## 2016-03-30 ENCOUNTER — Ambulatory Visit (INDEPENDENT_AMBULATORY_CARE_PROVIDER_SITE_OTHER): Payer: 59 | Admitting: Family Medicine

## 2016-03-30 VITALS — BP 108/62 | HR 69 | Temp 97.6°F | Resp 16 | Wt 198.0 lb

## 2016-03-30 DIAGNOSIS — Z23 Encounter for immunization: Secondary | ICD-10-CM | POA: Diagnosis not present

## 2016-03-30 DIAGNOSIS — F419 Anxiety disorder, unspecified: Secondary | ICD-10-CM | POA: Diagnosis not present

## 2016-03-30 DIAGNOSIS — Z125 Encounter for screening for malignant neoplasm of prostate: Secondary | ICD-10-CM | POA: Diagnosis not present

## 2016-03-30 MED ORDER — ALPRAZOLAM 0.5 MG PO TABS: 0.5000 mg | ORAL_TABLET | Freq: Every day | ORAL | 5 refills | Status: DC

## 2016-03-30 MED ORDER — LEXAPRO 20 MG PO TABS: 20.0000 mg | ORAL_TABLET | Freq: Every day | ORAL | 3 refills | Status: DC

## 2016-03-30 NOTE — Patient Instructions (Signed)
We will call you with the lab result. Consider colonoscopy.

## 2016-03-30 NOTE — Progress Notes (Signed)
Subjective:     Patient ID: Kevin [name], male   DOB: 1955-12-01, 60 y.o.   MRN: [id number]  HPI  Chief Complaint  Patient presents with  . Follow-up    Patient is here for a follow up. Patient has no concerns at this time. Patient is doing well on current medication regimen. He is requesting refills on Alprazolam  Reports compliance with Lexapro. Usually takes one Xanax at bedtime but occasionally two. Continues to be followed by [facility] neurology, [place] [facility] and cardiology, Dr. Rockey Situ.   Review of Systems  Gastrointestinal:       Has never had a colonoscopy and defers for now. Denies change in bowel pattern or blood in his stool.  Genitourinary:       Nocturia x one but first void of AM is slow to come out. No recent PSA. Does take antihistamines.       Objective:   Physical Exam  Constitutional: He appears well-developed and well-nourished. No distress.  Psychiatric: He has a normal mood and affect. His behavior is normal.       Assessment:    1. Need for influenza vaccination - Flu Vaccine QUAD 36+ mos PF IM (Fluarix & Fluzone Quad PF)  2. Anxiety - LEXAPRO 20 MG tablet; Take 1 tablet (20 mg total) by mouth daily.  Dispense: 90 tablet; Refill: 3 - ALPRAZolam (XANAX) 0.5 MG tablet; Take 1-2 tablets (0.5-1 mg total) by mouth at bedtime.  Dispense: 60 tablet; Refill: 5  3. Screening for prostate cancer - PSA    Plan:    Further f/u pending lab work. Encourage colonoscopy.

## 2016-04-10 ENCOUNTER — Telehealth: Payer: Self-pay | Admitting: Family Medicine

## 2016-04-10 NOTE — Telephone Encounter (Signed)
Coricidin for high bp, saline nasal spray, and Delsym for cough.

## 2016-04-10 NOTE — Telephone Encounter (Signed)
Pt advised and agrees with treatment plan. Emily Drozdowski, CMA  

## 2016-04-10 NOTE — Telephone Encounter (Signed)
Do you have any suggestions other than Coricidin? Renaldo Fiddler, CMA

## 2016-04-10 NOTE — Telephone Encounter (Signed)
Pt has a head cold and wants to know he can take with his blood pressure meds.  Please advise.  Pt call back is [phone]  Thanks teri

## 2016-04-16 ENCOUNTER — Other Ambulatory Visit: Payer: Self-pay | Admitting: Cardiovascular Disease

## 2016-04-20 ENCOUNTER — Telehealth: Payer: Self-pay | Admitting: Cardiovascular Disease

## 2016-04-20 ENCOUNTER — Other Ambulatory Visit: Payer: Self-pay

## 2016-04-20 MED ORDER — PRASUGREL HCL 10 MG PO TABS: 10.0000 mg | ORAL_TABLET | Freq: Every day | ORAL | 3 refills | Status: DC

## 2016-04-20 NOTE — Telephone Encounter (Signed)
Pt wife states pt Effient needs prior authorization

## 2016-04-20 NOTE — Telephone Encounter (Signed)
Pt wife called, states pt is completely out of Effient. Please call.

## 2016-04-20 NOTE — Telephone Encounter (Signed)
No samples available 

## 2016-04-23 ENCOUNTER — Other Ambulatory Visit: Payer: Self-pay

## 2016-04-23 MED ORDER — PRASUGREL HCL 10 MG PO TABS: 10.0000 mg | ORAL_TABLET | Freq: Every day | ORAL | 3 refills | Status: DC

## 2016-04-23 NOTE — Telephone Encounter (Signed)
Per Leafy Ro, RN generic Effient was sent in per Dr. Donivan Scull request.

## 2016-05-03 ENCOUNTER — Other Ambulatory Visit: Payer: Self-pay

## 2016-05-03 DIAGNOSIS — E785 Hyperlipidemia, unspecified: Secondary | ICD-10-CM

## 2016-05-04 ENCOUNTER — Ambulatory Visit: Payer: 59 | Admitting: Cardiovascular Disease

## 2016-05-07 ENCOUNTER — Other Ambulatory Visit (INDEPENDENT_AMBULATORY_CARE_PROVIDER_SITE_OTHER): Payer: 59

## 2016-05-07 DIAGNOSIS — Z125 Encounter for screening for malignant neoplasm of prostate: Secondary | ICD-10-CM | POA: Diagnosis not present

## 2016-05-07 DIAGNOSIS — E785 Hyperlipidemia, unspecified: Secondary | ICD-10-CM | POA: Diagnosis not present

## 2016-05-08 ENCOUNTER — Telehealth: Payer: Self-pay

## 2016-05-08 LAB — HEPATIC FUNCTION PANEL
ALK PHOS: 44 IU/L (ref 39–117)
ALT: 11 IU/L (ref 0–44)
AST: 16 IU/L (ref 0–40)
Albumin: 4.4 g/dL (ref 3.6–4.8)
BILIRUBIN, DIRECT: 0.13 mg/dL (ref 0.00–0.40)
Bilirubin Total: 0.4 mg/dL (ref 0.0–1.2)
Total Protein: 6.8 g/dL (ref 6.0–8.5)

## 2016-05-08 LAB — LIPID PANEL
CHOLESTEROL TOTAL: 149 mg/dL (ref 100–199)
Chol/HDL Ratio: 3.5 ratio units (ref 0.0–5.0)
HDL: 42 mg/dL (ref >39)
LDL Calculated: 81 mg/dL (ref 0–99)
Triglycerides: 130 mg/dL (ref 0–149)
VLDL Cholesterol Cal: 26 mg/dL (ref 5–40)

## 2016-05-08 LAB — PSA: PROSTATE SPECIFIC AG, SERUM: 0.3 ng/mL (ref 0.0–4.0)

## 2016-05-08 NOTE — Telephone Encounter (Signed)
Patient has been advised. KW 

## 2016-05-08 NOTE — Telephone Encounter (Signed)
-----   Message from Carmon Ginsberg, [place] sent at 05/08/2016 10:23 AM EST ----- normal

## 2016-05-15 ENCOUNTER — Encounter: Payer: Self-pay | Admitting: Cardiovascular Disease

## 2016-05-15 ENCOUNTER — Ambulatory Visit: Payer: 59 | Admitting: Cardiovascular Disease

## 2016-05-15 ENCOUNTER — Ambulatory Visit (INDEPENDENT_AMBULATORY_CARE_PROVIDER_SITE_OTHER): Payer: 59 | Admitting: Cardiovascular Disease

## 2016-05-15 VITALS — BP 112/70 | HR 55 | Resp 16 | Ht 66.0 in | Wt 192.0 lb

## 2016-05-15 DIAGNOSIS — I10 Essential (primary) hypertension: Secondary | ICD-10-CM | POA: Diagnosis not present

## 2016-05-15 DIAGNOSIS — Q2111 Secundum atrial septal defect: Secondary | ICD-10-CM

## 2016-05-15 DIAGNOSIS — Q211 Atrial septal defect: Secondary | ICD-10-CM

## 2016-05-15 DIAGNOSIS — I480 Paroxysmal atrial fibrillation: Secondary | ICD-10-CM

## 2016-05-15 DIAGNOSIS — I208 Other forms of angina pectoris: Secondary | ICD-10-CM

## 2016-05-15 DIAGNOSIS — I25118 Atherosclerotic heart disease of native coronary artery with other forms of angina pectoris: Secondary | ICD-10-CM | POA: Diagnosis not present

## 2016-05-15 DIAGNOSIS — E782 Mixed hyperlipidemia: Secondary | ICD-10-CM | POA: Diagnosis not present

## 2016-05-15 MED ORDER — EZETIMIBE 10 MG PO TABS: 10.0000 mg | ORAL_TABLET | Freq: Every day | ORAL | 3 refills | Status: DC

## 2016-05-15 MED ORDER — CLOPIDOGREL BISULFATE 75 MG PO TABS: 75.0000 mg | ORAL_TABLET | Freq: Every day | ORAL | 3 refills | Status: DC

## 2016-05-15 MED ORDER — NITROGLYCERIN 0.4 MG SL SUBL: 0.4000 mg | SUBLINGUAL_TABLET | SUBLINGUAL | 6 refills | Status: DC | PRN

## 2016-05-15 NOTE — Patient Instructions (Addendum)
Medication Instructions:   Please start zetia one a day for high LDL Please stop the effient Start plavix with aspirin 81 mg daily   Labwork:  No new labs needed  Testing/Procedures:  No further testing at this time   I recommend watching educational videos on topics of interest to you at:       [email or web address]  Enter code: [email or web address]    Follow-Up: It was a pleasure seeing you in the office today. Please call us if you have new issues that need to be addressed before your next appt.  [phone]  Your physician wants you to follow-up in: 6 months.  You will receive a reminder letter in the mail two months in advance. If you don't receive a letter, please call our office to schedule the follow-up appointment.  If you need a refill on your cardiac medications before your next appointment, please call your pharmacy.

## 2016-05-15 NOTE — Progress Notes (Signed)
Cardiology Office Note  Date:  05/15/2016   ID:  Kevin, [name] 1955/10/20, MRN [id number]  PCP:  Wilhemena Durie, MD   Chief Complaint  Patient presents with  . other    1 year f/u as well as discuss lipid,liver and psa results. Pt c/o chest tightness/sob at times. Reviewed meds with pt verbally.    HPI:  Mr. Gustave is a 61 y/o male with h/o CAD, atrial fib, HTN, hyperlipidemia, OSA and previous CVA,  CABG with Maze and PFO closure in February 2010,  cath in 10/2008  for exertional dyspnea and CP showing 2-V CAD. LAD 95% mid, LCX small RCA large OK.  SVG -> OM1 and OM2 was occluded. SVG-Diag was widely patent and backfilled the LAD well. LIMA - LAD was atretic. Myoview to assess LAD for ischemia with very mild reversible defect in very distal anterior wall and apex. Decision made to manage him medically, continued chest pain and cath Aug 9th 2011 with PCI of the LAD with 2 stents placed.  He presents today for follow-up of his coronary artery disease He has trouble remembering things following his stroke  In follow-up today  he presents with his wife he reports that he is been doing very well No exercise program, continues to work long hours, weight trending upwards Less CP perhaps, has not been using nitroglycerin He has poor memory, does not remember when he has chest pain symptoms  Recent lab work reviewed with him showing total cholesterol 149, LDL 80   EKG on today's visit shows normal sinus rhythm with rate 55 bpm, no significant ST or T-wave changes no change from previous EKGs  Stress test June 2016 showed no ischemia, normal ejection fraction.  Cardiac catheterization last week at the end of December 2014 showing no significant change in his disease, patent stents in the LAD.  He did have nonsustained VT during the case. Impressive run of ventricular arrhythmia. He is asymptomatic at the time. This was recorded on telemetry and presented to show the family. On further  discussion, it was uncertain if arrhythmia was causing his episodes of chest pain. 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  PSH:    Past Surgical History:  Procedure Laterality Date  . ACUTE PANCREATITIS  5/11  . APPENDECTOMY  1984  . CHOLECYSTECTOMY  08/19/2009  . CORONARY ARTERY BYPASS GRAFT  05/12/2008   x4 by PeterVan Trigt,MD  . PATENT FORAMEN OVALE CLOSURE    . STINTS  8/11   X2  . TONSILLECTOMY     AS A CHILD    Current Outpatient Prescriptions  Medication Sig Dispense Refill  . acetaminophen (TYLENOL) 500 MG tablet Take 500 mg by mouth as needed.      . ALPRAZolam (XANAX) 0.5 MG tablet Take 1-2 tablets (0.5-1 mg total) by mouth at bedtime. 60 tablet 5  . aspirin 81 MG tablet Take 1 tablet (81 mg total) by mouth daily.    . cetirizine (ZYRTEC) 10 MG tablet Take 10 mg by mouth daily.      . clopidogrel (PLAVIX) 75 MG tablet Take  1 tablet (75 mg total) by mouth daily. 90 tablet 3  . ezetimibe (ZETIA) 10 MG tablet Take 1 tablet (10 mg total) by mouth daily. 90 tablet 3  . isosorbide mononitrate (IMDUR) 30 MG 24 hr tablet TAKE ONE TABLET BY MOUTH DAILY 90 tablet 3  . LEXAPRO 20 MG tablet Take 1 tablet (20 mg total) by mouth daily. 90 tablet 3  . metoprolol (LOPRESSOR) 50 MG tablet TAKE ONE TABLET BY MOUTH 2 TIMES A DAY 60 tablet 3  . nitroGLYCERIN (NITROSTAT) 0.4 MG SL tablet Place 1 tablet (0.4 mg total) under the tongue every 5 (five) minutes as needed. May repeat for up to 3  doses. 25 tablet 6  . RANEXA 1000 MG SR tablet TAKE 1 TABLET BY MOUTH 2 TIMES DAILY 180 tablet 3  . ranitidine (ZANTAC) 300 MG tablet TAKE 1 TABLET BY MOUTH ONCE DAILY 90 tablet 3  . rosuvastatin (CRESTOR) 40 MG tablet TAKE 1 TABLET (40 MG TOTAL) BY MOUTH DAILY. 90 tablet 3   No current facility-administered medications for this visit.      Allergies:   Strawberry extract and Dilaudid [hydromorphone hcl]   Social History:  The patient  reports that he has never smoked. He has never used smokeless tobacco. He reports that he does not drink alcohol or use drugs.   Family History:   family history includes Cancer in his paternal uncle; Coronary artery disease in his other; Diabetes in his other, other, and sister; Heart attack in his father; Heart disease in his mother, paternal grandfather, and paternal grandmother; Hyperlipidemia in his other; Hypertension in his mother and other; Lung cancer in his paternal aunt; Ovarian cancer in his sister; Psychiatric Illness in his father.    Review of Systems: Review of Systems  Constitutional: Negative.   Respiratory: Negative.   Cardiovascular: Positive for chest pain.       Rare episodes, does not remember how often, intensity, timing  Gastrointestinal: Negative.   Musculoskeletal: Negative.   Neurological: Negative.   Psychiatric/Behavioral: Negative.   All other systems reviewed and are negative.    PHYSICAL EXAM: VS:  BP 112/70 (BP Location: Left Leg)   Pulse (!) 55   Resp 16   Ht 5\' 6"  (1.676 m)   Wt 192 lb (87.1 kg)   BMI 30.99 kg/m  , BMI Body mass index is 30.99 kg/m. GEN: Well nourished, well developed, in no acute distress  HEENT: normal  Neck: no JVD, carotid bruits, or masses Cardiac: RRR; no murmurs, rubs, or gallops,no edema  Respiratory:  clear to auscultation bilaterally, normal work of breathing GI: soft, nontender, nondistended, + BS MS: no deformity or atrophy  Skin: warm and dry, no rash Neuro:  Strength and  sensation are intact Psych: euthymic mood, full affect    Recent Labs: 05/07/2016: ALT 11    Lipid Panel Lab Results  Component Value Date   CHOL 149 05/07/2016   HDL 42 05/07/2016   LDLCALC 81 05/07/2016   TRIG 130 05/07/2016      Wt Readings from Last 3 Encounters:  05/15/16 192 lb (87.1 kg)  03/30/16 198 lb (89.8 kg)  11/03/15 190 lb (86.2 kg)       ASSESSMENT AND PLAN:  Coronary artery disease of native artery of native heart with stable angina pectoris (HCC) - Plan: EKG 12-Lead  Stable angina symptoms, discussed with him in detail no escalation, no further testing at this time Recommended he stop the Effient. Start Plavix with low-dose  aspirin He is on both for history of stroke   Essential hypertension - Plan: EKG 12-Lead Blood pressure is well controlled on today's visit. No changes made to the medications.  Paroxysmal atrial fibrillation (HCC) Previous maze procedure , denies any tachycardia palpitations concerning for arrhythmia  Mixed hyperlipidemia  PATENT FORAMEN OVALE  previous PFO closure   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HCC)  as above, symptoms stable, nitroglycerin renewed   Total encounter time more than 15 minutes  Greater than 50% was spent in counseling and coordination of care with the patient   Disposition:   F/U  6 months   Orders Placed This Encounter  Procedures  . EKG 12-Lead     Signed, Esmond Plants, [name], Ph.D. 05/15/2016  [place], [place]

## 2016-06-11 ENCOUNTER — Other Ambulatory Visit: Payer: Self-pay | Admitting: Cardiovascular Disease

## 2016-06-14 ENCOUNTER — Encounter: Payer: Self-pay | Admitting: Family Medicine

## 2016-06-14 ENCOUNTER — Ambulatory Visit (INDEPENDENT_AMBULATORY_CARE_PROVIDER_SITE_OTHER): Payer: 59 | Admitting: Family Medicine

## 2016-06-14 VITALS — BP 120/70 | HR 49 | Temp 97.6°F | Resp 15 | Wt 194.0 lb

## 2016-06-14 DIAGNOSIS — R1013 Epigastric pain: Secondary | ICD-10-CM | POA: Diagnosis not present

## 2016-06-14 NOTE — Progress Notes (Signed)
Subjective:     Patient ID: Kevin [name], male   DOB: Jun 18, 1955, 61 y.o.   MRN: [id number]  Abdominal Pain     Chief Complaint  Patient presents with  . Abdominal Pain    Patient comes into office today with concerns of upper abdominal pain for the past two days. Patient decribes pain as achy and intense, he states that abdomen is sore/sensitive to the touch. Patient has noticed that his abdominal area is sticking out more, he states that eating cracker seems to settle his discomfort. Patient denies fever, gas, constipation, diarrhea, nausea and vomiting.   States he was awakened three nights ago with 8/10 stomach area pain. This is now a constant "tightness" which is improved with intake of food. He has been eating lightly for the last few days-cereal/grilled cheese. Last bowel movement today (formed). Hx of diverticulosis, appendectomy, and cholecystectomy.Reports heartburn under control with ranitidine.   Review of Systems  Gastrointestinal: Positive for abdominal pain.       Objective:   Physical Exam  Constitutional: He appears well-developed and well-nourished. He does not have a sickly appearance. No distress.  Pulmonary/Chest: Breath sounds normal.  Abdominal: Soft. There is tenderness (mild in epigastric area). There is no guarding.       Assessment:    1. Epigastric pain: provided with Dexilant 60 mg. X 5 to take in AM  - Comprehensive metabolic panel - CBC with Differential/Platelet - Lipase - H. pylori breath test    Plan:    Continue to take ranitidine at bedtime pending lab work.

## 2016-06-14 NOTE — Patient Instructions (Addendum)
We will call you with the lab results. Take Dexilant in the morning and ranitidine at bedtime.

## 2016-06-15 ENCOUNTER — Telehealth: Payer: Self-pay

## 2016-06-15 NOTE — Telephone Encounter (Signed)
-----   Message from Carmon Ginsberg, [place] sent at 06/15/2016  9:51 AM EDT ----- Lipase-pancrease test is normal. Await breath test results.

## 2016-06-15 NOTE — Telephone Encounter (Signed)
Pt advised.   Thanks,   -Mirah Nevins  

## 2016-06-16 LAB — COMPREHENSIVE METABOLIC PANEL
A/G RATIO: 1.8 (ref 1.2–2.2)
ALBUMIN: 4.4 g/dL (ref 3.6–4.8)
ALT: 16 IU/L (ref 0–44)
AST: 22 IU/L (ref 0–40)
Alkaline Phosphatase: 41 IU/L (ref 39–117)
BILIRUBIN TOTAL: 0.6 mg/dL (ref 0.0–1.2)
BUN / CREAT RATIO: 14 (ref 10–24)
BUN: 18 mg/dL (ref 8–27)
CHLORIDE: 100 mmol/L (ref 96–106)
CO2: 26 mmol/L (ref 18–29)
Calcium: 9.4 mg/dL (ref 8.6–10.2)
Creatinine, Ser: 1.26 mg/dL (ref 0.76–1.27)
GFR, EST AFRICAN AMERICAN: 71 mL/min/{1.73_m2} (ref >59)
GFR, EST NON AFRICAN AMERICAN: 61 mL/min/{1.73_m2} (ref >59)
GLOBULIN, TOTAL: 2.5 g/dL (ref 1.5–4.5)
Glucose: 98 mg/dL (ref 65–99)
POTASSIUM: 4.9 mmol/L (ref 3.5–5.2)
SODIUM: 142 mmol/L (ref 134–144)
TOTAL PROTEIN: 6.9 g/dL (ref 6.0–8.5)

## 2016-06-16 LAB — CBC WITH DIFFERENTIAL/PLATELET
BASOS: 0 %
Basophils Absolute: 0 10*3/uL (ref 0.0–0.2)
EOS (ABSOLUTE): 0.4 10*3/uL (ref 0.0–0.4)
Eos: 6 %
Hematocrit: 39.6 % (ref 37.5–51.0)
Hemoglobin: 13.4 g/dL (ref 13.0–17.7)
Immature Grans (Abs): 0 10*3/uL (ref 0.0–0.1)
Immature Granulocytes: 0 %
LYMPHS ABS: 1.7 10*3/uL (ref 0.7–3.1)
Lymphs: 25 %
MCH: 32.6 pg (ref 26.6–33.0)
MCHC: 33.8 g/dL (ref 31.5–35.7)
MCV: 96 fL (ref 79–97)
MONOS ABS: 0.7 10*3/uL (ref 0.1–0.9)
Monocytes: 11 %
NEUTROS ABS: 4 10*3/uL (ref 1.4–7.0)
Neutrophils: 58 %
PLATELETS: 195 10*3/uL (ref 150–379)
RBC: 4.11 x10E6/uL — ABNORMAL LOW (ref 4.14–5.80)
RDW: 13.6 % (ref 12.3–15.4)
WBC: 6.9 10*3/uL (ref 3.4–10.8)

## 2016-06-16 LAB — LIPASE: LIPASE: 49 U/L (ref 13–78)

## 2016-06-16 LAB — H. PYLORI BREATH TEST: H. PYLORI UBIT: NEGATIVE

## 2016-06-18 ENCOUNTER — Telehealth: Payer: Self-pay

## 2016-06-18 NOTE — Telephone Encounter (Signed)
Patient advised as directed below. Per patient he wants Ch[place]vin to know he is doing much better.  Thanks,  -Joseline

## 2016-06-18 NOTE — Telephone Encounter (Signed)
-----   Message from Carmon Ginsberg, [place] sent at 06/18/2016  7:51 AM EDT ----- Breath test ok. Are you improving?

## 2016-06-20 ENCOUNTER — Telehealth: Payer: Self-pay | Admitting: Family Medicine

## 2016-06-20 NOTE — Telephone Encounter (Signed)
Please review and advise, wife was informed you are out of the office this afternoon. KW

## 2016-06-20 NOTE — Telephone Encounter (Signed)
Pt wife called stating pt has taken the samples of Dexliant and is asking if pt will need a Rx called in.  [facility].  CB#[phone]/MW

## 2016-06-21 NOTE — Telephone Encounter (Signed)
May take Prevacid (otc) 15 mg. Two pills daily for a total of two weeks (this is equivalent to [place])

## 2016-06-21 NOTE — Telephone Encounter (Signed)
Patient was advised as below. KW 

## 2016-06-25 ENCOUNTER — Telehealth: Payer: Self-pay | Admitting: Family Medicine

## 2016-06-25 NOTE — Telephone Encounter (Signed)
Pt's wife called saying they had recd a message saying he was supposed to be taking prevacid 2 times a day but there was no rx at the pharmacy.  Please advise.  She is confused as to whether it is otc or an actual rx  Her call back is [phone]  Thanks teri

## 2016-06-25 NOTE — Telephone Encounter (Signed)
Advised wife per message from Mikki Santee to take [place]

## 2016-07-25 ENCOUNTER — Other Ambulatory Visit: Payer: Self-pay | Admitting: Family Medicine

## 2016-07-25 MED ORDER — ESCITALOPRAM OXALATE 20 MG PO TABS: 20.0000 mg | ORAL_TABLET | Freq: Every day | ORAL | 3 refills | Status: DC

## 2016-08-01 ENCOUNTER — Telehealth: Payer: Self-pay | Admitting: Family Medicine

## 2016-08-14 ENCOUNTER — Other Ambulatory Visit: Payer: Self-pay | Admitting: Cardiovascular Disease

## 2016-09-03 ENCOUNTER — Other Ambulatory Visit: Payer: Self-pay | Admitting: Cardiovascular Disease

## 2016-09-28 DIAGNOSIS — L400 Psoriasis vulgaris: Secondary | ICD-10-CM | POA: Diagnosis not present

## 2016-09-28 DIAGNOSIS — X32XXXA Exposure to sunlight, initial encounter: Secondary | ICD-10-CM | POA: Diagnosis not present

## 2016-09-28 DIAGNOSIS — Z85828 Personal history of other malignant neoplasm of skin: Secondary | ICD-10-CM | POA: Diagnosis not present

## 2016-09-28 DIAGNOSIS — L821 Other seborrheic keratosis: Secondary | ICD-10-CM | POA: Diagnosis not present

## 2016-09-28 DIAGNOSIS — L570 Actinic keratosis: Secondary | ICD-10-CM | POA: Diagnosis not present

## 2016-09-28 DIAGNOSIS — L218 Other seborrheic dermatitis: Secondary | ICD-10-CM | POA: Diagnosis not present

## 2016-10-04 ENCOUNTER — Other Ambulatory Visit: Payer: Self-pay | Admitting: Family Medicine

## 2016-10-04 DIAGNOSIS — F419 Anxiety disorder, unspecified: Secondary | ICD-10-CM

## 2016-10-04 MED ORDER — ALPRAZOLAM 0.5 MG PO TABS: 0.5000 mg | ORAL_TABLET | Freq: Every day | ORAL | 5 refills | Status: DC

## 2016-10-04 NOTE — Progress Notes (Signed)
rx called in-aa 

## 2016-10-25 ENCOUNTER — Telehealth: Payer: Self-pay | Admitting: Neurology

## 2016-10-25 NOTE — Telephone Encounter (Signed)
Placed pt on the wait list in the event we have cancellations and we will attempt to get him in sooner

## 2016-11-08 ENCOUNTER — Ambulatory Visit: Payer: 59 | Admitting: Neurology

## 2016-11-14 ENCOUNTER — Encounter: Payer: Self-pay | Admitting: Neurology

## 2016-11-14 ENCOUNTER — Ambulatory Visit (INDEPENDENT_AMBULATORY_CARE_PROVIDER_SITE_OTHER): Payer: 59 | Admitting: Neurology

## 2016-11-14 VITALS — BP 103/64 | HR 58 | Ht 66.0 in | Wt 196.0 lb

## 2016-11-14 DIAGNOSIS — G3184 Mild cognitive impairment, so stated: Secondary | ICD-10-CM

## 2016-11-14 NOTE — Progress Notes (Signed)
PATIENT: Kevin [name] DOB: [birth year]  REASON FOR VISIT: follow up- cognitive slowing, stroke HISTORY FROM: patient  HISTORY OF PRESENT ILLNESS:  Kevin [name] is seen here for a yearly follow up-  Today 11/14/2016, his chief complaint in the past has been cognitive slowing rather than forgetfulness. His wife also feels that he has become more and more forgetful but he does very well on our standard office neuropsychology tests. He scored 30 out of 30 on a Mini-Mental Status Examination by Lillia Corporal, his [place] cognitive assessment test documented 28 out of 30 points. He cannot remember if he ate or spoke to a customer ,  difficulties with beginning and finishing projects- all beginning with his strokes in 2007.   Kevin [name] is a 61 year old male with a history of stroke and cognitive slowing. He returns today for follow-up. The patient feels that he has remained stable. He continues to notice trouble with his memory although he feels it is not any worse. 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 He remains on aspirin. His primary care and cardiologist managing his blood pressure and cholesterol. He is not diabetic. He does not smoke. He denies any new neurological symptoms. He returns today for an evaluation.  HISTORY Kevin [name]  Is a 61 year old male with a history of strokes and residual cognitive slowing. He returns today for follow-up. The patient feels that his cognition has remained the same. He does not feel that things have gotten worse. 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 He is followed by a cardiologist who manages his blood pressure and cholesterol. He denies any additional strokelike symptoms. He also states that his fatigue has improved over the years. He states that he has learned how to manage it better. The patient continues to take  aspirin as secondary stroke prevention.  Patient states that his short-term memory is most affected. However he states that he can normally recall things if prompted. He denies any new medical issues. He returns today for an evaluation.  HISTORY 09/24/13: Kevin [name] is a 61 y.o. male here as a follow up visit, after last year's referral for transition of care from Dr. Erling Cruz. HST in 2013 negative for OSA, Epworth score is 18, FSS 43 points, hypersomnia persistent. 2007 two strokes , in March affecting the left and in May 2007 affecting right hemisphere. He has typical right brain deficits based on his neuropsychological testing. 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 His spouse feels that his performance is limited and work routines are too Film/video editor for him. He needs to re check his own work, all this takes extra time, he cannot multitask.  Last visit note: Patient was originally referred for hypersomnia by Dr Love12-20-13, afterhe reported being noncompliant with CPAP. At the time, I ordered a home sleep test which returned as an AHI of less than 5 and basically would not qualify the patient for CPAP use or doesn't indicate the need of CPAP. Kevin [name] has a past medical history however him admitted and made understandable why apnea was considered for him to be a diagnosis he had a right brain stroke in May 2007, but it of coronary artery disease and atrial fibrillation in the past, hypertension, hyperlipidemia, psoriasis, obstructive  sleep apnea was diagnosed in 2012 and at the time he was titrated to 7 cm water pressure at this was done at [facility]. Again meanwhile his apnea index is less than 5 and therefore not longer in need of CPAP treatment. Would have been present isn't elevated or excessive daytime sleepiness with Epworth score is 17/24 at the time of his home sleep test as ordered by Dr. Erling Cruz, and also today he again and  [name] an Epworth sleepiness score of 13 point less than in last December but still slightly elevated.Goes to bed at 10:30 PM, falls asleep very quickly and arises in the morning at 7:30. He may have one or none bathroom break. And intermittent slowing only the patient does not snore himself awake.The patient reports that if he has a chance to take a nap he will and it'll take between 2-1/2 hours. He seems to be at his sleep he is between 11 AM and 2 was 3 PM. The patient also falls regularly asleep in front of the TV at night at about 6:30 PM . He states that he cannot stay awake until the news.I was able today to review all sleep study if the whole sleep test, the previous polysomnography from 11-24-10 and CPAP interpretation from 9-22,012. Dr. Morene Antu is the referring physician for all 3 studies. The patient was no able to lose weight , was 170 pounds in 2012 now 188 lbs.  His past medical history is summarized here in short:  Patient suffered a left brain stroke in March 2007 at age 5, another stroke in the right posterior cerebral artery on 08-21-2005. 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 He underwent a coronary artery disease 4 vessel bypass surgery and patch closure by Dr. Nils Pyle on 2-15 2010. At the time he had complained of intermittent diplopia as a residual from his stroke symptoms, he also had cognitive issues. A neuropsychological battery testing in September 2010 showed that his cognitive abilities were well in the average range and his executive function was intact. But he had deficits for complex visual attention, shifting attention and multitasking multiple stimuli applied simultaneously did result in confusion. His memory findings were typical of of right brain dysfunction . The patient is a Armed forces operational officer ( flooring) and noted that he becomes more fatigued and probably less attentive with  the ongoing day at work.  Toward the end of the day he would often develop a tinnitus which he feels distracts him . He feels that: Naps in the morning or early afternoon can help him to sustain productivity through the day, he feels less fatigued less distractible and more able to concentrate.  REVIEW OF SYSTEMS: Out of a complete 14 system review of symptoms, the patient complains only of the following symptoms, and all other reviewed systems are negative.  Subjective cognitive decline.    ALLERGIES: Allergies  Allergen Reactions  . Strawberry Extract Anaphylaxis       . Dilaudid [Hydromorphone Hcl]     Vomiting     HOME MEDICATIONS: Outpatient Medications Prior to Visit  Medication Sig Dispense Refill  . acetaminophen (TYLENOL) 500 MG tablet Take 500 mg by mouth as needed.      . ALPRAZolam (XANAX) 0.5 MG tablet Take 1-2 tablets (0.5-1 mg total) by mouth at bedtime. 60 tablet 5  . aspirin 81 MG tablet Take 1 tablet (81 mg total) by mouth  daily.    . cetirizine (ZYRTEC) 10 MG tablet Take 10 mg by mouth daily.      . clopidogrel (PLAVIX) 75 MG tablet Take 1 tablet (75 mg total) by mouth daily. 90 tablet 3  . escitalopram (LEXAPRO) 20 MG tablet Take 1 tablet (20 mg total) by mouth daily. 90 tablet 3  . isosorbide mononitrate (IMDUR) 30 MG 24 hr tablet TAKE ONE TABLET BY MOUTH DAILY 90 tablet 3  . metoprolol (LOPRESSOR) 50 MG tablet TAKE ONE TABLET BY MOUTH 2 TIMES A DAY 180 tablet 3  . nitroGLYCERIN (NITROSTAT) 0.4 MG SL tablet Place 1 tablet (0.4 mg total) under the tongue every 5 (five) minutes as needed. May repeat for up to 3 doses. 25 tablet 6  . RANEXA 1000 MG SR tablet TAKE 1 TABLET BY MOUTH 2 TIMES DAILY 180 tablet 3  . ranitidine (ZANTAC) 300 MG tablet TAKE 1 TABLET BY MOUTH ONCE DAILY 90 tablet 3  . rosuvastatin (CRESTOR) 40 MG tablet TAKE 1 TABLET (40 MG TOTAL) BY MOUTH DAILY. 90 tablet 3  . ezetimibe (ZETIA) 10 MG tablet Take 1 tablet (10 mg total) by mouth daily. 90  tablet 3   No facility-administered medications prior to visit.     PAST MEDICAL HISTORY: Past Medical History:  Diagnosis Date  . ASD (atrial septal defect)   . Atrial fibrillation ([place])    s/p Maze  . Brachial plexopathy   . CAD (coronary artery disease)    s/p CABG  . CHF (congestive heart failure) ([place])   . Coronary artery disease    status post two-vessel to bypass surgery  . H/O maze procedure 05/17/08   closure of ASD  . Hyperlipidemia   . 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   . Hypertension   . OSA (obstructive sleep apnea)   . Overweight(278.02)   . Patent foramen ovale    s/p closure  . Psoriatic arthritis ([place])   . Sleep apnea   . Stroke [facility])    right brain, 07/2005,left brain, 05/2005  . Thrombocytopenia ([place])     PAST SURGICAL HISTORY: Past Surgical History:  Procedure Laterality Date  . ACUTE PANCREATITIS  5/11  . APPENDECTOMY  1984  . CHOLECYSTECTOMY  08/19/2009  . CORONARY ARTERY BYPASS GRAFT  05/12/2008   x4 by PeterVan Trigt,MD  . PATENT FORAMEN OVALE CLOSURE    . STINTS  8/11   X2  . TONSILLECTOMY     AS A CHILD    FAMILY HISTORY: Family History  Problem Relation Age of Onset  . Diabetes Sister   . Ovarian cancer Sister   . Psychiatric Illness Father   . Heart attack Father   . Diabetes Other   . Hypertension Mother   . Heart disease Mother   . Lung cancer Paternal Aunt   . Coronary artery disease Other   . Diabetes Other   . Hypertension Other   . Hyperlipidemia Other   . Cancer Paternal Uncle   . Heart disease Paternal Grandmother   . Heart disease Paternal Grandfather     SOCIAL HISTORY: Social History   Social History  . Marital status: Married    Spouse name: [name]  . Number of children: 3  . Years of education: N/A   Occupational History  . Full time [employer]   Social History Main Topics  . Smoking status: Never Smoker  . Smokeless tobacco: Never Used  .  Alcohol use No  . Drug use: No  . Sexual activity: Not on file   Other Topics Concern  . 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 Went to high school. He has worked at his present job for 22 years. He has been married for 34 years. They have 3 children. 2 of his daughters live in [place]. He drinks less than two cups of caffeine per day. He does not use  tobacco or recreational drugs. He has rare alcohol intake. Lives with wife and daughter      OSA diagnosed at [facility] , had his  sleep studies there  reviewed them all in detail today he  has retrognathia, sinusitis,  rhinitis      His ESS remains very high  at 16 and FSS at 32 . His falls assessment tool score is 5.   nasonex, refitted mask, the recent  HST failed to document that  apnea is present at all. education about REM BD and EDS, narcolepsy , cataplexy.  45 minutes.     PHYSICAL EXAM  Vitals:   11/14/16 1532  BP: 103/64  Pulse: (!) 58  Weight: 196 lb (88.9 kg)  Height: 5\' 6"  (1.676 m)   Body mass index is 31.64 kg/m.  MMSE - Mini Mental State Exam 11/14/2016 11/03/2015  Orientation to time 5 4  Orientation to Place 5 5  Registration 3 3  Attention/ Calculation 5 5  Recall 3 2  Language- name 2 objects 2 2  Language- repeat 1 1  Language- follow 3 step command 3 3  Language- read & follow direction 1 1  Write a sentence 1 1  Copy design 1 1  Total score 30 28     Generalized: Well developed, in no acute distress   Neurological examination  Mentation: Alert oriented to time, place, history taking. Follows all commands speech and language fluent Cranial nerve: no loss of smell or taste. Pupils were equal round reactive to light. Uvula tongue midline. Head turning and shoulder shrug  were normal and symmetric. Motor: right sided tone elevated  Sensory: Sensory testing is intact to soft touch  on all 4 extremities. No evidence of extinction is noted.  Coordination: Cerebellar testing reveals good finger-nose-finger and heel-to-shin bilaterally.  Reflexes: Deep tendon reflexes are symmetric, brisk patella reflex- right over left .  DIAGNOSTIC DATA (LABS, IMAGING, TESTING) - I reviewed patient records, labs, notes, testing and imaging myself where available.   ASSESSMENT AND PLAN 61 y.o. year old male  has a past medical history of ASD (atrial septal defect); Atrial fibrillation ([place]); Brachial plexopathy; CAD (coronary artery disease); CHF (congestive heart failure) ([place]); Coronary artery disease; H/O maze procedure (05/17/08); Hyperlipidemia; Hypersomnia, organic (09/24/2012); Hypertension; OSA (obstructive sleep apnea); Overweight(278.02); Patent foramen ovale; Psoriatic arthritis ([place]); Sleep apnea; Stroke [facility]); and Thrombocytopenia ([place]). here as a stroke patient of Dr Bernardo Heater , later  Dr. Clydene Fake with:  1. Strokes in 2007, was  followed by Dr Erling Cruz.  2. Cognitive slowing-  Related to strokes in onset . He is stable. No progression.  3. Sleep - he is active , but not  enactment of dreams.  Right arm myoclonus, twitches, jerking.  He gained weight and apnea and snoring picked up. I declared him apnea free 4 years ago- he may have apnea now.    Overall the patient is doing well. Continue  on aspirin for stroke prevention.  Fainting: strict control of blood pressure with goal is less than 130/90, cholesterol LDL less than 70 and hemoglobin A1c less than 6.5%.  The patient's memory score is stable.  We will continue to monitor yearly- MOCA.   Larey Seat, MD    11/14/2016, 3:48 PM [facility] [address], [place], [place] [address] [phone]

## 2016-11-19 ENCOUNTER — Telehealth: Payer: Self-pay | Admitting: Cardiovascular Disease

## 2016-11-19 NOTE — Telephone Encounter (Signed)
Pt c/o of Chest Pain: STAT if CP now or developed within 24 hours  1. Are you having CP right now? no  2. Are you experiencing any other symptoms (ex. SOB, nausea, vomiting, sweating)? No, just pressure in chest.   3. How long have you been experiencing CP? Just lasted for 20-30 minutes  4. Is your CP continuous or coming and going? continual  5. Have you taken Nitroglycerin? no ?

## 2016-11-19 NOTE — Telephone Encounter (Signed)
Spoke w/ pt.  He reports that he is not sure if he overdid it yesterday or he may be dehydrated. He reports a tightness in his chest that feels like it is going thru to his back. He has been resting since this am and sx seem to be improving.  He denies actual chest pain, denies SOB. He reports some mild nausea but admits that he has only eaten 2 pieces of toast today and has been drinking wter in an effort to rehydrate. Advised him to try to eat something and to limit his free water so as not to potentially dilute his electrolytes if he is dehydrated. He reports that he felt like his heart may have been out of rhythm, but on checking, it was regular.  He has not checked his BP, but can walk across the street to have this checked.  Offered him appt tomorrow w/ Christell Faith, PA, which he readily accepts.  His wife is nurse in cardiac rehab and he states that she will feel better knowing that he has an appt.  Advised him to proceed to the ED if his sx become emergent and that he may call our # after hours and speak w/ a PA on call if he has sx overnight.  He is appreciative and will keep appt tomorrow.

## 2016-11-20 ENCOUNTER — Ambulatory Visit (INDEPENDENT_AMBULATORY_CARE_PROVIDER_SITE_OTHER): Payer: 59 | Admitting: Physician Assistant

## 2016-11-20 ENCOUNTER — Encounter: Payer: Self-pay | Admitting: Physician Assistant

## 2016-11-20 VITALS — BP 100/62 | HR 59 | Ht 66.0 in | Wt 196.5 lb

## 2016-11-20 DIAGNOSIS — I69319 Unspecified symptoms and signs involving cognitive functions following cerebral infarction: Secondary | ICD-10-CM

## 2016-11-20 DIAGNOSIS — Q211 Atrial septal defect: Secondary | ICD-10-CM

## 2016-11-20 DIAGNOSIS — E782 Mixed hyperlipidemia: Secondary | ICD-10-CM

## 2016-11-20 DIAGNOSIS — I25118 Atherosclerotic heart disease of native coronary artery with other forms of angina pectoris: Secondary | ICD-10-CM

## 2016-11-20 DIAGNOSIS — I200 Unstable angina: Secondary | ICD-10-CM | POA: Insufficient documentation

## 2016-11-20 DIAGNOSIS — I208 Other forms of angina pectoris: Secondary | ICD-10-CM

## 2016-11-20 DIAGNOSIS — I480 Paroxysmal atrial fibrillation: Secondary | ICD-10-CM | POA: Diagnosis not present

## 2016-11-20 DIAGNOSIS — Q2111 Secundum atrial septal defect: Secondary | ICD-10-CM

## 2016-11-20 DIAGNOSIS — I10 Essential (primary) hypertension: Secondary | ICD-10-CM

## 2016-11-20 MED ORDER — METOPROLOL TARTRATE 25 MG PO TABS: 25.0000 mg | ORAL_TABLET | Freq: Two times a day (BID) | ORAL | 3 refills | Status: DC

## 2016-11-20 NOTE — Patient Instructions (Addendum)
Medication Instructions:  Your physician has recommended you make the following change in your medication:  1. DECREASE Metoprolol to 25 mg twice a day   Labwork: Your physician recommends that you return for lab work. Go to [facility] Entrance and check in at the front desk. No appointment is needed for this and just take lab slips with you.    Testing/Procedures: [facility] Cardiac Cath Instructions   You are scheduled for a Cardiac Cath on:_Friday August 31, 2018__  Please arrive at _07:30_am on the day of your procedure  Please expect a call from our [place] to pre-register you  Do not eat/drink anything after midnight  Someone will need to drive you home  It is recommended someone be with you for the first 24 hours after your procedure  Wear clothes that are easy to get on/off and wear slip on shoes if possible   Medications bring a current list of all medications with you  _X__ You may take all of your medications the morning of your procedure with enough water to swallow safely    Day of your procedure: Arrive at the [place] entrance.  Free valet service is available.  After entering the [place] please check-in at the registration desk (1st desk on your right) to receive your armband. After receiving your armband someone will escort you to the cardiac cath/special procedures waiting area.  The usual length of stay after your procedure is about 2 to 3 hours.  This can vary.  If you have any questions, please call our office at [phone], or you may call the cardiac cath lab at [facility] directly at [phone]   Follow-Up: Your physician recommends that you keep scheduled follow-up appointment on 12/10/16 at 3:00 PM .   It was a pleasure seeing you today here in the office. Please do not hesitate to give [place] a call back if you have any further questions. [place], BSN

## 2016-11-20 NOTE — Progress Notes (Signed)
 Cardiology Office Note Date:  11/20/2016  Patient ID:  Kevin [name], DOB 01/26/1956, MRN [id number] PCP:  Chauvin, Robert, PA  Cardiologist:  Dr. Gollan, MD    Chief Complaint: Chest tightness  History of Present Illness: Kevin [name] is a 61 y.o. male with history of CAD s/p 4 vessel CABG with Maze procedure and PFO closure in 05/2008, PAF s/p Maze as above, NSVT noted during prior LHC, HTN, HLD, prior CVA, obesity and OSA who presents for evaluation of chest tightness.  Patient underwent 4-vessel CABG in 05/2008 with LIMA-LAD, SVG-Diag, sequential SVG-OM1/OM2. Follow up LHC in 10/2008 for exertional dyspnea and chest pain showed 2-vessel CAD with mid LAD 95% stenosed, nonocclusive disease in the LCx and RCA, SVG-OM1/OM2 was occluded. SVG-Diag was widely patent and backfilled the LAD well. LIMA-LAD was atretic. Follow up Myoview to assess for ischemia of the LAD showed very mild reversible defect in the very distal anterior wall and apex. Medical management was advised. He again underwent LHC in 10/2009 for chest pain with PCI x 2 to the LAD. LHC in 03/2013 showed no significant change in his disease when compared to prior with patent stents in the LAD. During LHC in 03/2013, he had NSVT, asymptomatic. Follow up cardiac monitoring in 04/2013 showed NSR without significant arrhythmia. Stress test in 09/2014 showed no ischemia with normal EF. He was seen in 05/2016 for follow up of lipid panel and PSA results. He noted occasional chest tightness and SOB, though was overall doing well. His Effient was stopped and he was started on Plavix with ASA.   Patient called the office on 8/20 with chest tightness after a busy day the day prior with associated palpitations. He was concerned he may have been dehydrated. Appointment was made for today.   Patient comes in with his wife today stating for the past several weeks he has "not felt like myself." They are both somewhat vague on clarification on this but  indicate he has been more fatigued and not as active as he has previously been. On 8/19, patient was outside working hard when he developed a substernal deep chest tightness. Pain has been mostly constant, though does wax and wane. Tightness is present at rest, and feels similar to prior PCI symptoms. No associated symptoms. He has not had to take any SL NTG. Currently without tightness.   Secondly, he has noted an occasional positional dizziness when standing quickly. He does not check his BP at home. He has not felt any symptoms similar to when he was in Afib previously.   Lipid panel from 05/2016 with LDL 81, HGB from 05/2016 of 13.4 (stable), SCr 1.26 from 05/2016, K+ 4.9 from 05/2016, normal liver function 05/2016.   Past Medical History:  Diagnosis Date  . ASD (atrial septal defect)   . Brachial plexopathy   . CAD (coronary artery disease)    s/p CABG  . Coronary artery disease    a. s/p 4 vessel CABG in 05/2008 w/ LIMA-LAD, SVG-Diag, sequential SVG-OM1/OM2; b. s/p PCI/DES x 2 to LAD in 10/2009; c. LHC 03/2013 stable disease and patent LAD stents  . H/O maze procedure 05/17/08   closure of ASD  . Hyperlipidemia   . 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   . Hypertension   . OSA (obstructive sleep apnea)   . Overweight(278.02)   . PAF (paroxysmal atrial fibrillation) (HCC)    a. s/p Maze 05/2008, previously   on Eliquis->discontinued 05/2016; c. CHADS2VASc => 4 (HTN, stroke x 2, vascular disease)  . Patent foramen ovale    s/p closure  . Psoriatic arthritis (HCC)   . Sleep apnea   . 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 Thrombocytopenia (HCC)     Past Surgical History:  Procedure Laterality Date  . ACUTE PANCREATITIS  5/11  . APPENDECTOMY  1984  . CHOLECYSTECTOMY  08/19/2009  . CORONARY ARTERY BYPASS GRAFT  05/12/2008   x4 by PeterVan Trigt,MD  . PATENT FORAMEN OVALE CLOSURE    . STINTS  8/11   X2  . TONSILLECTOMY     AS A  CHILD    Current Meds  Medication Sig  . acetaminophen (TYLENOL) 500 MG tablet Take 500 mg by mouth as needed.    . ALPRAZolam (XANAX) 0.5 MG tablet Take 1-2 tablets (0.5-1 mg total) by mouth at bedtime.  . aspirin 81 MG tablet Take 1 tablet (81 mg total) by mouth daily.  . cetirizine (ZYRTEC) 10 MG tablet Take 10 mg by mouth daily.    . clopidogrel (PLAVIX) 75 MG tablet Take 1 tablet (75 mg total) by mouth daily.  . escitalopram (LEXAPRO) 20 MG tablet Take 1 tablet (20 mg total) by mouth daily.  . ezetimibe (ZETIA) 10 MG tablet Take 1 tablet (10 mg total) by mouth daily.  . isosorbide mononitrate (IMDUR) 30 MG 24 hr tablet TAKE ONE TABLET BY MOUTH DAILY  . metoprolol (LOPRESSOR) 50 MG tablet TAKE ONE TABLET BY MOUTH 2 TIMES A DAY  . nitroGLYCERIN (NITROSTAT) 0.4 MG SL tablet Place 1 tablet (0.4 mg total) under the tongue every 5 (five) minutes as needed. May repeat for up to 3 doses.  . RANEXA 1000 MG SR tablet TAKE 1 TABLET BY MOUTH 2 TIMES DAILY  . ranitidine (ZANTAC) 300 MG tablet TAKE 1 TABLET BY MOUTH ONCE DAILY  . rosuvastatin (CRESTOR) 40 MG tablet TAKE 1 TABLET (40 MG TOTAL) BY MOUTH DAILY.    Allergies:   Strawberry extract and Dilaudid [hydromorphone hcl]   Social History:  The patient  reports that he has never smoked. He has never used smokeless tobacco. He reports that he does not drink alcohol or use drugs.   Family History:  The patient's family history includes Cancer in his paternal uncle; Coronary artery disease in his other; Diabetes in his other, other, and sister; Heart attack in his father; Heart disease in his mother, paternal grandfather, and paternal grandmother; Hyperlipidemia in his other; Hypertension in his mother and other; Lung cancer in his paternal aunt; Ovarian cancer in his sister; Psychiatric Illness in his father.  ROS:   Review of Systems  Constitutional: Positive for malaise/fatigue. Negative for chills, diaphoresis, fever and weight loss.  HENT:  Negative for congestion.   Eyes: Negative for discharge and redness.  Respiratory: Negative for cough, hemoptysis, sputum production, shortness of breath and wheezing.   Cardiovascular: Positive for chest pain. Negative for palpitations, orthopnea, claudication, leg swelling and PND.  Gastrointestinal: Negative for abdominal pain, blood in stool, heartburn, melena, nausea and vomiting.  Genitourinary: Negative for hematuria.  Musculoskeletal: Negative for falls and myalgias.  Skin: Negative for rash.  Neurological: Positive for weakness. Negative for dizziness, tingling, tremors, sensory change, speech change, focal weakness and loss of consciousness.  Endo/Heme/Allergies: Does not bruise/bleed easily.  Psychiatric/Behavioral: Negative for substance abuse. The patient is not nervous/anxious.   All other systems reviewed and are negative.    PHYSICAL   EXAM:  VS:  BP 100/62 (BP Location: Left Arm, Patient Position: Sitting, Cuff Size: Normal)   Pulse (!) 59   Ht 5' 6" (1.676 m)   Wt 196 lb 8 oz (89.1 kg)   BMI 31.72 kg/m  BMI: Body mass index is 31.72 kg/m.  Physical Exam  Constitutional: He is oriented to person, place, and time. He appears well-developed and well-nourished.  HENT:  Head: Normocephalic and atraumatic.  Eyes: Right eye exhibits no discharge. Left eye exhibits no discharge.  Neck: Normal range of motion. No JVD present.  Cardiovascular: Normal rate, regular rhythm, S1 normal, S2 normal and normal heart sounds.  Exam reveals no distant heart sounds, no friction rub, no midsystolic click and no opening snap.   No murmur heard. Pulmonary/Chest: Effort normal and breath sounds normal. No respiratory distress. He has no decreased breath sounds. He has no wheezes. He has no rales. He exhibits no tenderness.  Abdominal: Soft. He exhibits no distension. There is no tenderness.  Musculoskeletal: He exhibits no edema.  Neurological: He is alert and oriented to person, place, and  time.  Skin: Skin is warm and dry. No cyanosis. Nails show no clubbing.  Psychiatric: He has a normal mood and affect. His speech is normal and behavior is normal. Judgment and thought content normal.     EKG:  Was ordered and interpreted by me today. Shows sinus bradycardia, 59 bpm, nonspecific anterolateral st/t changes   Recent Labs: 06/14/2016: ALT 16; BUN 18; Creatinine, Ser 1.26; Hemoglobin 13.4; Platelets 195; Potassium 4.9; Sodium 142  05/07/2016: Chol/HDL Ratio 3.5; Cholesterol, Total 149; HDL 42; LDL Calculated 81; Triglycerides 130   CrCl cannot be calculated (Patient's most recent lab result is older than the maximum 21 days allowed.).   Wt Readings from Last 3 Encounters:  11/20/16 196 lb 8 oz (89.1 kg)  11/14/16 196 lb (88.9 kg)  06/14/16 194 lb (88 kg)     Other studies reviewed: Additional studies/records reviewed today include: summarized above  ASSESSMENT AND PLAN:  1. Chest tightness/CAD s/p CABG as above: Currently asymptomatic. Symptoms are similar to his prior PCI. He does have plans to travel out of the country in late September to go to [place] and [place] to visit with his children. Given his symptom presentation, PMH, and upcoming plans, he is likely best suited for LHC over stress testing. Both he and his wife seem to prefer LHC as well. Schedule for LHC. No medications to hold. Pre-cath labs and CXR. Risks and benefits of cardiac catheterization have been discussed with the patient including risks of bleeding, bruising, infection, kidney damage, stroke, heart attack, and death. The patient understands these risks and is willing to proceed with the procedure. All questions have been answered and concerns listened to. Continue Imdur and Ranexa. Lopressor decreased as below. Continue ASA and Plavix.   2. PAF s/p Maze procedure: Currently in sinus bradycarida at 59 bpm. Has not had any symptoms that have felt like his prior Afib. Not on long term, full-dose  anticoagulation since Maze procedure. Continue Lopressor at decreased dose given soft BP and bradycardia.   3. HTN: BP on the softer side today at 100 mmHg systolic. Decrease Lopressor to 25 mg bid. If BP continues to run soft or if he notes positional dizziness, may need to decrease Imdur as well.   4. PFO: Status post closure in 05/2008.   5. HLD: Continue Crestor and Zetia. Recheck lipid and liver function in winter of 2019.     6. Prior stroke: ASA and Plavix. Defer to primary cardiologist and neurology.   Disposition: F/u with Dr. Gollan, MD s/p LHC.   Current medicines are reviewed at length with the patient today.  The patient did not have any concerns regarding medicines.  Signed, Khloee Garza PA-C 11/20/2016 2:19 PM     [facility] [address] [place], [place] [address] [phone] 

## 2016-11-28 ENCOUNTER — Telehealth: Payer: Self-pay | Admitting: Physician Assistant

## 2016-11-28 ENCOUNTER — Other Ambulatory Visit
Admission: RE | Admit: 2016-11-28 | Discharge: 2016-11-28 | Disposition: A | Discharge status: Home / Self Care | Payer: 59 | Source: Ambulatory Visit | Attending: Physician Assistant | Admitting: Physician Assistant

## 2016-11-28 DIAGNOSIS — I208 Other forms of angina pectoris: Secondary | ICD-10-CM | POA: Diagnosis not present

## 2016-11-28 LAB — CBC WITH DIFFERENTIAL/PLATELET
BASOS ABS: 0.1 10*3/uL (ref 0–0.1)
BASOS PCT: 1 %
EOS PCT: 7 %
Eosinophils Absolute: 0.4 10*3/uL (ref 0–0.7)
HCT: 38.5 % — ABNORMAL LOW (ref 40.0–52.0)
Hemoglobin: 13.6 g/dL (ref 13.0–18.0)
Lymphocytes Relative: 22 %
Lymphs Abs: 1.3 10*3/uL (ref 1.0–3.6)
MCH: 33.4 pg (ref 26.0–34.0)
MCHC: 35.3 g/dL (ref 32.0–36.0)
MCV: 94.6 fL (ref 80.0–100.0)
MONO ABS: 0.6 10*3/uL (ref 0.2–1.0)
Monocytes Relative: 10 %
NEUTROS ABS: 3.5 10*3/uL (ref 1.4–6.5)
Neutrophils Relative %: 60 %
PLATELETS: 158 10*3/uL (ref 150–440)
RBC: 4.07 MIL/uL — AB (ref 4.40–5.90)
RDW: 13 % (ref 11.5–14.5)
WBC: 5.8 10*3/uL (ref 3.8–10.6)

## 2016-11-28 LAB — BASIC METABOLIC PANEL
ANION GAP: 5 (ref 5–15)
BUN: 18 mg/dL (ref 6–20)
CALCIUM: 9.1 mg/dL (ref 8.9–10.3)
CO2: 27 mmol/L (ref 22–32)
Chloride: 108 mmol/L (ref 101–111)
Creatinine, Ser: 1.11 mg/dL (ref 0.61–1.24)
Glucose, Bld: 115 mg/dL — ABNORMAL HIGH (ref 65–99)
POTASSIUM: 4.6 mmol/L (ref 3.5–5.1)
Sodium: 140 mmol/L (ref 135–145)

## 2016-11-28 LAB — PROTIME-INR
INR: 1.02
PROTHROMBIN TIME: 13.3 s (ref 11.4–15.2)

## 2016-11-28 NOTE — Telephone Encounter (Signed)
I don't see cath orders. Can you ask Thurmond Butts to place orders.

## 2016-11-28 NOTE — Telephone Encounter (Signed)
Spoke with scheduling and we were able to schedule left heart cath for tomorrow at 08:30AM at [facility]. Confirmed appointment with patient and he was very appreciative for the earlier procedure date. He is aware of location, time, and all instructions for procedure tomorrow. He had no further questions or concerns at this time.

## 2016-11-28 NOTE — Telephone Encounter (Signed)
Request sent to PA for orders to be placed.

## 2016-11-28 NOTE — Telephone Encounter (Signed)
Spoke with patient to let him know that we need to reschedule his left heart cath for 12/10/16 at 09:30 with arrival time of 08:30AM. Patient needs it done sooner due to upcoming travel to [place]. Will see if there are other options at [facility]. Patient was appreciative for the call and will call him back once I find out if scheduling is possible.

## 2016-11-29 ENCOUNTER — Other Ambulatory Visit: Payer: Self-pay | Admitting: Physician Assistant

## 2016-11-29 ENCOUNTER — Ambulatory Visit
Admission: RE | Admit: 2016-11-29 | Discharge: 2016-11-29 | Disposition: A | Discharge status: Home / Self Care | Payer: 59 | Source: Ambulatory Visit | Attending: Cardiovascular Disease | Admitting: Cardiovascular Disease

## 2016-11-29 ENCOUNTER — Encounter
Admission: RE | Disposition: A | Discharge status: Home / Self Care | Payer: Self-pay | Source: Ambulatory Visit | Attending: Cardiovascular Disease

## 2016-11-29 ENCOUNTER — Encounter: Payer: Self-pay | Admitting: *Deleted

## 2016-11-29 DIAGNOSIS — Z7902 Long term (current) use of antithrombotics/antiplatelets: Secondary | ICD-10-CM | POA: Insufficient documentation

## 2016-11-29 DIAGNOSIS — I2510 Atherosclerotic heart disease of native coronary artery without angina pectoris: Secondary | ICD-10-CM | POA: Insufficient documentation

## 2016-11-29 DIAGNOSIS — R0789 Other chest pain: Secondary | ICD-10-CM | POA: Diagnosis not present

## 2016-11-29 DIAGNOSIS — I200 Unstable angina: Secondary | ICD-10-CM

## 2016-11-29 DIAGNOSIS — E669 Obesity, unspecified: Secondary | ICD-10-CM | POA: Diagnosis not present

## 2016-11-29 DIAGNOSIS — I25709 Atherosclerosis of coronary artery bypass graft(s), unspecified, with unspecified angina pectoris: Secondary | ICD-10-CM

## 2016-11-29 DIAGNOSIS — I480 Paroxysmal atrial fibrillation: Secondary | ICD-10-CM | POA: Insufficient documentation

## 2016-11-29 DIAGNOSIS — Z951 Presence of aortocoronary bypass graft: Secondary | ICD-10-CM | POA: Insufficient documentation

## 2016-11-29 DIAGNOSIS — Z791 Long term (current) use of non-steroidal anti-inflammatories (NSAID): Secondary | ICD-10-CM | POA: Insufficient documentation

## 2016-11-29 DIAGNOSIS — Z79899 Other long term (current) drug therapy: Secondary | ICD-10-CM | POA: Diagnosis not present

## 2016-11-29 DIAGNOSIS — G4733 Obstructive sleep apnea (adult) (pediatric): Secondary | ICD-10-CM | POA: Diagnosis not present

## 2016-11-29 DIAGNOSIS — I10 Essential (primary) hypertension: Secondary | ICD-10-CM | POA: Insufficient documentation

## 2016-11-29 DIAGNOSIS — Z8673 Personal history of transient ischemic attack (TIA), and cerebral infarction without residual deficits: Secondary | ICD-10-CM | POA: Insufficient documentation

## 2016-11-29 DIAGNOSIS — E785 Hyperlipidemia, unspecified: Secondary | ICD-10-CM | POA: Insufficient documentation

## 2016-11-29 HISTORY — PX: LEFT HEART CATH AND CORONARY ANGIOGRAPHY: CATH118249

## 2016-11-29 SURGERY — LEFT HEART CATH AND CORONARY ANGIOGRAPHY
Anesthesia: Moderate Sedation

## 2016-11-29 MED ORDER — HEPARIN SODIUM (PORCINE) 1000 UNIT/ML IJ SOLN
INTRAMUSCULAR | Status: AC
  Filled 2016-11-29: qty 1

## 2016-11-29 MED ORDER — SODIUM CHLORIDE 0.9 % IV SOLN: 250.0000 mL | INTRAVENOUS | Status: DC | PRN

## 2016-11-29 MED ORDER — SODIUM CHLORIDE 0.9 % IV SOLN: INTRAVENOUS | Status: DC

## 2016-11-29 MED ORDER — VERAPAMIL HCL 2.5 MG/ML IV SOLN
INTRAVENOUS | Status: AC
  Filled 2016-11-29: qty 2

## 2016-11-29 MED ORDER — IOPAMIDOL (ISOVUE-300) INJECTION 61%
INTRAVENOUS | Status: DC | PRN
  Administered 2016-11-29: 70 mL via INTRA_ARTERIAL

## 2016-11-29 MED ORDER — FENTANYL CITRATE (PF) 100 MCG/2ML IJ SOLN
INTRAMUSCULAR | Status: AC
  Filled 2016-11-29: qty 2

## 2016-11-29 MED ORDER — MIDAZOLAM HCL 2 MG/2ML IJ SOLN
INTRAMUSCULAR | Status: DC | PRN
  Administered 2016-11-29: 1 mg via INTRAVENOUS

## 2016-11-29 MED ORDER — ASPIRIN 81 MG PO CHEW: 81.0000 mg | CHEWABLE_TABLET | ORAL | Status: DC

## 2016-11-29 MED ORDER — SODIUM CHLORIDE 0.9% FLUSH: 3.0000 mL | INTRAVENOUS | Status: DC | PRN

## 2016-11-29 MED ORDER — MIDAZOLAM HCL 2 MG/2ML IJ SOLN
INTRAMUSCULAR | Status: AC
  Filled 2016-11-29: qty 2

## 2016-11-29 MED ORDER — SODIUM CHLORIDE 0.9% FLUSH: 3.0000 mL | Freq: Two times a day (BID) | INTRAVENOUS | Status: DC

## 2016-11-29 MED ORDER — VERAPAMIL HCL 2.5 MG/ML IV SOLN
INTRAVENOUS | Status: DC | PRN
  Administered 2016-11-29: 2.5 mg via INTRA_ARTERIAL

## 2016-11-29 MED ORDER — SODIUM CHLORIDE 0.9 % WEIGHT BASED INFUSION
3.0000 mL/kg/h | INTRAVENOUS | Status: AC
  Administered 2016-11-29: 3 mL/kg/h via INTRAVENOUS

## 2016-11-29 MED ORDER — LIDOCAINE HCL (PF) 1 % IJ SOLN
INTRAMUSCULAR | Status: AC
  Filled 2016-11-29: qty 30

## 2016-11-29 MED ORDER — HEPARIN SODIUM (PORCINE) 1000 UNIT/ML IJ SOLN
INTRAMUSCULAR | Status: DC | PRN
  Administered 2016-11-29: 4500 [IU] via INTRAVENOUS

## 2016-11-29 MED ORDER — SODIUM CHLORIDE 0.9 % WEIGHT BASED INFUSION: 1.0000 mL/kg/h | INTRAVENOUS | Status: DC

## 2016-11-29 MED ORDER — HEPARIN (PORCINE) IN NACL 2-0.9 UNIT/ML-% IJ SOLN
INTRAMUSCULAR | Status: AC
  Filled 2016-11-29: qty 1000

## 2016-11-29 SURGICAL SUPPLY — 6 items
CATH 5F 110X4 TIG (CATHETERS) ×1 IMPLANT
DEVICE RAD TR BAND REGULAR (VASCULAR PRODUCTS) ×1 IMPLANT
GLIDESHEATH SLEND SS 6F .021 (SHEATH) ×1 IMPLANT
KIT MANI 3VAL PERCEP (MISCELLANEOUS) ×2 IMPLANT
PACK CARDIAC CATH (CUSTOM PROCEDURE TRAY) ×2 IMPLANT
WIRE ROSEN-J .035X260CM (WIRE) ×1 IMPLANT

## 2016-11-29 NOTE — Addendum Note (Signed)
Addended by: Rise Mu on: 11/29/2016 07:07 AM   Modules accepted: Miquel Martin Smeal

## 2016-11-29 NOTE — Interval H&P Note (Signed)
History and Physical Interval Note:  11/29/2016 8:51 AM  Kevin [name]  has presented today for surgery, with the diagnosis of LT Cath   Chest pain  The various methods of treatment have been discussed with the patient and family. After consideration of risks, benefits and other options for treatment, the patient has consented to  Procedure(s): LEFT HEART CATH AND CORONARY ANGIOGRAPHY (N/A) as a surgical intervention .  The patient's history has been reviewed, patient examined, no change in status, stable for surgery.  I have reviewed the patient's chart and labs.  Questions were answered to the patient's satisfaction.     Kathlyn Sacramento

## 2016-11-29 NOTE — Telephone Encounter (Signed)
Orders placed.

## 2016-11-29 NOTE — H&P (View-Only) (Signed)
Cardiology Office Note Date:  11/20/2016  Patient ID:  Kevin [name], Kevin [name] Mar 03, 1956, MRN [id number] PCP:  Carmon Ginsberg, [place]  Cardiologist:  Dr. Rockey Situ, MD    Chief Complaint: Chest tightness  History of Present Illness: Kevin [name] is a 61 y.o. male with history of CAD s/p 4 vessel CABG with Maze procedure and PFO closure in 05/2008, PAF s/p Maze as above, NSVT noted during prior LHC, HTN, HLD, prior CVA, obesity and OSA who presents for evaluation of chest tightness.  Patient underwent 4-vessel CABG in 05/2008 with LIMA-LAD, SVG-Diag, sequential SVG-OM1/OM2. Follow up [place] in 10/2008 for exertional dyspnea and chest pain showed 2-vessel CAD with mid LAD 95% stenosed, nonocclusive disease in the LCx and RCA, SVG-OM1/OM2 was occluded. SVG-Diag was widely patent and backfilled the LAD well. LIMA-LAD was atretic. Follow up Myoview to assess for ischemia of the LAD showed very mild reversible defect in the very distal anterior wall and apex. Medical management was advised. He again underwent LHC in 10/2009 for chest pain with PCI x 2 to the LAD. LHC in 03/2013 showed no significant change in his disease when compared to prior with patent stents in the LAD. During [place] in 03/2013, he had NSVT, asymptomatic. Follow up cardiac monitoring in 04/2013 showed NSR without significant arrhythmia. Stress test in 09/2014 showed no ischemia with normal EF. He was seen in 05/2016 for follow up of lipid panel and PSA results. He noted occasional chest tightness and SOB, though was overall doing well. His Effient was stopped and he was started on Plavix with ASA.   Patient called the office on 8/20 with chest tightness after a busy day the day prior with associated palpitations. He was concerned he may have been dehydrated. Appointment was made for today.   Patient comes in with his wife today stating for the past several weeks he has "not felt like myself." They are both somewhat vague on clarification on this but  indicate he has been more fatigued and not as active as he has previously been. On 8/19, patient was outside working hard when he developed a substernal deep chest tightness. Pain has been mostly constant, though does wax and wane. Tightness is present at rest, and feels similar to prior PCI symptoms. No associated symptoms. He has not had to take any SL NTG. Currently without tightness.   Secondly, he has noted an occasional positional dizziness when standing quickly. He does not check his BP at home. He has not felt any symptoms similar to when he was in Afib previously.   Lipid panel from 05/2016 with LDL 81, HGB from 05/2016 of 13.4 (stable), SCr 1.26 from 05/2016, K+ 4.9 from 05/2016, normal liver function 05/2016.   Past Medical History:  Diagnosis Date  . ASD (atrial septal defect)   . Brachial plexopathy   . CAD (coronary artery disease)    s/p CABG  . Coronary artery disease    a. s/p 4 vessel CABG in 05/2008 w/ LIMA-LAD, SVG-Diag, sequential SVG-OM1/OM2; b. s/p PCI/DES x 2 to LAD in 10/2009; c. LHC 03/2013 stable disease and patent LAD stents  . H/O maze procedure 05/17/08   closure of ASD  . Hyperlipidemia   . 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   . Hypertension   . OSA (obstructive sleep apnea)   . Overweight(278.02)   . PAF (paroxysmal atrial fibrillation) ([place])    a. s/p Maze 05/2008, previously  on Eliquis->discontinued 05/2016; c. CHADS2VASc => 4 (HTN, stroke x 2, vascular disease)  . Patent foramen ovale    s/p closure  . Psoriatic arthritis ([place])   . Sleep apnea   . Stroke [facility])    right brain, 07/2005,left brain, 05/2005  . Thrombocytopenia ([place])     Past Surgical History:  Procedure Laterality Date  . ACUTE PANCREATITIS  5/11  . APPENDECTOMY  1984  . CHOLECYSTECTOMY  08/19/2009  . CORONARY ARTERY BYPASS GRAFT  05/12/2008   x4 by PeterVan Trigt,MD  . PATENT FORAMEN OVALE CLOSURE    . STINTS  8/11   X2  . TONSILLECTOMY     AS A  CHILD    Current Meds  Medication Sig  . acetaminophen (TYLENOL) 500 MG tablet Take 500 mg by mouth as needed.    . ALPRAZolam (XANAX) 0.5 MG tablet Take 1-2 tablets (0.5-1 mg total) by mouth at bedtime.  Marland Kitchen aspirin 81 MG tablet Take 1 tablet (81 mg total) by mouth daily.  . cetirizine (ZYRTEC) 10 MG tablet Take 10 mg by mouth daily.    . clopidogrel (PLAVIX) 75 MG tablet Take 1 tablet (75 mg total) by mouth daily.  Marland Kitchen escitalopram (LEXAPRO) 20 MG tablet Take 1 tablet (20 mg total) by mouth daily.  Marland Kitchen ezetimibe (ZETIA) 10 MG tablet Take 1 tablet (10 mg total) by mouth daily.  . isosorbide mononitrate (IMDUR) 30 MG 24 hr tablet TAKE ONE TABLET BY MOUTH DAILY  . metoprolol (LOPRESSOR) 50 MG tablet TAKE ONE TABLET BY MOUTH 2 TIMES A DAY  . nitroGLYCERIN (NITROSTAT) 0.4 MG SL tablet Place 1 tablet (0.4 mg total) under the tongue every 5 (five) minutes as needed. May repeat for up to 3 doses.  Marland Kitchen RANEXA 1000 MG SR tablet TAKE 1 TABLET BY MOUTH 2 TIMES DAILY  . ranitidine (ZANTAC) 300 MG tablet TAKE 1 TABLET BY MOUTH ONCE DAILY  . rosuvastatin (CRESTOR) 40 MG tablet TAKE 1 TABLET (40 MG TOTAL) BY MOUTH DAILY.    Allergies:   Strawberry extract and Dilaudid [hydromorphone hcl]   Social History:  The patient  reports that he has never smoked. He has never used smokeless tobacco. He reports that he does not drink alcohol or use drugs.   Family History:  The patient's family history includes Cancer in his paternal uncle; Coronary artery disease in his other; Diabetes in his other, other, and sister; Heart attack in his father; Heart disease in his mother, paternal grandfather, and paternal grandmother; Hyperlipidemia in his other; Hypertension in his mother and other; Lung cancer in his paternal aunt; Ovarian cancer in his sister; Psychiatric Illness in his father.  ROS:   Review of Systems  Constitutional: Positive for malaise/fatigue. Negative for chills, diaphoresis, fever and weight loss.  HENT:  Negative for congestion.   Eyes: Negative for discharge and redness.  Respiratory: Negative for cough, hemoptysis, sputum production, shortness of breath and wheezing.   Cardiovascular: Positive for chest pain. Negative for palpitations, orthopnea, claudication, leg swelling and PND.  Gastrointestinal: Negative for abdominal pain, blood in stool, heartburn, melena, nausea and vomiting.  Genitourinary: Negative for hematuria.  Musculoskeletal: Negative for falls and myalgias.  Skin: Negative for rash.  Neurological: Positive for weakness. Negative for dizziness, tingling, tremors, sensory change, speech change, focal weakness and loss of consciousness.  Endo/Heme/Allergies: Does not bruise/bleed easily.  Psychiatric/Behavioral: Negative for substance abuse. The patient is not nervous/anxious.   All other systems reviewed and are negative.    PHYSICAL  EXAM:  VS:  BP 100/62 (BP Location: Left Arm, Patient Position: Sitting, Cuff Size: Normal)   Pulse (!) 59   Ht 5\' 6"  (1.676 m)   Wt 196 lb 8 oz (89.1 kg)   BMI 31.72 kg/m  BMI: Body mass index is 31.72 kg/m.  Physical Exam  Constitutional: He is oriented to person, place, and time. He appears well-developed and well-nourished.  HENT:  Head: Normocephalic and atraumatic.  Eyes: Right eye exhibits no discharge. Left eye exhibits no discharge.  Neck: Normal range of motion. No JVD present.  Cardiovascular: Normal rate, regular rhythm, S1 normal, S2 normal and normal heart sounds.  Exam reveals no distant heart sounds, no friction rub, no midsystolic click and no opening snap.   No murmur heard. Pulmonary/Chest: Effort normal and breath sounds normal. No respiratory distress. He has no decreased breath sounds. He has no wheezes. He has no rales. He exhibits no tenderness.  Abdominal: Soft. He exhibits no distension. There is no tenderness.  Musculoskeletal: He exhibits no edema.  Neurological: He is alert and oriented to person, place, and  time.  Skin: Skin is warm and dry. No cyanosis. Nails show no clubbing.  Psychiatric: He has a normal mood and affect. His speech is normal and behavior is normal. Judgment and thought content normal.     EKG:  Was ordered and interpreted by me today. Shows sinus bradycardia, 59 bpm, nonspecific anterolateral st/t changes   Recent Labs: 06/14/2016: ALT 16; BUN 18; Creatinine, Ser 1.26; Hemoglobin 13.4; Platelets 195; Potassium 4.9; Sodium 142  05/07/2016: Chol/HDL Ratio 3.5; Cholesterol, Total 149; HDL 42; LDL Calculated 81; Triglycerides 130   CrCl cannot be calculated (Patient's most recent lab result is older than the maximum 21 days allowed.).   Wt Readings from Last 3 Encounters:  11/20/16 196 lb 8 oz (89.1 kg)  11/14/16 196 lb (88.9 kg)  06/14/16 194 lb (88 kg)     Other studies reviewed: Additional studies/records reviewed today include: summarized above  ASSESSMENT AND PLAN:  1. Chest tightness/CAD s/p CABG as above: Currently asymptomatic. Symptoms are similar to his prior PCI. He does have plans to travel out of the country in late September to go to [place] and [place] to visit with his children. Given his symptom presentation, PMH, and upcoming plans, he is likely best suited for LHC over stress testing. Both he and his wife seem to prefer LHC as well. Schedule for LHC. No medications to hold. Pre-cath labs and CXR. Risks and benefits of cardiac catheterization have been discussed with the patient including risks of bleeding, bruising, infection, kidney damage, stroke, heart attack, and death. The patient understands these risks and is willing to proceed with the procedure. All questions have been answered and concerns listened to. Continue Imdur and Ranexa. Lopressor decreased as below. Continue ASA and Plavix.   2. PAF s/p Maze procedure: Currently in sinus bradycarida at 59 bpm. Has not had any symptoms that have felt like his prior Afib. Not on long term, full-dose  anticoagulation since Maze procedure. Continue Lopressor at decreased dose given soft BP and bradycardia.   3. HTN: BP on the softer side today at 456 mmHg systolic. Decrease Lopressor to 25 mg bid. If BP continues to run soft or if he notes positional dizziness, may need to decrease Imdur as well.   4. PFO: Status post closure in 05/2008.   5. HLD: Continue Crestor and Zetia. Recheck lipid and liver function in winter of 2019.  6. Prior stroke: ASA and Plavix. Defer to primary cardiologist and neurology.   Disposition: F/u with Dr. Rockey Situ, MD s/p LHC.   Current medicines are reviewed at length with the patient today.  The patient did not have any concerns regarding medicines.  Melvern Banker PA-C 11/20/2016 2:19 PM     [address] [place], [place] [address] [phone]

## 2016-12-09 NOTE — Progress Notes (Signed)
Cardiology Office Note  Date:  12/10/2016   ID:  Kevin [name], Kevin [name] [birth year], MRN [id number]  PCP:  Kevin [name], Kevin [name]   Chief Complaint  Patient presents with  . other    6 month follow up. Meds reviewed by the pt. verbally. "doing well."    HPI:  Kevin [name] is a 61 y/o male with h/o  CVA, memory problems CAD,  atrial fib,  HTN,  hyperlipidemia,  OSA and previous CVA,   CABG with Maze and PFO closure in February 2010,   cath in 10/2008  for exertional dyspnea and CP showing 2-V CAD.  LAD 95% mid, LCX small RCA large OK.  SVG -> OM1 and OM2 was occluded. SVG-Diag was widely patent and backfilled the LAD well. LIMA - LAD was atretic. Myoview to assess LAD for ischemia with very mild reversible defect in very distal anterior wall and apex. Decision made to manage him medically, continued chest pain and cath Aug 9th 2011 with PCI of the LAD with 2 stents placed.  Cath 11/29/16 for chest pain, medical management He presents today for follow-up of his coronary artery disease  Recent cardiac catheterization performed by Kevin [name] for stuttering chest pain Cath reviewed Significant underlying one-vessel coronary artery disease with patent SVG to second diagonal, patent LAD stent with mild in-stent restenosis and known occluded SVG to OM. Native RCA and left circumflex don't have obstructive disease. Normal LV systolic function and mildly elevated left ventricular end-diastolic pressure.  In follow-up today  he presents with his wife he reports that he is been doing very well No exercise program, continues to work long hours,  Does not use nitroglycerin Chroniclow-grade memory issue  Recent lab work reviewed with him showing total cholesterol 149, LDL 80 Tolerating statin with Zetia   EKG on today's visit shows normal sinus rhythm with rate 65 bpm, nonspecific T wave abnormality, incomplete left bundle branch block  Other past medical history reviewed Stress test June 2016 showed  no ischemia, normal ejection fraction.  Cardiac catheterization last week at the end of December 2014 showing no significant change in his disease, patent stents in the LAD.  He did have nonsustained VT during the case. Impressive run of ventricular arrhythmia. He is asymptomatic at the time. This was recorded on telemetry and presented to show the family. On further discussion, it was uncertain if arrhythmia was causing his episodes of chest pain. 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  PSH:    Past Surgical History:  Procedure Laterality Date  . ACUTE PANCREATITIS  5/11  . APPENDECTOMY  1984  . CHOLECYSTECTOMY  08/19/2009  . CORONARY ARTERY BYPASS GRAFT  05/12/2008   x4 by Kevin Trigt,MD  . LEFT HEART CATH AND CORONARY ANGIOGRAPHY N/A 11/29/2016   Procedure: LEFT HEART CATH AND CORONARY ANGIOGRAPHY;  Surgeon: Kevin Hampshire, MD;  Location: [facility];  Service: Cardiovascular;  Laterality: N/A;  . PATENT FORAMEN OVALE CLOSURE    . STINTS  8/11   X2  . TONSILLECTOMY     AS A CHILD    Current Outpatient Prescriptions  Medication  Sig Dispense Refill  . acetaminophen (TYLENOL) 500 MG tablet Take 500-1,000 mg by mouth every 6 (six) hours as needed (for pain.).     Marland Kitchen ALPRAZolam (XANAX) 0.5 MG tablet Take 1-2 tablets (0.5-1 mg total) by mouth at bedtime. (Patient taking differently: Take 0.5 mg by mouth at bedtime. ) 60 tablet 5  . aspirin EC 81 MG tablet Take 81 mg by mouth daily.    .  cetirizine (ZYRTEC) 10 MG tablet Take 10 mg by mouth at bedtime.     . clobetasol ointment (TEMOVATE) 1.28 % Apply 1 application topically 2 (two) times daily as needed (for psoriasis).    . clopidogrel (PLAVIX) 75 MG tablet Take 1 tablet (75 mg total) by mouth daily. 90 tablet 3  . desonide (DESOWEN) 0.05 % cream Apply 1 application topically 2 (two) times daily as needed (for psoriasis).    Marland Kitchen escitalopram (LEXAPRO) 20 MG tablet Take 1 tablet (20 mg total) by mouth daily. (Patient taking differently: Take 20 mg by mouth at bedtime. ) 90 tablet 3  . ezetimibe (ZETIA) 10 MG tablet Take 1 tablet (10 mg total) by mouth daily. (Patient taking differently: Take 10 mg by mouth at bedtime. ) 90 tablet 3  . isosorbide mononitrate (IMDUR) 30 MG 24 hr tablet TAKE ONE TABLET BY MOUTH DAILY (Patient taking differently: TAKE ONE TABLET BY MOUTH DAILY AT BEDTIME) 90 tablet 3  . metoprolol tartrate (LOPRESSOR) 25 MG tablet Take 1 tablet (25 mg total) by mouth 2 (two) times daily. 180 tablet 3  . nitroGLYCERIN (NITROSTAT) 0.4 MG SL tablet Place 1 tablet (0.4 mg total) under the tongue every 5 (five) minutes as needed. May repeat for up to 3 doses. 25 tablet 6  . RANEXA 1000 MG SR tablet TAKE 1 TABLET BY MOUTH 2 TIMES DAILY 180 tablet 3  . ranitidine (ZANTAC) 300 MG tablet TAKE 1 TABLET BY MOUTH ONCE DAILY (Patient taking differently: TAKE 1 TABLET BY MOUTH ONCE DAILY AT BEDTIME) 90 tablet 3  . rosuvastatin (CRESTOR) 40 MG tablet TAKE 1 TABLET (40 MG TOTAL) BY MOUTH DAILY. (Patient taking differently: TAKE 1 TABLET (40 MG TOTAL) BY MOUTH DAILY AT BEDTIME.) 90 tablet 3   No current facility-administered medications for this visit.      Allergies:   Strawberry extract and Dilaudid [hydromorphone hcl]   Social History:  The patient  reports that he has never smoked. He has never used smokeless tobacco. He reports that he does not drink alcohol or use drugs.   Family History:   family history includes Cancer in his  paternal uncle; Coronary artery disease in his other; Diabetes in his other, other, and sister; Heart attack in his father; Heart disease in his mother, paternal grandfather, and paternal grandmother; Hyperlipidemia in his other; Hypertension in his mother and other; Lung cancer in his paternal aunt; Ovarian cancer in his sister; Psychiatric Illness in his father.    Review of Systems: Review of Systems  Constitutional: Negative.   Respiratory: Negative.   Cardiovascular: Negative.        Rare episodes, does not remember how often, intensity, timing  Gastrointestinal: Negative.   Musculoskeletal: Negative.   Neurological: Negative.   Psychiatric/Behavioral: Negative.   All other systems reviewed and are negative.    PHYSICAL EXAM: VS:  BP 108/62 (BP Location: Left Arm, Patient Position: Sitting, Cuff Size: Normal)   Pulse 65   Ht 5\' 6"  (1.676 m)   Wt 199 lb (90.3 kg)   BMI  32.12 kg/m  , BMI Body mass index is 32.12 kg/m. GEN: Well nourished, well developed, in no acute distress  HEENT: normal  Neck: no JVD, carotid bruits, or masses Cardiac: RRR; no murmurs, rubs, or gallops,no edema  Respiratory:  clear to auscultation bilaterally, normal work of breathing GI: soft, nontender, nondistended, + BS MS: no deformity or atrophy  Skin: warm and dry, no rash Neuro:  Strength and sensation are intact Psych: euthymic mood, full affect    Recent Labs: 06/14/2016: ALT 16 11/28/2016: BUN 18; Creatinine, Ser 1.11; Hemoglobin 13.6; Platelets 158; Potassium 4.6; Sodium 140    Lipid Panel Lab Results  Component Value Date   CHOL 149 05/07/2016   HDL 42 05/07/2016   LDLCALC 81 05/07/2016   TRIG 130 05/07/2016      Wt Readings from Last 3 Encounters:  12/10/16 199 lb (90.3 kg)  11/20/16 196 lb 8 oz (89.1 kg)  11/14/16 196 lb (88.9 kg)       ASSESSMENT AND PLAN:  Coronary artery disease of native artery of native heart with stable angina pectoris ([place]) - Plan: EKG  12-Lead Currently with no symptoms of angina. No further workup at this time. Continue current medication regimen.recent cardiac catheterization results discussed with him   Essential hypertension - Plan: EKG 12-Lead Blood pressure is well controlled on today's visit. No changes made to the medications.  Paroxysmal atrial fibrillation (HCC) Previous maze procedure , denies any tachycardia palpitations concerning for arrhythmia   Hyperlipidemia Cholesterol is at goal on the current lipid regimen. No changes to the medications were made.  PATENT FORAMEN OVALE  previous PFO closure   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HCC)  as above, symptoms stable, nitroglycerin renewed Cardiac catheterization results reviewed, detailed above   Total encounter time more than 25 minutes  Greater than 50% was spent in counseling and coordination of care with the patient   Disposition:   F/U  12 months   Orders Placed This Encounter  Procedures  . EKG 12-Lead     Signed, Esmond Plants, [name], Ph.D. 12/10/2016  [place] [place] [address], [place]

## 2016-12-10 ENCOUNTER — Encounter: Payer: Self-pay | Admitting: Cardiovascular Disease

## 2016-12-10 ENCOUNTER — Ambulatory Visit (INDEPENDENT_AMBULATORY_CARE_PROVIDER_SITE_OTHER): Payer: 59 | Admitting: Cardiovascular Disease

## 2016-12-10 VITALS — BP 108/62 | HR 65 | Ht 66.0 in | Wt 199.0 lb

## 2016-12-10 DIAGNOSIS — I639 Cerebral infarction, unspecified: Secondary | ICD-10-CM

## 2016-12-10 DIAGNOSIS — G4733 Obstructive sleep apnea (adult) (pediatric): Secondary | ICD-10-CM | POA: Diagnosis not present

## 2016-12-10 DIAGNOSIS — I25708 Atherosclerosis of coronary artery bypass graft(s), unspecified, with other forms of angina pectoris: Secondary | ICD-10-CM | POA: Diagnosis not present

## 2016-12-10 DIAGNOSIS — I480 Paroxysmal atrial fibrillation: Secondary | ICD-10-CM | POA: Diagnosis not present

## 2016-12-10 DIAGNOSIS — E782 Mixed hyperlipidemia: Secondary | ICD-10-CM | POA: Diagnosis not present

## 2016-12-10 NOTE — Patient Instructions (Signed)

## 2016-12-11 ENCOUNTER — Other Ambulatory Visit: Payer: Self-pay | Admitting: Cardiovascular Disease

## 2017-01-24 ENCOUNTER — Ambulatory Visit: Payer: 59 | Admitting: Neurology

## 2017-01-30 ENCOUNTER — Ambulatory Visit: Payer: 59 | Admitting: Neurology

## 2017-03-06 DIAGNOSIS — H524 Presbyopia: Secondary | ICD-10-CM | POA: Diagnosis not present

## 2017-03-20 ENCOUNTER — Telehealth: Payer: Self-pay | Admitting: Family Medicine

## 2017-03-20 NOTE — Telephone Encounter (Signed)
Pt wife states pt has to have the name brand for the Rx escitalopram (LEXAPRO) 20 MG tablet  Pt wife states a prior Josem Kaufmann was completed by Mikki Santee recently but the [place] called the office and was given permission by someone here to refill Rx for generic.  Pt wife states the generic for escitalopram (LEXAPRO) 20 MG tablet does not work and pt has to have name brand.  Pt wife states we will need to call [phone] and ask for a reconsideration for the prior auth so pt can get the name brand.  Pt only has a week of medication right now.  CB#[phone]/MW

## 2017-03-21 ENCOUNTER — Other Ambulatory Visit: Payer: Self-pay | Admitting: Family Medicine

## 2017-03-21 ENCOUNTER — Encounter: Payer: Self-pay | Admitting: Family Medicine

## 2017-03-21 ENCOUNTER — Ambulatory Visit (INDEPENDENT_AMBULATORY_CARE_PROVIDER_SITE_OTHER): Payer: 59 | Admitting: Family Medicine

## 2017-03-21 VITALS — BP 108/74 | HR 87 | Temp 98.1°F | Resp 16 | Wt 190.6 lb

## 2017-03-21 DIAGNOSIS — K529 Noninfective gastroenteritis and colitis, unspecified: Secondary | ICD-10-CM

## 2017-03-21 MED ORDER — ESCITALOPRAM OXALATE 20 MG PO TABS: 20.0000 mg | ORAL_TABLET | Freq: Every day | ORAL | 3 refills | Status: DC

## 2017-03-21 NOTE — Patient Instructions (Signed)
Discussed use of imodium and sips of Gatorade every 5 minutes. We will work on the [employer] authorization today.

## 2017-03-21 NOTE — Telephone Encounter (Signed)
Since medication was approved for generic, Im not sure if another pre-authorization needs to be done once you send in name brand for prescription. If you would reorder name brand if approved by you and I can restart pre-authorization again if needed. KW

## 2017-03-21 NOTE — Telephone Encounter (Signed)
I reordered it but call that 800 number and see if they will do it over the phone.

## 2017-03-21 NOTE — Telephone Encounter (Signed)
done

## 2017-03-21 NOTE — Progress Notes (Signed)
Subjective:     Patient ID: Kevin [name], male   DOB: 1955-04-04, 61 y.o.   MRN: [id number] Chief Complaint  Patient presents with  . Abdominal Pain    Patient comes in office today with complaints of abdominal pain and diarrhea that began last night. patient states that he went out to eat last night and when he returned home he had unbearable pain in his abdomen that took his breath away, patient reports diarrhea and headache that began today. Patient states that he has tried otc Tylenol.   HPI States he had a very stressful day at work prior to the onset of his sx. Reports explosive diarrhea at onset with further episodes throughout the night. Mild nausea but no vomiting. Has had prior appendectomy and cholecystectomy but no screening colonoscopy. He is accompanied by his wife today.  Review of Systems     Objective:   Physical Exam  Constitutional: He appears well-developed and well-nourished. No distress.  Pulmonary/Chest: Breath sounds normal.  Abdominal: Bowel sounds are normal. There is no tenderness. There is no guarding.       Assessment:    1. Gastroenteritis     Plan:   Discussed use of imodium and Gatorade sips every 5 minutes. Suggested there may also be a stress component to his sx.

## 2017-03-21 NOTE — Telephone Encounter (Signed)
Pharmacy representative states that new need to refax pre-authorization stating that patient can not be on generic and his failed trial and outcome on generic and to write down approval by PCP for brand name only. Patient is being seen in office this afternoon to address. KW

## 2017-03-24 ENCOUNTER — Ambulatory Visit
Admission: EM | Admit: 2017-03-24 | Discharge: 2017-03-24 | Disposition: A | Discharge status: Other Acute Inpatient Hospital | Payer: 59 | Attending: Family Medicine | Admitting: Family Medicine

## 2017-03-24 ENCOUNTER — Emergency Department (HOSPITAL_COMMUNITY)
Admission: EM | Admit: 2017-03-24 | Discharge: 2017-03-24 | Disposition: A | Discharge status: Home / Self Care | Payer: 59 | Attending: Emergency Medicine | Admitting: Emergency Medicine

## 2017-03-24 ENCOUNTER — Emergency Department (HOSPITAL_COMMUNITY): Payer: 59

## 2017-03-24 ENCOUNTER — Encounter (HOSPITAL_COMMUNITY): Payer: Self-pay | Admitting: *Deleted

## 2017-03-24 ENCOUNTER — Other Ambulatory Visit: Payer: Self-pay

## 2017-03-24 DIAGNOSIS — E785 Hyperlipidemia, unspecified: Secondary | ICD-10-CM | POA: Diagnosis not present

## 2017-03-24 DIAGNOSIS — R109 Unspecified abdominal pain: Secondary | ICD-10-CM

## 2017-03-24 DIAGNOSIS — I2510 Atherosclerotic heart disease of native coronary artery without angina pectoris: Secondary | ICD-10-CM | POA: Diagnosis not present

## 2017-03-24 DIAGNOSIS — I4891 Unspecified atrial fibrillation: Secondary | ICD-10-CM | POA: Diagnosis not present

## 2017-03-24 DIAGNOSIS — R197 Diarrhea, unspecified: Secondary | ICD-10-CM | POA: Diagnosis not present

## 2017-03-24 DIAGNOSIS — R1013 Epigastric pain: Secondary | ICD-10-CM | POA: Diagnosis not present

## 2017-03-24 DIAGNOSIS — Z79899 Other long term (current) drug therapy: Secondary | ICD-10-CM | POA: Insufficient documentation

## 2017-03-24 DIAGNOSIS — R9431 Abnormal electrocardiogram [ECG] [EKG]: Secondary | ICD-10-CM | POA: Diagnosis not present

## 2017-03-24 DIAGNOSIS — K5792 Diverticulitis of intestine, part unspecified, without perforation or abscess without bleeding: Secondary | ICD-10-CM | POA: Diagnosis not present

## 2017-03-24 DIAGNOSIS — I10 Essential (primary) hypertension: Secondary | ICD-10-CM | POA: Insufficient documentation

## 2017-03-24 DIAGNOSIS — K559 Vascular disorder of intestine, unspecified: Secondary | ICD-10-CM | POA: Diagnosis not present

## 2017-03-24 DIAGNOSIS — Z7901 Long term (current) use of anticoagulants: Secondary | ICD-10-CM | POA: Diagnosis not present

## 2017-03-24 DIAGNOSIS — R112 Nausea with vomiting, unspecified: Secondary | ICD-10-CM | POA: Diagnosis not present

## 2017-03-24 DIAGNOSIS — K5732 Diverticulitis of large intestine without perforation or abscess without bleeding: Secondary | ICD-10-CM | POA: Diagnosis not present

## 2017-03-24 DIAGNOSIS — Z7982 Long term (current) use of aspirin: Secondary | ICD-10-CM | POA: Diagnosis not present

## 2017-03-24 LAB — COMPREHENSIVE METABOLIC PANEL
ALT: 23 U/L (ref 17–63)
AST: 25 U/L (ref 15–41)
Albumin: 4.1 g/dL (ref 3.5–5.0)
Alkaline Phosphatase: 64 U/L (ref 38–126)
Anion gap: 9 (ref 5–15)
BILIRUBIN TOTAL: 0.9 mg/dL (ref 0.3–1.2)
BUN: 21 mg/dL — AB (ref 6–20)
CO2: 26 mmol/L (ref 22–32)
CREATININE: 1.22 mg/dL (ref 0.61–1.24)
Calcium: 8.6 mg/dL — ABNORMAL LOW (ref 8.9–10.3)
Chloride: 102 mmol/L (ref 101–111)
GFR calc Af Amer: >60 mL/min (ref >60)
Glucose, Bld: 96 mg/dL (ref 65–99)
Potassium: 4.1 mmol/L (ref 3.5–5.1)
Sodium: 137 mmol/L (ref 135–145)
TOTAL PROTEIN: 7.5 g/dL (ref 6.5–8.1)

## 2017-03-24 LAB — CBC WITH DIFFERENTIAL/PLATELET
BASOS ABS: 0 10*3/uL (ref 0–0.1)
Basophils Relative: 1 %
Eosinophils Absolute: 0.4 10*3/uL (ref 0–0.7)
Eosinophils Relative: 5 %
HEMATOCRIT: 38.9 % — AB (ref 40.0–52.0)
Hemoglobin: 13.6 g/dL (ref 13.0–18.0)
LYMPHS ABS: 1.6 10*3/uL (ref 1.0–3.6)
LYMPHS PCT: 18 %
MCH: 32.4 pg (ref 26.0–34.0)
MCHC: 35 g/dL (ref 32.0–36.0)
MCV: 92.7 fL (ref 80.0–100.0)
MONO ABS: 1.2 10*3/uL — AB (ref 0.2–1.0)
MONOS PCT: 14 %
NEUTROS ABS: 5.7 10*3/uL (ref 1.4–6.5)
Neutrophils Relative %: 62 %
Platelets: 179 10*3/uL (ref 150–440)
RBC: 4.2 MIL/uL — ABNORMAL LOW (ref 4.40–5.90)
RDW: 12.7 % (ref 11.5–14.5)
WBC: 9.1 10*3/uL (ref 3.8–10.6)

## 2017-03-24 LAB — TROPONIN I: Troponin I: <0.03 ng/mL (ref <0.03)

## 2017-03-24 LAB — LACTIC ACID, PLASMA: Lactic Acid, Venous: 1 mmol/L (ref 0.5–1.9)

## 2017-03-24 LAB — I-STAT CG4 LACTIC ACID, ED: LACTIC ACID, VENOUS: 0.98 mmol/L (ref 0.5–1.9)

## 2017-03-24 LAB — LIPASE, BLOOD: LIPASE: 25 U/L (ref 11–51)

## 2017-03-24 MED ORDER — CIPROFLOXACIN IN D5W 400 MG/200ML IV SOLN
400.0000 mg | Freq: Once | INTRAVENOUS | Status: AC
  Administered 2017-03-24: 400 mg via INTRAVENOUS
  Filled 2017-03-24: qty 200

## 2017-03-24 MED ORDER — IOPAMIDOL (ISOVUE-300) INJECTION 61%
INTRAVENOUS | Status: AC
  Administered 2017-03-24: 100 mL
  Filled 2017-03-24: qty 100

## 2017-03-24 MED ORDER — CIPROFLOXACIN HCL 500 MG PO TABS: 500.0000 mg | ORAL_TABLET | Freq: Two times a day (BID) | ORAL | 0 refills | Status: AC

## 2017-03-24 MED ORDER — METRONIDAZOLE 500 MG PO TABS: 500.0000 mg | ORAL_TABLET | Freq: Two times a day (BID) | ORAL | 0 refills | Status: AC

## 2017-03-24 MED ORDER — SODIUM CHLORIDE 0.9 % IV BOLUS (SEPSIS)
1000.0000 mL | Freq: Once | INTRAVENOUS | Status: AC
  Administered 2017-03-24: 1000 mL via INTRAVENOUS

## 2017-03-24 MED ORDER — METRONIDAZOLE IN NACL 5-0.79 MG/ML-% IV SOLN
500.0000 mg | Freq: Once | INTRAVENOUS | Status: AC
  Administered 2017-03-24: 500 mg via INTRAVENOUS
  Filled 2017-03-24: qty 100

## 2017-03-24 NOTE — ED Provider Notes (Signed)
[facility]    CSN: [id number] Arrival date & time: 03/24/17  1329  History   Chief Complaint Chief Complaint  Patient presents with  . Abdominal Pain   HPI  61 year old male with an extensive past medical history including coronary artery disease status post CABG, history of A. fib status post Maze procedure, hypertension, hyper lipidemia, history of CVA x2 presents with severe abdominal pain.  Patient reports that on Wednesday he had sudden onset severe abdominal pain and associated diarrhea.  Diarrhea has now resolved.  He saw his primary care physician on 12/20 and was told that this is likely viral.  Since then he is continued to have intermittent bouts of severe abdominal pain.  Occurs at night.  Located in the periumbilical region and epigastric region.  Severe.  He states that it is the worst pain he is ever had.  He states that it is even worse than prior bouts of gallbladder disease as well as pancreatitis.  He is currently having normal bowel movements.  Appetite is good.  He is eaten some food today without difficulty.  He has bouts of abdominal pain seem to occur at night.  He reports associated diaphoresis.  His wife states that he is doubled over in pain.  No other associated symptoms.  No other complaints or concerns at this time.  Past Medical History:  Diagnosis Date  . ASD (atrial septal defect)   . Brachial plexopathy   . CAD (coronary artery disease)    s/p CABG  . Coronary artery disease    a. s/p 4 vessel CABG in 05/2008 w/ LIMA-LAD, SVG-Diag, sequential SVG-OM1/OM2; b. s/p PCI/DES x 2 to LAD in 10/2009; c. LHC 03/2013 stable disease and patent LAD stents  . H/O maze procedure 05/17/08   closure of ASD  . Hyperlipidemia   . 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   . Hypertension   . OSA (obstructive sleep apnea)   . Overweight(278.02)   . PAF (paroxysmal atrial fibrillation) ([place])    a. s/p Maze 05/2008, previously  on Eliquis->discontinued 05/2016; c. CHADS2VASc => 4 (HTN, stroke x 2, vascular disease)  . Patent foramen ovale    s/p closure  . Psoriatic arthritis ([place])   . Sleep apnea   . Stroke [facility])    right brain, 07/2005,left brain, 05/2005  . Thrombocytopenia [facility])     Patient Active Problem List   Diagnosis Date Noted  . Unstable angina ([place]) 11/20/2016  . MCI (mild cognitive impairment) 11/14/2016  . Diverticulosis of colon 06/14/2016  . Obstructive sleep apnea 09/22/2015  . BP (high blood pressure) 09/22/2015  . Allergic rhinitis 09/22/2015  . Anxiety 09/22/2015  . 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[place]) 05/14/2014  . 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 NSVT (nonsustained ventricular tachycardia) ([place]) 04/07/2013  . 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 Memory problem 02/12/2012  . Cerebrovascular accident (CVA) ([place]) 06/21/2010  . Atherosclerosis of coronary artery bypass graft of native heart with stable angina pectoris ([place]) 05/13/2009  . Hyperlipidemia 08/11/2008  . Obesity 08/11/2008  . THROMBOCYTOPENIA 08/11/2008  . Coronary atherosclerosis 08/11/2008  . ATRIAL FIBRILLATION 08/11/2008  . PATENT FORAMEN OVALE 08/11/2008    Past Surgical History:  Procedure Laterality Date  . ACUTE PANCREATITIS  5/11  . APPENDECTOMY  1984  . CHOLECYSTECTOMY  08/19/2009  . CORONARY ARTERY BYPASS GRAFT  05/12/2008   x4 by PeterVan Trigt,MD  . LEFT HEART CATH AND CORONARY ANGIOGRAPHY N/A  11/29/2016   Procedure: LEFT HEART CATH AND CORONARY ANGIOGRAPHY;  Surgeon: Wellington Hampshire, MD;  Location: [facility];  Service: Cardiovascular;  Laterality: N/A;  . PATENT FORAMEN OVALE CLOSURE    . STINTS  8/11   X2  . TONSILLECTOMY     AS A CHILD    Home Medications    Prior to Admission medications   Medication Sig Start Date End Date Taking? Authorizing Provider  acetaminophen (TYLENOL) 500 MG tablet Take 500-1,000 mg by mouth every 6 (six) hours as needed (for pain.).     [provider]    ALPRAZolam Duanne Moron) 0.5 MG tablet Take 1-2 tablets (0.5-1 mg total) by mouth at bedtime. Patient taking differently: Take 0.5 mg by mouth at bedtime.  10/04/16   Carmon Ginsberg, PA  aspirin EC 81 MG tablet Take 81 mg by mouth daily.    [provider]  cetirizine (ZYRTEC) 10 MG tablet Take 10 mg by mouth at bedtime.     [provider]  clobetasol ointment (TEMOVATE) 7.98 % Apply 1 application topically 2 (two) times daily as needed (for psoriasis).    [provider]  clopidogrel (PLAVIX) 75 MG tablet Take 1 tablet (75 mg total) by mouth daily. 05/15/16   Minna Merritts, MD  desonide (DESOWEN) 0.05 % cream Apply 1 application topically 2 (two) times daily as needed (for psoriasis).    [provider]  escitalopram (LEXAPRO) 20 MG tablet Take 1 tablet (20 mg total) by mouth daily. Patient not taking: Reported on 03/21/2017 03/21/17   Carmon Ginsberg, PA  ezetimibe (ZETIA) 10 MG tablet Take 1 tablet (10 mg total) by mouth daily. Patient taking differently: Take 10 mg by mouth at bedtime.  05/15/16 12/10/16  Minna Merritts, MD  isosorbide mononitrate (IMDUR) 30 MG 24 hr tablet TAKE ONE TABLET BY MOUTH DAILY Patient taking differently: TAKE ONE TABLET BY MOUTH DAILY AT BEDTIME 08/14/16   Minna Merritts, MD  metoprolol tartrate (LOPRESSOR) 25 MG tablet Take 1 tablet (25 mg total) by mouth 2 (two) times daily. 11/20/16 02/18/17  Rise Mu, PA-C  nitroGLYCERIN (NITROSTAT) 0.4 MG SL tablet Place 1 tablet (0.4 mg total) under the tongue every 5 (five) minutes as needed. May repeat for up to 3 doses. 05/15/16   Minna Merritts, MD  RANEXA 1000 MG SR tablet TAKE 1 TABLET BY MOUTH 2 TIMES DAILY 12/11/16   Minna Merritts, MD  ranitidine (ZANTAC) 300 MG tablet TAKE 1 TABLET BY MOUTH ONCE DAILY Patient taking differently: TAKE 1 TABLET BY MOUTH ONCE DAILY AT BEDTIME 03/29/16   Carmon Ginsberg, PA  rosuvastatin (CRESTOR) 40 MG tablet TAKE 1 TABLET (40 MG TOTAL) BY  MOUTH DAILY. Patient taking differently: TAKE 1 TABLET (40 MG TOTAL) BY MOUTH DAILY AT BEDTIME. 09/03/16   Gollan, Kathlene November, MD    Family History Family History  Problem Relation Age of Onset  . Diabetes Sister   . Ovarian cancer Sister   . Psychiatric Illness Father   . Heart attack Father   . Diabetes Other   . Hypertension Mother   . Heart disease Mother   . Lung cancer Paternal Aunt   . Coronary artery disease Other   . Diabetes Other   . Hypertension Other   . Hyperlipidemia Other   . Cancer Paternal Uncle   . Heart disease Paternal Grandmother   . Heart disease Paternal Grandfather     Social History Social History  Tobacco Use  . Smoking status: Never Smoker  . Smokeless tobacco: Never Used  Substance Use Topics  . Alcohol use: No  . Drug use: No    Allergies   Strawberry extract and Dilaudid [hydromorphone hcl]   Review of Systems Review of Systems  Constitutional: Negative.   Gastrointestinal: Positive for abdominal pain and diarrhea.  All other systems reviewed and are negative.  Physical Exam Triage Vital Signs ED Triage Vitals  Enc Vitals Group     BP 03/24/17 1352 106/72     Pulse Rate 03/24/17 1352 64     Resp 03/24/17 1352 18     Temp 03/24/17 1352 98 F (36.7 C)     Temp Source 03/24/17 1352 Oral     SpO2 03/24/17 1352 98 %     Weight 03/24/17 1353 186 lb 2 oz (84.4 kg)     Height 03/24/17 1353 5\' 6"  (1.676 m)     Head Circumference --      Peak Flow --      Pain Score 03/24/17 1523 2     Pain Loc --      Pain Edu? --      Excl. in IXL? --    Updated Vital Signs BP 106/72 (BP Location: Left Arm)   Pulse 64   Temp 98 F (36.7 C) (Oral)   Resp 18   Ht 5\' 6"  (1.676 m)   Wt 186 lb 2 oz (84.4 kg)   SpO2 98%   BMI 30.04 kg/m    Physical Exam  Constitutional: He is oriented to person, place, and time. He appears well-developed and well-nourished. No distress.  HENT:  Head: Normocephalic and atraumatic.  Nose: Nose normal.    Eyes: Conjunctivae are normal. No scleral icterus.  Cardiovascular: Normal rate and regular rhythm.  No murmur heard. Pulmonary/Chest: Effort normal and breath sounds normal. He has no wheezes. He has no rales.  Abdominal: Soft. He exhibits no distension and no mass. There is no tenderness. There is no rebound and no guarding.  Musculoskeletal: Normal range of motion.  Neurological: He is alert and oriented to person, place, and time.  Skin: Skin is warm. No rash noted.  Psychiatric: He has a normal mood and affect. His behavior is normal.  Vitals reviewed.  UC Treatments / Results  Labs (all labs ordered are listed, but only abnormal results are displayed) Labs Reviewed  CBC WITH DIFFERENTIAL/PLATELET - Abnormal; Notable for the following components:      Result Value   RBC 4.20 (*)    HCT 38.9 (*)    Monocytes Absolute 1.2 (*)    All other components within normal limits  COMPREHENSIVE METABOLIC PANEL - Abnormal; Notable for the following components:   BUN 21 (*)    Calcium 8.6 (*)    All other components within normal limits  LIPASE, BLOOD  LACTIC ACID, PLASMA   EKG  EKG Interpretation None      Radiology No results found.  Procedures Procedures (including critical care time)  Medications Ordered in UC Medications - No data to display   Initial Impression / Assessment and Plan / UC Course  I have reviewed the triage vital signs and the nursing notes.  Pertinent labs & imaging results that were available during my care of the patient were reviewed by me and considered in my medical decision making (see chart for details).    61 year old male presents with severe abdominal pain.  His pain seems to be out  of proportion.  Given his past medical history, I am concerned about mesenteric ischemia.  His labs were obtained today and were unremarkable.  Lactic acid pending.  I am sending him to the [facility] ER for continued monitoring and CT imaging.  Patient and his  wife are in agreement.  Final Clinical Impressions(s) / UC Diagnoses   Final diagnoses:  Mesenteric ischemia [facility])    ED Discharge Orders    None     Controlled Substance Prescriptions [place] Controlled Substance Registry consulted? Not Applicable   Coral Spikes, DO 03/24/17 1531

## 2017-03-24 NOTE — ED Notes (Signed)
Sent from [facility]  for tests

## 2017-03-24 NOTE — Discharge Instructions (Signed)
I am concerned that Mr. Maalouf has mesenteric ischemia.  Awaiting lactic acid. Labs otherwise stable.  Please image.  Dr. [facility]

## 2017-03-24 NOTE — ED Triage Notes (Signed)
abd pain at night since Wednesday especially at the same time around 0300 am  The pain gets severe he was sent here by his doctor   To be examine for mesenteric ischemia

## 2017-03-24 NOTE — ED Triage Notes (Signed)
Pt reports Wed evening after eating he developed a sudden abd pain that was sharp in nature across low abd.  Had one episode diarrhea. Pain happened once again during the night.  Denies n/v. Went to PCP on Thursday. Dr. Rockey Situ him they thought he had a virus. Now every night the pain returns for at least 5 minutes and is "intense and crushing" Pt reports he still feels something is "off" but doesn't have any pain currently.

## 2017-03-24 NOTE — ED Notes (Signed)
Dr Vanita Panda states he is going to cancel I stat lactic acid

## 2017-03-24 NOTE — ED Provider Notes (Addendum)
[place] EMERGENCY DEPARTMENT Provider Note   CSN: [id number] Arrival date & time: 03/24/17  1630     History   Chief Complaint Chief Complaint  Patient presents with  . Abdominal Pain    HPI Kevin [name] is a 61 y.o. male.  HPI  Patient presents with concern of epigastric pain. Patient was in his usual state of health until 4 months ago. At that point, after eating he felt onset of epigastric discomfort, nausea. Symptoms are sharp, severe, crampy, nonradiating. Symptoms improved, but over each of the next 3 nights he has had a similar recurrence, at night, while lying supine, not after eating, drinking, or doing any other memorable activity. The pain is focally in the epigastrium, and upper abdomen, nonradiating.  When pain is severe, crampy, distinct from prior episodes of pancreatitis, hepatobiliary dysfunction. Currently the patient has minimal complaints. Today the patient went to urgent care, and was sent to the emergency department for further evaluation and management. Past Medical History:  Diagnosis Date  . ASD (atrial septal defect)   . Brachial plexopathy   . CAD (coronary artery disease)    s/p CABG  . Coronary artery disease    a. s/p 4 vessel CABG in 05/2008 w/ LIMA-LAD, SVG-Diag, sequential SVG-OM1/OM2; b. s/p PCI/DES x 2 to LAD in 10/2009; c. LHC 03/2013 stable disease and patent LAD stents  . H/O maze procedure 05/17/08   closure of ASD  . Hyperlipidemia   . 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   . Hypertension   . OSA (obstructive sleep apnea)   . Overweight(278.02)   . PAF (paroxysmal atrial fibrillation) ([place])    a. s/p Maze 05/2008, previously on Eliquis->discontinued 05/2016; c. CHADS2VASc => 4 (HTN, stroke x 2, vascular disease)  . Patent foramen ovale    s/p closure  . Psoriatic arthritis ([place])   . Sleep apnea   . Stroke [facility])    right brain, 07/2005,left brain, 05/2005  .  Thrombocytopenia [facility])     Patient Active Problem List   Diagnosis Date Noted  . Unstable angina ([place]) 11/20/2016  . MCI (mild cognitive impairment) 11/14/2016  . Diverticulosis of colon 06/14/2016  . Obstructive sleep apnea 09/22/2015  . BP (high blood pressure) 09/22/2015  . Allergic rhinitis 09/22/2015  . Anxiety 09/22/2015  . 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[place]) 05/14/2014  . 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 NSVT (nonsustained ventricular tachycardia) ([place]) 04/07/2013  . 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 Memory problem 02/12/2012  . Cerebrovascular accident (CVA) ([place]) 06/21/2010  . Atherosclerosis of coronary artery bypass graft of native heart with stable angina pectoris ([place]) 05/13/2009  . Hyperlipidemia 08/11/2008  . Obesity 08/11/2008  . THROMBOCYTOPENIA 08/11/2008  . Coronary atherosclerosis 08/11/2008  . ATRIAL FIBRILLATION 08/11/2008  . PATENT FORAMEN OVALE 08/11/2008    Past Surgical History:  Procedure Laterality Date  . ACUTE PANCREATITIS  5/11  . APPENDECTOMY  1984  . CHOLECYSTECTOMY  08/19/2009  . CORONARY ARTERY BYPASS GRAFT  05/12/2008   x4 by PeterVan Trigt,MD  . LEFT HEART CATH AND CORONARY ANGIOGRAPHY N/A 11/29/2016   Procedure: LEFT HEART CATH AND CORONARY ANGIOGRAPHY;  Surgeon: Wellington Hampshire, MD;  Location: [place] CV LAB;  Service: Cardiovascular;  Laterality: N/A;  . PATENT FORAMEN OVALE CLOSURE    . STINTS  8/11   X2  . TONSILLECTOMY     AS A CHILD       Home  Medications    Prior to Admission medications   Medication Sig Start Date End Date Taking? Authorizing Provider  ALPRAZolam Duanne Moron) 0.5 MG tablet Take 1-2 tablets (0.5-1 mg total) by mouth at bedtime. Patient taking differently: Take 0.5 mg by mouth at bedtime.  10/04/16  Yes Carmon Ginsberg, PA  aspirin EC 81 MG tablet Take 81 mg by mouth daily.   Yes [provider]  cetirizine (ZYRTEC) 10 MG tablet Take 10 mg by mouth at bedtime.    Yes [provider]    clobetasol ointment (TEMOVATE) 7.82 % Apply 1 application topically 2 (two) times daily as needed (for psoriasis).   Yes [provider]  clopidogrel (PLAVIX) 75 MG tablet Take 1 tablet (75 mg total) by mouth daily. 05/15/16  Yes Gollan, Kathlene November, MD  desonide (DESOWEN) 0.05 % cream Apply 1 application topically 2 (two) times daily as needed (for psoriasis).   Yes [provider]  escitalopram (LEXAPRO) 20 MG tablet Take 1 tablet (20 mg total) by mouth daily. 03/21/17  Yes Carmon Ginsberg, PA  ezetimibe (ZETIA) 10 MG tablet Take 1 tablet (10 mg total) by mouth daily. Patient taking differently: Take 10 mg by mouth at bedtime.  05/15/16 03/24/17 Yes Gollan, Kathlene November, MD  isosorbide mononitrate (IMDUR) 30 MG 24 hr tablet TAKE ONE TABLET BY MOUTH DAILY Patient taking differently: TAKE ONE TABLET (30mg ) BY MOUTH DAILY AT BEDTIME 08/14/16  Yes Gollan, Kathlene November, MD  metoprolol tartrate (LOPRESSOR) 25 MG tablet Take 1 tablet (25 mg total) by mouth 2 (two) times daily. 11/20/16 03/24/17 Yes Dunn, Areta Haber, PA-C  nitroGLYCERIN (NITROSTAT) 0.4 MG SL tablet Place 1 tablet (0.4 mg total) under the tongue every 5 (five) minutes as needed. May repeat for up to 3 doses. 05/15/16  Yes Gollan, Kathlene November, MD  RANEXA 1000 MG SR tablet TAKE 1 TABLET BY MOUTH 2 TIMES DAILY Patient taking differently: TAKE 1 TABLET (1000mg ) BY MOUTH 2 TIMES DAILY 12/11/16  Yes Gollan, Kathlene November, MD  ranitidine (ZANTAC) 300 MG tablet TAKE 1 TABLET BY MOUTH ONCE DAILY Patient taking differently: TAKE 1 TABLET (300mg ) BY MOUTH ONCE DAILY AT BEDTIME 03/29/16  Yes Chauvin, Herbie [place], PA  rosuvastatin (CRESTOR) 40 MG tablet TAKE 1 TABLET (40 MG TOTAL) BY MOUTH DAILY. Patient taking differently: TAKE 1 TABLET (40 MG TOTAL) BY MOUTH DAILY AT BEDTIME. 09/03/16  Yes Gollan, Kathlene November, MD    Family History Family History  Problem Relation Age of Onset  . Diabetes Sister   . Ovarian cancer Sister   . Psychiatric Illness Father   .  Heart attack Father   . Diabetes Other   . Hypertension Mother   . Heart disease Mother   . Lung cancer Paternal Aunt   . Coronary artery disease Other   . Diabetes Other   . Hypertension Other   . Hyperlipidemia Other   . Cancer Paternal Uncle   . Heart disease Paternal Grandmother   . Heart disease Paternal Grandfather     Social History Social History   Tobacco Use  . Smoking status: Never Smoker  . Smokeless tobacco: Never Used  Substance Use Topics  . Alcohol use: No  . Drug use: No     Allergies   Strawberry extract and Dilaudid [hydromorphone hcl]   Review of Systems Review of Systems  Constitutional:       Per HPI, otherwise negative  HENT:       Per HPI, otherwise negative  Respiratory:  Per HPI, otherwise negative  Cardiovascular:       Per HPI, otherwise negative  Gastrointestinal: Positive for abdominal pain, diarrhea, nausea and vomiting.  Endocrine:       Negative aside from HPI  Genitourinary:       Neg aside from HPI   Musculoskeletal:       Per HPI, otherwise negative  Skin: Negative.   Neurological: Negative for syncope.     Physical Exam Updated Vital Signs BP 115/66   Pulse 65   Temp 98 F (36.7 C) (Oral)   Resp 10   Ht 5\' 6"  (1.676 m)   Wt 82.8 kg (182 lb 9.6 oz)   SpO2 99%   BMI 29.47 kg/m   Physical Exam  Constitutional: He is oriented to person, place, and time. He appears well-developed. No distress.  HENT:  Head: Normocephalic and atraumatic.  Eyes: Conjunctivae and EOM are normal.  Cardiovascular: Normal rate and regular rhythm.  Pulmonary/Chest: Effort normal. No stridor. No respiratory distress.  Abdominal: He exhibits no distension.  Patient has minimal discomfort anywhere, but some guarding, tenderness about the epigastrium  Musculoskeletal: He exhibits no edema.  Neurological: He is alert and oriented to person, place, and time.  Skin: Skin is warm and dry.  Psychiatric: He has a normal mood and affect.    Nursing note and vitals reviewed.    ED Treatments / Results  Labs (all labs ordered are listed, but only abnormal results are displayed) Labs Reviewed  TROPONIN I  I-STAT CG4 LACTIC ACID, ED    EKG  EKG Interpretation  Date/Time:  Sunday March 24 2017 17:13:03 EST Ventricular Rate:  62 PR Interval:  130 QRS Duration: 92 QT Interval:  422 QTC Calculation: 428 R Axis:   -19 Text Interpretation:  Normal sinus rhythm Nonspecific T wave abnormality Baseline wander Abnormal ekg Confirmed by Carmin Muskrat [phone]) on 03/24/2017 6:18:06 PM       Radiology Ct Abdomen Pelvis W Contrast  Result Date: 03/24/2017 CLINICAL DATA:  61 year old male with abdominal pain. EXAM: CT ABDOMEN AND PELVIS WITH CONTRAST TECHNIQUE: Multidetector CT imaging of the abdomen and pelvis was performed using the standard protocol following bolus administration of intravenous contrast. CONTRAST:  165mL ISOVUE-300 IOPAMIDOL (ISOVUE-300) INJECTION 61% COMPARISON:  Abdominal radiograph dated 03/24/2017 and CT 12/18/2013 FINDINGS: Lower chest: The visualized lung bases are clear. There is coronary vascular calcification. No intra-abdominal free air or free fluid. Hepatobiliary: L cholecystectomy. No intrahepatic biliary ductal dilatation. The liver is unremarkable. Pancreas: Unremarkable. No pancreatic ductal dilatation or surrounding inflammatory changes. Spleen: Normal in size without focal abnormality. Adrenals/Urinary Tract: Adrenal glands are unremarkable. Kidneys are normal, without renal calculi, focal lesion, or hydronephrosis. Bladder is unremarkable. Stomach/Bowel: There is sigmoid diverticulosis and scattered colonic diverticula. There is active inflammatory changes involving a diverticulum in the sigmoid colon consistent with acute diverticulitis. There is no diverticular perforation or abscess. There is a 1.5 cm duodenal diverticulum at the head of the pancreas. There is no bowel obstruction.  Appendectomy. Vascular/Lymphatic: Mild aortoiliac atherosclerotic disease. The abdominal aorta and IVC are otherwise unremarkable. No portal venous gas. There is no adenopathy. Reproductive: The prostate and seminal vesicles are grossly unremarkable. Other: None Musculoskeletal: Mild degenerative changes of the spine most prominent at L5-S1 with disc desiccation and vacuum phenomena. No acute osseous pathology. IMPRESSION: 1. Sigmoid diverticulitis.  No diverticular abscess or perforation. 2.  Aortic Atherosclerosis (ICD10-I70.0). Electronically Signed   By: Anner Crete [name]   On: 03/24/2017 19:11  Dg Abdomen Acute W/chest  Result Date: 03/24/2017 CLINICAL DATA:  Abdominal pain 4 days worse at night. EXAM: DG ABDOMEN ACUTE W/ 1V CHEST COMPARISON:  Chest x-ray 03/18/2013 and CT 12/18/2013 FINDINGS: Sternotomy wires are present. Lungs are adequately inflated and otherwise clear. Cardiomediastinal silhouette and remainder of the chest is unchanged. Abdominopelvic images demonstrate surgical clips over the right upper quadrant. Bowel gas pattern is nonobstructive. There is no free peritoneal air. Minimal degenerate change of the spine and hips. IMPRESSION: Negative abdominal radiographs.  No acute cardiopulmonary disease. Electronically Signed   By: Marin Olp [name]   On: 03/24/2017 18:33    Procedures Procedures (including critical care time)  Medications Ordered in ED Medications  ciprofloxacin (CIPRO) IVPB 400 mg (not administered)  metroNIDAZOLE (FLAGYL) IVPB 500 mg (not administered)  sodium chloride 0.9 % bolus 1,000 mL (0 mLs Intravenous Stopped 03/24/17 1939)  iopamidol (ISOVUE-300) 61 % injection (100 mLs  Contrast Given 03/24/17 1847)     Initial Impression / Assessment and Plan / ED Course  I have reviewed the triage vital signs and the nursing notes.  Pertinent labs & imaging results that were available during my care of the patient were reviewed by me and considered in my  medical decision making (see chart for details).  After the initial evaluation I reviewed the patient's chart, including Dr. mentation from urgent care earlier today.  8:59 PM 8:59 PM Patient in no distress, awake, alert. Here labs consistent with labs performed at urgent care, no lactic acidosis, no evidence for ongoing coronary ischemia, or other substantial abnormalities. CT scan reviewed with the patient and his wife. There is concern for diverticulitis, given the patient's description of symptoms began after eating corn 3 nights ago, this is consistent with his description of illness. We discussed at length, etiology of his diverticulitis, possible complications, need for antibiotics, close outpatient follow-up with gastroenterology. With otherwise reassuring findings, no evidence for peritonitis, perforation or obstruction, or mesenteric ischemia, the patient was discharged after initial antibiotic provided here via IV.  Final Clinical Impressions(s) / ED Diagnoses  Acute diverticulitis   Carmin Muskrat, MD 03/24/17 2100    Carmin Muskrat, MD 03/24/17 2235

## 2017-03-25 ENCOUNTER — Other Ambulatory Visit: Payer: Self-pay | Admitting: Family Medicine

## 2017-03-25 ENCOUNTER — Encounter: Payer: Self-pay | Admitting: Physician Assistant

## 2017-03-28 ENCOUNTER — Telehealth: Payer: Self-pay | Admitting: *Deleted

## 2017-03-28 NOTE — Telephone Encounter (Signed)
Patient uses [facility].

## 2017-03-28 NOTE — Telephone Encounter (Addendum)
Patient's wife [name] called concerning abdominal pain. Patient was seen 03/21/2017 for abdominal pain. Patient went to ER 03/24/2017 and was diagnosed with diverticulitis. Patient is in a lot of intermittent. Patient's wife would like advised on what they should do concerning pain. Patient does not see GI until 04/10/2016. Cb# [phone]

## 2017-03-28 NOTE — Telephone Encounter (Signed)
Spoke with pts wife. She was able to get pt in with GI tomorrow at 11:30. She just wanted to let you know.

## 2017-03-29 ENCOUNTER — Ambulatory Visit (INDEPENDENT_AMBULATORY_CARE_PROVIDER_SITE_OTHER): Payer: 59 | Admitting: Gastroenterology

## 2017-03-29 ENCOUNTER — Encounter: Payer: Self-pay | Admitting: Gastroenterology

## 2017-03-29 ENCOUNTER — Other Ambulatory Visit: Payer: Self-pay

## 2017-03-29 VITALS — BP 107/68 | HR 57 | Ht 65.0 in | Wt 189.2 lb

## 2017-03-29 DIAGNOSIS — K5732 Diverticulitis of large intestine without perforation or abscess without bleeding: Secondary | ICD-10-CM | POA: Diagnosis not present

## 2017-03-29 NOTE — Progress Notes (Signed)
Kevin [name] [address]  [place]  [place], [place] [address]  Main: [phone]  Fax: [phone]   Gastroenterology Consultation  Referring Provider:     Carmon Ginsberg, [place] Primary Care Physician:  Carmon Ginsberg, [place] Primary Gastroenterologist:  Dr. Vonda [name] Reason for Consultation: Diverticulitis        HPI:   Mateusz Neilan is a 61 y.o. y/o male referred for consultation & management  by Dr. Carmon Ginsberg, [place].  Patient recently went to the ER on December 23, 5 days ago, with abdominal pain was diagnosed with diverticulitis.  Pain started in the mid abdomen 9 days ago, crushing in quality, 10/10, with radiation to the back, not associated with any nausea vomiting, associated with loose stools.  No melena or bright red blood per rectum.  No fever chills.  CT showed active inflammatory changes involving sigmoid diverticulum.  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  No bowel obstruction was noted.  Patient was discharged from the ER with oral antibiotics and has been improving since then.  States abdominal pain has resolved but now he has an abdominal discomfort only.  Is tolerating oral diet without nausea.  Describes 1-2 loose stools daily without blood.  No weight loss.  Denies any previous colonoscopies.  Denies any previous EGDs.  Denies family history of colon cancer.  Prior to this episode reports daily constipation and does not use anything for his bowel movements.  In 2011 had a cholecystectomy reportedly for cholecystitis.  States also developed acute pancreatitis from CBD stone at the time in [place].  Denies any alcohol or smoking or drug use.  Past Medical History:  Diagnosis Date  . ASD (atrial septal defect)   . Brachial plexopathy   . CAD (coronary artery disease)    s/p CABG  . Coronary artery disease    a. s/p 4 vessel CABG in 05/2008 w/ LIMA-LAD, SVG-Diag, sequential SVG-OM1/OM2; b. s/p  PCI/DES x 2 to LAD in 10/2009; c. LHC 03/2013 stable disease and patent LAD stents  . H/O maze procedure 05/17/08   closure of ASD  . Hyperlipidemia   . 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   . Hypertension   . OSA (obstructive sleep apnea)   . Overweight(278.02)   . PAF (paroxysmal atrial fibrillation) ([place])    a. s/p Maze 05/2008, previously on Eliquis->discontinued 05/2016; c. CHADS2VASc => 4 (HTN, stroke x 2, vascular disease)  . Patent foramen ovale    s/p closure  . Psoriatic arthritis ([place])   . Sleep apnea   . 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 Thrombocytopenia ([place])     Past Surgical History:  Procedure Laterality Date  . ACUTE PANCREATITIS  5/11  . APPENDECTOMY  1984  . CHOLECYSTECTOMY  08/19/2009  . CORONARY ARTERY BYPASS GRAFT  05/12/2008   x4 by PeterVan Trigt,MD  . LEFT HEART CATH AND CORONARY ANGIOGRAPHY N/A 11/29/2016   Procedure: LEFT HEART CATH AND CORONARY ANGIOGRAPHY;  Surgeon: Wellington Hampshire, MD;  Location: [facility];  Service: Cardiovascular;  Laterality: N/A;  . PATENT FORAMEN OVALE CLOSURE    . STINTS  8/11   X2  . TONSILLECTOMY     AS A CHILD    Prior to Admission medications   Medication Sig Start Date End Date Taking? Authorizing Provider  ALPRAZolam Duanne Moron) 0.5 MG  tablet Take 1-2 tablets (0.5-1 mg total) by mouth at bedtime. Patient taking differently: Take 0.5 mg by mouth at bedtime.  10/04/16   Carmon Ginsberg, PA  aspirin EC 81 MG tablet Take 81 mg by mouth daily.    [provider]  cetirizine (ZYRTEC) 10 MG tablet Take 10 mg by mouth at bedtime.     [provider]  ciprofloxacin (CIPRO) 500 MG tablet Take 1 tablet (500 mg total) by mouth 2 (two) times daily for 7 days. 03/24/17 03/31/17  Carmin Muskrat, MD  clobetasol ointment (TEMOVATE) 8.52 % Apply 1 application topically 2 (two) times daily as needed (for psoriasis).    [provider]    clopidogrel (PLAVIX) 75 MG tablet Take 1 tablet (75 mg total) by mouth daily. 05/15/16   Minna Merritts, MD  desonide (DESOWEN) 0.05 % cream Apply 1 application topically 2 (two) times daily as needed (for psoriasis).    [provider]  escitalopram (LEXAPRO) 20 MG tablet Take 1 tablet (20 mg total) by mouth daily. 03/21/17   Carmon Ginsberg, PA  ezetimibe (ZETIA) 10 MG tablet Take 1 tablet (10 mg total) by mouth daily. Patient taking differently: Take 10 mg by mouth at bedtime.  05/15/16 03/24/17  Minna Merritts, MD  isosorbide mononitrate (IMDUR) 30 MG 24 hr tablet TAKE ONE TABLET BY MOUTH DAILY Patient taking differently: TAKE ONE TABLET (30mg ) BY MOUTH DAILY AT BEDTIME 08/14/16   Minna Merritts, MD  metoprolol tartrate (LOPRESSOR) 25 MG tablet Take 1 tablet (25 mg total) by mouth 2 (two) times daily. 11/20/16 03/24/17  Rise Mu, PA-C  metroNIDAZOLE (FLAGYL) 500 MG tablet Take 1 tablet (500 mg total) by mouth 2 (two) times daily for 7 days. For ten days 03/24/17 03/31/17  Carmin Muskrat, MD  nitroGLYCERIN (NITROSTAT) 0.4 MG SL tablet Place 1 tablet (0.4 mg total) under the tongue every 5 (five) minutes as needed. May repeat for up to 3 doses. 05/15/16   Gollan, Kathlene November, MD  RANEXA 1000 MG SR tablet TAKE 1 TABLET BY MOUTH 2 TIMES DAILY Patient taking differently: TAKE 1 TABLET (1000mg ) BY MOUTH 2 TIMES DAILY 12/11/16   Minna Merritts, MD  ranitidine (ZANTAC) 300 MG tablet TAKE 1 TABLET BY MOUTH ONCE DAILY 03/25/17   Carmon Ginsberg, PA  rosuvastatin (CRESTOR) 40 MG tablet TAKE 1 TABLET (40 MG TOTAL) BY MOUTH DAILY. Patient taking differently: TAKE 1 TABLET (40 MG TOTAL) BY MOUTH DAILY AT BEDTIME. 09/03/16   Gollan, Kathlene November, MD    Family History  Problem Relation Age of Onset  . Diabetes Sister   . Ovarian cancer Sister   . Psychiatric Illness Father   . Heart attack Father   . Diabetes Other   . Hypertension Mother   . Heart disease Mother   . Lung cancer  Paternal Aunt   . Coronary artery disease Other   . Diabetes Other   . Hypertension Other   . Hyperlipidemia Other   . Cancer Paternal Uncle   . Heart disease Paternal Grandmother   . Heart disease Paternal Grandfather      Social History   Tobacco Use  . Smoking status: Never Smoker  . Smokeless tobacco: Never Used  Substance Use Topics  . Alcohol use: No  . Drug use: No    Allergies as of 03/29/2017 - Review Complete 03/29/2017  Allergen Reaction Noted  . Strawberry extract Anaphylaxis 12/04/2011  . Dilaudid [hydromorphone hcl] Nausea And Vomiting 06/21/2010  Review of Systems:    All systems reviewed and negative except where noted in HPI.   Physical Exam:  BP 107/68   Pulse (!) 57   Ht 5\' 5"  (1.651 m)   Wt 189 lb 3.2 oz (85.8 kg)   BMI 31.48 kg/m  No LMP for male patient. Psych:  Alert and cooperative. Normal mood and affect. General:   Alert,  Well-developed, well-nourished, pleasant and cooperative in NAD Head:  Normocephalic and atraumatic. Eyes:  Sclera clear, no icterus.   Conjunctiva pink. Ears:  Normal auditory acuity. Nose:  No deformity, discharge, or lesions. Mouth:  No deformity or lesions,oropharynx pink & moist. Neck:  Supple; no masses or thyromegaly. Lungs:  Respirations even and unlabored.  Clear throughout to auscultation.   No wheezes, crackles, or rhonchi. No acute distress. Abdomen:  Normal bowel sounds.  No bruits.  Soft, non-tender and non-distended without masses, hepatosplenomegaly or hernias noted.  No guarding or rebound tenderness.    Msk:  Symmetrical without gross deformities. Good, equal movement & strength bilaterally. Pulses:  Normal pulses noted. Extremities:  No clubbing or edema.  No cyanosis. Neurologic:  Alert and oriented x3;  grossly normal neurologically. Skin:  Intact without significant lesions or rashes. No jaundice. Lymph Nodes:  No significant cervical adenopathy. Psych:  Alert and cooperative. Normal mood and  affect.   Labs: CBC    Component Value Date/Time   WBC 9.1 03/24/2017 1431   RBC 4.20 (L) 03/24/2017 1431   HGB 13.6 03/24/2017 1431   HGB 13.4 06/14/2016 1156   HCT 38.9 (L) 03/24/2017 1431   HCT 39.6 06/14/2016 1156   PLT 179 03/24/2017 1431   PLT 195 06/14/2016 1156   MCV 92.7 03/24/2017 1431   MCV 96 06/14/2016 1156   MCV 94 03/18/2013 1739   MCH 32.4 03/24/2017 1431   MCHC 35.0 03/24/2017 1431   RDW 12.7 03/24/2017 1431   RDW 13.6 06/14/2016 1156   RDW 13.0 03/18/2013 1739   LYMPHSABS 1.6 03/24/2017 1431   LYMPHSABS 1.7 06/14/2016 1156   LYMPHSABS 1.8 03/18/2013 1739   MONOABS 1.2 (H) 03/24/2017 1431   MONOABS 0.9 03/18/2013 1739   EOSABS 0.4 03/24/2017 1431   EOSABS 0.4 06/14/2016 1156   EOSABS 0.5 03/18/2013 1739   BASOSABS 0.0 03/24/2017 1431   BASOSABS 0.0 06/14/2016 1156   BASOSABS 0.1 03/18/2013 1739   CMP     Component Value Date/Time   NA 137 03/24/2017 1431   NA 142 06/14/2016 1156   NA 141 03/18/2013 1739   K 4.1 03/24/2017 1431   K 4.0 03/18/2013 1739   CL 102 03/24/2017 1431   CL 107 03/18/2013 1739   CO2 26 03/24/2017 1431   CO2 30 03/18/2013 1739   GLUCOSE 96 03/24/2017 1431   GLUCOSE 79 03/18/2013 1739   BUN 21 (H) 03/24/2017 1431   BUN 18 06/14/2016 1156   BUN 23 (H) 03/18/2013 1739   CREATININE 1.22 03/24/2017 1431   CREATININE 1.50 (H) 12/18/2013 1008   CALCIUM 8.6 (L) 03/24/2017 1431   CALCIUM 9.2 03/18/2013 1739   PROT 7.5 03/24/2017 1431   PROT 6.9 06/14/2016 1156   ALBUMIN 4.1 03/24/2017 1431   ALBUMIN 4.4 06/14/2016 1156   AST 25 03/24/2017 1431   ALT 23 03/24/2017 1431   ALKPHOS 64 03/24/2017 1431   BILITOT 0.9 03/24/2017 1431   BILITOT 0.6 06/14/2016 1156   GFRNONAA >60 03/24/2017 1431   GFRNONAA 50 (L) 12/18/2013 1008   GFRAA >60 03/24/2017  [place] (L) 12/18/2013 1008    Imaging Studies: Ct Abdomen Pelvis W Contrast  Result Date: 03/24/2017 CLINICAL DATA:  61 year old male with abdominal pain. EXAM: CT  ABDOMEN AND PELVIS WITH CONTRAST TECHNIQUE: Multidetector CT imaging of the abdomen and pelvis was performed using the standard protocol following bolus administration of intravenous contrast. CONTRAST:  140mL ISOVUE-300 IOPAMIDOL (ISOVUE-300) INJECTION 61% COMPARISON:  Abdominal radiograph dated 03/24/2017 and CT 12/18/2013 FINDINGS: Lower chest: The visualized lung bases are clear. There is coronary vascular calcification. No intra-abdominal free air or free fluid. Hepatobiliary: L cholecystectomy. No intrahepatic biliary ductal dilatation. The liver is unremarkable. Pancreas: Unremarkable. No pancreatic ductal dilatation or surrounding inflammatory changes. Spleen: Normal in size without focal abnormality. Adrenals/Urinary Tract: Adrenal glands are unremarkable. Kidneys are normal, without renal calculi, focal lesion, or hydronephrosis. Bladder is unremarkable. Stomach/Bowel: There is sigmoid diverticulosis and scattered colonic diverticula. There is active inflammatory changes involving a diverticulum in the sigmoid colon consistent with acute diverticulitis. There is no diverticular perforation or abscess. There is a 1.5 cm duodenal diverticulum at the head of the pancreas. There is no bowel obstruction. Appendectomy. Vascular/Lymphatic: Mild aortoiliac atherosclerotic disease. The abdominal aorta and IVC are otherwise unremarkable. No portal venous gas. There is no adenopathy. Reproductive: The prostate and seminal vesicles are grossly unremarkable. Other: None Musculoskeletal: Mild degenerative changes of the spine most prominent at L5-S1 with disc desiccation and vacuum phenomena. No acute osseous pathology. IMPRESSION: 1. Sigmoid diverticulitis.  No diverticular abscess or perforation. 2.  Aortic Atherosclerosis (ICD10-I70.0). Electronically Signed   By: Anner Crete [name]   On: 03/24/2017 19:11   Dg Abdomen Acute W/chest  Result Date: 03/24/2017 CLINICAL DATA:  Abdominal pain 4 days worse at night.  EXAM: DG ABDOMEN ACUTE W/ 1V CHEST COMPARISON:  Chest x-ray 03/18/2013 and CT 12/18/2013 FINDINGS: Sternotomy wires are present. Lungs are adequately inflated and otherwise clear. Cardiomediastinal silhouette and remainder of the chest is unchanged. Abdominopelvic images demonstrate surgical clips over the right upper quadrant. Bowel gas pattern is nonobstructive. There is no free peritoneal air. Minimal degenerate change of the spine and hips. IMPRESSION: Negative abdominal radiographs.  No acute cardiopulmonary disease. Electronically Signed   By: Marin Olp [name]   On: 03/24/2017 18:33    Assessment and Plan:   Britain Saber is a 61 y.o. y/o male has been referred for recent episode of diverticulitis on December 23 with improvement after oral antibiotics  Diverticulitis symptomatically resolving Patient's pain has improved significantly He is tolerating an oral diet, does not have any blood in stools or any alarm symptoms  As he has never had a colonoscopy, he needs a screening colonoscopy and want to rule out any colonic lesions given his recent episode of diverticulitis Due to the recent diverticulitis, we would need to wait for active inflammation to heal prior to any colonoscopy procedures as colonoscopy is contraindicated in the presence of active diverticulitis.  We will schedule for colonoscopy in 2-3 months Patient asked to maintain soft stools with 1-2 bowel movements a day.  After his loose stools to resolve and his abdominal discomfort resolves, he was asked to start taking MiraLAX daily and titrate to maintain 1-2 soft bowel movements every day.  He verbalized understanding.  He was asked to avoid constipation.  If he develops worsening abdominal pain, bright red blood per rectum or melena, fever chills or any other symptoms of concern he was asked to call us and he verbalized understanding  Dr Margretta Sidle  [place]

## 2017-04-01 ENCOUNTER — Other Ambulatory Visit: Payer: Self-pay

## 2017-04-01 ENCOUNTER — Encounter: Payer: Self-pay | Admitting: Emergency Medicine

## 2017-04-01 ENCOUNTER — Emergency Department: Payer: 59

## 2017-04-01 ENCOUNTER — Emergency Department
Admission: EM | Admit: 2017-04-01 | Discharge: 2017-04-01 | Disposition: A | Discharge status: Home / Self Care | Payer: 59 | Attending: Emergency Medicine | Admitting: Emergency Medicine

## 2017-04-01 ENCOUNTER — Telehealth: Payer: Self-pay | Admitting: Gastroenterology

## 2017-04-01 DIAGNOSIS — Z7902 Long term (current) use of antithrombotics/antiplatelets: Secondary | ICD-10-CM | POA: Insufficient documentation

## 2017-04-01 DIAGNOSIS — K5730 Diverticulosis of large intestine without perforation or abscess without bleeding: Secondary | ICD-10-CM | POA: Diagnosis not present

## 2017-04-01 DIAGNOSIS — Z7982 Long term (current) use of aspirin: Secondary | ICD-10-CM | POA: Diagnosis not present

## 2017-04-01 DIAGNOSIS — Z8673 Personal history of transient ischemic attack (TIA), and cerebral infarction without residual deficits: Secondary | ICD-10-CM | POA: Diagnosis not present

## 2017-04-01 DIAGNOSIS — I2510 Atherosclerotic heart disease of native coronary artery without angina pectoris: Secondary | ICD-10-CM | POA: Diagnosis not present

## 2017-04-01 DIAGNOSIS — Z79899 Other long term (current) drug therapy: Secondary | ICD-10-CM | POA: Diagnosis not present

## 2017-04-01 DIAGNOSIS — I10 Essential (primary) hypertension: Secondary | ICD-10-CM | POA: Insufficient documentation

## 2017-04-01 DIAGNOSIS — R1032 Left lower quadrant pain: Secondary | ICD-10-CM | POA: Diagnosis present

## 2017-04-01 DIAGNOSIS — K5732 Diverticulitis of large intestine without perforation or abscess without bleeding: Secondary | ICD-10-CM | POA: Diagnosis not present

## 2017-04-01 LAB — URINALYSIS, COMPLETE (UACMP) WITH MICROSCOPIC
BACTERIA UA: NONE SEEN
Bilirubin Urine: NEGATIVE
Glucose, UA: NEGATIVE mg/dL
Hgb urine dipstick: NEGATIVE
Ketones, ur: NEGATIVE mg/dL
Nitrite: NEGATIVE
PROTEIN: 30 mg/dL — AB
RBC / HPF: NONE SEEN RBC/hpf (ref 0–5)
SPECIFIC GRAVITY, URINE: 1.027 (ref 1.005–1.030)
pH: 5 (ref 5.0–8.0)

## 2017-04-01 LAB — COMPREHENSIVE METABOLIC PANEL
ALBUMIN: 4.2 g/dL (ref 3.5–5.0)
ALT: 17 U/L (ref 17–63)
AST: 19 U/L (ref 15–41)
Alkaline Phosphatase: 50 U/L (ref 38–126)
Anion gap: 8 (ref 5–15)
BILIRUBIN TOTAL: 1 mg/dL (ref 0.3–1.2)
BUN: 17 mg/dL (ref 6–20)
CHLORIDE: 102 mmol/L (ref 101–111)
CO2: 28 mmol/L (ref 22–32)
Calcium: 9.4 mg/dL (ref 8.9–10.3)
Creatinine, Ser: 1.35 mg/dL — ABNORMAL HIGH (ref 0.61–1.24)
GFR calc Af Amer: >60 mL/min (ref >60)
GFR calc non Af Amer: 55 mL/min — ABNORMAL LOW (ref >60)
GLUCOSE: 123 mg/dL — AB (ref 65–99)
POTASSIUM: 4.8 mmol/L (ref 3.5–5.1)
Sodium: 138 mmol/L (ref 135–145)
Total Protein: 7.7 g/dL (ref 6.5–8.1)

## 2017-04-01 LAB — CBC
HEMATOCRIT: 40.5 % (ref 40.0–52.0)
Hemoglobin: 13.6 g/dL (ref 13.0–18.0)
MCH: 32 pg (ref 26.0–34.0)
MCHC: 33.7 g/dL (ref 32.0–36.0)
MCV: 95 fL (ref 80.0–100.0)
Platelets: 228 10*3/uL (ref 150–440)
RBC: 4.26 MIL/uL — ABNORMAL LOW (ref 4.40–5.90)
RDW: 12.9 % (ref 11.5–14.5)
WBC: 12.6 10*3/uL — ABNORMAL HIGH (ref 3.8–10.6)

## 2017-04-01 LAB — LIPASE, BLOOD: LIPASE: 33 U/L (ref 11–51)

## 2017-04-01 MED ORDER — CIPROFLOXACIN HCL 500 MG PO TABS: 500.0000 mg | ORAL_TABLET | Freq: Two times a day (BID) | ORAL | 0 refills | Status: DC

## 2017-04-01 MED ORDER — SODIUM CHLORIDE 0.9 % IV BOLUS (SEPSIS)
1000.0000 mL | Freq: Once | INTRAVENOUS | Status: AC
  Administered 2017-04-01: 1000 mL via INTRAVENOUS

## 2017-04-01 MED ORDER — IOPAMIDOL (ISOVUE-300) INJECTION 61%
100.0000 mL | Freq: Once | INTRAVENOUS | Status: AC | PRN
  Administered 2017-04-01: 100 mL via INTRAVENOUS

## 2017-04-01 MED ORDER — METRONIDAZOLE 500 MG PO TABS: 500.0000 mg | ORAL_TABLET | Freq: Three times a day (TID) | ORAL | 0 refills | Status: DC

## 2017-04-01 NOTE — Telephone Encounter (Signed)
Patients wife LVM and stated Sajad finished his medication and has been in severe pain last night and woke up  with a fever this morning. Please call.

## 2017-04-01 NOTE — ED Triage Notes (Signed)
Pt states was seen at [facility] ER on 12/23 and was evaluated for mesenteric ischemia, was dx with diverticulitis, prescribed Cipro and Flagyl, pt states took medications as prescribed and followed up with GI. Pt states last pain returned at approx 1210, c/o LLQ abdominal pain that radiates to L flank. Pt states pain with palpation. Pt denies N/V/D at this time, but states that his stool have been soft, denies blood in his stools at this time.

## 2017-04-01 NOTE — Discharge Instructions (Signed)
Follow-up with your gastroenterologist this week, preferabbly Wednesday this week.  Please return to the emergency room right away if you are to develop a fever, severe nausea, your pain becomes severe or worsens, you are unable to keep food down, begin vomiting any dark or bloody fluid, you develop any dark or bloody stools, feel dehydrated, or other new concerns or symptoms arise.

## 2017-04-01 NOTE — ED Provider Notes (Signed)
[facility] Emergency Department Provider Note   ____________________________________________   First MD Initiated Contact with Patient 04/01/17 1259     (approximate)  I have reviewed the triage vital signs and the nursing notes.   HISTORY  Chief Complaint Abdominal Pain    HPI Kevin [name] is a 61 y.o. male recently diagnosed and treated for diverticulitis about 1 week ago.  Reports he had severe abdominal pain at that time, has gotten better but yesterday began experiencing a discomfort in the left mid to the lower abdomen again.  Denies nausea or vomiting.  No fevers or chills.  Some slight looser stools over the last week, but no significant change in somewhat dark in appearance at times.  No blood in stool.  No vomiting.  Does take a baby aspirin daily.  No chest pain or trouble breathing.  No fevers or chills.  Reports an ongoing moderate pain in the left flank worsened with pressing on it.   Past Medical History:  Diagnosis Date  . ASD (atrial septal defect)   . Brachial plexopathy   . CAD (coronary artery disease)    s/p CABG  . Coronary artery disease    a. s/p 4 vessel CABG in 05/2008 w/ LIMA-LAD, SVG-Diag, sequential SVG-OM1/OM2; b. s/p PCI/DES x 2 to LAD in 10/2009; c. LHC 03/2013 stable disease and patent LAD stents  . H/O maze procedure 05/17/08   closure of ASD  . Hyperlipidemia   . 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   . Hypertension   . OSA (obstructive sleep apnea)   . Overweight(278.02)   . PAF (paroxysmal atrial fibrillation) ([place])    a. s/p Maze 05/2008, previously on Eliquis->discontinued 05/2016; c. CHADS2VASc => 4 (HTN, stroke x 2, vascular disease)  . Patent foramen ovale    s/p closure  . Psoriatic arthritis ([place])   . Sleep apnea   . Stroke [facility])    right brain, 07/2005,left brain, 05/2005  . Thrombocytopenia [facility])     Patient Active Problem List   Diagnosis Date Noted  . Unstable  angina ([place]) 11/20/2016  . MCI (mild cognitive impairment) 11/14/2016  . Diverticulosis of colon 06/14/2016  . Obstructive sleep apnea 09/22/2015  . BP (high blood pressure) 09/22/2015  . Allergic rhinitis 09/22/2015  . Anxiety 09/22/2015  . 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[place]) 05/14/2014  . 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 NSVT (nonsustained ventricular tachycardia) ([place]) 04/07/2013  . 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 Memory problem 02/12/2012  . Cerebrovascular accident (CVA) ([place]) 06/21/2010  . Atherosclerosis of coronary artery bypass graft of native heart with stable angina pectoris ([place]) 05/13/2009  . Hyperlipidemia 08/11/2008  . Obesity 08/11/2008  . THROMBOCYTOPENIA 08/11/2008  . Coronary atherosclerosis 08/11/2008  . ATRIAL FIBRILLATION 08/11/2008  . PATENT FORAMEN OVALE 08/11/2008    Past Surgical History:  Procedure Laterality Date  . ACUTE PANCREATITIS  5/11  . APPENDECTOMY  1984  . CHOLECYSTECTOMY  08/19/2009  . CORONARY ARTERY BYPASS GRAFT  05/12/2008   x4 by PeterVan Trigt,MD  . LEFT HEART CATH AND CORONARY ANGIOGRAPHY N/A 11/29/2016   Procedure: LEFT HEART CATH AND CORONARY ANGIOGRAPHY;  Surgeon: Wellington Hampshire, MD;  Location: [place] CV LAB;  Service: Cardiovascular;  Laterality: N/A;  . PATENT FORAMEN OVALE CLOSURE    . STINTS  8/11   X2  . TONSILLECTOMY     AS A CHILD    Prior to Admission medications  Medication Sig Start Date End Date Taking? Authorizing Provider  ALPRAZolam Duanne Moron) 0.5 MG tablet Take 1-2 tablets (0.5-1 mg total) by mouth at bedtime. Patient taking differently: Take 0.5 mg by mouth at bedtime.  10/04/16   Carmon Ginsberg, PA  aspirin EC 81 MG tablet Take 81 mg by mouth daily.    [provider]  cetirizine (ZYRTEC) 10 MG tablet Take 10 mg by mouth at bedtime.     [provider]  ciprofloxacin (CIPRO) 500 MG tablet Take 1 tablet (500 mg total) by mouth 2 (two) times daily. 04/01/17   Delman Kitten, MD  clobetasol  ointment (TEMOVATE) 6.71 % Apply 1 application topically 2 (two) times daily as needed (for psoriasis).    [provider]  clopidogrel (PLAVIX) 75 MG tablet Take 1 tablet (75 mg total) by mouth daily. 05/15/16   Minna Merritts, MD  desonide (DESOWEN) 0.05 % cream Apply 1 application topically 2 (two) times daily as needed (for psoriasis).    [provider]  escitalopram (LEXAPRO) 20 MG tablet Take 1 tablet (20 mg total) by mouth daily. 03/21/17   Carmon Ginsberg, PA  ezetimibe (ZETIA) 10 MG tablet Take 1 tablet (10 mg total) by mouth daily. Patient taking differently: Take 10 mg by mouth at bedtime.  05/15/16 03/24/17  Minna Merritts, MD  isosorbide mononitrate (IMDUR) 30 MG 24 hr tablet TAKE ONE TABLET BY MOUTH DAILY Patient taking differently: TAKE ONE TABLET (30mg ) BY MOUTH DAILY AT BEDTIME 08/14/16   Minna Merritts, MD  metoprolol tartrate (LOPRESSOR) 25 MG tablet Take 1 tablet (25 mg total) by mouth 2 (two) times daily. 11/20/16 03/24/17  Rise Mu, PA-C  metroNIDAZOLE (FLAGYL) 500 MG tablet Take 1 tablet (500 mg total) by mouth 3 (three) times daily. 04/01/17   Delman Kitten, MD  nitroGLYCERIN (NITROSTAT) 0.4 MG SL tablet Place 1 tablet (0.4 mg total) under the tongue every 5 (five) minutes as needed. May repeat for up to 3 doses. 05/15/16   Gollan, Kathlene November, MD  RANEXA 1000 MG SR tablet TAKE 1 TABLET BY MOUTH 2 TIMES DAILY Patient taking differently: TAKE 1 TABLET (1000mg ) BY MOUTH 2 TIMES DAILY 12/11/16   Minna Merritts, MD  ranitidine (ZANTAC) 300 MG tablet TAKE 1 TABLET BY MOUTH ONCE DAILY 03/25/17   Carmon Ginsberg, PA  rosuvastatin (CRESTOR) 40 MG tablet TAKE 1 TABLET (40 MG TOTAL) BY MOUTH DAILY. Patient taking differently: TAKE 1 TABLET (40 MG TOTAL) BY MOUTH DAILY AT BEDTIME. 09/03/16   Gollan, Kathlene November, MD    Allergies Strawberry extract and Dilaudid [hydromorphone hcl]  Family History  Problem Relation Age of Onset  . Diabetes Sister   . Ovarian  cancer Sister   . Psychiatric Illness Father   . Heart attack Father   . Diabetes Other   . Hypertension Mother   . Heart disease Mother   . Lung cancer Paternal Aunt   . Coronary artery disease Other   . Diabetes Other   . Hypertension Other   . Hyperlipidemia Other   . Cancer Paternal Uncle   . Heart disease Paternal Grandmother   . Heart disease Paternal Grandfather     Social History Social History   Tobacco Use  . Smoking status: Never Smoker  . Smokeless tobacco: Never Used  Substance Use Topics  . Alcohol use: No  . Drug use: No    Review of Systems Constitutional: No fever/chills Eyes: No visual changes. ENT: No sore throat. Cardiovascular:  Denies chest pain. Respiratory: Denies shortness of breath. Gastrointestinal:   No constipation. Genitourinary: Negative for dysuria. Musculoskeletal: Negative for back pain. Skin: Negative for rash. Neurological: Negative for headaches, focal weakness or numbness.    ____________________________________________   PHYSICAL EXAM:  VITAL SIGNS: ED Triage Vitals  Enc Vitals Group     BP 04/01/17 1144 100/69     Pulse Rate 04/01/17 1144 65     Resp 04/01/17 1144 14     Temp 04/01/17 1144 98.5 F (36.9 C)     Temp Source 04/01/17 1144 Oral     SpO2 04/01/17 1144 98 %     Weight 04/01/17 1145 182 lb (82.6 kg)     Height 04/01/17 1145 5\' 6"  (1.676 m)     Head Circumference --      Peak Flow --      Pain Score 04/01/17 1144 0     Pain Loc --      Pain Edu? --      Excl. in [place]? --     Constitutional: Alert and oriented. Well appearing and in no acute distress.  Patient and his wife both very pleasant. Eyes: Conjunctivae are normal. Head: Atraumatic. Nose: No congestion/rhinnorhea. Mouth/Throat: Mucous membranes are slightly dry. Neck: No stridor.   Cardiovascular: Normal rate, regular rhythm. Grossly normal heart sounds.  Good peripheral circulation. Respiratory: Normal respiratory effort.  No retractions.  Lungs CTAB. Gastrointestinal: Soft and nontender except for focal discomfort in the left upper quadrant and left flank where he reports reproducible he has tenderness, moderate without peritonitis. No distention. Musculoskeletal: No lower extremity tenderness nor edema. Neurologic:  Normal speech and language. No gross focal neurologic deficits are appreciated.  Skin:  Skin is warm, dry and intact. No rash noted. Psychiatric: Mood and affect are normal. Speech and behavior are normal.  ____________________________________________   LABS (all labs ordered are listed, but only abnormal results are displayed)  Labs Reviewed  COMPREHENSIVE METABOLIC PANEL - Abnormal; Notable for the following components:      Result Value   Glucose, Bld 123 (*)    Creatinine, Ser 1.35 (*)    GFR calc non Af Amer 55 (*)    All other components within normal limits  CBC - Abnormal; Notable for the following components:   WBC 12.6 (*)    RBC 4.26 (*)    All other components within normal limits  URINALYSIS, COMPLETE (UACMP) WITH MICROSCOPIC - Abnormal; Notable for the following components:   Color, Urine AMBER (*)    APPearance CLEAR (*)    Protein, ur 30 (*)    Leukocytes, UA TRACE (*)    Squamous Epithelial / LPF 0-5 (*)    All other components within normal limits  LIPASE, BLOOD   ____________________________________________  EKG   ____________________________________________  RADIOLOGY   abdomen pelvis reviewed by me, diverticulitis of the descending colon now noted Ct Abdomen Pelvis W Contrast  Result Date: 04/01/2017 CLINICAL DATA:  Left lower quadrant pain with elevated white blood cell count. History of diverticulitis. Evaluate for perforation. EXAM: CT ABDOMEN AND PELVIS WITH CONTRAST TECHNIQUE: Multidetector CT imaging of the abdomen and pelvis was performed using the standard protocol following bolus administration of intravenous contrast. CONTRAST:  134mL ISOVUE-300 IOPAMIDOL  (ISOVUE-300) INJECTION 61% COMPARISON:  03/24/2017 FINDINGS: Lower chest: Left lower lobe scarring. Mild cardiomegaly with distal right coronary artery atherosclerosis. Hepatobiliary: Normal liver. Cholecystectomy, without biliary ductal dilatation. Pancreas: Normal, without mass or ductal dilatation. Spleen: Normal in size, without focal  abnormality. Adrenals/Urinary Tract: Normal adrenal glands. Normal kidneys, without hydronephrosis. Normal urinary bladder. Stomach/Bowel: Normal stomach, without wall thickening. Colonic stool burden suggests constipation. Scattered colonic diverticula. Sigmoid diverticulosis is significantly improved, nearly resolved. There may be minimal pericolonic edema in this region on image 61/series 2. However, there is new moderate inflammation surrounding the mid to distal descending colon, including on image 42/series 2. No free perforation or pericolonic abscess. Periampullary duodenal diverticulum.  Otherwise normal small bowel. Vascular/Lymphatic: Aortic and branch vessel atherosclerosis. No abdominopelvic adenopathy. Reproductive: Normal prostate. Other: No significant free fluid. Tiny fat containing left inguinal hernia. Musculoskeletal: Degenerative disc disease at the lumbosacral junction with prominent disc bulge at L4-5. IMPRESSION: 1. Moderate non complicated descending diverticulitis. Improved, nearly resolved sigmoid diverticulitis. 2. Coronary artery atherosclerosis. Aortic Atherosclerosis (ICD10-I70.0). 3.  Possible constipation. Electronically Signed   By: Abigail Miyamoto [name]   On: 04/01/2017 14:25    ____________________________________________   PROCEDURES  Procedure(s) performed: None  Procedures  Critical Care performed: No  ____________________________________________   INITIAL IMPRESSION / ASSESSMENT AND PLAN / ED COURSE  Pertinent labs & imaging results that were available during my care of the patient were reviewed by me and considered in my  medical decision making (see chart for details).  Differential diagnosis includes but is not limited to, abdominal perforation, aortic dissection, cholecystitis, appendicitis, diverticulitis, colitis, esophagitis/gastritis, kidney stone, pyelonephritis, urinary tract infection, aortic aneurysm. All are considered in decision and treatment plan. Based upon the patient's presentation and risk factors, given his recent diagnosis of diverticulitis for which it sounds his symptoms were improving I do believe and discussed with patient and his wife repeat CT scan to assure he has not developed any abscess or small perforation especially given his discomfort is left-sided in nature with known recent sigmoid diverticulitis.  He otherwise appears quite well, reassuring abdominal examination.  Stable hemodynamics.  He may be slightly dehydrated with dry mucous membranes and recent illness I will hydrate him.  Did not wish for any pain medicine or anti-nausea medication at this time.     Discussed CT, clinical history with Dr. Allen Norris of gastroenterology, he advises re-prescribing Cipro and Flagyl for a longer course.  Otherwise ongoing outpatient follow-up closely with his GI clinic.  Discussed with the patient, both he and his wife are in agreement with this plan will follow up with his gastroenterologist    ____________________________________________   FINAL CLINICAL IMPRESSION(S) / ED DIAGNOSES  Final diagnoses:  Diverticulitis of large intestine without perforation or abscess without bleeding      NEW MEDICATIONS STARTED DURING THIS VISIT:  This SmartLink is deprecated. Use AVSMEDLIST instead to display the medication list for a patient.   Note:  This document was prepared using Dragon voice recognition software and may include unintentional dictation errors.     Delman Kitten, MD 04/01/17 2018

## 2017-04-01 NOTE — Telephone Encounter (Signed)
Contacted pt's wife [name]) of whom pt prefers we talk to. She states pt completed ATB and Flagyl yesterday (03/31/17). He was severe pain for 6hrs last night. Same pain as before but is now moving around his back. Does not have a thermometer but pt having cold sweats, felt warm. Has been in bed most of weekend. She is unsure if he is having loose stools, no nausea/vomiting. 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 Pain decreased enough so he could get up.  She was advised either pt could go to ED or wait until Wednesday 04/03/2017 and we will speak to Dr. Daphane Shepherd and have pt get CBC today. She was informed that Dr. Durwin Reges is on call.  Pt. Will meet husband at ED as she works for [employer].

## 2017-04-01 NOTE — ED Notes (Signed)
Patient transported to CT 

## 2017-04-03 ENCOUNTER — Telehealth: Payer: Self-pay

## 2017-04-03 NOTE — Telephone Encounter (Signed)
Patient's wife states Lexapro RX was sent to [facility] instead of [facility].

## 2017-04-04 ENCOUNTER — Other Ambulatory Visit: Payer: Self-pay | Admitting: Family Medicine

## 2017-04-04 MED ORDER — ESCITALOPRAM OXALATE 20 MG PO TABS: 20.0000 mg | ORAL_TABLET | Freq: Every day | ORAL | 3 refills | Status: DC

## 2017-04-04 NOTE — Telephone Encounter (Signed)
done

## 2017-04-10 ENCOUNTER — Encounter: Payer: Self-pay | Admitting: Gastroenterology

## 2017-04-10 ENCOUNTER — Ambulatory Visit: Payer: 59 | Admitting: Gastroenterology

## 2017-04-10 ENCOUNTER — Other Ambulatory Visit: Payer: Self-pay

## 2017-04-10 VITALS — BP 110/72 | HR 67 | Ht 65.0 in | Wt 188.8 lb

## 2017-04-10 DIAGNOSIS — K5732 Diverticulitis of large intestine without perforation or abscess without bleeding: Secondary | ICD-10-CM

## 2017-04-10 NOTE — Progress Notes (Signed)
Kevin Antigua, MD [address]  [place], [place] [address]  Main: [phone]  Fax: [phone]   Primary Care Physician: Carmon Ginsberg, [place]  Primary Gastroenterologist:  Dr. Vonda [name]  Chief Complaint  Patient presents with  . Follow-up    ER f/u abdominal pain, diverticulitis/constipation    HPI: Kevin [name] is a 62 y.o. male here for post ER follow-up.  Patient was initially diagnosed with diverticulitis on December 23 when he went there for abdominal pain.  He was treated conservatively with antibiotics and IV fluids and did not need hospital admission.  He saw me on December 28 in the clinic and abdominal pain had improved at that time.  However, he went back to the ER on December 31 due to recurrent abdominal pain in the left lower quadrant, sharp, 8/10, not associated with nausea vomiting associated with altered bowel habits.  Lasted for 1-2 days.  CT scan was repeated during the visit, and reported the previous sigmoid diverticulitis to be improved.  However "there is new moderate inflammation surrounding the distal descending colon".  No free perforation or pericolonic abscess was reported.  As per ER note, the patient was discussed with Dr. Allen Norris who recommended an extended course of antibiotics, and patient was discharged from the ER with the same.  Patient is still on antibiotics and is feeling better.  Reports mild left lower quadrant discomfort intermittently, but much improved from before.  Denies any fever chills.  Reports normal appetite.  Denies any constipation, reports 1-2 soft bowel movements daily.  Current Outpatient Medications  Medication Sig Dispense Refill  . ALPRAZolam (XANAX) 0.5 MG tablet Take 1-2 tablets (0.5-1 mg total) by mouth at bedtime. (Patient taking differently: Take 0.5 mg by mouth at bedtime. ) 60 tablet 5  . aspirin EC 81 MG tablet Take 81 mg by mouth daily.    . cetirizine (ZYRTEC) 10 MG tablet Take 10 mg by mouth  at bedtime.     . ciprofloxacin (CIPRO) 500 MG tablet Take 1 tablet (500 mg total) by mouth 2 (two) times daily. 20 tablet 0  . clobetasol ointment (TEMOVATE) 5.64 % Apply 1 application topically 2 (two) times daily as needed (for psoriasis).    . clopidogrel (PLAVIX) 75 MG tablet Take 1 tablet (75 mg total) by mouth daily. 90 tablet 3  . desonide (DESOWEN) 0.05 % cream Apply 1 application topically 2 (two) times daily as needed (for psoriasis).    Marland Kitchen escitalopram (LEXAPRO) 20 MG tablet Take 1 tablet (20 mg total) by mouth daily. 90 tablet 3  . isosorbide mononitrate (IMDUR) 30 MG 24 hr tablet TAKE ONE TABLET BY MOUTH DAILY (Patient taking differently: TAKE ONE TABLET (30mg ) BY MOUTH DAILY AT BEDTIME) 90 tablet 3  . metroNIDAZOLE (FLAGYL) 500 MG tablet Take 1 tablet (500 mg total) by mouth 3 (three) times daily. 30 tablet 0  . nitroGLYCERIN (NITROSTAT) 0.4 MG SL tablet Place 1 tablet (0.4 mg total) under the tongue every 5 (five) minutes as needed. May repeat for up to 3 doses. 25 tablet 6  . RANEXA 1000 MG SR tablet TAKE 1 TABLET BY MOUTH 2 TIMES DAILY (Patient taking differently: TAKE 1 TABLET (1000mg ) BY MOUTH 2 TIMES DAILY) 180 tablet 3  . ranitidine (ZANTAC) 300 MG tablet TAKE 1 TABLET BY MOUTH ONCE DAILY 90 tablet 3  . rosuvastatin (CRESTOR) 40 MG tablet TAKE 1 TABLET (40 MG TOTAL) BY MOUTH DAILY. (Patient taking differently: TAKE 1 TABLET (40  MG TOTAL) BY MOUTH DAILY AT BEDTIME.) 90 tablet 3  . ezetimibe (ZETIA) 10 MG tablet Take 1 tablet (10 mg total) by mouth daily. (Patient taking differently: Take 10 mg by mouth at bedtime. ) 90 tablet 3  . metoprolol tartrate (LOPRESSOR) 25 MG tablet Take 1 tablet (25 mg total) by mouth 2 (two) times daily. 180 tablet 3   No current facility-administered medications for this visit.     Allergies as of 04/10/2017 - Review Complete 04/10/2017  Allergen Reaction Noted  . Strawberry extract Anaphylaxis 12/04/2011  . Dilaudid [hydromorphone hcl] Nausea And  Vomiting 06/21/2010    ROS:  General: Negative for anorexia, weight loss, fever, chills, fatigue, weakness. ENT: Negative for hoarseness, difficulty swallowing , nasal congestion. CV: Negative for chest pain, angina, palpitations, dyspnea on exertion, peripheral edema.  Respiratory: Negative for dyspnea at rest, dyspnea on exertion, cough, sputum, wheezing.  GI: See history of present illness. GU:  Negative for dysuria, hematuria, urinary incontinence, urinary frequency, nocturnal urination.  Endo: Negative for unusual weight change.    Physical Examination:   BP 110/72   Pulse 67   Ht 5\' 5"  (1.651 m)   Wt 188 lb 12.8 oz (85.6 kg)   BMI 31.42 kg/m   General: Well-nourished, well-developed in no acute distress.  Eyes: No icterus. Conjunctivae pink. Mouth: Oropharyngeal mucosa moist and pink , no lesions erythema or exudate. Lungs: Clear to auscultation bilaterally. Non-labored. Heart: Regular rate and rhythm, no murmurs rubs or gallops.  Abdomen: Bowel sounds are normal, mildly tender to palpation bilateral lower quadrant, nondistended, no hepatosplenomegaly or masses, no abdominal bruits or hernia , no rebound or guarding.   Extremities: No lower extremity edema. No clubbing or deformities. Neuro: Alert and oriented x 3.  Grossly intact. Skin: Warm and dry, no jaundice.   Psych: Alert and cooperative, normal mood and affect.   Labs: CMP     Component Value Date/Time   NA 138 04/01/2017 1146   NA 142 06/14/2016 1156   NA 141 03/18/2013 1739   K 4.8 04/01/2017 1146   K 4.0 03/18/2013 1739   CL 102 04/01/2017 1146   CL 107 03/18/2013 1739   CO2 28 04/01/2017 1146   CO2 30 03/18/2013 1739   GLUCOSE 123 (H) 04/01/2017 1146   GLUCOSE 79 03/18/2013 1739   BUN 17 04/01/2017 1146   BUN 18 06/14/2016 1156   BUN 23 (H) 03/18/2013 1739   CREATININE 1.35 (H) 04/01/2017 1146   CREATININE 1.50 (H) 12/18/2013 1008   CALCIUM 9.4 04/01/2017 1146   CALCIUM 9.2 03/18/2013 1739    PROT 7.7 04/01/2017 1146   PROT 6.9 06/14/2016 1156   ALBUMIN 4.2 04/01/2017 1146   ALBUMIN 4.4 06/14/2016 1156   AST 19 04/01/2017 1146   ALT 17 04/01/2017 1146   ALKPHOS 50 04/01/2017 1146   BILITOT 1.0 04/01/2017 1146   BILITOT 0.6 06/14/2016 1156   GFRNONAA 55 (L) 04/01/2017 1146   GFRNONAA 50 (L) 12/18/2013 1008   GFRAA >60 04/01/2017 1146   GFRAA 58 (L) 12/18/2013 1008   Lab Results  Component Value Date   WBC 12.6 (H) 04/01/2017   HGB 13.6 04/01/2017   HCT 40.5 04/01/2017   MCV 95.0 04/01/2017   PLT 228 04/01/2017    Imaging Studies: Ct Abdomen Pelvis W Contrast  Result Date: 04/01/2017 CLINICAL DATA:  Left lower quadrant pain with elevated white blood cell count. History of diverticulitis. Evaluate for perforation. EXAM: CT ABDOMEN AND PELVIS WITH CONTRAST TECHNIQUE:  Multidetector CT imaging of the abdomen and pelvis was performed using the standard protocol following bolus administration of intravenous contrast. CONTRAST:  167mL ISOVUE-300 IOPAMIDOL (ISOVUE-300) INJECTION 61% COMPARISON:  03/24/2017 FINDINGS: Lower chest: Left lower lobe scarring. Mild cardiomegaly with distal right coronary artery atherosclerosis. Hepatobiliary: Normal liver. Cholecystectomy, without biliary ductal dilatation. Pancreas: Normal, without mass or ductal dilatation. Spleen: Normal in size, without focal abnormality. Adrenals/Urinary Tract: Normal adrenal glands. Normal kidneys, without hydronephrosis. Normal urinary bladder. Stomach/Bowel: Normal stomach, without wall thickening. Colonic stool burden suggests constipation. Scattered colonic diverticula. Sigmoid diverticulosis is significantly improved, nearly resolved. There may be minimal pericolonic edema in this region on image 61/series 2. However, there is new moderate inflammation surrounding the mid to distal descending colon, including on image 42/series 2. No free perforation or pericolonic abscess. Periampullary duodenal diverticulum.   Otherwise normal small bowel. Vascular/Lymphatic: Aortic and branch vessel atherosclerosis. No abdominopelvic adenopathy. Reproductive: Normal prostate. Other: No significant free fluid. Tiny fat containing left inguinal hernia. Musculoskeletal: Degenerative disc disease at the lumbosacral junction with prominent disc bulge at L4-5. IMPRESSION: 1. Moderate non complicated descending diverticulitis. Improved, nearly resolved sigmoid diverticulitis. 2. Coronary artery atherosclerosis. Aortic Atherosclerosis (ICD10-I70.0). 3.  Possible constipation. Electronically Signed   By: Abigail Miyamoto [name]   On: 04/01/2017 14:25   Ct Abdomen Pelvis W Contrast  Result Date: 03/24/2017 CLINICAL DATA:  62 year old male with abdominal pain. EXAM: CT ABDOMEN AND PELVIS WITH CONTRAST TECHNIQUE: Multidetector CT imaging of the abdomen and pelvis was performed using the standard protocol following bolus administration of intravenous contrast. CONTRAST:  178mL ISOVUE-300 IOPAMIDOL (ISOVUE-300) INJECTION 61% COMPARISON:  Abdominal radiograph dated 03/24/2017 and CT 12/18/2013 FINDINGS: Lower chest: The visualized lung bases are clear. There is coronary vascular calcification. No intra-abdominal free air or free fluid. Hepatobiliary: L cholecystectomy. No intrahepatic biliary ductal dilatation. The liver is unremarkable. Pancreas: Unremarkable. No pancreatic ductal dilatation or surrounding inflammatory changes. Spleen: Normal in size without focal abnormality. Adrenals/Urinary Tract: Adrenal glands are unremarkable. Kidneys are normal, without renal calculi, focal lesion, or hydronephrosis. Bladder is unremarkable. Stomach/Bowel: There is sigmoid diverticulosis and scattered colonic diverticula. There is active inflammatory changes involving a diverticulum in the sigmoid colon consistent with acute diverticulitis. There is no diverticular perforation or abscess. There is a 1.5 cm duodenal diverticulum at the head of the pancreas. There  is no bowel obstruction. Appendectomy. Vascular/Lymphatic: Mild aortoiliac atherosclerotic disease. The abdominal aorta and IVC are otherwise unremarkable. No portal venous gas. There is no adenopathy. Reproductive: The prostate and seminal vesicles are grossly unremarkable. Other: None Musculoskeletal: Mild degenerative changes of the spine most prominent at L5-S1 with disc desiccation and vacuum phenomena. No acute osseous pathology. IMPRESSION: 1. Sigmoid diverticulitis.  No diverticular abscess or perforation. 2.  Aortic Atherosclerosis (ICD10-I70.0). Electronically Signed   By: Anner Crete [name]   On: 03/24/2017 19:11   Dg Abdomen Acute W/chest  Result Date: 03/24/2017 CLINICAL DATA:  Abdominal pain 4 days worse at night. EXAM: DG ABDOMEN ACUTE W/ 1V CHEST COMPARISON:  Chest x-ray 03/18/2013 and CT 12/18/2013 FINDINGS: Sternotomy wires are present. Lungs are adequately inflated and otherwise clear. Cardiomediastinal silhouette and remainder of the chest is unchanged. Abdominopelvic images demonstrate surgical clips over the right upper quadrant. Bowel gas pattern is nonobstructive. There is no free peritoneal air. Minimal degenerate change of the spine and hips. IMPRESSION: Negative abdominal radiographs.  No acute cardiopulmonary disease. Electronically Signed   By: Marin Olp [name]   On: 03/24/2017 18:33    Assessment  and Plan:   Faaris Arizpe is a 62 y.o. y/o male with recent history of diverticulitis initially diagnosed on December 23 in the sigmoid colon, with improved pain after outpatient antibiotics, but repeated pain on December 31 with repeat CT showing new moderate inflammation in the descending colon, and improved sigmoid diverticulitis.  Patient continuing to improve with new course of antibiotics given in the ER on December 31 No previous history of colonoscopy Needs colonoscopy both for screening and due to recent diverticulitis We will need to wait 6-8 weeks prior to  scheduling as risks of perforation and risks of colonoscopy in general increase in the setting of active inflammation from diverticulitis. Patient is continuing to improve.  He has increase his water intake which is appropriate.  I have asked him to maintain soft stools.  If he starts to have constipation I have asked him to maintain a high-fiber diet and use Metamucil or MiraLAX if needed. If pain does not improve or recurs, I have asked him to contact us or go to the ER for the same.   Dr Kevin [name]

## 2017-04-11 ENCOUNTER — Ambulatory Visit: Payer: 59 | Admitting: Physician Assistant

## 2017-04-11 ENCOUNTER — Telehealth: Payer: Self-pay

## 2017-04-11 NOTE — Telephone Encounter (Signed)
Spoke with patient.  He requested for me to call Collie Siad (wife) at [phone] to schedule colonoscopy.  I left a message on her voicemail to call our office and set up his procedure for Colonoscopy (K57.92 and R10.84).  Per Dr. Bonna Gains do in 2-3 months.

## 2017-04-12 ENCOUNTER — Telehealth: Payer: Self-pay | Admitting: Gastroenterology

## 2017-04-12 ENCOUNTER — Other Ambulatory Visit: Payer: Self-pay

## 2017-04-12 DIAGNOSIS — K5732 Diverticulitis of large intestine without perforation or abscess without bleeding: Secondary | ICD-10-CM

## 2017-04-12 MED ORDER — NA SULFATE-K SULFATE-MG SULF 17.5-3.13-1.6 GM/177ML PO SOLN: 1.0000 | Freq: Once | ORAL | 0 refills | Status: AC

## 2017-04-12 NOTE — Telephone Encounter (Signed)
Please call wife to schedule colonoscopy.

## 2017-04-12 NOTE — Telephone Encounter (Signed)
Please call patients wife [name] to schedule colonoscopy

## 2017-04-15 ENCOUNTER — Telehealth: Payer: Self-pay | Admitting: Cardiovascular Disease

## 2017-04-15 ENCOUNTER — Other Ambulatory Visit: Payer: Self-pay | Admitting: Family Medicine

## 2017-04-15 DIAGNOSIS — F419 Anxiety disorder, unspecified: Secondary | ICD-10-CM

## 2017-04-15 NOTE — Telephone Encounter (Signed)
° °  [facility] Pre-operative Risk Assessment    Request for surgical clearance:  1. What type of surgery is being performed? Not Listed   2. When is this surgery scheduled? 06/07/17  3. Are there any medications that need to be held prior to surgery and how long?Plavix   4. Practice name and name of physician performing surgery? Not listed  5. What is your office phone and fax number? [facility] Phone [phone] Fax [phone]  6. Anesthesia type (None, local, MAC, general) ? Not Listed   _______________________________________________

## 2017-04-15 NOTE — Telephone Encounter (Signed)
Please See Note Below

## 2017-04-15 NOTE — Telephone Encounter (Signed)
Please call in xanax as request and update the EMR.

## 2017-04-16 ENCOUNTER — Encounter: Payer: Self-pay | Admitting: Adult Health

## 2017-04-16 NOTE — Progress Notes (Unsigned)
   Primary Cardiologist: No primary care provider on file.  Chart reviewed as part of pre-operative protocol coverage. Given past medical history and time since last visit, based on ACC/AHA guidelines, Kevin [name] would be at acceptable risk for the planned procedure without further cardiovascular testing.   He is currently on Plavix, and will need to hold his Plavix for 5-7 days prior to colonoscopy if polypectomy may be required.  I have spoken with the patient by phone and have explained this to him as well as checked on his overall status.  I will route this recommendation to the requesting party via Epic fax function and remove from pre-op pool.  Please call with questions.  Jory Sims DNP 04/16/2017, 2:27 PM

## 2017-04-17 NOTE — Telephone Encounter (Signed)
   Primary Cardiologist: Ida Rogue, MD  Chart reviewed as part of pre-operative protocol coverage. Patient was contacted 04/17/2017 in reference to pre-operative risk assessment for pending surgery as outlined below.  Kevin [name] was last seen on 12/10/16 by Dr. Rockey Situ.  Since that day, Kevin [name] has done well.  DASI is 7.59 Mets.   Therefore, based on ACC/AHA guidelines, the patient would be at acceptable risk for the planned procedure without further cardiovascular testing. The patient is schedule for Colonoscopy however surgical team will be available if complications of perforated bowel.   I will route this conversation to Dr. Rockey Situ to review ASA and plavix and holding directions. Please reply back to  "Cv Div Preop"  Leanor Kail, PA 04/17/2017, 3:33 PM

## 2017-04-17 NOTE — Telephone Encounter (Signed)
Prescription has been called into pharmacy. KW 

## 2017-04-17 NOTE — Telephone Encounter (Signed)
Acceptable risk to hold Plavix 5 days prior to GI procedure Stay on aspirin

## 2017-04-18 NOTE — Telephone Encounter (Signed)
Please fax Dr Donivan Scull recommendations to [place] PA-C 04/18/2017 2:01 PM

## 2017-04-18 NOTE — Telephone Encounter (Signed)
Cardiac clearance faxed to requesting physician.

## 2017-04-19 NOTE — Telephone Encounter (Signed)
Pt's wife, Manuela Schwartz notified that Dr. Donivan Scull (cardiologist) note stated Plavix is to be on hold 5 days prior to GI procedure and stay on ASA. She said they told pt to hold Plavix for 7 days prior to procedure. Wife is going to get clarification from Dr. Rockey Situ. Colonoscopy scheduled for 06/07/2017.

## 2017-04-23 NOTE — Telephone Encounter (Signed)
Cardiologist, Dr. Donivan Scull note from 04/17/17 does say Plavix may be held 5 days prior to procedure.

## 2017-05-20 ENCOUNTER — Other Ambulatory Visit: Payer: Self-pay | Admitting: Cardiovascular Disease

## 2017-05-30 NOTE — Telephone Encounter (Signed)
S/w Collie Siad, she verbalized understanding to hold plavix for 5 days prior to procedure.

## 2017-05-30 NOTE — Telephone Encounter (Signed)
Please call wife to discuss time to hold plavix prior to colonoscopy

## 2017-06-06 ENCOUNTER — Encounter: Payer: Self-pay | Admitting: Student

## 2017-06-07 ENCOUNTER — Ambulatory Visit: Payer: 59 | Admitting: Certified Registered Nurse Anesthetist

## 2017-06-07 ENCOUNTER — Other Ambulatory Visit: Payer: Self-pay | Admitting: Cardiovascular Disease

## 2017-06-07 ENCOUNTER — Ambulatory Visit
Admission: RE | Admit: 2017-06-07 | Discharge: 2017-06-07 | Disposition: A | Discharge status: Home / Self Care | Payer: 59 | Source: Ambulatory Visit | Attending: Gastroenterology | Admitting: Gastroenterology

## 2017-06-07 ENCOUNTER — Encounter
Admission: RE | Disposition: A | Discharge status: Home / Self Care | Payer: Self-pay | Source: Ambulatory Visit | Attending: Gastroenterology

## 2017-06-07 DIAGNOSIS — K5792 Diverticulitis of intestine, part unspecified, without perforation or abscess without bleeding: Secondary | ICD-10-CM | POA: Diagnosis not present

## 2017-06-07 DIAGNOSIS — Z79899 Other long term (current) drug therapy: Secondary | ICD-10-CM | POA: Diagnosis not present

## 2017-06-07 DIAGNOSIS — Z1211 Encounter for screening for malignant neoplasm of colon: Secondary | ICD-10-CM | POA: Diagnosis not present

## 2017-06-07 DIAGNOSIS — I480 Paroxysmal atrial fibrillation: Secondary | ICD-10-CM | POA: Insufficient documentation

## 2017-06-07 DIAGNOSIS — Z951 Presence of aortocoronary bypass graft: Secondary | ICD-10-CM | POA: Insufficient documentation

## 2017-06-07 DIAGNOSIS — Z7982 Long term (current) use of aspirin: Secondary | ICD-10-CM | POA: Diagnosis not present

## 2017-06-07 DIAGNOSIS — I10 Essential (primary) hypertension: Secondary | ICD-10-CM | POA: Diagnosis not present

## 2017-06-07 DIAGNOSIS — K648 Other hemorrhoids: Secondary | ICD-10-CM | POA: Diagnosis not present

## 2017-06-07 DIAGNOSIS — E785 Hyperlipidemia, unspecified: Secondary | ICD-10-CM | POA: Diagnosis not present

## 2017-06-07 DIAGNOSIS — J45909 Unspecified asthma, uncomplicated: Secondary | ICD-10-CM | POA: Diagnosis not present

## 2017-06-07 DIAGNOSIS — G4733 Obstructive sleep apnea (adult) (pediatric): Secondary | ICD-10-CM | POA: Insufficient documentation

## 2017-06-07 DIAGNOSIS — I2510 Atherosclerotic heart disease of native coronary artery without angina pectoris: Secondary | ICD-10-CM | POA: Diagnosis not present

## 2017-06-07 DIAGNOSIS — K5730 Diverticulosis of large intestine without perforation or abscess without bleeding: Secondary | ICD-10-CM

## 2017-06-07 DIAGNOSIS — Z8673 Personal history of transient ischemic attack (TIA), and cerebral infarction without residual deficits: Secondary | ICD-10-CM | POA: Insufficient documentation

## 2017-06-07 DIAGNOSIS — Z955 Presence of coronary angioplasty implant and graft: Secondary | ICD-10-CM | POA: Diagnosis not present

## 2017-06-07 DIAGNOSIS — K5732 Diverticulitis of large intestine without perforation or abscess without bleeding: Secondary | ICD-10-CM

## 2017-06-07 DIAGNOSIS — K5790 Diverticulosis of intestine, part unspecified, without perforation or abscess without bleeding: Secondary | ICD-10-CM | POA: Diagnosis not present

## 2017-06-07 HISTORY — PX: COLONOSCOPY WITH PROPOFOL: SHX5780

## 2017-06-07 SURGERY — COLONOSCOPY WITH PROPOFOL
Anesthesia: General

## 2017-06-07 MED ORDER — SODIUM CHLORIDE 0.9 % IV SOLN
INTRAVENOUS | Status: DC
  Administered 2017-06-07: 08:00:00 via INTRAVENOUS

## 2017-06-07 MED ORDER — PHENYLEPHRINE HCL 10 MG/ML IJ SOLN
INTRAMUSCULAR | Status: DC | PRN
  Administered 2017-06-07: 100 ug via INTRAVENOUS
  Administered 2017-06-07 (×2): 150 ug via INTRAVENOUS
  Administered 2017-06-07 (×2): 100 ug via INTRAVENOUS
  Administered 2017-06-07: 50 ug via INTRAVENOUS
  Administered 2017-06-07: 100 ug via INTRAVENOUS
  Administered 2017-06-07: 150 ug via INTRAVENOUS

## 2017-06-07 MED ORDER — PROPOFOL 500 MG/50ML IV EMUL
INTRAVENOUS | Status: AC
  Filled 2017-06-07: qty 50

## 2017-06-07 MED ORDER — PROPOFOL 500 MG/50ML IV EMUL
INTRAVENOUS | Status: DC | PRN
  Administered 2017-06-07: 150 ug/kg/min via INTRAVENOUS

## 2017-06-07 MED ORDER — EPHEDRINE SULFATE 50 MG/ML IJ SOLN
INTRAMUSCULAR | Status: DC | PRN
  Administered 2017-06-07 (×2): 5 mg via INTRAVENOUS
  Administered 2017-06-07 (×2): 10 mg via INTRAVENOUS
  Administered 2017-06-07: 5 mg via INTRAVENOUS

## 2017-06-07 MED ORDER — PROPOFOL 10 MG/ML IV BOLUS
INTRAVENOUS | Status: DC | PRN
  Administered 2017-06-07: 10 mg via INTRAVENOUS
  Administered 2017-06-07: 70 mg via INTRAVENOUS

## 2017-06-07 MED ORDER — KETOROLAC TROMETHAMINE 30 MG/ML IJ SOLN
INTRAMUSCULAR | Status: DC | PRN
  Administered 2017-06-07: 30 mg via INTRAVENOUS

## 2017-06-07 NOTE — Anesthesia Preprocedure Evaluation (Signed)
Anesthesia Evaluation  Patient identified by MRN, date of birth, ID band Patient awake    Reviewed: Allergy & Precautions, H&P , NPO status , Patient's Chart, lab work & pertinent test results, reviewed documented beta blocker date and time   History of Anesthesia Complications Negative for: history of anesthetic complications  Airway Mallampati: I  TM Distance: >3 FB Neck ROM: full    Dental  (+) Caps, Dental Advidsory Given, Missing   Pulmonary neg shortness of breath, asthma (as a child) , sleep apnea , neg COPD, neg recent URI,           Cardiovascular Exercise Tolerance: Good hypertension, (-) angina+ CAD, + Cardiac Stents and + CABG  (-) Past MI + dysrhythmias (s/p Maze procedure) Atrial Fibrillation (-) Valvular Problems/Murmurs     Neuro/Psych Seizures - (Once), Well Controlled,  PSYCHIATRIC DISORDERS Anxiety CVA, Residual Symptoms negative psych ROS   GI/Hepatic negative GI ROS, Neg liver ROS,   Endo/Other  negative endocrine ROS  Renal/GU negative Renal ROS  negative genitourinary   Musculoskeletal   Abdominal   Peds  Hematology negative hematology ROS (+)   Anesthesia Other Findings Past Medical History: No date: ASD (atrial septal defect) No date: Brachial plexopathy No date: CAD (coronary artery disease)     Comment:  s/p CABG No date: Coronary artery disease     Comment:  a. s/p 4 vessel CABG in 05/2008 w/ LIMA-LAD, SVG-Diag,               sequential SVG-OM1/OM2; b. s/p PCI/DES x 2 to LAD in               10/2009; c. 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  No date: Hypertension No date: OSA (obstructive sleep apnea) No date: Overweight(278.02) No date: PAF (paroxysmal atrial  fibrillation) (HCC)     Comment:  a. s/p Maze 05/2008, previously on Eliquis->discontinued               05/2016; c. CHADS2VASc => 4 (HTN, stroke x 2, vascular               disease) No date: Patent foramen ovale     Comment:  s/p closure No date: Psoriatic arthritis ([place]) No date: Sleep apnea No date: Stroke [facility])     Comment:  right brain, 07/2005,left brain, 05/2005 No date: Thrombocytopenia (HCC)   Reproductive/Obstetrics negative OB ROS                             Anesthesia Physical Anesthesia Plan  ASA: III  Anesthesia Plan: General   Post-op Pain Management:    Induction: Intravenous  PONV Risk Score and Plan: 2 and Propofol infusion  Airway Management Planned: Nasal Cannula  Additional Equipment:   Intra-op Plan:   Post-operative Plan:   Informed Consent: I have reviewed the patients History and Physical, chart, labs and discussed the procedure including the risks, benefits and alternatives for the proposed anesthesia with the patient or authorized representative who has indicated his/her understanding and acceptance.   Dental Advisory Given  Plan Discussed with: Anesthesiologist, CRNA and Surgeon  Anesthesia Plan  Comments:         Anesthesia Quick Evaluation

## 2017-06-07 NOTE — Anesthesia Postprocedure Evaluation (Signed)
Anesthesia Post Note  Patient: Marcellius Montagna Sen  Procedure(s) Performed: COLONOSCOPY WITH PROPOFOL (N/A )  Patient location during evaluation: Endoscopy Anesthesia Type: General Level of consciousness: awake and alert Pain management: pain level controlled Vital Signs Assessment: post-procedure vital signs reviewed and stable Respiratory status: spontaneous breathing, nonlabored ventilation, respiratory function stable and patient connected to nasal cannula oxygen Cardiovascular status: blood pressure returned to baseline and stable Postop Assessment: no apparent nausea or vomiting Anesthetic complications: no     Last Vitals:  Vitals:   06/07/17 0920 06/07/17 0930  BP: 96/60 (!) 107/52  Pulse: 65 63  Resp: 12 11  Temp:    SpO2: 99% 98%    Last Pain:  Vitals:   06/07/17 0858  TempSrc: Tympanic                 Martha Clan

## 2017-06-07 NOTE — Anesthesia Procedure Notes (Signed)
Performed by: Rhiannon Sassaman, CRNA Pre-anesthesia Checklist: Patient identified, Emergency Drugs available, Suction available, Patient being monitored and Timeout performed Oxygen Delivery Method: Nasal cannula Induction Type: IV induction       

## 2017-06-07 NOTE — Anesthesia Post-op Follow-up Note (Signed)
Anesthesia QCDR form completed.        

## 2017-06-07 NOTE — Op Note (Signed)
[facility] Gastroenterology Patient Name: Kevin [name] Procedure Date: 06/07/2017 8:17 AM MRN: [id number] Account #: [id number] Date of Birth: 1955/04/04 Admit Type: Outpatient Age: 62 Room: [facility] ENDO ROOM 2 Gender: Male Note Status: Finalized Procedure:            Colonoscopy Indications:          Screening for colorectal malignant neoplasm, Incidental                        - Diverticulitis Providers:            Curlie Macken B. Bonna Gains MD, MD Referring MD:         Rose Fillers. Jaynie Crumble, MD (Referring MD) Medicines:            Monitored Anesthesia Care Complications:        No immediate complications. Procedure:            Pre-Anesthesia Assessment:                       - Prior to the procedure, a History and Physical was                        performed, and patient medications, allergies and                        sensitivities were reviewed. The patient's tolerance of                        previous anesthesia was reviewed.                       - The risks and benefits of the procedure and the                        sedation options and risks were discussed with the                        patient. All questions were answered and informed                        consent was obtained.                       - Patient identification and proposed procedure were                        verified prior to the procedure by the physician, the                        nurse, the anesthetist and the technician. The                        procedure was verified in the pre-procedure area in the                        procedure room in the endoscopy suite.                       - ASA Grade Assessment: III - A patient with severe  systemic disease.                       - After reviewing the risks and benefits, the patient                        was deemed in satisfactory condition to undergo the                        procedure.                       After  obtaining informed consent, the colonoscope was                        passed under direct vision. Throughout the procedure,                        the patient's blood pressure, pulse, and oxygen                        saturations were monitored continuously. The                        Colonoscope was introduced through the anus and                        advanced to the the cecum, identified by appendiceal                        orifice and ileocecal valve. The colonoscopy was                        performed with ease. The patient tolerated the                        procedure well. The quality of the bowel preparation                        was good. Findings:      The perianal and digital rectal examinations were normal.      Multiple small and large-mouthed diverticula were found in the sigmoid       colon, descending colon and ascending colon.      The exam was otherwise without abnormality. No masses or lesions found.      The rectum, sigmoid colon, descending colon, transverse colon, ascending       colon and cecum appeared normal.      Internal hemorrhoids were found during retroflexion. The hemorrhoids       were moderate. Impression:           - Diverticulosis in the sigmoid colon, in the                        descending colon and in the ascending colon.                       - The examination was otherwise normal.                       - The rectum, sigmoid colon, descending colon,  transverse colon, ascending colon and cecum are normal.                       - Internal hemorrhoids.                       - No specimens collected. Recommendation:       - Discharge patient to home.                       - Resume previous diet.                       - Continue present medications.                       - Repeat colonoscopy in 5 years.                       - Return to primary care physician as previously                        scheduled.                        - The findings and recommendations were discussed with                        the patient.                       - The findings and recommendations were discussed with                        the patient's family.                       - High fiber diet. Procedure Code(s):    --- Professional ---                       W5462, Colorectal cancer screening; colonoscopy on                        individual not meeting criteria for high risk Diagnosis Code(s):    --- Professional ---                       Z12.11, Encounter for screening for malignant neoplasm                        of colon                       K64.8, Other hemorrhoids                       K57.30, Diverticulosis of large intestine without                        perforation or abscess without bleeding CPT copyright 2016 American Medical Association. All rights reserved. The codes documented in this report are preliminary and upon coder review may  be revised to meet current compliance requirements.  [name], MD Margretta Sidle B. Bonna Gains MD, MD 06/07/2017 9:02:15 AM This report has been signed electronically.  Number of Addenda: 0 Note Initiated On: 06/07/2017 8:17 AM Scope Withdrawal Time: 0 hours 21 minutes 15 seconds  Total Procedure Duration: 0 hours 33 minutes 27 seconds       [facility]

## 2017-06-07 NOTE — H&P (Signed)
[name], MD [address], [place], [place], [place], [address] [address], [place], [place], [place], [address] Phone: [phone]  Fax: [phone]  Primary Care Physician:  Carmon Ginsberg, [place]   Pre-Procedure History & Physical: HPI:  Kevin [name] is a 62 y.o. male is here for a colonoscopy.   Past Medical History:  Diagnosis Date  . ASD (atrial septal defect)   . Brachial plexopathy   . CAD (coronary artery disease)    s/p CABG  . Coronary artery disease    a. s/p 4 vessel CABG in 05/2008 w/ LIMA-LAD, SVG-Diag, sequential SVG-OM1/OM2; b. s/p PCI/DES x 2 to LAD in 10/2009; c. LHC 03/2013 stable disease and patent LAD stents  . Dysrhythmia   . H/O maze procedure 05/17/08   closure of ASD  . Hyperlipidemia   . 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   . Hypertension   . OSA (obstructive sleep apnea)   . Overweight(278.02)   . PAF (paroxysmal atrial fibrillation) ([place])    a. s/p Maze 05/2008, previously on Eliquis->discontinued 05/2016; c. CHADS2VASc => 4 (HTN, stroke x 2, vascular disease)  . Patent foramen ovale    s/p closure  . Psoriatic arthritis ([place])   . Sleep apnea   . 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 Thrombocytopenia ([place])     Past Surgical History:  Procedure Laterality Date  . ACUTE PANCREATITIS  5/11  . APPENDECTOMY  1984  . CHOLECYSTECTOMY  08/19/2009  . CORONARY ARTERY BYPASS GRAFT  05/12/2008   x4 by PeterVan Trigt,MD  . LEFT HEART CATH AND CORONARY ANGIOGRAPHY N/A 11/29/2016   Procedure: LEFT HEART CATH AND CORONARY ANGIOGRAPHY;  Surgeon: Wellington Hampshire, MD;  Location: [facility];  Service: Cardiovascular;  Laterality: N/A;  . PATENT FORAMEN OVALE CLOSURE    . STINTS  8/11   X2  . TONSILLECTOMY     AS A CHILD    Prior to Admission medications   Medication Sig Start Date End Date Taking? Authorizing Provider  ALPRAZolam Duanne Moron) 0.5 MG tablet TAKE 1 TO 2 TABLETS  BY MOUTH AT BEDTIME 04/17/17  Yes Carmon Ginsberg, PA  cetirizine (ZYRTEC) 10 MG tablet Take 10 mg by mouth at bedtime.    Yes [provider]  clobetasol ointment (TEMOVATE) 3.87 % Apply 1 application topically 2 (two) times daily as needed (for psoriasis).   Yes [provider]  desonide (DESOWEN) 0.05 % cream Apply 1 application topically 2 (two) times daily as needed (for psoriasis).   Yes [provider]  escitalopram (LEXAPRO) 20 MG tablet Take 1 tablet (20 mg total) by mouth daily. 04/04/17  Yes Carmon Ginsberg, PA  ezetimibe (ZETIA) 10 MG tablet TAKE 1 TABLET (10 MG TOTAL) BY MOUTH DAILY. 05/20/17 08/18/17 Yes Gollan, Kathlene November, MD  isosorbide mononitrate (IMDUR) 30 MG 24 hr tablet TAKE ONE TABLET BY MOUTH DAILY Patient taking differently: TAKE ONE TABLET (30mg ) BY MOUTH DAILY AT BEDTIME 08/14/16  Yes Gollan, Kathlene November, MD  aspirin EC 81 MG tablet Take 81 mg by mouth daily.    [provider]  ciprofloxacin (CIPRO) 500 MG tablet Take 1 tablet (500 mg total) by mouth 2 (two) times daily. 04/01/17   Delman Kitten, MD  clopidogrel (PLAVIX) 75 MG tablet TAKE 1 TABLET (75 MG TOTAL) BY MOUTH DAILY. 05/20/17   Minna Merritts, MD  metoprolol tartrate (LOPRESSOR) 25 MG  tablet Take 1 tablet (25 mg total) by mouth 2 (two) times daily. 11/20/16 03/24/17  Rise Mu, PA-C  metroNIDAZOLE (FLAGYL) 500 MG tablet Take 1 tablet (500 mg total) by mouth 3 (three) times daily. 04/01/17   Delman Kitten, MD  nitroGLYCERIN (NITROSTAT) 0.4 MG SL tablet Place 1 tablet (0.4 mg total) under the tongue every 5 (five) minutes as needed. May repeat for up to 3 doses. 05/15/16   Gollan, Kathlene November, MD  RANEXA 1000 MG SR tablet TAKE 1 TABLET BY MOUTH 2 TIMES DAILY Patient taking differently: TAKE 1 TABLET (1000mg ) BY MOUTH 2 TIMES DAILY 12/11/16   Minna Merritts, MD  ranitidine (ZANTAC) 300 MG tablet TAKE 1 TABLET BY MOUTH ONCE DAILY 03/25/17   Carmon Ginsberg, PA  rosuvastatin (CRESTOR) 40 MG  tablet TAKE 1 TABLET (40 MG TOTAL) BY MOUTH DAILY. Patient taking differently: TAKE 1 TABLET (40 MG TOTAL) BY MOUTH DAILY AT BEDTIME. 09/03/16   Minna Merritts, MD    Allergies as of 04/12/2017 - Review Complete 04/10/2017  Allergen Reaction Noted  . Strawberry extract Anaphylaxis 12/04/2011  . Dilaudid [hydromorphone hcl] Nausea And Vomiting 06/21/2010    Family History  Problem Relation Age of Onset  . Diabetes Sister   . Ovarian cancer Sister   . Psychiatric Illness Father   . Heart attack Father   . Diabetes Other   . Hypertension Mother   . Heart disease Mother   . Lung cancer Paternal Aunt   . Coronary artery disease Other   . Diabetes Other   . Hypertension Other   . Hyperlipidemia Other   . Cancer Paternal Uncle   . Heart disease Paternal Grandmother   . Heart disease Paternal Grandfather     Social History   Socioeconomic History  . Marital status: Married    Spouse name: [name]  . Number of children: 3  . Years of education: Not on file  . Highest education level: Not on file  Social Needs  . Financial resource strain: Not on file  . Food insecurity - worry: Not on file  . Food insecurity - inability: Not on file  . Transportation needs - medical: Not on file  . Transportation needs - non-medical: Not on file  Occupational History  . Occupation: Full time    Employer: [employer]  Tobacco Use  . Smoking status: Never Smoker  . Smokeless tobacco: Never Used  Substance and Sexual Activity  . Alcohol use: No  . Drug use: No  . Sexual activity: Not on file  Other Topics Concern  . 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 Went to high school. He has worked at his present job for 22 years. He has been married for 34 years. They have 3 children. 2 of his daughters live in [place]. He drinks less than two cups of caffeine per day.  He does not use  tobacco or recreational drugs. He has rare alcohol intake. Lives with wife and daughter      OSA diagnosed at [facility] , had his  sleep studies there  reviewed them all in detail today he  has retrognathia, sinusitis,  rhinitis      His ESS remains very high  at 16 and FSS at 22 . His falls assessment tool score is 5.   nasonex, refitted mask, the recent  HST failed to  document that  apnea is present at all. education about REM BD and EDS, narcolepsy , cataplexy.  45 minutes.     Review of Systems: See HPI, otherwise negative ROS  Physical Exam: BP 118/80   Pulse 78   Temp 97.6 F (36.4 C) (Tympanic)   Resp 20   Ht 5\' 6"  (1.676 m)   Wt 188 lb (85.3 kg)   SpO2 100%   BMI 30.34 kg/m  General:   Alert,  pleasant and cooperative in NAD Head:  Normocephalic and atraumatic. Neck:  Supple; no masses or thyromegaly. Lungs:  Clear throughout to auscultation, normal respiratory effort.    Heart:  +S1, +S2, Regular rate and rhythm, No edema. Abdomen:  Soft, nontender and nondistended. Normal bowel sounds, without guarding, and without rebound.   Neurologic:  Alert and  oriented x4;  grossly normal neurologically.  Impression/Plan: Kevin [name] is here for a colonoscopy to be performed for average risk screening.  Risks, benefits, limitations, and alternatives regarding  colonoscopy have been reviewed with the patient.  Questions have been answered.  All parties agreeable.   Virgel Manifold, MD  06/07/2017, 8:07 AM

## 2017-06-07 NOTE — Transfer of Care (Signed)
Immediate Anesthesia Transfer of Care Note  Patient: Kevin [name]  Procedure(s) Performed: COLONOSCOPY WITH PROPOFOL (N/A )  Patient Location: PACU  Anesthesia Type:General  Level of Consciousness: sedated  Airway & Oxygen Therapy: Patient Spontanous Breathing and Patient connected to nasal cannula oxygen  Post-op Assessment: Report given to RN and Post -op Vital signs reviewed and stable  Post vital signs: Reviewed and stable  Last Vitals:  Vitals:   06/07/17 0759 06/07/17 0858  BP: 118/80 (!) 95/46  Pulse: 78   Resp: 20 16  Temp: 36.4 C (!) 36.3 C  SpO2: 100% 99%    Last Pain:  Vitals:   06/07/17 0858  TempSrc: Tympanic         Complications: No apparent anesthesia complications

## 2017-06-10 ENCOUNTER — Encounter: Payer: Self-pay | Admitting: Gastroenterology

## 2017-06-28 NOTE — Addendum Note (Signed)
Addendum  created 06/28/17 1226 by Doreen Salvage, CRNA   Intraprocedure Meds edited

## 2017-08-16 ENCOUNTER — Other Ambulatory Visit: Payer: Self-pay | Admitting: Family Medicine

## 2017-08-16 ENCOUNTER — Encounter: Payer: Self-pay | Admitting: Family Medicine

## 2017-08-16 ENCOUNTER — Ambulatory Visit (INDEPENDENT_AMBULATORY_CARE_PROVIDER_SITE_OTHER): Payer: 59 | Admitting: Family Medicine

## 2017-08-16 VITALS — BP 108/70 | HR 66 | Temp 97.6°F | Resp 16 | Ht 65.0 in | Wt 194.0 lb

## 2017-08-16 DIAGNOSIS — I25708 Atherosclerosis of coronary artery bypass graft(s), unspecified, with other forms of angina pectoris: Secondary | ICD-10-CM

## 2017-08-16 DIAGNOSIS — I10 Essential (primary) hypertension: Secondary | ICD-10-CM | POA: Diagnosis not present

## 2017-08-16 DIAGNOSIS — G3184 Mild cognitive impairment, so stated: Secondary | ICD-10-CM

## 2017-08-16 DIAGNOSIS — F419 Anxiety disorder, unspecified: Secondary | ICD-10-CM | POA: Diagnosis not present

## 2017-08-16 DIAGNOSIS — I69319 Unspecified symptoms and signs involving cognitive functions following cerebral infarction: Secondary | ICD-10-CM

## 2017-08-16 DIAGNOSIS — K5730 Diverticulosis of large intestine without perforation or abscess without bleeding: Secondary | ICD-10-CM

## 2017-08-16 DIAGNOSIS — L409 Psoriasis, unspecified: Secondary | ICD-10-CM | POA: Insufficient documentation

## 2017-08-16 DIAGNOSIS — Z125 Encounter for screening for malignant neoplasm of prostate: Secondary | ICD-10-CM

## 2017-08-16 DIAGNOSIS — E782 Mixed hyperlipidemia: Secondary | ICD-10-CM

## 2017-08-16 DIAGNOSIS — Z0000 Encounter for general adult medical examination without abnormal findings: Secondary | ICD-10-CM | POA: Diagnosis not present

## 2017-08-16 NOTE — Patient Instructions (Signed)
We will call you with the lab results. Do get them fasting.

## 2017-08-16 NOTE — Progress Notes (Signed)
  Subjective:     Patient ID: Kevin [name], male   DOB: 10/29/1955, 62 y.o.   MRN: [id number] Chief Complaint  Patient presents with  . Annual Exam    Patient comes in office today for his annual physical he states that he feels well today and has no concerns to address. Patient reports that he has a fairly balanced diet but is not actively exercising, paitent sleeps on average 8-9hrs a night and states that his libido is normal. Patients last reported TDap 08/01/12, Colonoscopy 06/07/17, PSA 05/07/16, patient is due for his screening labs today.    HPI Continues to be followed by Dr. Revonda Standard, cardiology; Dr. Brett Fairy, neurology, Dr. Bonna Gains, G.I.; Dr. Kenton Kingfisher, dentist; [facility] for vision and visual deficit. 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  Review of Systems General: Feeling well HEENT: regular dental visits and eye exams. Reports chronic tinnitus Cardiovascular: no chest pain, shortness of breath, or palpitations-States he does not use his NTG tabs. GI: no heartburn controlled with medication; no change in bowel habits or blood in the stool GU: nocturia x 1, no change in bladder habits  Neuro: mild cognitive delay followed by neurology. Has a left upper quadrant visual field deficit residual from CVA. Psychiatric: not depressed, anxiety controlled by medication. Musculoskeletal: Occasional left distal quad discomfort.   Skin: see dermatology every year or two for his psoriasis. Objective:   Physical Exam  Constitutional: He appears well-developed and well-nourished. No distress.  Eyes: PERRLA, EOMI Neck: no thyromegaly, tenderness or nodules, no carotid bruits, no cervical adenopathy ENT: TM's intact without inflammation; No tonsillar enlargement or exudate, Lungs: Clear Heart : RRR without murmur or gallop Abd: bowel sounds present, soft, non-tender, no organomegaly Rectal: Prostate firm and non-tender, Stoo Extremities: no edema       Assessment:    1. Annual physical exam  2. Screening for prostate cancer - PSA  3. Essential hypertension: per cardiology - Comprehensive metabolic panel  4. MCI (mild cognitive impairment): per neurology  5. Atherosclerosis of coronary artery bypass graft of native heart with stable angina pectoris [facility]): per cardiology  6. CVA, old, cognitive deficits  7. Mixed hyperlipidemia: continue statin  - Lipid panel  8. Anxiety: continue escitalopram and alprazolam.  9. Diverticulosis of large intestine without diverticulitis: per recent colonoscopy     Plan:    Further f/u pending lab work.

## 2017-08-21 ENCOUNTER — Other Ambulatory Visit: Payer: Self-pay | Admitting: Cardiovascular Disease

## 2017-09-03 ENCOUNTER — Encounter: Payer: Self-pay | Admitting: Family Medicine

## 2017-09-03 ENCOUNTER — Ambulatory Visit (INDEPENDENT_AMBULATORY_CARE_PROVIDER_SITE_OTHER): Payer: 59 | Admitting: Family Medicine

## 2017-09-03 VITALS — BP 116/72 | HR 58 | Temp 97.6°F | Resp 15

## 2017-09-03 DIAGNOSIS — D294 Benign neoplasm of scrotum: Secondary | ICD-10-CM

## 2017-09-03 NOTE — Patient Instructions (Signed)
Let me know if there are any changes or you wish a dermatology referral.

## 2017-09-03 NOTE — Progress Notes (Signed)
  Subjective:     Patient ID: Kevin [name], male   DOB: 1956-03-04, 62 y.o.   MRN: [id number] Chief Complaint  Patient presents with  . Rash    Patient comes in office today to address rash/skin irritation around his pubic area and scotum. Patient states about a week ago he noticed a small bump near his pubic region, he states that it was sore to the touch but has since spread, patient describes bump as a blood blister.    HPI   Review of Systems     Objective:   Physical Exam  Constitutional: He appears well-developed and well-nourished. No distress.  Skin:  Several 1-2 mm dark hyperkeratotic lesions on his scrotum c/w angiokeratomas.       Assessment:    1. Angiokeratoma of scrotum    Plan:    Reassurance. May call for dermatology referral if he wishes further evaluation.

## 2017-09-25 ENCOUNTER — Ambulatory Visit
Admission: RE | Admit: 2017-09-25 | Discharge: 2017-09-25 | Disposition: A | Discharge status: Home / Self Care | Payer: 59 | Source: Ambulatory Visit | Attending: Gastroenterology | Admitting: Gastroenterology

## 2017-09-25 ENCOUNTER — Encounter: Payer: Self-pay | Admitting: Gastroenterology

## 2017-09-25 ENCOUNTER — Telehealth: Payer: Self-pay

## 2017-09-25 ENCOUNTER — Ambulatory Visit (INDEPENDENT_AMBULATORY_CARE_PROVIDER_SITE_OTHER): Payer: 59 | Admitting: Gastroenterology

## 2017-09-25 ENCOUNTER — Other Ambulatory Visit
Admission: RE | Admit: 2017-09-25 | Discharge: 2017-09-25 | Disposition: A | Discharge status: Home / Self Care | Payer: 59 | Source: Ambulatory Visit | Attending: Gastroenterology | Admitting: Gastroenterology

## 2017-09-25 VITALS — BP 112/75 | HR 69 | Ht 65.0 in | Wt 193.4 lb

## 2017-09-25 DIAGNOSIS — R109 Unspecified abdominal pain: Secondary | ICD-10-CM | POA: Diagnosis not present

## 2017-09-25 DIAGNOSIS — R1012 Left upper quadrant pain: Secondary | ICD-10-CM

## 2017-09-25 DIAGNOSIS — R1084 Generalized abdominal pain: Secondary | ICD-10-CM

## 2017-09-25 DIAGNOSIS — K5730 Diverticulosis of large intestine without perforation or abscess without bleeding: Secondary | ICD-10-CM | POA: Insufficient documentation

## 2017-09-25 DIAGNOSIS — Z9049 Acquired absence of other specified parts of digestive tract: Secondary | ICD-10-CM | POA: Diagnosis not present

## 2017-09-25 DIAGNOSIS — I700 Atherosclerosis of aorta: Secondary | ICD-10-CM | POA: Insufficient documentation

## 2017-09-25 LAB — CREATININE, SERUM
Creatinine, Ser: 1.42 mg/dL — ABNORMAL HIGH (ref 0.61–1.24)
GFR calc non Af Amer: 51 mL/min — ABNORMAL LOW (ref >60)
GFR, EST AFRICAN AMERICAN: 59 mL/min — AB (ref >60)

## 2017-09-25 LAB — BUN: BUN: 28 mg/dL — ABNORMAL HIGH (ref 8–23)

## 2017-09-25 MED ORDER — IOHEXOL 350 MG/ML SOLN
75.0000 mL | Freq: Once | INTRAVENOUS | Status: AC | PRN
  Administered 2017-09-25: 75 mL via INTRAVENOUS

## 2017-09-25 NOTE — Telephone Encounter (Signed)
Wife calls and states pt had (2) episodes of abdominal pain 4 days ago, the second episode he could not move, sweating (6/24) and now he is just sore. No vomiting or diarrhea that wife knows of. No fever.  What do you advise?

## 2017-09-25 NOTE — Progress Notes (Signed)
Kevin Antigua, MD [address]  [place], [place] [address]  Main: [phone]  Fax: [phone]   Primary Care Physician: Carmon Ginsberg, [place]  Primary Gastroenterologist:  Dr. Vonda [name]  Chief Complaint  Patient presents with  . Abdominal Pain    HPI: Kevin [name] is a 62 y.o. male here for abdominal pain.  Symptoms started 4 days ago.  He reports left upper quadrant to left mid quadrant severe abdominal pain that woke him up 4 nights ago.  Pain radiated to the back, no nausea or vomiting with it.  It was 8/10, sharp.  He changed positions, and laid on his side, and the pain resolved and he fell asleep.  The pain reoccurred the next night in a similar fashion.  Today and yesterday the pain has not occurred, but he reports some abdominal soreness.  He had diverticulitis in December and states that the pain was much more intense and severe at that time, and brought him to his knees.  However, in December he had an initial episode of diverticulitis, which was treated with antibiotics and the pain improved.  Then he had pain again about 2 to 3 weeks after that, and he was treated again with diverticulitis the second time.  He states that his pain this time, with similar to the second episode of diverticulitis in December.  He has undergone colonoscopy in March 2019 that showed diverticulosis and no colon mass.  He also has had gallstone pancreatitis in 2011 and cholecystectomy at that time.  Current Outpatient Medications  Medication Sig Dispense Refill  . ALPRAZolam (XANAX) 0.5 MG tablet TAKE 1 TO 2 TABLETS BY MOUTH AT BEDTIME 60 tablet 5  . aspirin EC 81 MG tablet Take 81 mg by mouth daily.    . cetirizine (ZYRTEC) 10 MG tablet Take 10 mg by mouth at bedtime.     . clobetasol ointment (TEMOVATE) 8.25 % Apply 1 application topically 2 (two) times daily as needed (for psoriasis).    . clopidogrel (PLAVIX) 75 MG tablet TAKE 1 TABLET (75 MG TOTAL) BY MOUTH  DAILY. 90 tablet 2  . desonide (DESOWEN) 0.05 % cream Apply 1 application topically 2 (two) times daily as needed (for psoriasis).    Marland Kitchen escitalopram (LEXAPRO) 20 MG tablet Take 1 tablet (20 mg total) by mouth daily. 90 tablet 3  . ezetimibe (ZETIA) 10 MG tablet TAKE 1 TABLET (10 MG TOTAL) BY MOUTH DAILY. 90 tablet 2  . isosorbide mononitrate (IMDUR) 30 MG 24 hr tablet TAKE 1 TABLET BY MOUTH ONCE DAILY 90 tablet 3  . metoprolol tartrate (LOPRESSOR) 25 MG tablet Take 1 tablet (25 mg total) by mouth 2 (two) times daily. 180 tablet 3  . metoprolol tartrate (LOPRESSOR) 25 MG tablet Take 25 mg by mouth 2 (two) times daily.    . nitroGLYCERIN (NITROSTAT) 0.4 MG SL tablet Place 1 tablet (0.4 mg total) under the tongue every 5 (five) minutes as needed. May repeat for up to 3 doses. 25 tablet 6  . RANEXA 1000 MG SR tablet TAKE 1 TABLET BY MOUTH 2 TIMES DAILY (Patient taking differently: TAKE 1 TABLET (1000mg ) BY MOUTH 2 TIMES DAILY) 180 tablet 3  . ranitidine (ZANTAC) 300 MG tablet TAKE 1 TABLET BY MOUTH ONCE DAILY 90 tablet 3  . rosuvastatin (CRESTOR) 40 MG tablet TAKE 1 TABLET BY MOUTH DAILY 90 tablet 1   No current facility-administered medications for this visit.     Allergies as of 09/25/2017 -  Review Complete 09/03/2017  Allergen Reaction Noted  . Strawberry extract Anaphylaxis 12/04/2011  . Dilaudid [hydromorphone hcl] Nausea And Vomiting 06/21/2010    ROS:  General: Negative for anorexia, weight loss, fever, chills, fatigue, weakness. ENT: Negative for hoarseness, difficulty swallowing , nasal congestion. CV: Negative for chest pain, angina, palpitations, dyspnea on exertion, peripheral edema.  Respiratory: Negative for dyspnea at rest, dyspnea on exertion, cough, sputum, wheezing.  GI: See history of present illness. GU:  Negative for dysuria, hematuria, urinary incontinence, urinary frequency, nocturnal urination.  Endo: Negative for unusual weight change.    Physical Examination:    There were no vitals taken for this visit.  General: Well-nourished, well-developed in no acute distress.  Eyes: No icterus. Conjunctivae pink. Mouth: Oropharyngeal mucosa moist and pink , no lesions erythema or exudate. Neck: Supple, Trachea midline Abdomen: Bowel sounds are normal, nontender, nondistended, no hepatosplenomegaly or masses, no abdominal bruits or hernia , no rebound or guarding.   Extremities: No lower extremity edema. No clubbing or deformities. Neuro: Alert and oriented x 3.  Grossly intact. Skin: Warm and dry, no jaundice.   Psych: Alert and cooperative, normal mood and affect.   Labs: CMP     Component Value Date/Time   NA 138 04/01/2017 1146   NA 142 06/14/2016 1156   NA 141 03/18/2013 1739   K 4.8 04/01/2017 1146   K 4.0 03/18/2013 1739   CL 102 04/01/2017 1146   CL 107 03/18/2013 1739   CO2 28 04/01/2017 1146   CO2 30 03/18/2013 1739   GLUCOSE 123 (H) 04/01/2017 1146   GLUCOSE 79 03/18/2013 1739   BUN 17 04/01/2017 1146   BUN 18 06/14/2016 1156   BUN 23 (H) 03/18/2013 1739   CREATININE 1.35 (H) 04/01/2017 1146   CREATININE 1.50 (H) 12/18/2013 1008   CALCIUM 9.4 04/01/2017 1146   CALCIUM 9.2 03/18/2013 1739   PROT 7.7 04/01/2017 1146   PROT 6.9 06/14/2016 1156   ALBUMIN 4.2 04/01/2017 1146   ALBUMIN 4.4 06/14/2016 1156   AST 19 04/01/2017 1146   ALT 17 04/01/2017 1146   ALKPHOS 50 04/01/2017 1146   BILITOT 1.0 04/01/2017 1146   BILITOT 0.6 06/14/2016 1156   GFRNONAA 55 (L) 04/01/2017 1146   GFRNONAA 50 (L) 12/18/2013 1008   GFRAA >60 04/01/2017 1146   GFRAA 58 (L) 12/18/2013 1008   Lab Results  Component Value Date   WBC 12.6 (H) 04/01/2017   HGB 13.6 04/01/2017   HCT 40.5 04/01/2017   MCV 95.0 04/01/2017   PLT 228 04/01/2017    Imaging Studies: No results found.  Assessment and Plan:   [name] is a 62 y.o. y/o male here for evaluation of abdominal pain with history of diverticulitis in December 2018, status post colonoscopy  in March 2019 which did not show any colon cancers and showed diverticulosis  Given that his pain is similar in nature to his diverticulitis in December 2018, CT scan is indicated to rule out diverticulitis.  This has been ordered as stat, and patient has been sent to radiology to get this done.  Radiology has my cell number to call me with the results. However, his pain is not as severe intense at this time, and has resolved as of the last 2 days which is reassuring If CT scan is positive for diverticulitis, he will need antibiotics, and surgery referral for recurrent diverticulitis  If CT scan does not show diverticulitis, will treat him with PPI, as he has  had heartburn before which is now well controlled with Zantac.  However, pain could be due to dyspepsia or GERD, and the fact that the only occurred at night when he was sleeping both times, it might be related to breakthrough acid reflux.  Dr Kevin [name]

## 2017-09-26 ENCOUNTER — Other Ambulatory Visit: Payer: Self-pay | Admitting: Gastroenterology

## 2017-09-26 MED ORDER — PANTOPRAZOLE SODIUM 20 MG PO TBEC: 20.0000 mg | DELAYED_RELEASE_TABLET | Freq: Two times a day (BID) | ORAL | 1 refills | Status: DC

## 2017-09-26 NOTE — Progress Notes (Signed)
Zantac discontinued and protonix BID prescribed due to sever abdominal pain that occurs at night. Likely related to GERD. (Risks of PPI use were discussed with patient including bone loss, C. Diff diarrhea, pneumonia, infections, CKD, electrolyte abnormalities.  If clinically possible based on symptoms, goal would be to maintain patient on the lowest dose possible, or discontinue the medication with institution of acid reflux lifestyle modifications over time. Pt. Verbalizes understanding and chooses to continue the medication.)

## 2017-09-27 DIAGNOSIS — L400 Psoriasis vulgaris: Secondary | ICD-10-CM | POA: Diagnosis not present

## 2017-09-27 DIAGNOSIS — Z85828 Personal history of other malignant neoplasm of skin: Secondary | ICD-10-CM | POA: Diagnosis not present

## 2017-09-27 DIAGNOSIS — D2262 Melanocytic nevi of left upper limb, including shoulder: Secondary | ICD-10-CM | POA: Diagnosis not present

## 2017-09-27 DIAGNOSIS — D2271 Melanocytic nevi of right lower limb, including hip: Secondary | ICD-10-CM | POA: Diagnosis not present

## 2017-09-27 DIAGNOSIS — L821 Other seborrheic keratosis: Secondary | ICD-10-CM | POA: Diagnosis not present

## 2017-09-27 DIAGNOSIS — D2272 Melanocytic nevi of left lower limb, including hip: Secondary | ICD-10-CM | POA: Diagnosis not present

## 2017-09-27 DIAGNOSIS — D2261 Melanocytic nevi of right upper limb, including shoulder: Secondary | ICD-10-CM | POA: Diagnosis not present

## 2017-09-27 DIAGNOSIS — L570 Actinic keratosis: Secondary | ICD-10-CM | POA: Diagnosis not present

## 2017-09-27 DIAGNOSIS — Z08 Encounter for follow-up examination after completed treatment for malignant neoplasm: Secondary | ICD-10-CM | POA: Diagnosis not present

## 2017-09-27 DIAGNOSIS — L218 Other seborrheic dermatitis: Secondary | ICD-10-CM | POA: Diagnosis not present

## 2017-09-27 DIAGNOSIS — X32XXXA Exposure to sunlight, initial encounter: Secondary | ICD-10-CM | POA: Diagnosis not present

## 2017-09-27 NOTE — Telephone Encounter (Signed)
Pt came in office

## 2017-11-11 ENCOUNTER — Other Ambulatory Visit: Payer: Self-pay | Admitting: Physician Assistant

## 2017-11-11 ENCOUNTER — Other Ambulatory Visit: Payer: Self-pay | Admitting: Gastroenterology

## 2017-11-21 ENCOUNTER — Ambulatory Visit (INDEPENDENT_AMBULATORY_CARE_PROVIDER_SITE_OTHER): Payer: 59 | Admitting: Adult Health

## 2017-11-21 ENCOUNTER — Encounter: Payer: Self-pay | Admitting: Adult Health

## 2017-11-21 VITALS — BP 110/73 | HR 54 | Ht 65.0 in | Wt 198.0 lb

## 2017-11-21 DIAGNOSIS — Z8709 Personal history of other diseases of the respiratory system: Secondary | ICD-10-CM | POA: Diagnosis not present

## 2017-11-21 DIAGNOSIS — Z87898 Personal history of other specified conditions: Secondary | ICD-10-CM

## 2017-11-21 DIAGNOSIS — R413 Other amnesia: Secondary | ICD-10-CM

## 2017-11-21 DIAGNOSIS — R0683 Snoring: Secondary | ICD-10-CM | POA: Diagnosis not present

## 2017-11-21 DIAGNOSIS — R400 Somnolence: Secondary | ICD-10-CM | POA: Diagnosis not present

## 2017-11-21 NOTE — Patient Instructions (Signed)
Your Plan:  Continue to monitor memory- score decreased slightly Home sleep test If your symptoms worsen or you develop new symptoms please let us know.   Thank you for coming to see [place] at [facility]. I hope we have been able to provide you high quality care today.  You may receive a patient satisfaction survey over the next few weeks. We would appreciate your feedback and comments so that we may continue to improve ourselves and the health of our patients.

## 2017-11-21 NOTE — Progress Notes (Signed)
PATIENT: Tristian Sickinger DOB: [birth year]  REASON FOR VISIT: follow up HISTORY FROM: patient  HISTORY OF PRESENT ILLNESS: Today 11/21/17: Mr. Robarts is a 62 year old male with a history of memory disturbance.  He returns today for follow-up.  He feels that his memory has remained relatively stable however his wife notes that she has noticed some changes.  She notes that he is a conversation.  He is able to complete all ADLs independently.  He reports that he operates a motor vehicle without difficulty.  He is still able to manage the finances without difficulty.  Reports good appetite.  He reports that his sleep varies.  He states that he never  Feels rested. he states that he has significant daytime sleepiness and has noticed being more tired throughout the day.  He states in the past he was diagnosed with sleep apnea but reports that he lost weight and when he was retested he was told he did not have apnea..  He states that since that time he has gained weight and has begin snoring again.  He also reports tinnitus in the right ear.  He reports that this is been present since his stroke.  He has never had it evaluated.  He describes it as a high-pitched whine.  He states that it is there continuously.  He returns today for evaluation.  HISTORY 11/14/2016, his chief complaint in the past has been cognitive slowing rather than forgetfulness. His wife also feels that he has become more and more forgetful but he does very well on our standard office neuropsychology tests. He scored 30 out of 30 on a Mini-Mental Status Examination by Lillia Corporal, his [place]ral cognitive assessment test documented 28 out of 30 points. He cannot remember if he ate or spoke to a customer ,  difficulties with beginning and finishing projects- all beginning with his strokes in 2007.   REVIEW OF SYSTEMS: Out of a complete 14 system review of symptoms, the patient complains only of the following symptoms, and all other reviewed systems  are negative.  See HPI  ALLERGIES: Allergies  Allergen Reactions  . Strawberry Extract Anaphylaxis       . Dilaudid [Hydromorphone Hcl] Nausea And Vomiting    HOME MEDICATIONS: Outpatient Medications Prior to Visit  Medication Sig Dispense Refill  . ALPRAZolam (XANAX) 0.5 MG tablet TAKE 1 TO 2 TABLETS BY MOUTH AT BEDTIME 60 tablet 5  . aspirin EC 81 MG tablet Take 81 mg by mouth daily.    . cetirizine (ZYRTEC) 10 MG tablet Take 10 mg by mouth at bedtime.     . clobetasol ointment (TEMOVATE) 1.61 % Apply 1 application topically 2 (two) times daily as needed (for psoriasis).    . clopidogrel (PLAVIX) 75 MG tablet TAKE 1 TABLET (75 MG TOTAL) BY MOUTH DAILY. 90 tablet 2  . desonide (DESOWEN) 0.05 % cream Apply 1 application topically 2 (two) times daily as needed (for psoriasis).    Marland Kitchen escitalopram (LEXAPRO) 20 MG tablet Take 1 tablet (20 mg total) by mouth daily. 90 tablet 3  . isosorbide mononitrate (IMDUR) 30 MG 24 hr tablet TAKE 1 TABLET BY MOUTH ONCE DAILY 90 tablet 3  . metoprolol tartrate (LOPRESSOR) 25 MG tablet TAKE 1 TABLET BY MOUTH TWICE DAILY 180 tablet 3  . nitroGLYCERIN (NITROSTAT) 0.4 MG SL tablet Place 1 tablet (0.4 mg total) under the tongue every 5 (five) minutes as needed. May repeat for up to 3 doses. 25 tablet 6  . pantoprazole (  PROTONIX) 20 MG tablet TAKE 1 TABLET BY MOUTH TWICE DAILY BEFORE A MEAL. 60 tablet 1  . RANEXA 1000 MG SR tablet TAKE 1 TABLET BY MOUTH 2 TIMES DAILY (Patient taking differently: TAKE 1 TABLET (1000mg ) BY MOUTH 2 TIMES DAILY) 180 tablet 3  . rosuvastatin (CRESTOR) 40 MG tablet TAKE 1 TABLET BY MOUTH DAILY 90 tablet 1  . ezetimibe (ZETIA) 10 MG tablet TAKE 1 TABLET (10 MG TOTAL) BY MOUTH DAILY. 90 tablet 2  . metoprolol tartrate (LOPRESSOR) 25 MG tablet Take 1 tablet (25 mg total) by mouth 2 (two) times daily. 180 tablet 3  . metoprolol tartrate (LOPRESSOR) 25 MG tablet Take 25 mg by mouth 2 (two) times daily.     No facility-administered  medications prior to visit.     PAST MEDICAL HISTORY: Past Medical History:  Diagnosis Date  . ASD (atrial septal defect)   . Brachial plexopathy   . CAD (coronary artery disease)    s/p CABG  . Coronary artery disease    a. s/p 4 vessel CABG in 05/2008 w/ LIMA-LAD, SVG-Diag, sequential SVG-OM1/OM2; b. s/p PCI/DES x 2 to LAD in 10/2009; c. LHC 03/2013 stable disease and patent LAD stents  . Dysrhythmia   . H/O maze procedure 05/17/08   closure of ASD  . Hyperlipidemia   . 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   . Hypertension   . OSA (obstructive sleep apnea)   . Overweight(278.02)   . PAF (paroxysmal atrial fibrillation) ([place])    a. s/p Maze 05/2008, previously on Eliquis->discontinued 05/2016; c. CHADS2VASc => 4 (HTN, stroke x 2, vascular disease)  . Patent foramen ovale    s/p closure  . Psoriatic arthritis ([place])   . Sleep apnea   . Stroke [facility])    right brain, 07/2005,left brain, 05/2005  . Thrombocytopenia ([place])     PAST SURGICAL HISTORY: Past Surgical History:  Procedure Laterality Date  . ACUTE PANCREATITIS  5/11  . APPENDECTOMY  1984  . CHOLECYSTECTOMY  08/19/2009  . COLONOSCOPY WITH PROPOFOL N/A 06/07/2017   Procedure: COLONOSCOPY WITH PROPOFOL;  Surgeon: Virgel Manifold, MD;  Location: [facility] ENDOSCOPY;  Service: Endoscopy;  Laterality: N/A;  . CORONARY ARTERY BYPASS GRAFT  05/12/2008   x4 by PeterVan Trigt,MD  . LEFT HEART CATH AND CORONARY ANGIOGRAPHY N/A 11/29/2016   Procedure: LEFT HEART CATH AND CORONARY ANGIOGRAPHY;  Surgeon: Wellington Hampshire, MD;  Location: [place] [facility];  Service: Cardiovascular;  Laterality: N/A;  . PATENT FORAMEN OVALE CLOSURE    . STINTS  8/11   X2  . TONSILLECTOMY     AS A CHILD    FAMILY HISTORY: Family History  Problem Relation Age of Onset  . Diabetes Sister   . Ovarian cancer Sister   . Psychiatric Illness Father   . Heart attack Father   . Diabetes Other   . Hypertension Mother     . Heart disease Mother   . Lung cancer Paternal Aunt   . Coronary artery disease Other   . Diabetes Other   . Hypertension Other   . Hyperlipidemia Other   . Cancer Paternal Uncle   . Heart disease Paternal Grandmother   . Heart disease Paternal Grandfather     SOCIAL HISTORY: Social History   Socioeconomic History  . Marital status: Married    Spouse name: [name]  . Number of children: 3  . Years of education: Not on file  .  Highest education level: Not on file  Occupational History  . Occupation: Full time    Employer: [employer]  Social Needs  . Financial resource strain: Not on file  . Food insecurity:    Worry: Not on file    Inability: Not on file  . Transportation needs:    Medical: Not on file    Non-medical: Not on file  Tobacco Use  . Smoking status: Never Smoker  . Smokeless tobacco: Never Used  Substance and Sexual Activity  . Alcohol use: No  . Drug use: No  . Sexual activity: Not on file  Lifestyle  . Physical activity:    Days per week: Not on file    Minutes per session: Not on file  . Stress: Not on file  Relationships  . Social connections:    Talks on phone: Not on file    Gets together: Not on file    Attends religious service: Not on file    Active member of club or organization: Not on file    Attends meetings of clubs or organizations: Not on file    Relationship status: Not on file  . Intimate partner violence:    Fear of current or ex partner: Not on file    Emotionally abused: Not on file    Physically abused: Not on file    Forced sexual activity: Not on file  Other Topics Concern  . 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 Went to high school. He has worked at his present job for 22 years. He has been married for 34 years. They have 3 children. 2 of his daughters live in [place]. He drinks less than  two cups of caffeine per day. He does not use  tobacco or recreational drugs. He has rare alcohol intake. Lives with wife and daughter      OSA diagnosed at [facility] , had his  sleep studies there  reviewed them all in detail today he  has retrognathia, sinusitis,  rhinitis      His ESS remains very high  at 16 and FSS at 14 . His falls assessment tool score is 5.   nasonex, refitted mask, the recent  HST failed to document that  apnea is present at all. education about REM BD and EDS, narcolepsy , cataplexy.  45 minutes.       PHYSICAL EXAM  Vitals:   11/21/17 0916  BP: 110/73  Pulse: (!) 54  Weight: 198 lb (89.8 kg)  Height: 5\' 5"  (1.651 m)   Body mass index is 32.95 kg/m.   Montreal Cognitive Assessment  11/21/2017 11/14/2016  Visuospatial/ Executive (0/5) 5 5  Naming (0/3) 3 3  Attention: Read list of digits (0/2) 2 2  Attention: Read list of letters (0/1) 0 1  Attention: Serial 7 subtraction starting at 100 (0/3) 2 3  Language: Repeat phrase (0/2) 2 1  Language : Fluency (0/1) 1 1  Abstraction (0/2) 2 2  Delayed Recall (0/5) 3 4  Orientation (0/6) 6 6  Total 26 28     Generalized: Well developed, in no acute distress   Neurological examination  Mentation: Alert oriented to time, place, history taking. Follows all commands speech and language fluent Cranial nerve II-XII: Pupils were equal round reactive to light. Extraocular movements were full, visual field were full on confrontational test. Facial sensation and  strength were normal. Uvula tongue midline. Head turning and shoulder shrug  were normal and symmetric. Motor: The motor testing reveals 5 over 5 strength of all 4 extremities. Good symmetric motor tone is noted throughout.  Sensory: Sensory testing is intact to soft touch on all 4 extremities. No evidence of extinction is noted.  Coordination: Cerebellar testing reveals good finger-nose-finger and heel-to-shin bilaterally.  Gait and station: Gait is  normal. Tandem gait is normal. Romberg is negative. No drift is seen.  Reflexes: Deep tendon reflexes are symmetric and normal bilaterally.   DIAGNOSTIC DATA (LABS, IMAGING, TESTING) - I reviewed patient records, labs, notes, testing and imaging myself where available.  Lab Results  Component Value Date   WBC 12.6 (H) 04/01/2017   HGB 13.6 04/01/2017   HCT 40.5 04/01/2017   MCV 95.0 04/01/2017   PLT 228 04/01/2017      Component Value Date/Time   NA 138 04/01/2017 1146   NA 142 06/14/2016 1156   NA 141 03/18/2013 1739   K 4.8 04/01/2017 1146   K 4.0 03/18/2013 1739   CL 102 04/01/2017 1146   CL 107 03/18/2013 1739   CO2 28 04/01/2017 1146   CO2 30 03/18/2013 1739   GLUCOSE 123 (H) 04/01/2017 1146   GLUCOSE 79 03/18/2013 1739   BUN 28 (H) 09/25/2017 1643   BUN 18 06/14/2016 1156   BUN 23 (H) 03/18/2013 1739   CREATININE 1.42 (H) 09/25/2017 1643   CREATININE 1.50 (H) 12/18/2013 1008   CALCIUM 9.4 04/01/2017 1146   CALCIUM 9.2 03/18/2013 1739   PROT 7.7 04/01/2017 1146   PROT 6.9 06/14/2016 1156   ALBUMIN 4.2 04/01/2017 1146   ALBUMIN 4.4 06/14/2016 1156   AST 19 04/01/2017 1146   ALT 17 04/01/2017 1146   ALKPHOS 50 04/01/2017 1146   BILITOT 1.0 04/01/2017 1146   BILITOT 0.6 06/14/2016 1156   GFRNONAA 51 (L) 09/25/2017 1643   GFRNONAA 50 (L) 12/18/2013 1008   GFRAA 59 (L) 09/25/2017 1643   GFRAA 58 (L) 12/18/2013 1008   Lab Results  Component Value Date   CHOL 149 05/07/2016   HDL 42 05/07/2016   LDLCALC 81 05/07/2016   TRIG 130 05/07/2016   CHOLHDL 3.5 05/07/2016   Lab Results  Component Value Date   HGBA1C  05/11/2008    5.5 (NOTE)   The ADA recommends the following therapeutic goal for glycemic   control related to Hgb A1C measurement:   Goal of Therapy:   < 7.0% Hgb A1C   Reference: American Diabetes Association: Clinical Practice   Recommendations 2008, Diabetes Care,  2008, 31:(Suppl 1).      ASSESSMENT AND PLAN 62 y.o. year old male  has a past  medical history of ASD (atrial septal defect), Brachial plexopathy, CAD (coronary artery disease), Coronary artery disease, Dysrhythmia, H/O maze procedure (05/17/08), Hyperlipidemia, Hypersomnia, organic (09/24/2012), Hypertension, OSA (obstructive sleep apnea), Overweight(278.02), PAF (paroxysmal atrial fibrillation) ([place]), Patent foramen ovale, Psoriatic arthritis ([place]), Sleep apnea, Stroke ([place]), and Thrombocytopenia ([place]). here with:  1 memory disturbance 2.  Snoring 3.  Daytime sleepiness 4.  History of apnea  The patient's memory has slightly declined.  The Patient is now reporting daytime sleepiness, snoring and a previous history of apnea.  I will set the patient up for a home sleep study.  We will continue to monitor the memory.  He is advised that if his symptoms worsen or he develops new symptoms he should let us know.  He will follow-up in  6 months or sooner if needed.    Ward Givens, MSN, NP-C 11/21/2017, 9:37 AM [facility] [address] [place], [place] [address] [phone]

## 2017-11-27 ENCOUNTER — Other Ambulatory Visit: Payer: Self-pay | Admitting: Family Medicine

## 2017-11-27 DIAGNOSIS — F419 Anxiety disorder, unspecified: Secondary | ICD-10-CM

## 2017-12-03 ENCOUNTER — Telehealth: Payer: Self-pay | Admitting: Cardiovascular Disease

## 2017-12-03 ENCOUNTER — Other Ambulatory Visit: Payer: Self-pay | Admitting: Cardiovascular Disease

## 2017-12-03 NOTE — Telephone Encounter (Signed)
Please advise due to pt having chest pain with generic version? Pt is requesting Brand only but I don't recall Ranexa being Generic at this time. Please advise.

## 2017-12-03 NOTE — Telephone Encounter (Signed)
°*  STAT* If patient is at the pharmacy, call can be transferred to refill team.   1. Which medications need to be refilled? (please list name of each medication and dose if known) Renexa 1000 MG (name brand only)  2. Which pharmacy/location (including street and city if local pharmacy) is medication to be sent to? [facility] pharmacy   3. Do they need a 30 day or 90 day supply?   Patient wife calling, states that the patient had been placed on the generic version but has been having issues (chest pain) with the generic.  Patient would like for [place] to fill out the forms to indicate patient needs name brand only that the pharmacy will be sending.

## 2017-12-04 NOTE — Telephone Encounter (Signed)
S/w patient's wife, ok per [name]. States patient switched to generic ranexa a month or so ago. She does not think it is helping his chest pain anymore. Chest pain is occurring more often. He has poor memory and cannot remember when or if he's had chest pain during the day. Recently, they have been on vacation together. In being with him over a number of days in a row, she was able to see that sometimes he would have chest pain with exertion. Otherwise, he is unable to tell her if he has during the day. She would like [place] to order brand name Ranexa. Patient is due for 12 month f/u. He's scheduled to see Gollan on 12/16/17; however, offered appointment with Sharolyn Douglas on 12/06/17 to be seen sooner. Advised it would be good for provider to see patient about this new chest pain that is occurring and help to determine what steps to take next in pursuing the brand of ranexa or not.  She verbalized understanding and was very Patent attorney.

## 2017-12-06 ENCOUNTER — Ambulatory Visit (INDEPENDENT_AMBULATORY_CARE_PROVIDER_SITE_OTHER): Payer: 59 | Admitting: Nurse Practitioner

## 2017-12-06 ENCOUNTER — Encounter: Payer: Self-pay | Admitting: Nurse Practitioner

## 2017-12-06 VITALS — BP 112/62 | HR 57 | Ht 66.0 in | Wt 199.2 lb

## 2017-12-06 DIAGNOSIS — I25110 Atherosclerotic heart disease of native coronary artery with unstable angina pectoris: Secondary | ICD-10-CM

## 2017-12-06 DIAGNOSIS — I200 Unstable angina: Secondary | ICD-10-CM

## 2017-12-06 DIAGNOSIS — I10 Essential (primary) hypertension: Secondary | ICD-10-CM | POA: Diagnosis not present

## 2017-12-06 DIAGNOSIS — E785 Hyperlipidemia, unspecified: Secondary | ICD-10-CM | POA: Diagnosis not present

## 2017-12-06 MED ORDER — RANEXA 1000 MG PO TB12: 1000.0000 mg | ORAL_TABLET | Freq: Two times a day (BID) | ORAL | 3 refills | Status: DC

## 2017-12-06 NOTE — Progress Notes (Signed)
Office Visit    Patient Name: Kevin [name] Date of Encounter: 12/06/2017  Primary Care Provider:  Carmon Ginsberg, [place] Primary Cardiologist:  Ida Rogue, MD  Chief Complaint    62 year old male with a history of CAD status post four-vessel bypass and subsequent LAD stenting, paroxysmal atrial fibrillation status post Maze procedure, PFO status post closure, stroke with resultant memory deficits, hypertension, hyperlipidemia, and ckd ii-iii, who presents for follow-up related to recurrent angina.  Past Medical History    Past Medical History:  Diagnosis Date  . Brachial plexopathy   . CKD (chronic kidney disease), stage II - III ([place])   . Coronary artery disease    a. s/p 4 vessel CABG in 05/2008 w/ LIMA-LAD, SVG-Diag, sequential SVG-OM1/OM2; b. s/p PCI/DES x 2 to LAD in 10/2009; c. LHC 03/2013 stable disease and patent LAD stents; d. 10/2016 Cath: LM min irregs, LAD 20p, 104m ISR, D2 80ost, LCX small, min irregs, OM1/2/3 min irregs, RCA/RPDA/RPAV min irregs, RPL1/2 nl RPL3 min irregs, VG->D2 nl, VG->OM1->OM2 100, EF 55-65%.  . H/O maze procedure 05/17/2008   a. @ time of CABG  . Hyperlipidemia   . 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   . Hypertension   . Memory deficit after cerebral infarction   . OSA (obstructive sleep apnea)   . Overweight(278.02)   . PAF (paroxysmal atrial fibrillation) ([place])    a. s/p Maze 05/2008, previously on Eliquis->discontinued 05/2016; c. CHADS2VASc => 4 (HTN, stroke x 2, vascular disease)  . Patent foramen ovale    a. 05/2008 s/p closure @ time of CABG.  . Psoriatic arthritis ([place])   . Sleep apnea   . Stroke [facility])    a. 07/2005 right brain; b. 05/2005 left brain; c. Resultant memory deficits.  . Thrombocytopenia ([place])    Past Surgical History:  Procedure Laterality Date  . ACUTE PANCREATITIS  5/11  . APPENDECTOMY  1984  . CHOLECYSTECTOMY  08/19/2009  . COLONOSCOPY WITH PROPOFOL N/A 06/07/2017   Procedure:  COLONOSCOPY WITH PROPOFOL;  Surgeon: Virgel Manifold, MD;  Location: [facility] ENDOSCOPY;  Service: Endoscopy;  Laterality: N/A;  . CORONARY ARTERY BYPASS GRAFT  05/12/2008   x4 by PeterVan Trigt,MD  . LEFT HEART CATH AND CORONARY ANGIOGRAPHY N/A 11/29/2016   Procedure: LEFT HEART CATH AND CORONARY ANGIOGRAPHY;  Surgeon: Wellington Hampshire, MD;  Location: [facility];  Service: Cardiovascular;  Laterality: N/A;  . PATENT FORAMEN OVALE CLOSURE    . TONSILLECTOMY     AS A CHILD    Allergies  Allergies  Allergen Reactions  . Strawberry Extract Anaphylaxis       . Dilaudid [Hydromorphone Hcl] Nausea And Vomiting    History of Present Illness    62 year old male with the above complex past medical history including CAD, paroxysmal atrial fibrillation, PFO, strokes, hypertension, hyperlipidemia, and stage II - III chronic kidney disease.  Cardiac history dates back to February 2010, when he underwent CABG x4.  At the time, he also underwent maze and PFO closure.  Due to recurrent angina, he required diagnostic catheterization in August 2011 revealing an atretic LIMA to the LAD and an occlusion of the sequential graft to the OM1 and OM 2.  At that time, he underwent PCI and drug-eluting stent placement to the native LAD.  He has had intermittent chest pain over the years and has been maintained on long-acting nitrate and Ranexa therapy.  He underwent  diagnostic catheterization in August 2018 due to recurrent rest and exertional chest discomfort.  This showed stable anatomy with patent LAD stents as well as patency of the vein graft to the diagonal and native RCA.  He has been medically managed since.    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  He says he is gained about 20 pounds and feels as though he has become more out of shape.  About 2 months ago, he and his wife began to note that he was having more rest and exertional  chest discomfort, which would typically last about 5 minutes, and resolve spontaneously.  He has not required sublingual nitroglycerin.  This past weekend, [place] went to the [place] and he seemed to have more exertional chest discomfort with less demanding activity.  Again, symptoms would last a few minutes and abate with rest.  His wife recognize that his Ranexa was switched from name brand to generic about 2 months ago and feels that symptoms may be in relation to this change.  As a result, follow-up was made for today.  He has not been having any heart failure symptoms and denies palpitations, dizziness, syncope, edema, PND, orthopnea, or early satiety.  Home Medications    Prior to Admission medications   Medication Sig Start Date End Date Taking? Authorizing Provider  ALPRAZolam Duanne Moron) 0.5 MG tablet TAKE 1 TO 2 TABLETS BY MOUTH NIGHTLY AT BEDTIME 11/27/17  Yes Carmon Ginsberg, PA  aspirin EC 81 MG tablet Take 81 mg by mouth daily.   Yes [provider]  cetirizine (ZYRTEC) 10 MG tablet Take 10 mg by mouth at bedtime.    Yes [provider]  clobetasol ointment (TEMOVATE) 7.42 % Apply 1 application topically 2 (two) times daily as needed (for psoriasis).   Yes [provider]  clopidogrel (PLAVIX) 75 MG tablet TAKE 1 TABLET (75 MG TOTAL) BY MOUTH DAILY. 05/20/17  Yes Gollan, Kathlene November, MD  desonide (DESOWEN) 0.05 % cream Apply 1 application topically 2 (two) times daily as needed (for psoriasis).   Yes [provider]  escitalopram (LEXAPRO) 20 MG tablet Take 1 tablet (20 mg total) by mouth daily. 04/04/17  Yes Carmon Ginsberg, PA  isosorbide mononitrate (IMDUR) 30 MG 24 hr tablet TAKE 1 TABLET BY MOUTH ONCE DAILY 06/07/17  Yes Gollan, Kathlene November, MD  metoprolol tartrate (LOPRESSOR) 25 MG tablet TAKE 1 TABLET BY MOUTH TWICE DAILY 11/11/17  Yes Dunn, Areta Haber, PA-C  nitroGLYCERIN (NITROSTAT) 0.4 MG SL tablet Place 1 tablet (0.4 mg total) under the tongue every 5 (five)  minutes as needed. May repeat for up to 3 doses. 05/15/16  Yes Gollan, Kathlene November, MD  pantoprazole (PROTONIX) 20 MG tablet TAKE 1 TABLET BY MOUTH TWICE DAILY BEFORE A MEAL. 11/11/17  Yes [name] B, MD  ranolazine (RANEXA) 1000 MG SR tablet TAKE 1 TABLET BY MOUTH 2 TIMES DAILY 12/03/17  Yes Gollan, Kathlene November, MD  rosuvastatin (CRESTOR) 40 MG tablet TAKE 1 TABLET BY MOUTH DAILY 08/21/17  Yes Gollan, Kathlene November, MD  ezetimibe (ZETIA) 10 MG tablet TAKE 1 TABLET (10 MG TOTAL) BY MOUTH DAILY. 05/20/17 08/18/17  Minna Merritts, MD    Review of Systems    Rest and exertional chest discomfort has increased above what he would normally expect to experience over the past 2 months.  This is associated with dyspnea.  He has not been having palpitations, PND,  orthopnea, dizziness, syncope, edema, or early satiety.  All other systems reviewed and are otherwise negative except as noted above.  Physical Exam    VS:  BP 112/62 (BP Location: Left Arm, Patient Position: Sitting, Cuff Size: Normal)   Pulse (!) 57   Ht 5\' 6"  (1.676 m)   Wt 199 lb 4 oz (90.4 kg)   BMI 32.16 kg/m  , BMI Body mass index is 32.16 kg/m. GEN: Well nourished, well developed, in no acute distress. HEENT: normal. Neck: Supple, no JVD, carotid bruits, or masses. Cardiac: RRR, no murmurs, rubs, or gallops. No clubbing, cyanosis, edema.  Radials/DP/PT 2+ and equal bilaterally.  Respiratory:  Respirations regular and unlabored, clear to auscultation bilaterally. GI: Soft, nontender, nondistended, BS + x 4. MS: no deformity or atrophy. Skin: warm and dry, no rash. Neuro:  Strength and sensation are intact. Psych: Normal affect.  Accessory Clinical Findings    ECG personally reviewed by me today -sinus bradycardia, 57 incomplete right bundle- no acute changes.  Assessment & Plan    1.  Unstable angina/coronary artery disease: History of CABG with known occlusion of the sequential vein graft to the OM1 and OM 2, as well as an  atretic LIMA to the LAD.  Last catheterization in August 2018 showed patent LAD stent, patent vein graft to the diagonal, and patent native right coronary.  He has been medically managed since with isosorbide and Ranexa and was occasionally experiencing rest or exertional chest discomfort several times per month.  His Ranexa was switched from name brand to generic approximately 2 months ago and since then, he has also been noticing an increase in rest and exertional chest discomfort lasting about 5 minutes, and resolving spontaneously.  He has not required sublingual nitroglycerin.  His wife is interested in a prescription for name brand only Ranexa and we will fill this today but given progression of symptoms, I will also schedule him for an exercise Myoview to assess for ischemia.  If Myoview abnormal or if symptoms persist despite change to namebrand Ranexa, we would have to plan on diagnostic catheterization.  Continue aspirin, Plavix, Zetia, nitrate, beta-blocker, and statin therapy.  2.  Essential hypertension: Well-controlled.  3.  Hyperlipidemia: His LDL was 81 in February 2018.  He remains on rosuvastatin and Zetia.  We will plan to discuss repeat lipids and LFTs at follow-up and consider PCSK9 inhibitor at that point.  4.  Stage II-III chronic kidney disease: Creatinine was 1.42 earlier this year.  Will need to keep this in mind if he ends up requiring diagnostic catheterization.  5.  History of strokes with memory deficits: He has difficulty pinpointing when chest discomfort started or how often it occurs for his wife was available today to fill in the blanks.  6.  Disposition: Follow-up stress testing next week.  We will plan to see back in clinic in approximately 2 to 3 weeks to go over testing and reevaluate symptoms on namebrand Ranexa.  Will need follow-up lipids and LFTs in the near future.   Murray Hodgkins, NP 12/06/2017, 11:03 AM

## 2017-12-06 NOTE — Patient Instructions (Addendum)
Medication Instructions:  Your physician recommends that you continue on your current medications as directed. Please refer to the Current Medication list given to you today.   Labwork: none  Testing/Procedures: Your physician has requested that you have en exercise stress myoview. For further information please visit [email or web address]. Please follow instruction sheet, as given.  [place]  Your caregiver has ordered a Stress Test with nuclear imaging. The purpose of this test is to evaluate the blood supply to your heart muscle. This procedure is referred to as a "Non-Invasive Stress Test." This is because other than having an IV started in your vein, nothing is inserted or "invades" your body. Cardiac stress tests are done to find areas of poor blood flow to the heart by determining the extent of coronary artery disease (CAD). Some patients exercise on a treadmill, which naturally increases the blood flow to your heart, while others who are  unable to walk on a treadmill due to physical limitations have a pharmacologic/chemical stress agent called Lexiscan . This medicine will mimic walking on a treadmill by temporarily increasing your coronary blood flow.   Please note: these test may take anywhere between 2-4 hours to complete  PLEASE REPORT TO [facility] AT THE FIRST DESK WILL DIRECT YOU WHERE TO GO  Date of Procedure:_____________________________________  Arrival Time for Procedure:______________________________  Instructions regarding medication:   _XX_:  Hold betablocker(METOPROLOL) night before procedure and morning of procedure   PLEASE NOTIFY THE OFFICE AT LEAST 24 HOURS IN ADVANCE IF YOU ARE UNABLE TO KEEP YOUR APPOINTMENT.  [phone] AND  PLEASE NOTIFY NUCLEAR MEDICINE AT [facility] AT LEAST 24 HOURS IN ADVANCE IF YOU ARE UNABLE TO KEEP YOUR APPOINTMENT. [phone]  How to prepare for your Myoview test:  1. Do not eat or drink after  midnight 2. No caffeine for 24 hours prior to test 3. No smoking 24 hours prior to test. 4. Your medication may be taken with water.  If your doctor stopped a medication because of this test, do not take that medication. 5. Ladies, please do not wear dresses.  Skirts or pants are appropriate. Please wear a short sleeve shirt. 6. No perfume, cologne or lotion. 7. Wear comfortable walking shoes. No heels!    Follow-Up: Your physician recommends that you schedule a follow-up appointment in: 2 [place] APP.   If you need a refill on your cardiac medications before your next appointment, please call your pharmacy.    Cardiac Nuclear Scan A cardiac nuclear scan is a test that measures blood flow to the heart when a person is resting and when he or she is exercising. The test looks for problems such as:  Not enough blood reaching a portion of the heart.  The heart muscle not working normally.  You may need this test if:  You have heart disease.  You have had abnormal lab results.  You have had heart surgery or angioplasty.  You have chest pain.  You have shortness of breath.  In this test, a radioactive dye (tracer) is injected into your bloodstream. After the tracer has traveled to your heart, an imaging device is used to measure how much of the tracer is absorbed by or distributed to various areas of your heart. This procedure is usually done at a hospital and takes 2-4 hours. Tell a health care provider about:  Any allergies you have.  All medicines you are taking, including vitamins, herbs, eye drops, creams,  and over-the-counter medicines.  Any problems you or family members have had with the use of anesthetic medicines.  Any blood disorders you have.  Any surgeries you have had.  Any medical conditions you have.  Whether you are pregnant or may be pregnant. What are the risks? Generally, this is a safe procedure. However, problems may occur,  including:  Serious chest pain and heart attack. This is only a risk if the stress portion of the test is done.  Rapid heartbeat.  Sensation of warmth in your chest. This usually passes quickly.  What happens before the procedure?  Ask your health care provider about changing or stopping your regular medicines. This is especially important if you are taking diabetes medicines or blood thinners.  Remove your jewelry on the day of the procedure. What happens during the procedure?  An IV tube will be inserted into one of your veins.  Your health care provider will inject a small amount of radioactive tracer through the tube.  You will wait for 20-40 minutes while the tracer travels through your bloodstream.  Your heart activity will be monitored with an electrocardiogram (ECG).  You will lie down on an exam table.  Images of your heart will be taken for about 15-20 minutes.  You may be asked to exercise on a treadmill or stationary bike. While you exercise, your heart's activity will be monitored with an ECG, and your blood pressure will be checked. If you are unable to exercise, you may be given a medicine to increase blood flow to parts of your heart.  When blood flow to your heart has peaked, a tracer will again be injected through the IV tube.  After 20-40 minutes, you will get back on the exam table and have more images taken of your heart.  When the procedure is over, your IV tube will be removed. The procedure may vary among health care providers and hospitals. Depending on the type of tracer used, scans may need to be repeated 3-4 hours later. What happens after the procedure?  Unless your health care provider tells you otherwise, you may return to your normal schedule, including diet, activities, and medicines.  Unless your health care provider tells you otherwise, you may increase your fluid intake. This will help flush the contrast dye from your body. Drink enough fluid  to keep your urine clear or pale yellow.  It is up to you to get your test results. Ask your health care provider, or the department that is doing the test, when your results will be ready. Summary  A cardiac nuclear scan measures the blood flow to the heart when a person is resting and when he or she is exercising.  You may need this test if you are at risk for heart disease.  Tell your health care provider if you are pregnant.  Unless your health care provider tells you otherwise, increase your fluid intake. This will help flush the contrast dye from your body. Drink enough fluid to keep your urine clear or pale yellow. This information is not intended to replace advice given to you by your health care provider. Make sure you discuss any questions you have with your health care provider. Document Released: 04/13/2004 Document Revised: 03/21/2016 Document Reviewed: 02/25/2013 Elsevier Interactive Patient Education  2017 [employer].

## 2017-12-11 ENCOUNTER — Ambulatory Visit (INDEPENDENT_AMBULATORY_CARE_PROVIDER_SITE_OTHER): Payer: 59 | Admitting: Neurology

## 2017-12-11 DIAGNOSIS — Z8709 Personal history of other diseases of the respiratory system: Secondary | ICD-10-CM

## 2017-12-11 DIAGNOSIS — R0683 Snoring: Secondary | ICD-10-CM

## 2017-12-11 DIAGNOSIS — G4733 Obstructive sleep apnea (adult) (pediatric): Secondary | ICD-10-CM

## 2017-12-11 DIAGNOSIS — R413 Other amnesia: Secondary | ICD-10-CM

## 2017-12-11 DIAGNOSIS — Z87898 Personal history of other specified conditions: Secondary | ICD-10-CM

## 2017-12-11 DIAGNOSIS — R400 Somnolence: Secondary | ICD-10-CM

## 2017-12-13 ENCOUNTER — Telehealth: Payer: Self-pay | Admitting: Neurology

## 2017-12-13 ENCOUNTER — Telehealth: Payer: Self-pay | Admitting: Nurse Practitioner

## 2017-12-13 ENCOUNTER — Ambulatory Visit
Admission: RE | Admit: 2017-12-13 | Discharge: 2017-12-13 | Disposition: A | Discharge status: Home / Self Care | Payer: 59 | Source: Ambulatory Visit | Attending: Nurse Practitioner | Admitting: Nurse Practitioner

## 2017-12-13 DIAGNOSIS — I200 Unstable angina: Secondary | ICD-10-CM | POA: Diagnosis not present

## 2017-12-13 DIAGNOSIS — I25708 Atherosclerosis of coronary artery bypass graft(s), unspecified, with other forms of angina pectoris: Secondary | ICD-10-CM

## 2017-12-13 DIAGNOSIS — I69319 Unspecified symptoms and signs involving cognitive functions following cerebral infarction: Secondary | ICD-10-CM

## 2017-12-13 DIAGNOSIS — G4710 Hypersomnia, unspecified: Secondary | ICD-10-CM

## 2017-12-13 DIAGNOSIS — G4733 Obstructive sleep apnea (adult) (pediatric): Secondary | ICD-10-CM

## 2017-12-13 DIAGNOSIS — I480 Paroxysmal atrial fibrillation: Secondary | ICD-10-CM

## 2017-12-13 LAB — NM MYOCAR MULTI W/SPECT W/WALL MOTION / EF
CHL CUP NUCLEAR SSS: 3
CHL CUP RESTING HR STRESS: 75 {beats}/min
CSEPEDS: 20 s
CSEPHR: 91 %
Estimated workload: 10.1 METS
Exercise duration (min): 8 min
LV dias vol: 59 mL (ref 62–150)
LV sys vol: 17 mL
MPHR: 157 {beats}/min
Peak HR: 144 {beats}/min
SDS: 3
SRS: 0
TID: 0.88

## 2017-12-13 MED ORDER — REGADENOSON 0.4 MG/5ML IV SOLN
0.4000 mg | Freq: Once | INTRAVENOUS | Status: AC
  Administered 2017-12-13: 0.4 mg via INTRAVENOUS

## 2017-12-13 MED ORDER — TECHNETIUM TC 99M TETROFOSMIN IV KIT
30.5600 | PACK | Freq: Once | INTRAVENOUS | Status: AC | PRN
  Administered 2017-12-13: 30.56 via INTRAVENOUS

## 2017-12-13 MED ORDER — TECHNETIUM TC 99M TETROFOSMIN IV KIT
13.0000 | PACK | Freq: Once | INTRAVENOUS | Status: AC | PRN
  Administered 2017-12-13: 14.19 via INTRAVENOUS

## 2017-12-13 NOTE — Telephone Encounter (Signed)
NAME:    Kevin [name]                                             DOB: 1955-12-11 MEDICAL RECORD NUMBER [id number]                               DOS:  12/11/2017 REFERRING PHYSICIAN: Ward Givens, NP for C. Cinde Ebert, MD STUDY PERFORMED: Home Sleep Study by watch pat  HISTORY:  [name] is a 62 year old male patient with a history of memory disturbance following multiple embolic strokes in 8657. A fibrillation history- but Eliquis was d/c 05/2016.   He reports he has significant daytime sleepiness and has noticed being more tired throughout the day.  He states when he was diagnosed with sleep apnea he lost weight and after he was retested by HST was told he did not have apnea anymore ( Original sleep [facility] at [facility] - OSA, retrognathia, sinusitis, rhinitis). His Epworth SS were endorsed at 16/24 and FSS at 52/63 points, both very high. HST did not confirm apnea to be any longer present. He states that since that time he has gained weight and has begun snoring again. He also reports tinnitus in the right ear, present since his stroke.  BMI: 33.1  No Epworth score or FSS documented with last visit.   STUDY RESULTS:  Total Recording Time: 8 hours 50 minutes, valid for 7 h 33 min.  Total Apnea/Hypopnea Index (AHI): 14.9 /h; RDI: 15.8 /h. suggested REM AHI was 21.8/h Average Oxygen Saturation:  94 %, Lowest Oxygen Saturation: 88 %  Total Time in Oxygen Saturation below 89 %: 0.2 minutes.  Heart Rate:  between 46 and 70 bpm.  IMPRESSION: Mild Obstructive sleep apnea confirmed at AHI 14.9/h. with moderate loud snoring (see RDI). No associated hypoxemia, normal heart rate variation.   RECOMMENDATION: I would restart CPAP (I can order a new machine for him) and urge him to lose weight again. Will use a auto CPAP 5-15 cm water, mask of his choice , heated humidity and EPR of 2 cm.  This is a milder apnea form but even mild apnea untreated can cause irregular heartbeats, return to uncontrolled  atrial fibrillation.  I certify that I have reviewed the raw data recording prior to the issuance of this report in accordance with the standards of the American Academy of Sleep Medicine (AASM).   Larey Seat, [name]   12-13-2017   Medical Director of [facility] at [facility], accredited by the AASM. Diplomat of the ABPN and ABSM.

## 2017-12-13 NOTE — Telephone Encounter (Signed)
Pt wife states pt since this past week has been taking  "name brand" Ranexa. States pt symptoms have diminished since he has started this. States he has stopped the generic

## 2017-12-13 NOTE — Procedures (Signed)
NAME:    Kevin [name]                                             DOB: 08-Jul-1955 MEDICAL RECORD NUMBER [id number]                               DOS:  12/11/2017 REFERRING PHYSICIAN: Ward Givens, NP for [name], MD STUDY PERFORMED: Home Sleep Study by watch pat  HISTORY:  [name] is a 62 year old male patient with a history of memory disturbance following multiple embolic strokes in 3646. A fibrillation history- but Eliquis was d/c 05/2016.   He reports he has significant daytime sleepiness and has noticed being more tired throughout the day.  He states when he was diagnosed with sleep apnea he lost weight and after he was retested by HST was told he did not have apnea anymore ( Original sleep study at [facility] - OSA, retrognathia, sinusitis, rhinitis). His Epworth SS endorsed in 2017 at 16/24 and FSS at 52/63 points, both very high. HST did not confirm apnea to be any longer present. He states that since that time he has gained weight and has begun snoring again. He also reports tinnitus in the right ear, present since his stroke.   BMI: 33.1  STUDY RESULTS:  Total Recording Time: 8 hours 50 minutes, valid for 7 h 33 min.  Total Apnea/Hypopnea Index (AHI): 14.9 /h; RDI: 15.8 /h. suggested REM AHI was 21.8/h Average Oxygen Saturation:  94 %, Lowest Oxygen Saturation: 88 %  Total Time in Oxygen Saturation below 89 %: 0.2 minutes.  Heart Rate:  between 46 and 70 bpm.  IMPRESSION: Mild Obstructive sleep apnea confirmed at AHI 14.9/h. with moderate loud snoring (see RDI). No associated hypoxemia, normal heart rate variation.   RECOMMENDATION: I would restart CPAP (I can order a new machine for him) and urge him to lose weight again.  This is a milder apnea form but even mild apnea untreated can cause irregular heartbeats, return to uncontrolled atrial fibrillation.  I certify that I have reviewed the raw data recording prior to the issuance of this report in accordance with the  standards of the American Academy of Sleep Medicine (AASM). Larey Seat, [name]      Medical Director of [facility] at [facility], accredited by the AASM. Diplomat of the ABPN and ABSM.

## 2017-12-13 NOTE — Telephone Encounter (Signed)
Spoke with pharmacy rep and she states that they will need to fax [place] a MED watch form and then another prior authorization to be completed. Provided her with our fax number information and will complete these when they are received.

## 2017-12-13 NOTE — Telephone Encounter (Signed)
Spoke with patients wife and reviewed that we are attempting to get this covered.

## 2017-12-13 NOTE — Telephone Encounter (Signed)
Reached out to patients pharmacy and also our pharmacy and they need [place] to call in with this information.

## 2017-12-16 ENCOUNTER — Ambulatory Visit: Payer: 59 | Admitting: Cardiovascular Disease

## 2017-12-16 NOTE — Telephone Encounter (Signed)
Called patient to discuss sleep study results. No answer at this time. LVM for the patient to call back.   

## 2017-12-16 NOTE — Telephone Encounter (Signed)
Patient wife returned call and I was able to discuss sleep study results I advised pt's wife that Dr. Brett Fairy reviewed their sleep study results and found that pt has sleep apnea. Dr. Brett Fairy recommends that pt starts a auto CPAP. I reviewed PAP compliance expectations with the pt. Pt is agreeable to starting a CPAP. I advised pt that an order will be sent to a DME, [facility], and [facility] will call the pt. [facility] will show the pt how to use the machine, fit for masks, and troubleshoot the CPAP if needed. A follow up appt was made for insurance purposes with Ward Givens, NP on Nov 1,2019 at 9 am . Pt verbalized understanding to arrive 15 minutes early and bring their CPAP. A letter with all of this information in it will be mailed to the pt as a reminder. I verified with the pt that the address we have on file is correct. Pt verbalized understanding of results. Pt had no questions at this time but was encouraged to call back if questions arise.

## 2017-12-29 NOTE — Progress Notes (Signed)
Cardiology Office Note  Date:  12/31/2017   ID:  Kevin [name], Kevin [name] [birth year], MRN [id number]  PCP:  Carmon Ginsberg, [place]   Chief Complaint  Patient presents with  . other    2 wk f/u no complaints today. Meds reviewed verbally with pt.    HPI:  Kevin [name] is a 62 y/o male with h/o  CVA, memory problems CAD,  atrial fib,  HTN,  hyperlipidemia,  OSA and previous CVA,   CABG with Maze and PFO closure in February 2010,   cath in 10/2008  for exertional dyspnea and CP showing 2-V CAD.  LAD 95% mid, LCX small RCA large OK.  SVG -> OM1 and OM2 was occluded. SVG-Diag was widely patent and backfilled the LAD well. LIMA - LAD was atretic. Myoview to assess LAD for ischemia with very mild reversible defect in very distal anterior wall and apex. Decision made to manage him medically, continued chest pain and cath Aug 9th 2011 with PCI of the LAD with 2 stents placed.  Cath 11/29/16 for chest pain, medical management He presents today for follow-up of his coronary artery disease  n follow-up today  he presents with his wife he reports that he is been doing very well No exercise program, continues to work long hours,  Does not use nitroglycerin Chroniclow-grade memory issue  More chest pain on generic ranexa Realized when he was on vasaction Trying to get brand name ranexa  Recent stress test December 13, 2017 Exercise myocardial perfusion imaging study with no significant ischemia Small region of mild fixed perfusion defect distal inferolateral wall, apical inferolateral consistent with previous MI/scar Perfusion defect is less notable on non-attenuation corrected images Normal wall motion, EF estimated at 65% No EKG changes concerning for ischemia at peak stress or in recovery. Target heart rate achieved Low risk scan   cardiac catheterization performed by Dr. Fletcher Anon August 2018 for stuttering chest pain Cath reviewed Significant underlying one-vessel coronary artery disease with  patent SVG to second diagonal, patent LAD stent with mild in-stent restenosis and known occluded SVG to OM. Native RCA and left circumflex don't have obstructive disease. Normal LV systolic function and mildly elevated left ventricular end-diastolic pressure.   total cholesterol 149, LDL 80, in 2018 Tolerating statin with Zetia  EKG personally reviewed by myself on todays visit Shows sinus bradycardia rate 56 bpm no significant ST-T wave changes  Other past medical history reviewed Stress test June 2016 showed no ischemia, normal ejection fraction.  Cardiac catheterization last week at the end of December 2014 showing no significant change in his disease, patent stents in the LAD.  He did have nonsustained VT during the case. Impressive run of ventricular arrhythmia. He is asymptomatic at the time. This was recorded on telemetry and presented to show the family. On further discussion, it was uncertain if arrhythmia was causing his episodes of chest pain. 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  PSH:    Past Surgical History:  Procedure Laterality Date  . ACUTE PANCREATITIS  5/11  . APPENDECTOMY  1984  . CHOLECYSTECTOMY  08/19/2009  . COLONOSCOPY WITH PROPOFOL N/A 06/07/2017   Procedure: COLONOSCOPY WITH PROPOFOL;  Surgeon: Virgel Manifold, MD;  Location: [facility] ENDOSCOPY;  Service: Endoscopy;  Laterality: N/A;  . CORONARY ARTERY BYPASS GRAFT  05/12/2008   x4 by  PeterVan Trigt,MD  . LEFT HEART CATH AND CORONARY ANGIOGRAPHY N/A 11/29/2016   Procedure: LEFT HEART CATH AND CORONARY ANGIOGRAPHY;  Surgeon: Wellington Hampshire, MD;  Location: [facility];  Service: Cardiovascular;  Laterality: N/A;  . PATENT FORAMEN OVALE CLOSURE    . TONSILLECTOMY     AS A CHILD    Current Outpatient Medications  Medication Sig Dispense Refill  . ALPRAZolam (XANAX) 0.5 MG tablet TAKE 1 TO 2 TABLETS BY MOUTH NIGHTLY AT BEDTIME 60 tablet 5  . aspirin EC 81 MG tablet Take 81 mg by mouth daily.    . cetirizine (ZYRTEC) 10 MG tablet Take 10 mg by mouth at bedtime.     . clobetasol ointment (TEMOVATE) 0.73 % Apply 1 application topically 2 (two) times daily as needed (for psoriasis).    . clopidogrel (PLAVIX) 75 MG tablet TAKE 1 TABLET (75 MG TOTAL) BY MOUTH DAILY. 90 tablet 2  . desonide (DESOWEN) 0.05 % cream Apply 1 application topically 2 (two) times daily as needed (for psoriasis).    Marland Kitchen escitalopram (LEXAPRO) 20 MG tablet Take 1 tablet (20 mg total) by mouth daily. 90 tablet 3  . ezetimibe (ZETIA) 10 MG tablet TAKE 1 TABLET (10 MG TOTAL) BY MOUTH DAILY. 90 tablet 2  . isosorbide mononitrate (IMDUR) 30 MG 24 hr tablet TAKE 1 TABLET BY MOUTH ONCE DAILY 90 tablet 3  . metoprolol tartrate (LOPRESSOR) 25 MG tablet TAKE 1 TABLET BY MOUTH TWICE DAILY 180 tablet 3  . nitroGLYCERIN (NITROSTAT) 0.4 MG SL tablet Place 1 tablet (0.4 mg total) under the tongue every 5 (five) minutes as needed. May repeat for up to 3 doses. 25 tablet 6  . pantoprazole (PROTONIX) 20 MG tablet TAKE 1 TABLET BY MOUTH TWICE DAILY BEFORE A MEAL. 60 tablet 1  . RANEXA 1000 MG SR tablet Take 1 tablet (1,000 mg total) by mouth 2 (two) times daily. 180 tablet 3  . rosuvastatin (CRESTOR) 40 MG tablet TAKE 1 TABLET BY MOUTH DAILY 90 tablet 1   No current facility-administered medications for this visit.      Allergies:   Strawberry extract and Dilaudid [hydromorphone hcl]   Social History:  The  patient  reports that he has never smoked. He has never used smokeless tobacco. He reports that he does not drink alcohol or use drugs.   Family History:   family history includes Cancer in his paternal uncle; Coronary artery disease in his other; Diabetes in his other, other, and sister; Heart attack in his father; Heart disease in his mother, paternal grandfather, and paternal grandmother; Hyperlipidemia in his other; Hypertension in his mother and other; Lung cancer in his paternal aunt; Ovarian cancer in his sister; Psychiatric Illness in his father.    Review of Systems: Review of Systems  Constitutional: Negative.   Respiratory: Negative.   Cardiovascular: Positive for chest pain.       Rare episodes, does not remember how often, intensity, timing  Gastrointestinal: Negative.   Musculoskeletal: Negative.   Neurological: Negative.   Psychiatric/Behavioral: Negative.   All other systems reviewed and are negative.    PHYSICAL EXAM: VS:  BP 98/60 (BP Location: Left Arm, Patient Position: Sitting, Cuff Size: Normal)   Pulse (!) 56  Ht 5\' 6"  (1.676 m)   Wt 194 lb 12 oz (88.3 kg)   BMI 31.43 kg/m  , BMI Body mass index is 31.43 kg/m. Constitutional:  oriented to person, place, and time. No distress.  HENT:  Head: Normocephalic and atraumatic.  Eyes:  no discharge. No scleral icterus.  Neck: Normal range of motion. Neck supple. No JVD present.  Cardiovascular: Normal rate, regular rhythm, normal heart sounds and intact distal pulses. Exam reveals no gallop and no friction rub. No edema No murmur heard. Pulmonary/Chest: Effort normal and breath sounds normal. No stridor. No respiratory distress.  no wheezes.  no rales.  no tenderness.  Abdominal: Soft.  no distension.  no tenderness.  Musculoskeletal: Normal range of motion.  no  tenderness or deformity.  Neurological:  normal muscle tone. Coordination normal. No atrophy Skin: Skin is warm and dry. No rash noted. not diaphoretic.   Psychiatric:  normal mood and affect. behavior is normal. Thought content normal.   Recent Labs: 04/01/2017: ALT 17; Hemoglobin 13.6; Platelets 228; Potassium 4.8; Sodium 138 09/25/2017: BUN 28; Creatinine, Ser 1.42    Lipid Panel Lab Results  Component Value Date   CHOL 149 05/07/2016   HDL 42 05/07/2016   LDLCALC 81 05/07/2016   TRIG 130 05/07/2016      Wt Readings from Last 3 Encounters:  12/31/17 194 lb 12 oz (88.3 kg)  12/06/17 199 lb 4 oz (90.4 kg)  11/21/17 198 lb (89.8 kg)     ASSESSMENT AND PLAN:  Coronary artery disease of native artery of native heart with stable angina pectoris ([place]) - Plan: EKG 12-Lead Chronic chest pain, better on brand named ranexa. Wife noticed this when they were on vacation together No further testing at this time Catheterization last year showed stable disease and stress test recently showing small fixed area of defect in the region of occluded vein graft to the OM 2   Essential hypertension - Plan: EKG 12-Lead Blood pressure running low Discussed with patient's wife cutting back on metoprolol down to 12.5 mg twice daily She will monitor pressures for now increased hydration  Paroxysmal atrial fibrillation ([facility]) Previous maze procedure , denies any tachycardia palpitations concerning for arrhythmia .  Maintaining normal rhythm  Hyperlipidemia Lab work drawn today Goal LDL ideally 60 or less  PATENT FORAMEN OVALE  previous PFO closure   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HCC)  as above, symptoms stable, nitroglycerin renewed Cardiac catheterization results reviewed, detailed above   Total encounter time more than 25 minutes  Greater than 50% was spent in counseling and coordination of care with the patient   Disposition:   F/U  12 months   Orders Placed This Encounter  Procedures  . EKG 12-Lead     Signed, Esmond Plants, [name], Ph.D. 12/31/2017  [place], [place]

## 2017-12-31 ENCOUNTER — Ambulatory Visit (INDEPENDENT_AMBULATORY_CARE_PROVIDER_SITE_OTHER): Payer: 59 | Admitting: Cardiovascular Disease

## 2017-12-31 ENCOUNTER — Encounter: Payer: Self-pay | Admitting: Cardiovascular Disease

## 2017-12-31 VITALS — BP 98/60 | HR 56 | Ht 66.0 in | Wt 194.8 lb

## 2017-12-31 DIAGNOSIS — I10 Essential (primary) hypertension: Secondary | ICD-10-CM | POA: Diagnosis not present

## 2017-12-31 DIAGNOSIS — I639 Cerebral infarction, unspecified: Secondary | ICD-10-CM | POA: Diagnosis not present

## 2017-12-31 DIAGNOSIS — G4733 Obstructive sleep apnea (adult) (pediatric): Secondary | ICD-10-CM | POA: Diagnosis not present

## 2017-12-31 DIAGNOSIS — E785 Hyperlipidemia, unspecified: Secondary | ICD-10-CM | POA: Diagnosis not present

## 2017-12-31 DIAGNOSIS — I480 Paroxysmal atrial fibrillation: Secondary | ICD-10-CM

## 2017-12-31 DIAGNOSIS — Z23 Encounter for immunization: Secondary | ICD-10-CM | POA: Diagnosis not present

## 2017-12-31 DIAGNOSIS — I25110 Atherosclerotic heart disease of native coronary artery with unstable angina pectoris: Secondary | ICD-10-CM | POA: Diagnosis not present

## 2017-12-31 NOTE — Telephone Encounter (Signed)
Completed prior authorization through [email or web address] and it is pending review.

## 2017-12-31 NOTE — Patient Instructions (Addendum)
Medication Instructions:   No medication changes made  Labwork:  We will draw a lipids and a LFTS  Testing/Procedures:  No further testing at this time   Follow-Up: It was a pleasure seeing you in the office today. Please call us if you have new issues that need to be addressed before your next appt.  [phone]  Your physician wants you to follow-up in: 12 months.  You will receive a reminder letter in the mail two months in advance. If you don't receive a letter, please call our office to schedule the follow-up appointment.  If you need a refill on your cardiac medications before your next appointment, please call your pharmacy.  For educational health videos Log in to : [email or web address] Or : [email or web address], password : [email or web address]

## 2018-01-01 DIAGNOSIS — G4733 Obstructive sleep apnea (adult) (pediatric): Secondary | ICD-10-CM | POA: Diagnosis not present

## 2018-01-01 LAB — LIPID PANEL
Chol/HDL Ratio: 2.2 ratio (ref 0.0–5.0)
Cholesterol, Total: 112 mg/dL (ref 100–199)
HDL: 50 mg/dL
LDL Calculated: 40 mg/dL (ref 0–99)
Triglycerides: 112 mg/dL (ref 0–149)
VLDL Cholesterol Cal: 22 mg/dL (ref 5–40)

## 2018-01-01 LAB — HEPATIC FUNCTION PANEL
ALT: 15 IU/L (ref 0–44)
AST: 20 IU/L (ref 0–40)
Albumin: 4.6 g/dL (ref 3.6–4.8)
Alkaline Phosphatase: 43 IU/L (ref 39–117)
Bilirubin Total: 0.7 mg/dL (ref 0.0–1.2)
Bilirubin, Direct: 0.2 mg/dL (ref 0.00–0.40)
Total Protein: 6.9 g/dL (ref 6.0–8.5)

## 2018-01-03 NOTE — Telephone Encounter (Signed)
Fax received- placed on Pam's desk.

## 2018-01-03 NOTE — Telephone Encounter (Signed)
Patient wife calling to let us know Josem Kaufmann was denied a fax is coming and to make sure to include patient had a reaction to the generic in the appeal

## 2018-01-08 ENCOUNTER — Telehealth: Payer: Self-pay | Admitting: Adult Health

## 2018-01-08 NOTE — Telephone Encounter (Signed)
That's >30 days right?

## 2018-01-08 NOTE — Telephone Encounter (Signed)
Has appt 01/31/2018 for initial cpap.  Would this be ok?

## 2018-01-08 NOTE — Telephone Encounter (Signed)
Pts wife [name]) called stating the pt recently got his CPAP on 10/5 needing to r/s follow up for 30-91 days. NP nor MD have anything available as of right now please advise

## 2018-01-08 NOTE — Telephone Encounter (Signed)
Spoke to wife of pt and made appt 02-20-18 for initial cpap visit.

## 2018-01-10 NOTE — Telephone Encounter (Signed)
Paperwork with provider notes faxed to numbers listed on paperwork.

## 2018-01-14 ENCOUNTER — Telehealth: Payer: Self-pay | Admitting: *Deleted

## 2018-01-14 NOTE — Telephone Encounter (Signed)
Prior authorization approval for Ranexa 1000 mg received from [place]. Effective for a maximum of 12 refills from 12/31/2017 to 12/31/2018.

## 2018-01-15 NOTE — Telephone Encounter (Signed)
Spoke with Employee pharmacy and they said that what we submitted was approved. Will close encounter now.

## 2018-01-20 ENCOUNTER — Other Ambulatory Visit: Payer: Self-pay | Admitting: Gastroenterology

## 2018-01-31 ENCOUNTER — Ambulatory Visit: Payer: Self-pay | Admitting: Adult Health

## 2018-02-01 DIAGNOSIS — G4733 Obstructive sleep apnea (adult) (pediatric): Secondary | ICD-10-CM | POA: Diagnosis not present

## 2018-02-18 ENCOUNTER — Encounter: Payer: Self-pay | Admitting: Neurology

## 2018-02-19 ENCOUNTER — Other Ambulatory Visit: Payer: Self-pay | Admitting: Cardiovascular Disease

## 2018-02-20 ENCOUNTER — Encounter: Payer: Self-pay | Admitting: Adult Health

## 2018-02-20 ENCOUNTER — Ambulatory Visit (INDEPENDENT_AMBULATORY_CARE_PROVIDER_SITE_OTHER): Payer: 59 | Admitting: Adult Health

## 2018-02-20 VITALS — BP 115/65 | HR 54 | Ht 66.0 in | Wt 199.0 lb

## 2018-02-20 DIAGNOSIS — Z9989 Dependence on other enabling machines and devices: Secondary | ICD-10-CM

## 2018-02-20 DIAGNOSIS — G4733 Obstructive sleep apnea (adult) (pediatric): Secondary | ICD-10-CM | POA: Diagnosis not present

## 2018-02-20 NOTE — Progress Notes (Signed)
PATIENT: Kevin [name] DOB: 1955-12-14  REASON FOR VISIT: follow up HISTORY FROM: patient  HISTORY OF PRESENT ILLNESS: Today 02/20/18:  Kevin [name] is a 62 year old male with a history of memory disturbance and sleep apnea.  He returns today for his first CPAP download.  His download indicates that he uses that she 27 out of 30 days for compliance of 90% and >4 hours 86.7 % of the time. Residual AHI is 3.1 on 5-15 cm H20. He reports that it is working well. He has only been using it for about 1 month. Would like the humidity increased. He returns today for follow-up.   HISTORY 11/21/17: [name] is a 62 year old male with a history of memory disturbance.  He returns today for follow-up.  He feels that his memory has remained relatively stable however his wife notes that she has noticed some changes.  She notes that he is a conversation.  He is able to complete all ADLs independently.  He reports that he operates a motor vehicle without difficulty.  He is still able to manage the finances without difficulty.  Reports good appetite.  He reports that his sleep varies.  He states that he never  Feels rested. he states that he has significant daytime sleepiness and has noticed being more tired throughout the day.  He states in the past he was diagnosed with sleep apnea but reports that he lost weight and when he was retested he was told he did not have apnea..  He states that since that time he has gained weight and has begin snoring again.  He also reports tinnitus in the right ear.  He reports that this is been present since his stroke.  He has never had it evaluated.  He describes it as a high-pitched whine.  He states that it is there continuously.  He returns today for evaluation.  REVIEW OF SYSTEMS: Out of a complete 14 system review of symptoms, the patient complains only of the following symptoms, and all other reviewed systems are negative.  Epworth sleepiness score is 13 Bruise/bleed easily,  ringing in ears, apnea, snoring, joint pain  ALLERGIES: Allergies  Allergen Reactions  . Strawberry Extract Anaphylaxis       . Dilaudid [Hydromorphone Hcl] Nausea And Vomiting    HOME MEDICATIONS: Outpatient Medications Prior to Visit  Medication Sig Dispense Refill  . ALPRAZolam (XANAX) 0.5 MG tablet TAKE 1 TO 2 TABLETS BY MOUTH NIGHTLY AT BEDTIME 60 tablet 5  . aspirin EC 81 MG tablet Take 81 mg by mouth daily.    . cetirizine (ZYRTEC) 10 MG tablet Take 10 mg by mouth at bedtime.     . clobetasol ointment (TEMOVATE) 0.25 % Apply 1 application topically 2 (two) times daily as needed (for psoriasis).    . clopidogrel (PLAVIX) 75 MG tablet TAKE 1 TABLET (75 MG TOTAL) BY MOUTH DAILY. 90 tablet 2  . desonide (DESOWEN) 0.05 % cream Apply 1 application topically 2 (two) times daily as needed (for psoriasis).    Marland Kitchen escitalopram (LEXAPRO) 20 MG tablet Take 1 tablet (20 mg total) by mouth daily. 90 tablet 3  . ezetimibe (ZETIA) 10 MG tablet TAKE 1 TABLET BY MOUTH DAILY 90 tablet 1  . isosorbide mononitrate (IMDUR) 30 MG 24 hr tablet TAKE 1 TABLET BY MOUTH ONCE DAILY 90 tablet 3  . metoprolol tartrate (LOPRESSOR) 25 MG tablet TAKE 1 TABLET BY MOUTH TWICE DAILY 180 tablet 3  . nitroGLYCERIN (NITROSTAT) 0.4 MG SL  tablet Place 1 tablet (0.4 mg total) under the tongue every 5 (five) minutes as needed. May repeat for up to 3 doses. 25 tablet 6  . pantoprazole (PROTONIX) 20 MG tablet TAKE 1 TABLET BY MOUTH TWICE DAILY BEFORE A MEAL. 60 tablet 1  . RANEXA 1000 MG SR tablet Take 1 tablet (1,000 mg total) by mouth 2 (two) times daily. 180 tablet 3  . rosuvastatin (CRESTOR) 40 MG tablet TAKE 1 TABLET BY MOUTH DAILY 90 tablet 1   No facility-administered medications prior to visit.     PAST MEDICAL HISTORY: Past Medical History:  Diagnosis Date  . Brachial plexopathy   . CKD (chronic kidney disease), stage II - III ([place])   . Coronary artery disease    a. s/p 4 vessel CABG in 05/2008 w/ LIMA-LAD,  SVG-Diag, sequential SVG-OM1/OM2; b. s/p PCI/DES x 2 to LAD in 10/2009; c. LHC 03/2013 stable disease and patent LAD stents; d. 10/2016 Cath: LM min irregs, LAD 20p, 72m ISR, D2 80ost, LCX small, min irregs, OM1/2/3 min irregs, RCA/RPDA/RPAV min irregs, RPL1/2 nl RPL3 min irregs, VG->D2 nl, VG->OM1->OM2 100, EF 55-65%.  . H/O maze procedure 05/17/2008   a. @ time of CABG  . Hyperlipidemia   . 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   . Hypertension   . Memory deficit after cerebral infarction   . OSA (obstructive sleep apnea)   . Overweight(278.02)   . PAF (paroxysmal atrial fibrillation) ([place])    a. s/p Maze 05/2008, previously on Eliquis->discontinued 05/2016; c. CHADS2VASc => 4 (HTN, stroke x 2, vascular disease)  . Patent foramen ovale    a. 05/2008 s/p closure @ time of CABG.  . Psoriatic arthritis ([place])   . Sleep apnea   . Stroke [facility])    a. 07/2005 right brain; b. 05/2005 left brain; c. Resultant memory deficits.  . Thrombocytopenia ([place])     PAST SURGICAL HISTORY: Past Surgical History:  Procedure Laterality Date  . ACUTE PANCREATITIS  5/11  . APPENDECTOMY  1984  . CHOLECYSTECTOMY  08/19/2009  . COLONOSCOPY WITH PROPOFOL N/A 06/07/2017   Procedure: COLONOSCOPY WITH PROPOFOL;  Surgeon: Virgel Manifold, MD;  Location: [facility] ENDOSCOPY;  Service: Endoscopy;  Laterality: N/A;  . CORONARY ARTERY BYPASS GRAFT  05/12/2008   x4 by PeterVan Trigt,MD  . LEFT HEART CATH AND CORONARY ANGIOGRAPHY N/A 11/29/2016   Procedure: LEFT HEART CATH AND CORONARY ANGIOGRAPHY;  Surgeon: Wellington Hampshire, MD;  Location: [place] [facility];  Service: Cardiovascular;  Laterality: N/A;  . PATENT FORAMEN OVALE CLOSURE    . TONSILLECTOMY     AS A CHILD    FAMILY HISTORY: Family History  Problem Relation Age of Onset  . Diabetes Sister   . Ovarian cancer Sister   . Psychiatric Illness Father   . Heart attack Father   . Diabetes Other   . Hypertension Mother   .  Heart disease Mother   . Lung cancer Paternal Aunt   . Coronary artery disease Other   . Diabetes Other   . Hypertension Other   . Hyperlipidemia Other   . Cancer Paternal Uncle   . Heart disease Paternal Grandmother   . Heart disease Paternal Grandfather     SOCIAL HISTORY: Social History   Socioeconomic History  . Marital status: Married    Spouse name: [name]  . Number of children: 3  . Years of education: Not on file  . Highest  education level: Not on file  Occupational History  . Occupation: Full time    Employer: [employer]  Social Needs  . Financial resource strain: Not on file  . Food insecurity:    Worry: Not on file    Inability: Not on file  . Transportation needs:    Medical: Not on file    Non-medical: Not on file  Tobacco Use  . Smoking status: Never Smoker  . Smokeless tobacco: Never Used  Substance and Sexual Activity  . Alcohol use: No  . Drug use: No  . Sexual activity: Not on file  Lifestyle  . Physical activity:    Days per week: Not on file    Minutes per session: Not on file  . Stress: Not on file  Relationships  . Social connections:    Talks on phone: Not on file    Gets together: Not on file    Attends religious service: Not on file    Active member of club or organization: Not on file    Attends meetings of clubs or organizations: Not on file    Relationship status: Not on file  . Intimate partner violence:    Fear of current or ex partner: Not on file    Emotionally abused: Not on file    Physically abused: Not on file    Forced sexual activity: Not on file  Other Topics Concern  . 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 Went to high school. He has worked at his present job for 22 years. He has been married for 34 years. They have 3 children. 2 of his daughters live in [place]. He drinks less than two  cups of caffeine per day. He does not use  tobacco or recreational drugs. He has rare alcohol intake. Lives with wife and daughter      OSA diagnosed at [facility] , had his  sleep studies there  reviewed them all in detail today he  has retrognathia, sinusitis,  rhinitis      His ESS remains very high  at 16 and FSS at 34 . His falls assessment tool score is 5.   nasonex, refitted mask, the recent  HST failed to document that  apnea is present at all. education about REM BD and EDS, narcolepsy , cataplexy.  45 minutes.       PHYSICAL EXAM  Vitals:   02/20/18 0712  BP: 115/65  Pulse: (!) 54  Weight: 199 lb (90.3 kg)  Height: 5\' 6"  (1.676 m)   Body mass index is 32.12 kg/m.  Generalized: Well developed, in no acute distress   Neurological examination  Mentation: Alert oriented to time, place, history taking. Follows all commands speech and language fluent Cranial nerve II-XII: Pupils were equal round reactive to light. Extraocular movements were full, visual field were full on confrontational test. Facial sensation and strength were normal. Uvula tongue midline. Head turning and shoulder shrug  were normal and symmetric.  Neck circumference 16-1/2 inches, Mallampati 3+ Motor: The motor testing reveals 5 over 5 strength of all 4 extremities. Good symmetric motor tone is noted throughout.  Sensory: Sensory testing is intact to soft touch on all 4 extremities. No evidence of extinction is noted.  Coordination: Cerebellar testing reveals good finger-nose-finger and heel-to-shin bilaterally.  Gait and station: Gait is normal. Reflexes: Deep tendon reflexes are symmetric and  normal bilaterally.   DIAGNOSTIC DATA (LABS, IMAGING, TESTING) - I reviewed patient records, labs, notes, testing and imaging myself where available.  Lab Results  Component Value Date   WBC 12.6 (H) 04/01/2017   HGB 13.6 04/01/2017   HCT 40.5 04/01/2017   MCV 95.0 04/01/2017   PLT 228 04/01/2017        Component Value Date/Time   NA 138 04/01/2017 1146   NA 142 06/14/2016 1156   NA 141 03/18/2013 1739   K 4.8 04/01/2017 1146   K 4.0 03/18/2013 1739   CL 102 04/01/2017 1146   CL 107 03/18/2013 1739   CO2 28 04/01/2017 1146   CO2 30 03/18/2013 1739   GLUCOSE 123 (H) 04/01/2017 1146   GLUCOSE 79 03/18/2013 1739   BUN 28 (H) 09/25/2017 1643   BUN 18 06/14/2016 1156   BUN 23 (H) 03/18/2013 1739   CREATININE 1.42 (H) 09/25/2017 1643   CREATININE 1.50 (H) 12/18/2013 1008   CALCIUM 9.4 04/01/2017 1146   CALCIUM 9.2 03/18/2013 1739   PROT 6.9 12/31/2017 0855   ALBUMIN 4.6 12/31/2017 0855   AST 20 12/31/2017 0855   ALT 15 12/31/2017 0855   ALKPHOS 43 12/31/2017 0855   BILITOT 0.7 12/31/2017 0855   GFRNONAA 51 (L) 09/25/2017 1643   GFRNONAA 50 (L) 12/18/2013 1008   GFRAA 59 (L) 09/25/2017 1643   GFRAA 58 (L) 12/18/2013 1008   Lab Results  Component Value Date   CHOL 112 12/31/2017   HDL 50 12/31/2017   LDLCALC 40 12/31/2017   TRIG 112 12/31/2017   CHOLHDL 2.2 12/31/2017   Lab Results  Component Value Date   HGBA1C  05/11/2008    5.5 (NOTE)   The ADA recommends the following therapeutic goal for glycemic   control related to Hgb A1C measurement:   Goal of Therapy:   < 7.0% Hgb A1C   Reference: American Diabetes Association: Clinical Practice   Recommendations 2008, Diabetes Care,  2008, 31:(Suppl 1).   No results found for: VITAMINB12 No results found for: TSH    ASSESSMENT AND PLAN 62 y.o. 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 here  with:  1.  Obstructive sleep apnea on CPAP 2.  Memory disturbance  The patient CPAP download shows excellent compliance and good treatment of his apnea.  Encouraged to continue using the CPAP nightly and greater than 4 hours each night.  We adjusted his humidity today in office.  He is advised that if his symptoms worsen or he develop new symptoms he should let us know.  He will follow-up in 6 months or sooner if needed.   I spent 15 minutes with the patient. 50% of this time was spent reviewing CPAP download   Ward Givens, MSN, NP-C 02/20/2018, 7:36 AM [facility] [address], [place], [place] [address] [phone]

## 2018-02-20 NOTE — Patient Instructions (Signed)
Your Plan:  Continue using CPAP nightly and >4 hours If your symptoms worsen or you develop new symptoms please let us know.   Thank you for coming to see [place] at [facility]. I hope we have been able to provide you high quality care today.  You may receive a patient satisfaction survey over the next few weeks. We would appreciate your feedback and comments so that we may continue to improve ourselves and the health of our patients.

## 2018-02-26 ENCOUNTER — Other Ambulatory Visit: Payer: Self-pay | Admitting: Cardiovascular Disease

## 2018-03-03 DIAGNOSIS — G4733 Obstructive sleep apnea (adult) (pediatric): Secondary | ICD-10-CM | POA: Diagnosis not present

## 2018-03-17 DIAGNOSIS — G4733 Obstructive sleep apnea (adult) (pediatric): Secondary | ICD-10-CM | POA: Diagnosis not present

## 2018-03-20 DIAGNOSIS — H2513 Age-related nuclear cataract, bilateral: Secondary | ICD-10-CM | POA: Diagnosis not present

## 2018-03-27 ENCOUNTER — Other Ambulatory Visit: Payer: Self-pay | Admitting: Gastroenterology

## 2018-04-03 DIAGNOSIS — G4733 Obstructive sleep apnea (adult) (pediatric): Secondary | ICD-10-CM | POA: Diagnosis not present

## 2018-04-18 ENCOUNTER — Other Ambulatory Visit: Payer: Self-pay | Admitting: Family Medicine

## 2018-04-22 ENCOUNTER — Other Ambulatory Visit: Payer: Self-pay

## 2018-04-22 MED ORDER — NITROGLYCERIN 0.4 MG SL SUBL: 0.4000 mg | SUBLINGUAL_TABLET | SUBLINGUAL | 1 refills | Status: DC | PRN

## 2018-04-22 NOTE — Telephone Encounter (Signed)
*  STAT* If patient is at the pharmacy, call can be transferred to refill team.   1. Which medications need to be refilled? (please list name of each medication and dose if known) Nitorglycerin  2. Which pharmacy/location (including street and city if local pharmacy) is medication to be sent to? [facility]  3. Do they need a 30 day or 90 day supply?

## 2018-04-25 DIAGNOSIS — G4733 Obstructive sleep apnea (adult) (pediatric): Secondary | ICD-10-CM | POA: Diagnosis not present

## 2018-05-10 ENCOUNTER — Encounter: Payer: Self-pay | Admitting: Physician Assistant

## 2018-05-10 ENCOUNTER — Ambulatory Visit (INDEPENDENT_AMBULATORY_CARE_PROVIDER_SITE_OTHER): Payer: 59 | Admitting: Physician Assistant

## 2018-05-10 VITALS — BP 135/85 | HR 80 | Temp 98.5°F | Resp 14

## 2018-05-10 DIAGNOSIS — J029 Acute pharyngitis, unspecified: Secondary | ICD-10-CM

## 2018-05-10 DIAGNOSIS — M791 Myalgia, unspecified site: Secondary | ICD-10-CM | POA: Diagnosis not present

## 2018-05-10 DIAGNOSIS — J101 Influenza due to other identified influenza virus with other respiratory manifestations: Secondary | ICD-10-CM | POA: Diagnosis not present

## 2018-05-10 LAB — POCT INFLUENZA A/B
Influenza A, POC: NEGATIVE
Influenza B, POC: NEGATIVE

## 2018-05-10 LAB — POCT RAPID STREP A (OFFICE): Rapid Strep A Screen: NEGATIVE

## 2018-05-10 MED ORDER — OSELTAMIVIR PHOSPHATE 75 MG PO CAPS: 75.0000 mg | ORAL_CAPSULE | Freq: Two times a day (BID) | ORAL | 0 refills | Status: AC

## 2018-05-10 MED ORDER — PREDNISONE 10 MG PO TABS: 10.0000 mg | ORAL_TABLET | Freq: Every day | ORAL | 0 refills | Status: AC

## 2018-05-10 NOTE — Progress Notes (Signed)
Patient: Kevin [name] Male    DOB: 05-Dec-1955   63 y.o.   MRN: [id number] Visit Date: 05/15/2018  Today's Provider: Trinna Post, PA-C   Chief Complaint  Patient presents with  . Cough   Subjective:     Cough  This is a new problem. The current episode started in the past 7 days (4 days). The problem has been gradually worsening. The cough is non-productive. Associated symptoms include chills, ear pain, headaches, myalgias, a sore throat and wheezing. Pertinent negatives include no chest pain, ear congestion, fever, heartburn, hemoptysis, nasal congestion, postnasal drip, rash, rhinorrhea, shortness of breath, sweats or weight loss. Nothing aggravates the symptoms. Treatments tried: Dayquil, Nightquil, Coracidin  The treatment provided mild relief.   Patient has had cough and sore throat for 4 days. Patient also has symptoms of ear pain, low grade fever, chills, muscle aches, headaches, chest tightness and wheezing. Patient has been taking otc Dayquil, Nightquil, Coracidin with mild relief.  Allergies  Allergen Reactions  . Strawberry Extract Anaphylaxis       . Dilaudid [Hydromorphone Hcl] Nausea And Vomiting     Current Outpatient Medications:  .  ALPRAZolam (XANAX) 0.5 MG tablet, TAKE 1 TO 2 TABLETS BY MOUTH NIGHTLY AT BEDTIME, Disp: 60 tablet, Rfl: 5 .  aspirin EC 81 MG tablet, Take 81 mg by mouth daily., Disp: , Rfl:  .  cetirizine (ZYRTEC) 10 MG tablet, Take 10 mg by mouth at bedtime. , Disp: , Rfl:  .  clobetasol ointment (TEMOVATE) 5.62 %, Apply 1 application topically 2 (two) times daily as needed (for psoriasis)., Disp: , Rfl:  .  clopidogrel (PLAVIX) 75 MG tablet, TAKE 1 TABLET BY MOUTH DAILY, Disp: 90 tablet, Rfl: 3 .  desonide (DESOWEN) 0.05 % cream, Apply 1 application topically 2 (two) times daily as needed (for psoriasis)., Disp: , Rfl:  .  ezetimibe (ZETIA) 10 MG tablet, TAKE 1 TABLET BY MOUTH DAILY, Disp: 90 tablet, Rfl: 1 .  isosorbide mononitrate  (IMDUR) 30 MG 24 hr tablet, TAKE 1 TABLET BY MOUTH ONCE DAILY, Disp: 90 tablet, Rfl: 3 .  LEXAPRO 20 MG tablet, TAKE 1 TABLET (20 MG TOTAL) BY MOUTH DAILY., Disp: 90 tablet, Rfl: 3 .  metoprolol tartrate (LOPRESSOR) 25 MG tablet, TAKE 1 TABLET BY MOUTH TWICE DAILY, Disp: 180 tablet, Rfl: 3 .  nitroGLYCERIN (NITROSTAT) 0.4 MG SL tablet, Place 1 tablet (0.4 mg total) under the tongue every 5 (five) minutes as needed. May repeat for up to 3 doses., Disp: 25 tablet, Rfl: 1 .  pantoprazole (PROTONIX) 20 MG tablet, TAKE 1 TABLET BY MOUTH TWICE DAILY BEFORE A MEAL., Disp: 60 tablet, Rfl: 1 .  RANEXA 1000 MG SR tablet, Take 1 tablet (1,000 mg total) by mouth 2 (two) times daily., Disp: 180 tablet, Rfl: 3 .  rosuvastatin (CRESTOR) 40 MG tablet, TAKE 1 TABLET BY MOUTH DAILY, Disp: 90 tablet, Rfl: 1 .  oseltamivir (TAMIFLU) 75 MG capsule, Take 1 capsule (75 mg total) by mouth 2 (two) times daily for 5 days., Disp: 10 capsule, Rfl: 0  Review of Systems  Constitutional: Positive for chills. Negative for appetite change, fever and weight loss.  HENT: Positive for ear pain and sore throat. Negative for postnasal drip and rhinorrhea.   Respiratory: Positive for cough, chest tightness and wheezing. Negative for hemoptysis and shortness of breath.   Cardiovascular: Negative for chest pain and palpitations.  Gastrointestinal: Negative for abdominal pain, heartburn, nausea and vomiting.  Musculoskeletal: Positive for myalgias.  Skin: Negative for rash.  Neurological: Positive for headaches.    Social History   Tobacco Use  . Smoking status: Never Smoker  . Smokeless tobacco: Never Used  Substance Use Topics  . Alcohol use: No      Objective:   BP 135/85   Pulse 80   Temp 98.5 F (36.9 C)   Resp 14   SpO2 97%  Vitals:   05/15/18 1409  BP: 135/85  Pulse: 80  Resp: 14  Temp: 98.5 F (36.9 C)  SpO2: 97%     Physical Exam Constitutional:      General: He is not in acute distress.     Appearance: He is well-developed. He is ill-appearing. He is not diaphoretic.  HENT:     Right Ear: Tympanic membrane and external ear normal.     Left Ear: Tympanic membrane and external ear normal.     Nose: Rhinorrhea present.     Right Sinus: No maxillary sinus tenderness or frontal sinus tenderness.     Left Sinus: No maxillary sinus tenderness or frontal sinus tenderness.     Mouth/Throat:     Pharynx: Uvula midline. No oropharyngeal exudate.  Eyes:     General:        Right eye: Discharge present.        Left eye: Discharge present.    Conjunctiva/sclera: Conjunctivae normal.     Comments: Watery Discharge   Neck:     Musculoskeletal: Normal range of motion and neck supple.  Cardiovascular:     Rate and Rhythm: Normal rate and regular rhythm.  Pulmonary:     Effort: Pulmonary effort is normal. No respiratory distress.     Breath sounds: Normal breath sounds. No wheezing or rales.  Lymphadenopathy:     Cervical: No cervical adenopathy.  Skin:    General: Skin is warm and dry.  Neurological:     Mental Status: He is alert and oriented to person, place, and time. Mental status is at baseline.  Psychiatric:        Mood and Affect: Mood normal.        Behavior: Behavior normal.         Assessment & Plan    1. Sore throat  Rapid flu negative but does appear clinically to have the flu. Will treat with taimflu. Prednisone for severe sore throat. Rapid strep negative.   - predniSONE (DELTASONE) 10 MG tablet; Take 1 tablet (10 mg total) by mouth daily with breakfast for 3 days.  Dispense: 3 tablet; Refill: 0 - POCT Influenza A/B - POCT rapid strep A  2. Myalgia  - POCT Influenza A/B - POCT rapid strep A  3. Influenza A  - oseltamivir (TAMIFLU) 75 MG capsule; Take 1 capsule (75 mg total) by mouth 2 (two) times daily for 5 days.  Dispense: 10 capsule; Refill: 0  Return if symptoms worsen or fail to improve.  The entirety of the information documented in the History  of Present Illness, Review of Systems and Physical Exam were personally obtained by me. Portions of this information were initially documented by April M. Sabra Heck, CMA and reviewed by me for thoroughness and accuracy.      Trinna Post, PA-C  [facility]

## 2018-05-10 NOTE — Patient Instructions (Addendum)

## 2018-05-26 ENCOUNTER — Other Ambulatory Visit: Payer: Self-pay | Admitting: Gastroenterology

## 2018-05-27 ENCOUNTER — Ambulatory Visit (INDEPENDENT_AMBULATORY_CARE_PROVIDER_SITE_OTHER): Payer: 59 | Admitting: Family Medicine

## 2018-05-27 ENCOUNTER — Encounter: Payer: Self-pay | Admitting: Family Medicine

## 2018-05-27 VITALS — BP 138/76 | HR 62 | Temp 98.1°F | Wt 200.0 lb

## 2018-05-27 DIAGNOSIS — J0100 Acute maxillary sinusitis, unspecified: Secondary | ICD-10-CM | POA: Diagnosis not present

## 2018-05-27 MED ORDER — AMOXICILLIN 875 MG PO TABS: 875.0000 mg | ORAL_TABLET | Freq: Two times a day (BID) | ORAL | 0 refills | Status: DC

## 2018-05-27 NOTE — Progress Notes (Signed)
Patient: Kevin [name] Male    DOB: [birth year]   63 y.o.   MRN: [id number] Visit Date: 05/27/2018  Today's Provider: Vernie Murders, PA   Chief Complaint  Patient presents with  . Cough   Subjective:     HPI   Patient was seen by Fabio Bering on 05/10/2018 for flu and sore throat. Patient was given rx's for prednisone and Tamiflu. Patient states he thought he was getting better but symptoms have lingered. Patient states sore throat and congestion has gotten worse now.    Past Medical History:  Diagnosis Date  . Brachial plexopathy   . CKD (chronic kidney disease), stage II - III ([place])   . Coronary artery disease    a. s/p 4 vessel CABG in 05/2008 w/ LIMA-LAD, SVG-Diag, sequential SVG-OM1/OM2; b. s/p PCI/DES x 2 to LAD in 10/2009; c. LHC 03/2013 stable disease and patent LAD stents; d. 10/2016 Cath: LM min irregs, LAD 20p, 78m ISR, D2 80ost, LCX small, min irregs, OM1/2/3 min irregs, RCA/RPDA/RPAV min irregs, RPL1/2 nl RPL3 min irregs, VG->D2 nl, VG->OM1->OM2 100, EF 55-65%.  . H/O maze procedure 05/17/2008   a. @ time of CABG  . Hyperlipidemia   . 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   . Hypertension   . Memory deficit after cerebral infarction   . OSA (obstructive sleep apnea)   . Overweight(278.02)   . PAF (paroxysmal atrial fibrillation) ([place])    a. s/p Maze 05/2008, previously on Eliquis->discontinued 05/2016; c. CHADS2VASc => 4 (HTN, stroke x 2, vascular disease)  . Patent foramen ovale    a. 05/2008 s/p closure @ time of CABG.  . Psoriatic arthritis ([place])   . Sleep apnea   . Stroke [facility])    a. 07/2005 right brain; b. 05/2005 left brain; c. Resultant memory deficits.  . Thrombocytopenia ([place])    Past Surgical History:  Procedure Laterality Date  . ACUTE PANCREATITIS  5/11  . APPENDECTOMY  1984  . CHOLECYSTECTOMY  08/19/2009  . COLONOSCOPY WITH PROPOFOL N/A 06/07/2017   Procedure: COLONOSCOPY WITH PROPOFOL;  Surgeon: Virgel Manifold, MD;  Location: [facility] ENDOSCOPY;  Service: Endoscopy;  Laterality: N/A;  . CORONARY ARTERY BYPASS GRAFT  05/12/2008   x4 by PeterVan Trigt,MD  . LEFT HEART CATH AND CORONARY ANGIOGRAPHY N/A 11/29/2016   Procedure: LEFT HEART CATH AND CORONARY ANGIOGRAPHY;  Surgeon: Wellington Hampshire, MD;  Location: [place] [facility];  Service: Cardiovascular;  Laterality: N/A;  . PATENT FORAMEN OVALE CLOSURE    . TONSILLECTOMY     AS A CHILD   Family History  Problem Relation Age of Onset  . Diabetes Sister   . Ovarian cancer Sister   . Psychiatric Illness Father   . Heart attack Father   . Diabetes Other   . Hypertension Mother   . Heart disease Mother   . Lung cancer Paternal Aunt   . Coronary artery disease Other   . Diabetes Other   . Hypertension Other   . Hyperlipidemia Other   . Cancer Paternal Uncle   . Heart disease Paternal Grandmother   . Heart disease Paternal Grandfather    Allergies  Allergen Reactions  . Strawberry Extract Anaphylaxis       . Dilaudid [Hydromorphone Hcl] Nausea And Vomiting    Current Outpatient Medications:  .  ALPRAZolam (XANAX) 0.5 MG tablet, TAKE 1 TO 2 TABLETS BY MOUTH NIGHTLY AT BEDTIME,  Disp: 60 tablet, Rfl: 5 .  aspirin EC 81 MG tablet, Take 81 mg by mouth daily., Disp: , Rfl:  .  cetirizine (ZYRTEC) 10 MG tablet, Take 10 mg by mouth at bedtime. , Disp: , Rfl:  .  clobetasol ointment (TEMOVATE) 4.33 %, Apply 1 application topically 2 (two) times daily as needed (for psoriasis)., Disp: , Rfl:  .  clopidogrel (PLAVIX) 75 MG tablet, TAKE 1 TABLET BY MOUTH DAILY, Disp: 90 tablet, Rfl: 3 .  desonide (DESOWEN) 0.05 % cream, Apply 1 application topically 2 (two) times daily as needed (for psoriasis)., Disp: , Rfl:  .  ezetimibe (ZETIA) 10 MG tablet, TAKE 1 TABLET BY MOUTH DAILY, Disp: 90 tablet, Rfl: 1 .  isosorbide mononitrate (IMDUR) 30 MG 24 hr tablet, TAKE 1 TABLET BY MOUTH ONCE DAILY, Disp: 90 tablet, Rfl: 3 .  LEXAPRO 20 MG tablet, TAKE 1 TABLET (20  MG TOTAL) BY MOUTH DAILY., Disp: 90 tablet, Rfl: 3 .  metoprolol tartrate (LOPRESSOR) 25 MG tablet, TAKE 1 TABLET BY MOUTH TWICE DAILY, Disp: 180 tablet, Rfl: 3 .  nitroGLYCERIN (NITROSTAT) 0.4 MG SL tablet, Place 1 tablet (0.4 mg total) under the tongue every 5 (five) minutes as needed. May repeat for up to 3 doses., Disp: 25 tablet, Rfl: 1 .  pantoprazole (PROTONIX) 20 MG tablet, TAKE 1 TABLET BY MOUTH TWICE DAILY BEFORE A MEAL., Disp: 60 tablet, Rfl: 1 .  RANEXA 1000 MG SR tablet, Take 1 tablet (1,000 mg total) by mouth 2 (two) times daily., Disp: 180 tablet, Rfl: 3 .  rosuvastatin (CRESTOR) 40 MG tablet, TAKE 1 TABLET BY MOUTH DAILY, Disp: 90 tablet, Rfl: 1  Review of Systems  Constitutional: Negative for appetite change.  HENT: Positive for congestion and sore throat.   Respiratory: Positive for cough. Negative for chest tightness.   Cardiovascular: Negative for palpitations.  Gastrointestinal: Negative for abdominal pain, nausea and vomiting.   Social History   Tobacco Use  . Smoking status: Never Smoker  . Smokeless tobacco: Never Used  Substance Use Topics  . Alcohol use: No     Objective:   BP 138/76 (BP Location: Right Arm, Patient Position: Sitting, Cuff Size: Large)   Pulse 62   Temp 98.1 F (36.7 C) (Oral)   Wt 200 lb (90.7 kg)   SpO2 98%   BMI 32.28 kg/m  Vitals:   05/27/18 1611  BP: 138/76  Pulse: 62  Temp: 98.1 F (36.7 C)  TempSrc: Oral  SpO2: 98%  Weight: 200 lb (90.7 kg)   Physical Exam Constitutional:      General: He is not in acute distress.    Appearance: He is well-developed. He is not ill-appearing.  HENT:     Head: Normocephalic and atraumatic.     Right Ear: Hearing and tympanic membrane normal.     Left Ear: Hearing and tympanic membrane normal.     Nose: No rhinorrhea.     Comments: No transillumination of maxillary sinuses with minimal discomfort.    Mouth/Throat:     Pharynx: Oropharynx is clear.  Eyes:     General: Lids are  normal. No scleral icterus.       Right eye: No discharge.        Left eye: No discharge.     Conjunctiva/sclera: Conjunctivae normal.  Cardiovascular:     Rate and Rhythm: Normal rate and regular rhythm.     Heart sounds: Normal heart sounds.  Pulmonary:     Effort: Pulmonary effort  is normal. No respiratory distress.     Breath sounds: Normal breath sounds.  Abdominal:     General: Bowel sounds are normal.  Musculoskeletal: Normal range of motion.  Skin:    Findings: No lesion or rash.  Neurological:     Mental Status: He is alert and oriented to person, place, and time.  Psychiatric:        Speech: Speech normal.        Behavior: Behavior normal.        Thought Content: Thought content normal.       Assessment & Plan    1. Subacute maxillary sinusitis Onset with sore throat, cough (non-productive), headache (around eyes) and PND sensation over the past 24-36 hours. No fever, body aches or chills. Had flu-like symptoms 2 weeks ago and this episode is not as bad. May use Mucinex-DM for cough and congestion. Add throat lozenges or saltwater gargles with antibiotic for sinusitis with PND. Recheck prn. - amoxicillin (AMOXIL) 875 MG tablet; Take 1 tablet (875 mg total) by mouth 2 (two) times daily.  Dispense: 20 tablet; Refill: [place]  [facility]

## 2018-06-02 ENCOUNTER — Encounter: Payer: Self-pay | Admitting: Adult Health

## 2018-06-04 ENCOUNTER — Ambulatory Visit (INDEPENDENT_AMBULATORY_CARE_PROVIDER_SITE_OTHER): Payer: 59 | Admitting: Neurology

## 2018-06-04 ENCOUNTER — Other Ambulatory Visit: Payer: Self-pay

## 2018-06-04 ENCOUNTER — Encounter: Payer: Self-pay | Admitting: Neurology

## 2018-06-04 VITALS — BP 110/74 | HR 59 | Resp 18 | Ht 66.0 in | Wt 201.0 lb

## 2018-06-04 DIAGNOSIS — I480 Paroxysmal atrial fibrillation: Secondary | ICD-10-CM | POA: Diagnosis not present

## 2018-06-04 DIAGNOSIS — R413 Other amnesia: Secondary | ICD-10-CM | POA: Diagnosis not present

## 2018-06-04 DIAGNOSIS — I25708 Atherosclerosis of coronary artery bypass graft(s), unspecified, with other forms of angina pectoris: Secondary | ICD-10-CM | POA: Diagnosis not present

## 2018-06-04 DIAGNOSIS — G4733 Obstructive sleep apnea (adult) (pediatric): Secondary | ICD-10-CM | POA: Diagnosis not present

## 2018-06-04 NOTE — Progress Notes (Addendum)
PATIENT: Kevin [name] DOB: [birth year]  REASON FOR VISIT: follow up- cognitive slowing, stroke HISTORY FROM: patient  HISTORY OF PRESENT ILLNESS:  Interval history : 63 year old male Caucasian patient who is now no longer on Eliquis for atrial fibrillation but Plavix as an antiplatelet agent.  He had no recent angina concerns, he is usually a compliant CPAP user but over the early months of February contracted influenza and had to wait for 2 weeks to be able to tolerate positive airway pressure again.  He has resumed the use of the machine now there is an autotitrator between 5 and 15 cmH2O was 2 cm EPR, a ramp time of 10 minutes and his average pressure 90% of the time is 9.5 cm water.   When Kevin [name] is using his machine he uses it for about 8 hours and 30 minutes.  However given the recent respiratory infect he has only had a compliance for 23% for February 2020.  He had used it during travels to [facility] in January 2020, he went for a business convention. He owns a Programme researcher, broadcasting/film/video. 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 His spouse feels that his performance is limited and work routines are too Film/video editor for him.   Kevin [name] is a 63 year old male patient with a history of memory disturbance following multiple embolic strokes in 6295. A fibrillation history- but Eliquis was d/c 05/2016.   He reports he has significant daytime sleepiness and has noticed being more tired throughout the day.  He states when he was diagnosed with sleep apnea he lost weight and after he was retested by HST was told he did not have apnea anymore ( [facility] sleep study at [facility] - OSA, retrognathia, sinusitis, rhinitis). His Epworth SS endorsed in 2017 at 16/24 and FSS at 52/63 points, both very high. HST did not confirm apnea to be any longer present. He  states that since that time he has gained weight and has begun snoring again. He also reports tinnitus in the right ear, present since his stroke.   BMI: 33.1  STUDY RESULTS HST :  Total Recording Time: 8 hours 50 minutes, valid for 7 h 33 min.  Total Apnea/Hypopnea Index (AHI): 14.9 /h; RDI: 15.8 /h. suggested REM AHI was 21.8/h Average Oxygen Saturation:  94 %, Lowest Oxygen Saturation: 88 %  Total Time in Oxygen Saturation below 89 %: 0.2 minutes.  Heart Rate:  between 46 and 70 bpm.  IMPRESSION: Mild Obstructive sleep apnea confirmed at AHI 14.9/h. REM AHI of 21.8/h with moderate loud snoring (see RDI). No associated hypoxemia, normal heart rate variation.   RECOMMENDATION: I would restart CPAP (I can order a new machine for him) and urge him to lose weight again.  This is a milder apnea form but even mild apnea left untreated can cause irregular heartbeats, meaning a return to uncontrolled atrial fibrillation.  I certify that I have reviewed the raw data recording prior to the issuance of this report in accordance with the standards of the American Academy of Sleep Medicine (AASM). Larey Seat, [name]  05-20-2017       Mr. [name] is seen here for a yearly follow up-  Today 11/14/2016, his chief complaint in the past has been cognitive slowing rather than forgetfulness. His wife also feels that he has become more  and more forgetful but he does very well on our standard office neuropsychology tests. He scored 30 out of 30 on a Mini-Mental Status Examination by Lillia Corporal, his [place] cognitive assessment test documented 28 out of 30 points. He cannot remember if he ate or spoke to a customer ,  difficulties with beginning and finishing projects- all beginning with his strokes in 2007.   Kevin [name] is a 63 year old male with a history of stroke and cognitive slowing. He returns today for follow-up. The patient feels that he has remained stable. He continues to notice trouble with his memory although  he feels it is not any worse. 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 He remains on aspirin. His primary care and cardiologist managing his blood pressure and cholesterol. He is not diabetic. He does not smoke. He denies any new neurological symptoms. He returns today for an evaluation.  HISTORY Kevin [name]  Is a 63 year old male with a history of strokes and residual cognitive slowing. He returns today for follow-up. The patient feels that his cognition has remained the same. He does not feel that things have gotten worse. 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 He is followed by a cardiologist who manages his blood pressure and cholesterol. He denies any additional strokelike symptoms. He also states that his fatigue has improved over the years. He states that he has learned how to manage it better. The patient continues to take aspirin as secondary stroke prevention.  Patient states that his short-term memory is most affected. However he states that he can normally recall things if prompted. He denies any new medical issues. He returns today for an evaluation.  HISTORY 09/24/13: Kevin [name] is a 63 y.o. male here as a follow up visit, after last year's referral for transition of care from Dr. Erling Cruz. HST in 2013 negative for OSA, Epworth score is 18, FSS 43 points, hypersomnia persistent. 2007 two strokes , in March affecting the left and in May 2007 affecting right hemisphere. He has typical right brain deficits based on his neuropsychological testing. 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 His spouse feels that his performance is limited and work routines are too Film/video editor for him. He needs to re check his own work, all this takes extra time, he cannot multitask.  Last visit note: Patient was originally  referred for hypersomnia by Dr Love12-20-13, afterhe reported being noncompliant with CPAP. At the time, I ordered a home sleep test which returned as an AHI of less than 5 and basically would not qualify the patient for CPAP use or doesn't indicate the need of CPAP. Mr. Brandenburg has a past medical history however him admitted and made understandable why apnea was considered for him to be a diagnosis he had a right brain stroke in May 2007, but it of coronary artery disease and atrial fibrillation in the past, hypertension, hyperlipidemia, psoriasis, obstructive sleep apnea was diagnosed in 2012 and at the time he was titrated to 7 cm water pressure at this was done at [facility] Again meanwhile his apnea index is less than 5 and therefore not longer in need of CPAP treatment. Would have been present isn't elevated or excessive daytime sleepiness with Epworth score is 17/24  at the time of his home sleep test as ordered by Dr. Erling Cruz, and also today he again and [name] an Epworth sleepiness score of 13 point less than in last December but still slightly elevated.Goes to bed at 10:30 PM, falls asleep very quickly and arises in the morning at 7:30. He may have one or none bathroom break. And intermittent slowing only the patient does not snore himself awake.The patient reports that if he has a chance to take a nap he will and it'll take between 2-1/2 hours. He seems to be at his sleep he is between 11 AM and 2 was 3 PM. The patient also falls regularly asleep in front of the TV at night at about 6:30 PM . He states that he cannot stay awake until the news.I was able today to review all sleep study if the whole sleep test, the previous polysomnography from 11-24-10 and CPAP interpretation from 9-22,012. Dr. Morene Antu is the referring physician for all 3 studies. The patient was no able to lose weight , was 170 pounds in 2012 now 188 lbs.  His past medical history is summarized here in short:   Patient suffered a left brain stroke in March 2007 at age 41, another stroke in the right posterior cerebral artery on 08-21-2005. 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 He underwent a coronary artery disease 4 vessel bypass surgery and patch closure by Dr. Nils Pyle on 2-15 2010. At the time he had complained of intermittent diplopia as a residual from his stroke symptoms, he also had cognitive issues. A neuropsychological battery testing in September 2010 showed that his cognitive abilities were well in the average range and his executive function was intact. But he had deficits for complex visual attention, shifting attention and multitasking multiple stimuli applied simultaneously did result in confusion. His memory findings were typical of of right brain dysfunction . The patient is a Armed forces operational officer ( flooring) and noted that he becomes more fatigued and probably less attentive with the ongoing day at work.  Toward the end of the day he would often develop a tinnitus which he feels distracts him . He feels that: Naps in the morning or early afternoon can help him to sustain productivity through the day, he feels less fatigued less distractible and more able to concentrate.  REVIEW OF SYSTEMS: Out of a complete 14 system review of symptoms, the patient complains only of the following symptoms, and all other reviewed systems are negative.  Subjective cognitive decline.    ALLERGIES: Allergies  Allergen Reactions  . Strawberry Extract Anaphylaxis       . Dilaudid [Hydromorphone Hcl] Nausea And Vomiting    HOME MEDICATIONS: Outpatient Medications Prior to Visit  Medication Sig Dispense Refill  . ALPRAZolam (XANAX) 0.5 MG tablet TAKE 1 TO 2 TABLETS BY MOUTH NIGHTLY AT BEDTIME 60 tablet 5  . amoxicillin (AMOXIL) 875 MG tablet Take 1 tablet (875 mg total) by mouth 2 (two) times daily. 20 tablet 0  .  aspirin EC 81 MG tablet Take 81 mg by mouth daily.    . cetirizine (ZYRTEC) 10 MG tablet Take 10 mg by mouth at bedtime.     . clobetasol ointment (TEMOVATE) 5.40 % Apply 1 application topically 2 (two) times daily as needed (for psoriasis).    . clopidogrel (PLAVIX) 75 MG tablet TAKE 1 TABLET BY MOUTH DAILY 90 tablet 3  . desonide (DESOWEN)  0.05 % cream Apply 1 application topically 2 (two) times daily as needed (for psoriasis).    . ezetimibe (ZETIA) 10 MG tablet TAKE 1 TABLET BY MOUTH DAILY 90 tablet 1  . isosorbide mononitrate (IMDUR) 30 MG 24 hr tablet TAKE 1 TABLET BY MOUTH ONCE DAILY 90 tablet 3  . LEXAPRO 20 MG tablet TAKE 1 TABLET (20 MG TOTAL) BY MOUTH DAILY. 90 tablet 3  . metoprolol tartrate (LOPRESSOR) 25 MG tablet TAKE 1 TABLET BY MOUTH TWICE DAILY 180 tablet 3  . nitroGLYCERIN (NITROSTAT) 0.4 MG SL tablet Place 1 tablet (0.4 mg total) under the tongue every 5 (five) minutes as needed. May repeat for up to 3 doses. 25 tablet 1  . pantoprazole (PROTONIX) 20 MG tablet TAKE 1 TABLET BY MOUTH TWICE DAILY BEFORE A MEAL. 60 tablet 1  . RANEXA 1000 MG SR tablet Take 1 tablet (1,000 mg total) by mouth 2 (two) times daily. 180 tablet 3  . rosuvastatin (CRESTOR) 40 MG tablet TAKE 1 TABLET BY MOUTH DAILY 90 tablet 1   No facility-administered medications prior to visit.     PAST MEDICAL HISTORY: Past Medical History:  Diagnosis Date  . Brachial plexopathy   . CKD (chronic kidney disease), stage II - III ([place])   . Coronary artery disease    a. s/p 4 vessel CABG in 05/2008 w/ LIMA-LAD, SVG-Diag, sequential SVG-OM1/OM2; b. s/p PCI/DES x 2 to LAD in 10/2009; c. LHC 03/2013 stable disease and patent LAD stents; d. 10/2016 Cath: LM min irregs, LAD 20p, 57m ISR, D2 80ost, LCX small, min irregs, OM1/2/3 min irregs, RCA/RPDA/RPAV min irregs, RPL1/2 nl RPL3 min irregs, VG->D2 nl, VG->OM1->OM2 100, EF 55-65%.  . H/O maze procedure 05/17/2008   a. @ time of CABG  . Hyperlipidemia   . 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   . Hypertension   . Memory deficit after cerebral infarction   . OSA (obstructive sleep apnea)   . Overweight(278.02)   . PAF (paroxysmal atrial fibrillation) ([place])    a. s/p Maze 05/2008, previously on Eliquis->discontinued 05/2016; c. CHADS2VASc => 4 (HTN, stroke x 2, vascular disease)  . Patent foramen ovale    a. 05/2008 s/p closure @ time of CABG.  . Psoriatic arthritis ([place])   . Sleep apnea   . Stroke [facility])    a. 07/2005 right brain; b. 05/2005 left brain; c. Resultant memory deficits.  . Thrombocytopenia ([place])     PAST SURGICAL HISTORY: Past Surgical History:  Procedure Laterality Date  . ACUTE PANCREATITIS  5/11  . APPENDECTOMY  1984  . CHOLECYSTECTOMY  08/19/2009  . COLONOSCOPY WITH PROPOFOL N/A 06/07/2017   Procedure: COLONOSCOPY WITH PROPOFOL;  Surgeon: Virgel Manifold, MD;  Location: [facility] ENDOSCOPY;  Service: Endoscopy;  Laterality: N/A;  . CORONARY ARTERY BYPASS GRAFT  05/12/2008   x4 by PeterVan Trigt,MD  . LEFT HEART CATH AND CORONARY ANGIOGRAPHY N/A 11/29/2016   Procedure: LEFT HEART CATH AND CORONARY ANGIOGRAPHY;  Surgeon: Wellington Hampshire, MD;  Location: [place];  Service: Cardiovascular;  Laterality: N/A;  . PATENT FORAMEN OVALE CLOSURE    . TONSILLECTOMY     AS A CHILD    FAMILY HISTORY: Family History  Problem Relation Age of Onset  . Diabetes Sister   . Ovarian cancer Sister   . Psychiatric Illness Father   . Heart attack Father   . Diabetes Other   . Hypertension Mother   .  Heart disease Mother   . Lung cancer Paternal Aunt   . Coronary artery disease Other   . Diabetes Other   . Hypertension Other   . Hyperlipidemia Other   . Cancer Paternal Uncle   . Heart disease Paternal Grandmother   . Heart disease Paternal Grandfather     SOCIAL HISTORY: Social History   Socioeconomic History  . Marital status: Married    Spouse name: [name]  . Number of children: 3  .  Years of education: Not on file  . Highest education level: Not on file  Occupational History  . Occupation: Full time    Employer: [employer]  Social Needs  . Financial resource strain: Not on file  . Food insecurity:    Worry: Not on file    Inability: Not on file  . Transportation needs:    Medical: Not on file    Non-medical: Not on file  Tobacco Use  . Smoking status: Never Smoker  . Smokeless tobacco: Never Used  Substance and Sexual Activity  . Alcohol use: No  . Drug use: No  . Sexual activity: Not on file  Lifestyle  . Physical activity:    Days per week: Not on file    Minutes per session: Not on file  . Stress: Not on file  Relationships  . Social connections:    Talks on phone: Not on file    Gets together: Not on file    Attends religious service: Not on file    Active member of club or organization: Not on file    Attends meetings of clubs or organizations: Not on file    Relationship status: Not on file  . Intimate partner violence:    Fear of current or ex partner: Not on file    Emotionally abused: Not on file    Physically abused: Not on file    Forced sexual activity: Not on file  Other Topics Concern  . 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 Went to high school. He has worked at his present job for 22 years. He has been married for 34 years. They have 3 children. 2 of his daughters live in [place]. He drinks less than two cups of caffeine per day. He does not use  tobacco or recreational drugs. He has rare alcohol intake. Lives with wife and daughter      OSA diagnosed at [facility] , had his  sleep studies there  reviewed them all in detail today he  has retrognathia, sinusitis,  rhinitis      His ESS remains very high  at 16 and FSS at 45 . His falls assessment tool score is 5.   nasonex, refitted mask, the recent   HST failed to document that  apnea is present at all. education about REM BD and EDS, narcolepsy , cataplexy.  45 minutes.     PHYSICAL EXAM  Vitals:   06/04/18 1437  BP: 110/74  Pulse: (!) 59  Resp: 18  Weight: 201 lb (91.2 kg)  Height: 5\' 6"  (1.676 m)   Body mass index is 32.44 kg/m.  MMSE - Mini Mental State Exam 11/14/2016 11/03/2015  Orientation to time 5 4  Orientation to Place 5 5  Registration 3 3  Attention/ Calculation 5 5  Recall 3 2  Language- name 2 objects 2 2  Language- repeat  1 1  Language- follow 3 step command 3 3  Language- read & follow direction 1 1  Write a sentence 1 1  Copy design 1 1  Total score 30 28     Generalized: Well developed, in no acute distress  Neck circumference : 16 "   Neurological examination   Mentation: Alert oriented to time, place, history taking. Follows all commands speech and language fluent. Mild dysphonia.  He reports memory loss progression . Montreal Cognitive Assessment  11/21/2017 11/14/2016  Visuospatial/ Executive (0/5) 5 5  Naming (0/3) 3 3  Attention: Read list of digits (0/2) 2 2  Attention: Read list of letters (0/1) 0 1  Attention: Serial 7 subtraction starting at 100 (0/3) 2 3  Language: Repeat phrase (0/2) 2 1  Language : Fluency (0/1) 1 1  Abstraction (0/2) 2 2  Delayed Recall (0/5) 3 4  Orientation (0/6) 6 6  Total 26 28    Cranial nerve: no loss of smell or taste. Pupils were equal round reactive to light. Uvula/  tongue midline. Tinnitus- " in my right head "- " its not in my ear, it's in the head. "  Hearing the tuning fork vibration decreased on the right, versus left.  Head turning and shoulder shrug were normal and symmetric. Motor: right sided tone elevated, mildly, no pronation, no drop.   Sensory: Sensory testing is intact to soft touch on all 4 extremities. Coordination: finger-nose maneuver performed  bilaterally, with target tremor.  Action tremor.  Reflexes: Deep tendon reflexes are  symmetric, brisk patella reflex- right over left.  DIAGNOSTIC DATA (LABS, IMAGING, TESTING) - I reviewed patient records, labs, notes, testing and imaging myself where available.  CPAP download reviewed.    ASSESSMENT AND PLAN 63 y.o. 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 here as a stroke patient of Dr Bernardo Heater , later  Dr. Clydene Fake with:  1. Strokes in 2007(followed by Dr Erling Cruz until 2013) with residual right brain syndrome.  A) Cognitive slowing-  Related to strokes in onset . He feels there is  progression.  B)  Sleep - he is active , but not enactment of dreams.  Right arm myoclonus, twitches, jerking.   while apnea free in 2016 he now has tested positive again -2019  on CPAP.   Continue on Plavix  for stroke prevention.   The patient's memory score was not obtained today.  I will ask my RN to perform [facility] testing .  We will continue to monitor yearly- MOCA.   Larey Seat, MD    06/04/2018, 3:03 PM [facility] [address] [place], [place] [address] [phone]

## 2018-06-25 ENCOUNTER — Other Ambulatory Visit: Payer: Self-pay | Admitting: Family Medicine

## 2018-06-25 DIAGNOSIS — F419 Anxiety disorder, unspecified: Secondary | ICD-10-CM

## 2018-06-25 MED ORDER — ALPRAZOLAM 0.5 MG PO TABS: ORAL_TABLET | ORAL | 0 refills | Status: DC

## 2018-06-25 MED FILL — ALPRAZolam 0.5 MG TABS: 0.5 | 30 days supply | Qty: 60 | Fill #0

## 2018-06-25 NOTE — Telephone Encounter (Signed)
Patient scheduled with Simona Huh Chrismon

## 2018-06-25 NOTE — Telephone Encounter (Signed)
Filled one month. He should make a follow up visit in one month to discuss continuing these medications and other chronic medications.

## 2018-06-25 NOTE — Telephone Encounter (Signed)
[facility] faxed refill request for the following medications:  ALPRAZolam (XANAX) 0.5 MG tablet   Please advise.

## 2018-06-26 MED FILL — PANTOPRAZOLE SOD DR 20 MG T: 20 | 30 days supply | Qty: 60 | Fill #0

## 2018-07-04 MED FILL — RANEXA ER 1,000 MG TABLET: 1000 | 30 days supply | Qty: 60 | Fill #0

## 2018-07-14 ENCOUNTER — Other Ambulatory Visit: Payer: Self-pay | Admitting: Gastroenterology

## 2018-07-14 MED FILL — LEXAPRO 20 MG TABLET: 20 | 60 days supply | Qty: 60 | Fill #0

## 2018-07-21 MED FILL — PANTOPRAZOLE SOD DR 20 MG T: 20 | 30 days supply | Qty: 60 | Fill #0

## 2018-07-28 ENCOUNTER — Encounter: Payer: Self-pay | Admitting: Family Medicine

## 2018-07-28 ENCOUNTER — Ambulatory Visit (INDEPENDENT_AMBULATORY_CARE_PROVIDER_SITE_OTHER): Payer: 59 | Admitting: Family Medicine

## 2018-07-28 ENCOUNTER — Other Ambulatory Visit: Payer: Self-pay

## 2018-07-28 VITALS — BP 126/72 | HR 60

## 2018-07-28 DIAGNOSIS — G4733 Obstructive sleep apnea (adult) (pediatric): Secondary | ICD-10-CM

## 2018-07-28 DIAGNOSIS — I69319 Unspecified symptoms and signs involving cognitive functions following cerebral infarction: Secondary | ICD-10-CM

## 2018-07-28 DIAGNOSIS — F419 Anxiety disorder, unspecified: Secondary | ICD-10-CM | POA: Diagnosis not present

## 2018-07-28 DIAGNOSIS — L409 Psoriasis, unspecified: Secondary | ICD-10-CM | POA: Diagnosis not present

## 2018-07-28 DIAGNOSIS — I25708 Atherosclerosis of coronary artery bypass graft(s), unspecified, with other forms of angina pectoris: Secondary | ICD-10-CM | POA: Diagnosis not present

## 2018-07-28 DIAGNOSIS — I480 Paroxysmal atrial fibrillation: Secondary | ICD-10-CM | POA: Diagnosis not present

## 2018-07-28 MED ORDER — ALPRAZOLAM 0.5 MG PO TABS: ORAL_TABLET | ORAL | 0 refills | Status: DC

## 2018-07-28 MED FILL — ALPRAZolam 0.5 MG TABS: 0.5 | 30 days supply | Qty: 60 | Fill #0

## 2018-07-28 NOTE — Progress Notes (Signed)
Virtual Visit via Video Note  I connected with Kevin [name] on 07/28/18 at  3:00 PM EDT by a video enabled telemedicine application and verified that I am speaking with the correct person using two identifiers.   I discussed the limitations of evaluation and management by telemedicine and the availability of in person appointments. The patient expressed understanding and agreed to proceed.  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 OSA, psoriasis and anxiety). States his psoriasis is stable but can flare with allergic rhinitis. Sleeping well (8-9 hours) with use of CPAP with autotitrator as followed by Dr. Brett Fairy (neurologist). Anxiety and sleep pattern better with Xanax 0.5 mg hs.  Feeling very well without any further "flu symptoms" he had in February after he took a business trip to [facility]. Denies cough, shortness of breath or fever. He an his wife continue to stay home and follow COVID-19 precautions.  Observations/Objective: Past Medical History:  Diagnosis Date  . Brachial plexopathy   . CKD (chronic kidney disease), stage II - III ([place])   . Coronary artery disease    a. s/p 4 vessel CABG in 05/2008 w/ LIMA-LAD, SVG-Diag, sequential SVG-OM1/OM2; b. s/p PCI/DES x 2 to LAD in 10/2009; c. LHC 03/2013 stable disease and patent LAD stents; d. 10/2016 Cath: LM min irregs, LAD 20p, 23m ISR, D2 80ost, LCX small, min irregs, OM1/2/3 min irregs, RCA/RPDA/RPAV min irregs, RPL1/2 nl RPL3 min irregs, VG->D2 nl, VG->OM1->OM2 100, EF 55-65%.  . H/O maze procedure 05/17/2008   a. @ time of CABG  . Hyperlipidemia   . 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   . Hypertension   . Memory deficit after cerebral infarction   . OSA (obstructive sleep apnea)   . Overweight(278.02)   . PAF (paroxysmal atrial  fibrillation) ([place])    a. s/p Maze 05/2008, previously on Eliquis->discontinued 05/2016; c. CHADS2VASc => 4 (HTN, stroke x 2, vascular disease)  . Patent foramen ovale    a. 05/2008 s/p closure @ time of CABG.  . Psoriatic arthritis ([place])   . Sleep apnea   . Stroke [facility])    a. 07/2005 right brain; b. 05/2005 left brain; c. Resultant memory deficits.  . Thrombocytopenia ([place])    Past Surgical History:  Procedure Laterality Date  . ACUTE PANCREATITIS  5/11  . APPENDECTOMY  1984  . CHOLECYSTECTOMY  08/19/2009  . COLONOSCOPY WITH PROPOFOL N/A 06/07/2017   Procedure: COLONOSCOPY WITH PROPOFOL;  Surgeon: Virgel Manifold, MD;  Location: [facility] ENDOSCOPY;  Service: Endoscopy;  Laterality: N/A;  . CORONARY ARTERY BYPASS GRAFT  05/12/2008   x4 by PeterVan Trigt,MD  . LEFT HEART CATH AND CORONARY ANGIOGRAPHY N/A 11/29/2016   Procedure: LEFT HEART CATH AND CORONARY ANGIOGRAPHY;  Surgeon: Wellington Hampshire, MD;  Location: [place] CV LAB;  Service: Cardiovascular;  Laterality: N/A;  . PATENT FORAMEN OVALE CLOSURE    . TONSILLECTOMY     AS A CHILD   Family History  Problem Relation Age of Onset  . Diabetes Sister   . Ovarian cancer Sister   . Psychiatric Illness Father   . Heart attack Father   . Diabetes Other   . Hypertension Mother   . Heart disease Mother   . Lung cancer Paternal Aunt   . Coronary artery disease Other   .  Diabetes Other   . Hypertension Other   . Hyperlipidemia Other   . Cancer Paternal Uncle   . Heart disease Paternal Grandmother   . Heart disease Paternal Grandfather    Allergies  Allergen Reactions  . Strawberry Extract Anaphylaxis       . Dilaudid [Hydromorphone Hcl] Nausea And Vomiting   Current Outpatient Medications on File Prior to Visit  Medication Sig Dispense Refill  . ALPRAZolam (XANAX) 0.5 MG tablet TAKE 1 TO 2 TABLETS BY MOUTH NIGHTLY AT BEDTIME 60 tablet 0  . amoxicillin (AMOXIL) 875 MG tablet Take 1 tablet (875 mg total) by mouth 2 (two)  times daily. 20 tablet 0  . aspirin EC 81 MG tablet Take 81 mg by mouth daily.    . cetirizine (ZYRTEC) 10 MG tablet Take 10 mg by mouth at bedtime.     . clobetasol ointment (TEMOVATE) 6.06 % Apply 1 application topically 2 (two) times daily as needed (for psoriasis).    . clopidogrel (PLAVIX) 75 MG tablet TAKE 1 TABLET BY MOUTH DAILY 90 tablet 3  . desonide (DESOWEN) 0.05 % cream Apply 1 application topically 2 (two) times daily as needed (for psoriasis).    . ezetimibe (ZETIA) 10 MG tablet TAKE 1 TABLET BY MOUTH DAILY 90 tablet 1  . isosorbide mononitrate (IMDUR) 30 MG 24 hr tablet TAKE 1 TABLET BY MOUTH ONCE DAILY 90 tablet 3  . LEXAPRO 20 MG tablet TAKE 1 TABLET (20 MG TOTAL) BY MOUTH DAILY. 90 tablet 3  . metoprolol tartrate (LOPRESSOR) 25 MG tablet TAKE 1 TABLET BY MOUTH TWICE DAILY 180 tablet 3  . nitroGLYCERIN (NITROSTAT) 0.4 MG SL tablet Place 1 tablet (0.4 mg total) under the tongue every 5 (five) minutes as needed. May repeat for up to 3 doses. 25 tablet 1  . pantoprazole (PROTONIX) 20 MG tablet TAKE 1 TABLET BY MOUTH TWICE DAILY BEFORE A MEAL. 60 tablet 0  . RANEXA 1000 MG SR tablet Take 1 tablet (1,000 mg total) by mouth 2 (two) times daily. 180 tablet 3  . rosuvastatin (CRESTOR) 40 MG tablet TAKE 1 TABLET BY MOUTH DAILY 90 tablet 1   No current facility-administered medications on file prior to visit.    Review of Systems  Constitutional: Negative.   HENT: Negative.   Respiratory: Negative.   Cardiovascular: Negative.   Skin:       Psoriasis stable  Psychiatric/Behavioral:       Anxiety well controlled with use of Xanax at bedtime.   Vitals:   07/28/18 1715  BP: 126/72  Pulse: 60    WDWN male in no apparent distress.  Head: Normocephalic, atraumatic. Neck: Supple, NROM Respiratory: No apparent distress Psych: Normal mood and affect  Assessment and Plan: 1. Anxiety Trying to limit stress by walking outside for exercise and getting adequate sleep with CPAP for  OSA. Xanax with Lexapro at bedtime controls anxiety/stress very well without daytime drowsiness or further cognitive impairment. Will refill Xanax and continue present Lexapro dosage. Recommend follow up for routine labs in the next 2-3 months. - ALPRAZolam (XANAX) 0.5 MG tablet; TAKE 1 TO 2 TABLETS BY MOUTH NIGHTLY AT BEDTIME  Dispense: 60 tablet; Refill: 0  2. CVA, old, cognitive deficits Recognizes some cognitive deficits since CVA's x2 in 2007 with residual mild cognitive deficits. 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 Continue routine follow up with Dr. Brett Fairy (neurologist) as planned.  3. Paroxysmal atrial fibrillation (  HCC) No palpitations recently. Continue to use Metoprolol Tartrate 25 mg BID with good control. Followed by cardiologist regularly.  4. Atherosclerosis of coronary artery bypass graft of native heart with stable angina pectoris ([place]) Followed by Dr. Rockey Situ (cardiologist). Denies shortness of breath, chest pains, edema or palpitations. Has not had to use the prn NTG. Still on Ranexa, Crestor, Zetia, Imdur, Plavix and Baby ASA daily with Metoprolol Tartrate for A. Fib control. Continue this regimen and follow up with cardiologist as planned.  5. Obstructive sleep apnea Followed by Dr. Brett Fairy (neurologist) and sleeping well with using the CPAP with autotitration.  6. Psoriasis Topical treatment with Temovate and Desowen has kept psoriasis stable and controlled. Recognizes some flares when allergic rhinitis flares.   Follow Up Instructions:    I discussed the assessment and treatment plan with the patient. The patient was provided an opportunity to ask questions and all were answered. The patient agreed with the plan and demonstrated an understanding of the instructions.   The patient was advised to call back or seek an in-person evaluation if the symptoms worsen or if the condition fails to  improve as anticipated.  I provided 20 minutes of non-face-to-face time during this encounter.   Vernie Murders, PA

## 2018-08-01 DIAGNOSIS — G4733 Obstructive sleep apnea (adult) (pediatric): Secondary | ICD-10-CM | POA: Diagnosis not present

## 2018-08-11 ENCOUNTER — Other Ambulatory Visit: Payer: Self-pay | Admitting: Cardiovascular Disease

## 2018-08-11 MED FILL — RANEXA ER 1,000 MG TABLET: 1000 | 30 days supply | Qty: 60 | Fill #1

## 2018-08-11 MED FILL — EZETIMIBE 10 MG TABS: 10 | 90 days supply | Qty: 90 | Fill #0

## 2018-08-11 MED FILL — ISOSORBIDE MN ER 30 MG TAB: 30 | 90 days supply | Qty: 90 | Fill #0

## 2018-08-15 ENCOUNTER — Other Ambulatory Visit: Payer: Self-pay | Admitting: Cardiovascular Disease

## 2018-08-15 MED FILL — ROSUVASTATIN CALCIUM 40 MG: 40 | 90 days supply | Qty: 90 | Fill #0

## 2018-08-15 MED FILL — METOPROLOL TARTRATE 25 MG T: 25 | 90 days supply | Qty: 180 | Fill #0

## 2018-08-26 ENCOUNTER — Other Ambulatory Visit: Payer: Self-pay | Admitting: Gastroenterology

## 2018-08-27 MED FILL — PANTOPRAZOLE SOD DR 20 MG T: 20 | 30 days supply | Qty: 60 | Fill #0

## 2018-09-05 MED FILL — RANEXA ER 1,000 MG TABLET: 1000 | 30 days supply | Qty: 60 | Fill #2

## 2018-09-09 MED FILL — CLOPIDOGREL 75 MG TABLET: 75 | 90 days supply | Qty: 90 | Fill #0

## 2018-09-19 ENCOUNTER — Telehealth: Payer: Self-pay | Admitting: *Deleted

## 2018-09-19 NOTE — Telephone Encounter (Signed)
Patient's wife called to give [place] heads up, that a PA for name brand Lexapro is being faxed to office.

## 2018-09-24 ENCOUNTER — Telehealth: Payer: Self-pay | Admitting: Family Medicine

## 2018-09-24 MED FILL — LEXAPRO 20 MG TABLET: 20 | 10 days supply | Qty: 10 | Fill #1

## 2018-09-24 NOTE — Telephone Encounter (Signed)
[place], [place]ley [place] [phone] (Phone) [phone] (Fax)   Called regarding pt's prior authorization that was sent on Monday,  09-22-18 to have LEXAPRO 20 MG tablet approved and filled.  Please advise,  Thanks, [place]

## 2018-09-24 NOTE — Telephone Encounter (Signed)
Patient was most recently seen by Simona Huh for follow up. Will forward.

## 2018-09-25 MED FILL — LEXAPRO 20 MG TABLET: 20 | 90 days supply | Qty: 90 | Fill #2

## 2018-09-25 NOTE — Telephone Encounter (Signed)
Advised that authorization has been approved

## 2018-09-25 NOTE — Telephone Encounter (Signed)
Still trying to get help with a prior authorization.

## 2018-09-25 NOTE — Telephone Encounter (Signed)
Patients wife Vinnie Level called back stating that patient is completely out of the medication. She needs this PA done ASAP because she had to pay $300 out of pocket for 10 days supply. She cannot afford to continue paying out of pocket. Please advise.

## 2018-10-01 ENCOUNTER — Other Ambulatory Visit: Payer: Self-pay | Admitting: Gastroenterology

## 2018-10-02 MED FILL — PANTOPRAZOLE SOD DR 20 MG T: 20 | 30 days supply | Qty: 60 | Fill #0

## 2018-10-07 MED FILL — RANEXA ER 1,000 MG TABLET: 1000 | 30 days supply | Qty: 60 | Fill #3

## 2018-10-29 ENCOUNTER — Other Ambulatory Visit: Payer: Self-pay | Admitting: Gastroenterology

## 2018-10-29 NOTE — Telephone Encounter (Signed)
Patient's wife called asking if the medication   pantoprazole (PROTONIX) 20 MG tablet   Be for 60 to 90 day refill instead of just 30 day. She has to call each month & this delays his treatment because sometimes he does not get them on time.

## 2018-10-31 DIAGNOSIS — G4733 Obstructive sleep apnea (adult) (pediatric): Secondary | ICD-10-CM | POA: Diagnosis not present

## 2018-11-04 NOTE — Telephone Encounter (Signed)
Patient's wife Kevin [name] called & l/m on v/m stating they have not received a refill as requested last week. Please sentd in to [employer] [facility] [employer].(see earlier message) Kevin [name] would like a call back @ [phone].

## 2018-11-04 NOTE — Telephone Encounter (Signed)
sUZAN PATIENT'S WIFE HAS CALLED IN AGAIN REGARDING MEDICATION.,

## 2018-11-05 MED FILL — PANTOPRAZOLE SOD DR 20 MG T: 20 | 30 days supply | Qty: 60 | Fill #0

## 2018-11-10 ENCOUNTER — Other Ambulatory Visit: Payer: Self-pay | Admitting: Family Medicine

## 2018-11-10 ENCOUNTER — Other Ambulatory Visit: Payer: Self-pay | Admitting: Physician Assistant

## 2018-11-10 DIAGNOSIS — F419 Anxiety disorder, unspecified: Secondary | ICD-10-CM

## 2018-11-10 MED FILL — ALPRAZolam 0.5 MG TABS: 0.5 | 30 days supply | Qty: 60 | Fill #0

## 2018-11-10 MED FILL — METOPROLOL TARTRATE 25 MG T: 25 | 90 days supply | Qty: 180 | Fill #0

## 2018-11-10 MED FILL — EZETIMIBE 10 MG TABS: 10 | 90 days supply | Qty: 90 | Fill #1

## 2018-11-10 MED FILL — ISOSORBIDE MN ER 30 MG TAB: 30 | 90 days supply | Qty: 90 | Fill #1

## 2018-11-10 MED FILL — ROSUVASTATIN CALCIUM 40 MG: 40 | 90 days supply | Qty: 90 | Fill #1

## 2018-11-17 MED FILL — RANEXA ER 1,000 MG TABLET: 1000 | 30 days supply | Qty: 60 | Fill #4

## 2018-12-01 ENCOUNTER — Telehealth: Payer: Self-pay | Admitting: Gastroenterology

## 2018-12-01 ENCOUNTER — Other Ambulatory Visit: Payer: Self-pay | Admitting: Gastroenterology

## 2018-12-01 ENCOUNTER — Telehealth: Payer: Self-pay | Admitting: Neurology

## 2018-12-01 MED FILL — CLOPIDOGREL 75 MG TABLET: 75 | 90 days supply | Qty: 90 | Fill #1

## 2018-12-01 MED FILL — PANTOPRAZOLE SOD DR 20 MG T: 20 | 30 days supply | Qty: 60 | Fill #0

## 2018-12-01 NOTE — Telephone Encounter (Signed)
Patient has not been since June on 2019. Called patient and wife states no one has told them they needed a appointment. Made patient a appointment on 01/15/2019 and stated I would send one more refill to the pharmacy to get him through till his appointment. She verbalized understanding. Wife states for several months she has only been getting 30 days and no one told her she needed a appointment. Explain to patient I was sorry and just started here and I will send it to pharmacy

## 2018-12-01 NOTE — Telephone Encounter (Signed)
Pt wife is asking for a call to know if it is okay that pt misses his 6 month f/u, as there is a schedule conflict at hand

## 2018-12-01 NOTE — Telephone Encounter (Signed)
Called [place] back. She went back to work and unable to keep his f/u for 12/10/18. I offered 12/04/18 with MM,NP at 830am but she declined, she has to work. She would like to be placed on cx list. She can do anytime on 12/18/18 and Tuesday's after 1pm. States they can do video visit on those days if needed. Advised I will send message to [place], RN to watch for any cx on MM,NP schedule

## 2018-12-01 NOTE — Telephone Encounter (Signed)
Noted  

## 2018-12-01 NOTE — Telephone Encounter (Signed)
pantoprazole (PROTONIX) 20 MG tablet   Please call medication into [facility] pharmacy & patient request a 90 day script instead of only 30. Please notify patient.

## 2018-12-02 ENCOUNTER — Ambulatory Visit: Payer: 59 | Admitting: Neurology

## 2018-12-10 ENCOUNTER — Ambulatory Visit: Payer: 59 | Admitting: Adult Health

## 2018-12-10 ENCOUNTER — Other Ambulatory Visit: Payer: Self-pay

## 2018-12-10 MED ORDER — RANEXA 1000 MG PO TB12: 1000.0000 mg | ORAL_TABLET | Freq: Two times a day (BID) | ORAL | 3 refills | Status: DC

## 2018-12-11 MED FILL — RANEXA ER 1,000 MG TABLET: 1000 | 90 days supply | Qty: 180 | Fill #0

## 2018-12-15 ENCOUNTER — Other Ambulatory Visit: Payer: Self-pay | Admitting: Family Medicine

## 2018-12-15 DIAGNOSIS — F419 Anxiety disorder, unspecified: Secondary | ICD-10-CM

## 2018-12-15 MED FILL — ALPRAZolam 0.5 MG TABS: 0.5 | 30 days supply | Qty: 60 | Fill #0

## 2018-12-23 MED FILL — LEXAPRO 20 MG TABLET: 20 | 90 days supply | Qty: 90 | Fill #3

## 2018-12-25 MED FILL — PANTOPRAZOLE SOD DR 20 MG T: 20 | 30 days supply | Qty: 60 | Fill #1

## 2018-12-29 ENCOUNTER — Other Ambulatory Visit: Payer: Self-pay

## 2018-12-29 DIAGNOSIS — Z20822 Contact with and (suspected) exposure to covid-19: Secondary | ICD-10-CM

## 2018-12-30 LAB — SPECIMEN STATUS REPORT

## 2018-12-30 LAB — NOVEL CORONAVIRUS, NAA: SARS-CoV-2, NAA: DETECTED — AB

## 2019-01-14 ENCOUNTER — Other Ambulatory Visit: Payer: Self-pay

## 2019-01-15 ENCOUNTER — Encounter: Payer: Self-pay | Admitting: Gastroenterology

## 2019-01-15 ENCOUNTER — Ambulatory Visit (INDEPENDENT_AMBULATORY_CARE_PROVIDER_SITE_OTHER): Payer: 59 | Admitting: Gastroenterology

## 2019-01-15 DIAGNOSIS — R1013 Epigastric pain: Secondary | ICD-10-CM | POA: Diagnosis not present

## 2019-01-15 MED ORDER — PANTOPRAZOLE SODIUM 20 MG PO TBEC: 20.0000 mg | DELAYED_RELEASE_TABLET | Freq: Every day | ORAL | 0 refills | Status: DC

## 2019-01-15 NOTE — Progress Notes (Signed)
[name], MD [address]  [place], [place] [address]  Main: [phone]  Fax: [phone]   Primary Care Physician: Carmon Ginsberg, [place]  Virtual Visit via Video Note  I connected with patient on 01/15/19 at  3:30 PM EDT by video (using [email or web address]) and verified that I am speaking with the correct person using two identifiers.   I discussed the limitations, risks, security and privacy concerns of performing an evaluation and management service by video and the availability of in person appointments. I also discussed with the patient that there may be a patient responsible charge related to this service. The patient expressed understanding and agreed to proceed.  Location of Patient: Home Location of Provider: Home Persons involved: Patient and provider only (Nursing staff checked in patient via phone but were not physically involved in the video interaction - see their notes)   History of Present Illness: Chief Complaint  Patient presents with  . Abdominal Pain    Patient was postive with COVID and had move abdominal pain during covid.     HPI: Kevin [name] is a 63 y.o. male here for follow-up of abdominal pain.  Taking Protonix 20 mg twice daily and with this his abdominal pain has completely resolved. The patient denies abdominal or flank pain, anorexia, nausea or vomiting, dysphagia, change in bowel habits or black or bloody stools or weight loss.  Has had previous episode of diverticulitis in December 2018.  Colonoscopy March 2019 showed diverticulosis and no colon mass.  Also pancreatitis in 2011 and cholecystectomy at the time  Current Outpatient Medications  Medication Sig Dispense Refill  . ALPRAZolam (XANAX) 0.5 MG tablet TAKE 1 TO 2 TABLETS BY MOUTH EVERY EVENING AT BEDTIME 60 tablet 0  . aspirin EC 81 MG tablet Take 81 mg by mouth daily.    . cetirizine (ZYRTEC) 10 MG tablet Take 10 mg by mouth at bedtime.     . clobetasol ointment  (TEMOVATE) [id number] % Apply 1 application topically 2 (two) times daily as needed (for psoriasis).    . clopidogrel (PLAVIX) 75 MG tablet TAKE 1 TABLET BY MOUTH DAILY 90 tablet 3  . desonide (DESOWEN) 0.05 % cream Apply 1 application topically 2 (two) times daily as needed (for psoriasis).    . ezetimibe (ZETIA) 10 MG tablet TAKE 1 TABLET BY MOUTH DAILY 90 tablet 1  . isosorbide mononitrate (IMDUR) 30 MG 24 hr tablet TAKE 1 TABLET BY MOUTH ONCE DAILY 90 tablet 1  . LEXAPRO 20 MG tablet TAKE 1 TABLET (20 MG TOTAL) BY MOUTH DAILY. 90 tablet 3  . metoprolol tartrate (LOPRESSOR) 25 MG tablet TAKE 1 TABLET BY MOUTH TWICE DAILY 180 tablet 0  . nitroGLYCERIN (NITROSTAT) 0.4 MG SL tablet Place 1 tablet (0.4 mg total) under the tongue every 5 (five) minutes as needed. May repeat for up to 3 doses. 25 tablet 1  . pantoprazole (PROTONIX) 20 MG tablet TAKE 1 TABLET BY MOUTH TWICE DAILY BEFORE A MEAL. 60 tablet 1  . RANEXA 1000 MG SR tablet Take 1 tablet (1,000 mg total) by mouth 2 (two) times daily. 180 tablet 3  . rosuvastatin (CRESTOR) 40 MG tablet TAKE 1 TABLET BY MOUTH DAILY 90 tablet 1  . pantoprazole (PROTONIX) 20 MG tablet Take 1 tablet (20 mg total) by mouth daily. 90 tablet 0   No current facility-administered medications for this visit.     Allergies as of 01/15/2019 - Review Complete 01/15/2019  Allergen Reaction  Noted  . Strawberry extract Anaphylaxis 12/04/2011  . Dilaudid [hydromorphone hcl] Nausea And Vomiting 06/21/2010    Review of Systems:    All systems reviewed and negative except where noted in HPI.   Observations/Objective:  Labs: CMP     Component Value Date/Time   NA 138 04/01/2017 1146   NA 142 06/14/2016 1156   NA 141 03/18/2013 1739   K 4.8 04/01/2017 1146   K 4.0 03/18/2013 1739   CL 102 04/01/2017 1146   CL 107 03/18/2013 1739   CO2 28 04/01/2017 1146   CO2 30 03/18/2013 1739   GLUCOSE 123 (H) 04/01/2017 1146   GLUCOSE 79 03/18/2013 1739   BUN 28 (H) 09/25/2017  1643   BUN 18 06/14/2016 1156   BUN 23 (H) 03/18/2013 1739   CREATININE 1.42 (H) 09/25/2017 1643   CREATININE 1.50 (H) 12/18/2013 1008   CALCIUM 9.4 04/01/2017 1146   CALCIUM 9.2 03/18/2013 1739   PROT 6.9 12/31/2017 0855   ALBUMIN 4.6 12/31/2017 0855   AST 20 12/31/2017 0855   ALT 15 12/31/2017 0855   ALKPHOS 43 12/31/2017 0855   BILITOT 0.7 12/31/2017 0855   GFRNONAA 51 (L) 09/25/2017 1643   GFRNONAA 50 (L) 12/18/2013 1008   GFRAA 59 (L) 09/25/2017 1643   GFRAA 58 (L) 12/18/2013 1008   Lab Results  Component Value Date   WBC 12.6 (H) 04/01/2017   HGB 13.6 04/01/2017   HCT 40.5 04/01/2017   MCV 95.0 04/01/2017   PLT 228 04/01/2017    Imaging Studies: No results found.  Assessment and Plan:   Kevin [name] is a 63 y.o. y/o male here for follow-up of abdominal pain which is completely improved with Protonix twice daily  Assessment and Plan: Due to resolution of pain patient is willing to decrease Protonix dosing We will decrease to once daily and patient was asked to take it for 30 days and then completely stop.  If pain reoccurs he can resume the medication after 1 to 2 weeks as his pain was significant without PPI in the past  However, if pain continues to recur, can discuss upper endoscopy or H. pylori testing at that time  (Risks of PPI use were discussed with patient including bone loss, C. Diff diarrhea, pneumonia, infections, CKD, electrolyte abnormalities. Pt. Verbalizes understanding and chooses to continue the medication.)  Colonoscopy up-to-date   Follow Up Instructions: Follow-up in 6 months   I discussed the assessment and treatment plan with the patient. The patient was provided an opportunity to ask questions and all were answered. The patient agreed with the plan and demonstrated an understanding of the instructions.   The patient was advised to call back or seek an in-person evaluation if the symptoms worsen or if the condition fails to improve as  anticipated.  I provided 15 minutes of face-to-face time via video software during this encounter. Additional time was spent in reviewing patient's chart, placing orders etc.   Virgel Manifold, MD  Speech recognition software was used to dictate this note.

## 2019-01-31 DIAGNOSIS — G4733 Obstructive sleep apnea (adult) (pediatric): Secondary | ICD-10-CM | POA: Diagnosis not present

## 2019-02-09 ENCOUNTER — Other Ambulatory Visit: Payer: Self-pay | Admitting: Cardiovascular Disease

## 2019-02-09 ENCOUNTER — Other Ambulatory Visit: Payer: Self-pay | Admitting: Physician Assistant

## 2019-02-09 ENCOUNTER — Other Ambulatory Visit: Payer: Self-pay | Admitting: Family Medicine

## 2019-02-09 DIAGNOSIS — F419 Anxiety disorder, unspecified: Secondary | ICD-10-CM

## 2019-02-09 MED FILL — PANTOPRAZOLE SOD DR 20 MG T: 20 | 90 days supply | Qty: 90 | Fill #0

## 2019-02-09 MED FILL — ALPRAZolam 0.5 MG TABS: 0.5 | 30 days supply | Qty: 60 | Fill #0

## 2019-02-09 NOTE — Telephone Encounter (Signed)
Please schedule overdue F/U with Dr. Gollan. Thank you! 

## 2019-02-09 NOTE — Telephone Encounter (Signed)
Scheduled

## 2019-02-09 NOTE — Telephone Encounter (Signed)
Please schedule F/U appointment with Dr. Gollan. Thank you! 

## 2019-02-10 MED FILL — ISOSORBIDE MN ER 30 MG TAB: 30 | 90 days supply | Qty: 90 | Fill #0

## 2019-02-10 MED FILL — METOPROLOL TARTRATE 25 MG T: 25 | 90 days supply | Qty: 180 | Fill #0

## 2019-02-10 MED FILL — EZETIMIBE 10 MG TABS: 10 | 90 days supply | Qty: 90 | Fill #0

## 2019-02-10 MED FILL — ROSUVASTATIN CALCIUM 40 MG: 40 | 90 days supply | Qty: 90 | Fill #0

## 2019-02-12 ENCOUNTER — Telehealth: Payer: Self-pay | Admitting: Neurology

## 2019-02-12 NOTE — Telephone Encounter (Signed)
Pt's wife called stating that she has been waiting on a call back to let her know when the pt can do the a VV and she also wants to report that the pt is having problems with his Cpap. Please advise.

## 2019-02-16 NOTE — Telephone Encounter (Signed)
I called wife.  Issues with cpap pressures.  Appt made 03-24-19 VV at 0900 with MM.(her schedule she is not able to be able to get off to take pt or come with pt.    Pt will be alone doing this.  Wife would like mychart results (so she know what needs to be done).  I made download for MM to review.

## 2019-02-16 NOTE — Telephone Encounter (Signed)
mychart message sent after visit to wife to relay information, as pt has memory issues and she does want to keep track if needs f/u appt or needs anything.

## 2019-02-24 NOTE — Progress Notes (Addendum)
Cardiology Office Note  Date:  02/25/2019   ID:  Anfernee, Ola 01/27/1956, MRN [id number]  PCP:  Carmon Ginsberg, [place]   Chief Complaint  Patient presents with  . Other    12 month follow up. Patient c/o chest pressure through to the back. Meds reviewed verbally with patient.     HPI:  Mr. Filtz is a 63 y/o male with h/o  CVA, memory problems CAD,  atrial fib,  HTN,  hyperlipidemia,  OSA and previous CVA,   CABG with Maze and PFO closure in February 2010,   cath in 10/2008  for exertional dyspnea and CP showing 2-V CAD.  LAD 95% mid, LCX small RCA large OK.  SVG -> OM1 and OM2 was occluded. SVG-Diag was widely patent and backfilled the LAD well. LIMA - LAD was atretic. Myoview to assess LAD for ischemia with very mild reversible defect in very distal anterior wall and apex. Decision made to manage him medically, continued chest pain and cath Aug 9th 2011 with PCI of the LAD with 2 stents placed.  Cath 11/29/16 for chest pain, medical management He presents today for follow-up of his coronary artery disease  Had covid in sept 2020 Had a cough, otherwise mild symptoms Wife got covid, she had more symptoms  Although he has made a full recovery, he has had rare episodes of chest pressure front to back, comes and goes  At rest, not with exertion  Has been active, not taking NTG  No regular exercise Continues to have chronic low-grade memory issues  Lab work discussed with him, numbers at goal [place]  EKG personally reviewed by myself on todays visit Shows normal sinus rhythm rate 61 bpm no significant ST-T wave changes  Other past medical history reviewed Prior stress test December 13, 2017 Exercise myocardial perfusion imaging study with no significant ischemia Low risk scan   cardiac catheterization performed by Dr. Fletcher Anon August 2018 for stuttering chest pain Cath reviewed Significant underlying one-vessel coronary artery disease with patent SVG to  second diagonal, patent LAD stent with mild in-stent restenosis and known occluded SVG to OM. Native RCA and left circumflex don't have obstructive disease. Normal LV systolic function and mildly elevated left ventricular end-diastolic pressure.  Stress test June 2016 showed no ischemia, normal ejection fraction.  Cardiac catheterization last week at the end of December 2014 showing no significant change in his disease, patent stents in the LAD.  He did have nonsustained VT during the case. Impressive run of ventricular arrhythmia. He is asymptomatic at the time. This was recorded on telemetry and presented to show the family. On further discussion, it was uncertain if arrhythmia was causing his episodes of chest pain. 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  PSH:    Past Surgical History:  Procedure Laterality Date  . ACUTE PANCREATITIS  5/11  . APPENDECTOMY  1984  . CHOLECYSTECTOMY  08/19/2009  . COLONOSCOPY WITH PROPOFOL N/A 06/07/2017   Procedure: COLONOSCOPY WITH PROPOFOL;  Surgeon: Virgel Manifold, MD;  Location: [facility] ENDOSCOPY;  Service: Endoscopy;  Laterality: N/A;  .  CORONARY ARTERY BYPASS GRAFT  05/12/2008   x4 by PeterVan Trigt,MD  . LEFT HEART CATH AND CORONARY ANGIOGRAPHY N/A 11/29/2016   Procedure: LEFT HEART CATH AND CORONARY ANGIOGRAPHY;  Surgeon: Wellington Hampshire, MD;  Location: [place] [facility];  Service: Cardiovascular;  Laterality: N/A;  . PATENT  FORAMEN OVALE CLOSURE    . TONSILLECTOMY     AS A CHILD    Current Outpatient Medications  Medication Sig Dispense Refill  . ALPRAZolam (XANAX) 0.5 MG tablet TAKE 1 - 2 TABLETS BY MOUTH EVERY NIGHT AT BEDTIME 60 tablet 0  . aspirin EC 81 MG tablet Take 81 mg by mouth daily.    . cetirizine (ZYRTEC) 10 MG tablet Take 10 mg by mouth at bedtime.     . clobetasol ointment (TEMOVATE) AB-123456789 % Apply 1 application topically 2 (two) times daily as needed (for psoriasis).    . clopidogrel (PLAVIX) 75 MG tablet TAKE 1 TABLET BY MOUTH DAILY 90 tablet 3  . desonide (DESOWEN) 0.05 % cream Apply 1 application topically 2 (two) times daily as needed (for psoriasis).    . ezetimibe (ZETIA) 10 MG tablet TAKE 1 TABLET BY MOUTH DAILY 90 tablet 0  . isosorbide mononitrate (IMDUR) 30 MG 24 hr tablet TAKE 1 TABLET BY MOUTH ONCE DAILY 90 tablet 0  . LEXAPRO 20 MG tablet TAKE 1 TABLET (20 MG TOTAL) BY MOUTH DAILY. 90 tablet 3  . metoprolol tartrate (LOPRESSOR) 25 MG tablet TAKE 1 TABLET BY MOUTH TWICE DAILY 180 tablet 0  . nitroGLYCERIN (NITROSTAT) 0.4 MG SL tablet Place 1 tablet (0.4 mg total) under the tongue every 5 (five) minutes as needed. May repeat for up to 3 doses. 25 tablet 1  . pantoprazole (PROTONIX) 20 MG tablet Take 1 tablet (20 mg total) by mouth daily. 90 tablet 0  . RANEXA 1000 MG SR tablet Take 1 tablet (1,000 mg total) by mouth 2 (two) times daily. 180 tablet 3  . rosuvastatin (CRESTOR) 40 MG tablet TAKE 1 TABLET BY MOUTH DAILY 90 tablet 0   No current facility-administered medications for this visit.      Allergies:   Strawberry extract and Dilaudid [hydromorphone hcl]   Social History:  The patient  reports that he has never smoked. He has never used smokeless tobacco. He reports that he does not drink alcohol or use drugs.   Family History:   family history includes Cancer in his paternal uncle; Coronary artery disease in an other family member; Diabetes in his sister and other family  members; Heart attack in his father; Heart disease in his mother, paternal grandfather, and paternal grandmother; Hyperlipidemia in an other family member; Hypertension in his mother and another family member; Lung cancer in his paternal aunt; Ovarian cancer in his sister; Psychiatric Illness in his father.    Review of Systems: Review of Systems  Constitutional: Negative.   Respiratory: Negative.   Cardiovascular: Positive for chest pain.  Gastrointestinal: Negative.   Musculoskeletal: Negative.   Neurological: Negative.   Psychiatric/Behavioral: Negative.   All other systems reviewed and are negative.    PHYSICAL EXAM: VS:  BP 136/72 (BP Location: Left Arm, Patient Position: Sitting, Cuff Size: Normal)   Pulse 61   Ht 5\' 6"  (1.676 m)   Wt 210 lb 8 oz (95.5 kg)   BMI 33.98 kg/m  , BMI Body mass index is 33.98 kg/m. Constitutional:  oriented to person, place, and time. No distress.  HENT:  Head: Grossly normal Eyes:  no discharge. No scleral icterus.  Neck: No JVD, no carotid bruits  Cardiovascular: Regular rate and rhythm, no murmurs appreciated Pulmonary/Chest: Clear to auscultation bilaterally, no wheezes or rails Abdominal: Soft.  no distension.  no tenderness.  Musculoskeletal: Normal range of motion Neurological:  normal muscle tone. Coordination normal. No atrophy Skin: Skin warm and dry Psychiatric: normal affect, pleasant  Recent Labs: No results found for requested labs within last 8760 hours.    Lipid Panel Lab Results  Component Value Date   CHOL 112 12/31/2017   HDL 50 12/31/2017   LDLCALC 40 12/31/2017   TRIG 112 12/31/2017      Wt Readings from Last 3 Encounters:  02/25/19 210 lb 8 oz (95.5 kg)  06/04/18 201 lb (91.2 kg)  05/27/18 200 lb (90.7 kg)     ASSESSMENT AND PLAN:  Coronary artery disease of native artery of native heart with stable angina pectoris ([place]) - Plan: EKG 12-Lead In general doing well but having some low-grade dull chest  discomfort Unclear if this is a complication from Covid We offered echocardiogram, he prefers to wait at this time It does go front to back, possibly secondary to his poor posture sitting in a chair leaning over for hours at a time Otherwise no medication changes made   Essential hypertension - Plan: EKG 12-Lead Blood pressure is well controlled on today's visit. No changes made to the medications.  Paroxysmal atrial fibrillation (HCC) Previous maze procedure ,  No arrhythmia  Hyperlipidemia Numbers at goal, continue statin with Zetia  PATENT FORAMEN OVALE  previous PFO closure     Total encounter time more than 25 minutes  Greater than 50% was spent in counseling and coordination of care with the patient   Disposition:   F/U  6 months   No orders of the defined types were placed in this encounter.    Signed, Esmond Plants, [name], Ph.D. 02/25/2019  [place], [place]

## 2019-02-25 ENCOUNTER — Other Ambulatory Visit: Payer: Self-pay

## 2019-02-25 ENCOUNTER — Encounter: Payer: Self-pay | Admitting: Cardiovascular Disease

## 2019-02-25 ENCOUNTER — Ambulatory Visit (INDEPENDENT_AMBULATORY_CARE_PROVIDER_SITE_OTHER): Payer: 59 | Admitting: Cardiovascular Disease

## 2019-02-25 VITALS — BP 136/72 | HR 61 | Ht 66.0 in | Wt 210.5 lb

## 2019-02-25 DIAGNOSIS — I10 Essential (primary) hypertension: Secondary | ICD-10-CM

## 2019-02-25 DIAGNOSIS — I639 Cerebral infarction, unspecified: Secondary | ICD-10-CM

## 2019-02-25 DIAGNOSIS — G4733 Obstructive sleep apnea (adult) (pediatric): Secondary | ICD-10-CM | POA: Diagnosis not present

## 2019-02-25 DIAGNOSIS — E785 Hyperlipidemia, unspecified: Secondary | ICD-10-CM

## 2019-02-25 DIAGNOSIS — I25110 Atherosclerotic heart disease of native coronary artery with unstable angina pectoris: Secondary | ICD-10-CM

## 2019-02-25 DIAGNOSIS — I480 Paroxysmal atrial fibrillation: Secondary | ICD-10-CM

## 2019-02-25 NOTE — Patient Instructions (Signed)

## 2019-03-13 DIAGNOSIS — G4733 Obstructive sleep apnea (adult) (pediatric): Secondary | ICD-10-CM | POA: Diagnosis not present

## 2019-03-16 ENCOUNTER — Other Ambulatory Visit: Payer: Self-pay | Admitting: Cardiovascular Disease

## 2019-03-16 MED FILL — RANEXA ER 1,000 MG TABLET: 1000 | 30 days supply | Qty: 60 | Fill #1

## 2019-03-16 MED FILL — CLOPIDOGREL 75 MG TABLET: 75 | 90 days supply | Qty: 90 | Fill #0

## 2019-03-23 ENCOUNTER — Other Ambulatory Visit: Payer: Self-pay | Admitting: Family Medicine

## 2019-03-23 NOTE — Telephone Encounter (Signed)
Requested medication (s) are due for refill today: yes  Requested medication (s) are on the active medication list: yes  Last refill:  04/18/2018   Future visit scheduled: No  Notes to clinic:  Last ordered by Francee Piccolo    Requested Prescriptions  Pending Prescriptions Disp Refills   LEXAPRO 20 MG tablet [Pharmacy Med Name: LEXAPRO 20 MG TABLET 20 Tablet] 90 tablet 2    Sig: TAKE 1 TABLET (20 MG TOTAL) BY MOUTH DAILY.      Psychiatry:  Antidepressants - SSRI Failed - 03/23/2019  4:48 PM      Failed - Valid encounter within last 6 months    Recent Outpatient Visits           7 months ago [place], [place]   10 months ago Subacute maxillary sinusitis   [place], [place]   10 months ago Sore throat   [facility] [place], [place]   1 year ago Angiokeratoma of scrotum   [place], [place]   1 year ago Annual physical exam   [place], [place]       Future Appointments             Tomorrow Ward Givens, NP [facility]   In 5 months Gollan, Kathlene November, MD [facility], [place]

## 2019-03-24 ENCOUNTER — Encounter: Payer: Self-pay | Admitting: *Deleted

## 2019-03-24 ENCOUNTER — Telehealth: Payer: Self-pay | Admitting: Adult Health

## 2019-03-24 MED FILL — LEXAPRO 20 MG TABLET: 20 | 90 days supply | Qty: 90 | Fill #0

## 2019-03-24 NOTE — Telephone Encounter (Signed)
Sent mychart note to pt (also wife re: in office appt for pt (due to not able to connect mychart visit this am).  We see pt for memory and cpap.

## 2019-03-24 NOTE — Telephone Encounter (Signed)
Please advise refill? 

## 2019-04-06 ENCOUNTER — Telehealth: Payer: Self-pay | Admitting: *Deleted

## 2019-04-06 NOTE — Telephone Encounter (Signed)
Pt requiring PA for Ranexa 1000 mg tablet bid. PA has been submitted through Covermymeds awaiting response.

## 2019-04-09 NOTE — Telephone Encounter (Signed)
Per fax from [place] 1000MG  has been approved from 04/06/2019 to 04/04/2020.

## 2019-04-13 ENCOUNTER — Other Ambulatory Visit: Payer: Self-pay | Admitting: Family Medicine

## 2019-04-13 DIAGNOSIS — F419 Anxiety disorder, unspecified: Secondary | ICD-10-CM

## 2019-04-13 MED FILL — ALPRAZolam 0.5 MG TABS: 0.5 | 30 days supply | Qty: 60 | Fill #0

## 2019-04-13 MED FILL — RANEXA ER 1,000 MG TABLET: 1000 | 30 days supply | Qty: 60 | Fill #2

## 2019-04-13 NOTE — Telephone Encounter (Signed)
Requested medication (s) are due for refill today: yes  Requested medication (s) are on the active medication list: yes  Last refill: 02/09/2019  Future visit scheduled: no  Notes to clinic:  not delegated; no valid encounter in last 6 months    Requested Prescriptions  Pending Prescriptions Disp Refills   ALPRAZolam (XANAX) 0.5 MG tablet [Pharmacy Med Name: ALPRAZolam 0.5 MG TABS 0.5 Tablet] 60 tablet 0    Sig: TAKE 1 TO 2 TABLETS BY MOUTH EVERY NIGHT AT BEDTIME      Not Delegated - Psychiatry:  Anxiolytics/Hypnotics Failed - 04/13/2019 10:31 AM      Failed - This refill cannot be delegated      Failed - Urine Drug Screen completed in last 360 days.      Failed - Valid encounter within last 6 months    Recent Outpatient Visits           8 months ago [place], [place]   10 months ago Subacute maxillary sinusitis   [place], [place]   11 months ago Sore throat   [facility], [place]ee [place], [place]   1 year ago Angiokeratoma of scrotum   [place], [place]   1 year ago Annual physical exam   [place], [place]       Future Appointments             In 4 months Gollan, Kathlene November, MD [facility], [place]

## 2019-04-15 ENCOUNTER — Encounter: Payer: Self-pay | Admitting: Neurology

## 2019-04-16 ENCOUNTER — Telehealth (INDEPENDENT_AMBULATORY_CARE_PROVIDER_SITE_OTHER): Payer: 59 | Admitting: Adult Health

## 2019-04-16 DIAGNOSIS — Z9989 Dependence on other enabling machines and devices: Secondary | ICD-10-CM

## 2019-04-16 DIAGNOSIS — I69319 Unspecified symptoms and signs involving cognitive functions following cerebral infarction: Secondary | ICD-10-CM

## 2019-04-16 DIAGNOSIS — G4733 Obstructive sleep apnea (adult) (pediatric): Secondary | ICD-10-CM | POA: Diagnosis not present

## 2019-04-16 DIAGNOSIS — R413 Other amnesia: Secondary | ICD-10-CM | POA: Diagnosis not present

## 2019-04-16 NOTE — Progress Notes (Signed)
PATIENT: Kevin [name] DOB: 06/02/1955  REASON FOR VISIT: follow up HISTORY FROM: patient  Virtual Visit via Video Note  I connected with [name] on 04/16/19 at  9:00 AM EST by a video enabled telemedicine application located remotely at [facility] and verified that I am speaking with the correct person using two identifiers who was located at their own home.   I discussed the limitations of evaluation and management by telemedicine and the availability of in person appointments. The patient expressed understanding and agreed to proceed.   PATIENT: Kevin [name] DOB: Jan 11, 1956  REASON FOR VISIT: follow up HISTORY FROM: patient  HISTORY OF PRESENT ILLNESS: Today 04/16/19:   Mr. Mackin is a 64 year old male with a history of obstructive sleep apnea on CPAP, history of stroke and memory disturbance.  He returns today for follow-up.  His download indicates that he uses machine 29 out of 30 days for compliance of 96%.  On average he uses his machine 9 hours and 3 minutes.  He uses machine greater than 4 hours for compliance of 93.3%.  His residual AHI is 2.2 on 5 to 15 cm of water.  He reports that the CPAP works well although he finds the mask leaking during the night.  He states that he constantly will have to adjust the mask.  He states that he gets the best sleep when he takes the mask off for the last hour he is sleeping.  He feels that his memory has remained stable.  He has noticed the most problems with concentration.  He states that it takes him longer to do his finances.  He is able to complete all ADLs independently.  His wife has always managed his medications.  He does operate a motor vehicle but tends to only drive to familiar places.  He returns today for an evaluation.    REVIEW OF SYSTEMS: Out of a complete 14 system review of symptoms, the patient complains only of the following symptoms, and all other reviewed systems are negative.  See  HPI  ALLERGIES: Allergies  Allergen Reactions  . Strawberry Extract Anaphylaxis       . Dilaudid [Hydromorphone Hcl] Nausea And Vomiting    HOME MEDICATIONS: Outpatient Medications Prior to Visit  Medication Sig Dispense Refill  . ALPRAZolam (XANAX) 0.5 MG tablet TAKE 1 TO 2 TABLETS BY MOUTH EVERY NIGHT AT BEDTIME 60 tablet 1  . aspirin EC 81 MG tablet Take 81 mg by mouth daily.    . cetirizine (ZYRTEC) 10 MG tablet Take 10 mg by mouth at bedtime.     . clobetasol ointment (TEMOVATE) AB-[id number] % Apply 1 application topically 2 (two) times daily as needed (for psoriasis).    . clopidogrel (PLAVIX) 75 MG tablet TAKE 1 TABLET BY MOUTH DAILY 90 tablet 0  . desonide (DESOWEN) 0.05 % cream Apply 1 application topically 2 (two) times daily as needed (for psoriasis).    . ezetimibe (ZETIA) 10 MG tablet TAKE 1 TABLET BY MOUTH DAILY 90 tablet 0  . isosorbide mononitrate (IMDUR) 30 MG 24 hr tablet TAKE 1 TABLET BY MOUTH ONCE DAILY 90 tablet 0  . LEXAPRO 20 MG tablet TAKE 1 TABLET (20 MG TOTAL) BY MOUTH DAILY. 90 tablet 1  . metoprolol tartrate (LOPRESSOR) 25 MG tablet TAKE 1 TABLET BY MOUTH TWICE DAILY 180 tablet 0  . nitroGLYCERIN (NITROSTAT) 0.4 MG SL tablet Place 1 tablet (0.4 mg total) under the tongue every 5 (five)  minutes as needed. May repeat for up to 3 doses. 25 tablet 1  . pantoprazole (PROTONIX) 20 MG tablet Take 1 tablet (20 mg total) by mouth daily. 90 tablet 0  . RANEXA 1000 MG SR tablet Take 1 tablet (1,000 mg total) by mouth 2 (two) times daily. 180 tablet 3  . rosuvastatin (CRESTOR) 40 MG tablet TAKE 1 TABLET BY MOUTH DAILY 90 tablet 0   No facility-administered medications prior to visit.    PAST MEDICAL HISTORY: Past Medical History:  Diagnosis Date  . Brachial plexopathy   . CKD (chronic kidney disease), stage II - III ([place])   . Coronary artery disease    a. s/p 4 vessel CABG in 05/2008 w/ LIMA-LAD, SVG-Diag, sequential SVG-OM1/OM2; b. s/p PCI/DES x 2 to LAD in 10/2009; c.  LHC 03/2013 stable disease and patent LAD stents; d. 10/2016 Cath: LM min irregs, LAD 20p, 58m ISR, D2 80ost, LCX small, min irregs, OM1/2/3 min irregs, RCA/RPDA/RPAV min irregs, RPL1/2 nl RPL3 min irregs, VG->D2 nl, VG->OM1->OM2 100, EF 55-65%.  . H/O maze procedure 05/17/2008   a. @ time of CABG  . Hyperlipidemia   . 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   . Hypertension   . Memory deficit after cerebral infarction   . OSA (obstructive sleep apnea)   . Overweight(278.02)   . PAF (paroxysmal atrial fibrillation) ([place])    a. s/p Maze 05/2008, previously on Eliquis->discontinued 05/2016; c. CHADS2VASc => 4 (HTN, stroke x 2, vascular disease)  . Patent foramen ovale    a. 05/2008 s/p closure @ time of CABG.  . Psoriatic arthritis ([place])   . Sleep apnea   . Stroke [facility])    a. 07/2005 right brain; b. 05/2005 left brain; c. Resultant memory deficits.  . Thrombocytopenia ([place])     PAST SURGICAL HISTORY: Past Surgical History:  Procedure Laterality Date  . ACUTE PANCREATITIS  5/11  . APPENDECTOMY  1984  . CHOLECYSTECTOMY  08/19/2009  . COLONOSCOPY WITH PROPOFOL N/A 06/07/2017   Procedure: COLONOSCOPY WITH PROPOFOL;  Surgeon: Virgel Manifold, MD;  Location: [facility] ENDOSCOPY;  Service: Endoscopy;  Laterality: N/A;  . CORONARY ARTERY BYPASS GRAFT  05/12/2008   x4 by PeterVan Trigt,MD  . LEFT HEART CATH AND CORONARY ANGIOGRAPHY N/A 11/29/2016   Procedure: LEFT HEART CATH AND CORONARY ANGIOGRAPHY;  Surgeon: Wellington Hampshire, MD;  Location: [place] [facility];  Service: Cardiovascular;  Laterality: N/A;  . PATENT FORAMEN OVALE CLOSURE    . TONSILLECTOMY     AS A CHILD    FAMILY HISTORY: Family History  Problem Relation Age of Onset  . Diabetes Sister   . Ovarian cancer Sister   . Psychiatric Illness Father   . Heart attack Father   . Diabetes Other   . Hypertension Mother   . Heart disease Mother   . Lung cancer Paternal Aunt   . Coronary artery  disease Other   . Diabetes Other   . Hypertension Other   . Hyperlipidemia Other   . Cancer Paternal Uncle   . Heart disease Paternal Grandmother   . Heart disease Paternal Grandfather     SOCIAL HISTORY: Social History   Socioeconomic History  . Marital status: Married    Spouse name: [name]  . Number of children: 3  . Years of education: Not on file  . Highest education level: Not on file  Occupational History  . Occupation: Full time  Employer: [employer]  Tobacco Use  . Smoking status: Never Smoker  . Smokeless tobacco: Never Used  Substance and Sexual Activity  . Alcohol use: No  . Drug use: No  . Sexual activity: Not on file  Other Topics Concern  . 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 Went to high school. He has worked at his present job for 22 years. He has been married for 34 years. They have 3 children. 2 of his daughters live in [place]. He drinks less than two cups of caffeine per day. He does not use  tobacco or recreational drugs. He has rare alcohol intake. Lives with wife and daughter      OSA diagnosed at [facility] , had his  sleep studies there  reviewed them all in detail today he  has retrognathia, sinusitis,  rhinitis      His ESS remains very high  at 16 and FSS at 57 . His falls assessment tool score is 5.   nasonex, refitted mask, the recent  HST failed to document that  apnea is present at all. education about REM BD and EDS, narcolepsy , cataplexy.  45 minutes.    Social Determinants of Health   Financial Resource Strain:   . Difficulty of Paying Living Expenses: Not on file  Food Insecurity:   . Worried About Charity fundraiser in the Last Year: Not on file  . Ran Out of Food in the Last Year: Not on file  Transportation Needs:   . Lack of Transportation (Medical): Not on file  . Lack of Transportation  (Non-Medical): Not on file  Physical Activity:   . Days of Exercise per Week: Not on file  . Minutes of Exercise per Session: Not on file  Stress:   . Feeling of Stress : Not on file  Social Connections:   . Frequency of Communication with Friends and Family: Not on file  . Frequency of Social Gatherings with Friends and Family: Not on file  . Attends Religious Services: Not on file  . Active Member of Clubs or Organizations: Not on file  . Attends Archivist Meetings: Not on file  . Marital Status: Not on file  Intimate Partner Violence:   . Fear of Current or Ex-Partner: Not on file  . Emotionally Abused: Not on file  . Physically Abused: Not on file  . Sexually Abused: Not on file      PHYSICAL EXAM Generalized: Well developed, in no acute distress   Neurological examination  Mentation: Alert oriented to time, place, history taking. Follows all commands speech and language fluent Cranial nerve II-XII:Extraocular movements were full. Facial symmetry noted. uvula tongue midline. Head turning and shoulder shrug  were normal and symmetric. Motor: Good strength throughout subjectively per patient Sensory: Sensory testing is intact to soft touch on all 4 extremities subjectively per patient Coordination: Cerebellar testing reveals good finger-nose-finger  Gait and station: Patient is able to stand from a seated position. gait is normal.  Reflexes: UTA  DIAGNOSTIC DATA (LABS, IMAGING, TESTING) - I reviewed patient records, labs, notes, testing and imaging myself where available.  Lab Results  Component Value Date   WBC 12.6 (H) 04/01/2017   HGB 13.6 04/01/2017   HCT 40.5 04/01/2017   MCV 95.0 04/01/2017   PLT 228 04/01/2017      Component Value Date/Time  NA 138 04/01/2017 1146   NA 142 06/14/2016 1156   NA 141 03/18/2013 1739   K 4.8 04/01/2017 1146   K 4.0 03/18/2013 1739   CL 102 04/01/2017 1146   CL 107 03/18/2013 1739   CO2 28 04/01/2017 1146   CO2  30 03/18/2013 1739   GLUCOSE 123 (H) 04/01/2017 1146   GLUCOSE 79 03/18/2013 1739   BUN 28 (H) 09/25/2017 1643   BUN 18 06/14/2016 1156   BUN 23 (H) 03/18/2013 1739   CREATININE 1.42 (H) 09/25/2017 1643   CREATININE 1.50 (H) 12/18/2013 1008   CALCIUM 9.4 04/01/2017 1146   CALCIUM 9.2 03/18/2013 1739   PROT 6.9 12/31/2017 0855   ALBUMIN 4.6 12/31/2017 0855   AST 20 12/31/2017 0855   ALT 15 12/31/2017 0855   ALKPHOS 43 12/31/2017 0855   BILITOT 0.7 12/31/2017 0855   GFRNONAA 51 (L) 09/25/2017 1643   GFRNONAA 50 (L) 12/18/2013 1008   GFRAA 59 (L) 09/25/2017 1643   GFRAA 58 (L) 12/18/2013 1008   Lab Results  Component Value Date   CHOL 112 12/31/2017   HDL 50 12/31/2017   LDLCALC 40 12/31/2017   TRIG 112 12/31/2017   CHOLHDL 2.2 12/31/2017   Lab Results  Component Value Date   HGBA1C  05/11/2008    5.5 (NOTE)   The ADA recommends the following therapeutic goal for glycemic   control related to Hgb A1C measurement:   Goal of Therapy:   < 7.0% Hgb A1C   Reference: American Diabetes Association: Clinical Practice   Recommendations 2008, Diabetes Care,  2008, 31:(Suppl 1).   No results found for: VITAMINB12 No results found for: TSH    ASSESSMENT AND PLAN 64 y.o. 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 here with:  1. Obstructive sleep apnea on CPAP 2. History of CVA 3. Cognitive deficits  The patient's CPAP download shows excellent compliance and good treatment of his apnea.  He is encouraged to continue using CPAP nightly and greater than 4  hours each night.  I will send an order over for mask refitting.  Overall the patient feels that his cognition has remained stable.  He does not notice any new changes.  We will continue to monitor.  He is advised that if his symptoms worsen or he develops new symptoms he should let us know.  He will follow-up in      Ward Givens, MSN, NP-C 04/16/2019, 9:51 AM [facility] [address], [place], [place] [address] [phone]

## 2019-05-11 ENCOUNTER — Other Ambulatory Visit: Payer: Self-pay | Admitting: Physician Assistant

## 2019-05-11 ENCOUNTER — Other Ambulatory Visit: Payer: Self-pay | Admitting: Cardiovascular Disease

## 2019-05-11 ENCOUNTER — Other Ambulatory Visit: Payer: Self-pay | Admitting: Gastroenterology

## 2019-05-11 MED FILL — METOPROLOL TARTRATE 25 MG T: 25 | 90 days supply | Qty: 180 | Fill #0

## 2019-05-11 MED FILL — PANTOPRAZOLE SOD DR 20 MG T: 20 | 90 days supply | Qty: 90 | Fill #0

## 2019-05-11 MED FILL — RANEXA ER 1,000 MG TABLET: 1000 | 30 days supply | Qty: 60 | Fill #3

## 2019-05-12 MED FILL — ISOSORBIDE MN ER 30 MG TAB: 30 | 90 days supply | Qty: 90 | Fill #0

## 2019-05-12 MED FILL — ROSUVASTATIN CALCIUM 40 MG: 40 | 90 days supply | Qty: 90 | Fill #0

## 2019-05-12 MED FILL — EZETIMIBE 10 MG TABS: 10 | 90 days supply | Qty: 90 | Fill #0

## 2019-05-19 ENCOUNTER — Telehealth (INDEPENDENT_AMBULATORY_CARE_PROVIDER_SITE_OTHER): Payer: 59 | Admitting: Family Medicine

## 2019-05-19 ENCOUNTER — Encounter: Payer: Self-pay | Admitting: Family Medicine

## 2019-05-19 DIAGNOSIS — H6982 Other specified disorders of Eustachian tube, left ear: Secondary | ICD-10-CM | POA: Diagnosis not present

## 2019-05-19 DIAGNOSIS — G4733 Obstructive sleep apnea (adult) (pediatric): Secondary | ICD-10-CM

## 2019-05-19 NOTE — Progress Notes (Signed)
Kevin [name]  MRN: [id number] DOB: Jul 02, 1955  Subjective:  HPI   The patient is a 64 year old male who presents via video visit through [place].  His complaint is that he has ear pan and believes there to be something in the ear.  The patient states that it started out as pain in the back of the throat and has now progress to ear fullness   Virtual Visit via Telephone Note  I connected with Kevin [name] on 05/19/19 at  9:00 AM EST by telephone and verified that I am speaking with the correct person using two identifiers.  Location: Patient: home Provider: office   I discussed the limitations of evaluation and management by telemedicine and the availability of in person appointments. The patient expressed understanding and agreed to proceed.    Patient Active Problem List   Diagnosis Date Noted  . Long-term memory impairment 06/04/2018  . Psoriasis 08/16/2017  . Diverticulosis of large intestine without diverticulitis   . MCI (mild cognitive impairment) 11/14/2016  . Obstructive sleep apnea 09/22/2015  . BP (high blood pressure) 09/22/2015  . Allergic rhinitis 09/22/2015  . Anxiety 09/22/2015  . 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[place]) 05/14/2014  . 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 Atherosclerosis of coronary artery bypass graft of native heart with stable angina pectoris ([place]) 05/13/2009  . Hyperlipidemia 08/11/2008  . Obesity 08/11/2008  . Coronary atherosclerosis 08/11/2008  . Paroxysmal atrial fibrillation ([place]) 08/11/2008    Past Medical History:  Diagnosis Date  . Brachial plexopathy   . CKD (chronic kidney disease), stage II - III ([place])   . Coronary artery disease    a. s/p 4 vessel CABG in 05/2008 w/ LIMA-LAD, SVG-Diag, sequential SVG-OM1/OM2; b. s/p PCI/DES x 2 to LAD in 10/2009; c. LHC 03/2013 stable disease and patent LAD stents; d. 10/2016 Cath: LM min irregs, LAD 20p, 66m ISR, D2 80ost, LCX small, min irregs, OM1/2/3 min irregs,  RCA/RPDA/RPAV min irregs, RPL1/2 nl RPL3 min irregs, VG->D2 nl, VG->OM1->OM2 100, EF 55-65%.  . H/O maze procedure 05/17/2008   a. @ time of CABG  . Hyperlipidemia   . 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   . Hypertension   . Memory deficit after cerebral infarction   . OSA (obstructive sleep apnea)   . Overweight(278.02)   . PAF (paroxysmal atrial fibrillation) ([place])    a. s/p Maze 05/2008, previously on Eliquis->discontinued 05/2016; c. CHADS2VASc => 4 (HTN, stroke x 2, vascular disease)  . Patent foramen ovale    a. 05/2008 s/p closure @ time of CABG.  . Psoriatic arthritis ([place])   . Sleep apnea   . Stroke [facility])    a. 07/2005 right brain; b. 05/2005 left brain; c. Resultant memory deficits.  . Thrombocytopenia ([place])    Past Surgical History:  Procedure Laterality Date  . ACUTE PANCREATITIS  5/11  . APPENDECTOMY  1984  . CHOLECYSTECTOMY  08/19/2009  . COLONOSCOPY WITH PROPOFOL N/A 06/07/2017   Procedure: COLONOSCOPY WITH PROPOFOL;  Surgeon: Virgel Manifold, MD;  Location: [facility] ENDOSCOPY;  Service: Endoscopy;  Laterality: N/A;  . CORONARY ARTERY BYPASS GRAFT  05/12/2008   x4 by PeterVan Trigt,MD  . LEFT HEART CATH AND CORONARY ANGIOGRAPHY N/A 11/29/2016   Procedure: LEFT HEART CATH AND CORONARY ANGIOGRAPHY;  Surgeon: Wellington Hampshire, MD;  Location: [place] [facility];  Service: Cardiovascular;  Laterality: N/A;  . PATENT FORAMEN  OVALE CLOSURE    . TONSILLECTOMY     AS A CHILD   Social History   Socioeconomic History  . Marital status: Married    Spouse name: Kevin [name]  . Number of children: 3  . Years of education: Not on file  . Highest education level: Not on file  Occupational History  . Occupation: Full time    Employer: [employer]  Tobacco Use  . Smoking status: Never Smoker  . Smokeless tobacco: Never Used  Substance and Sexual Activity  . Alcohol use: No  . Drug use: No  . Sexual activity: Not on file  Other  Topics Concern  . 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 Went to high school. He has worked at his present job for 22 years. He has been married for 34 years. They have 3 children. 2 of his daughters live in [place]. He drinks less than two cups of caffeine per day. He does not use  tobacco or recreational drugs. He has rare alcohol intake. Lives with wife and daughter      OSA diagnosed at [facility] , had his  sleep studies there  reviewed them all in detail today he  has retrognathia, sinusitis,  rhinitis      His ESS remains very high  at 16 and FSS at 32 . His falls assessment tool score is 5.   nasonex, refitted mask, the recent  HST failed to document that  apnea is present at all. education about REM BD and EDS, narcolepsy , cataplexy.  45 minutes.    Social Determinants of Health   Financial Resource Strain:   . Difficulty of Paying Living Expenses: Not on file  Food Insecurity:   . Worried About Charity fundraiser in the Last Year: Not on file  . Ran Out of Food in the Last Year: Not on file  Transportation Needs:   . Lack of Transportation (Medical): Not on file  . Lack of Transportation (Non-Medical): Not on file  Physical Activity:   . Days of Exercise per Week: Not on file  . Minutes of Exercise per Session: Not on file  Stress:   . Feeling of Stress : Not on file  Social Connections:   . Frequency of Communication with Friends and Family: Not on file  . Frequency of Social Gatherings with Friends and Family: Not on file  . Attends Religious Services: Not on file  . Active Member of Clubs or Organizations: Not on file  . Attends Archivist Meetings: Not on file  . Marital Status: Not on file  Intimate Partner Violence:   . Fear of Current or Ex-Partner: Not on file  . Emotionally Abused: Not on file  . Physically Abused: Not on file    . Sexually Abused: Not on file    Outpatient Encounter Medications as of 05/19/2019  Medication Sig  . ALPRAZolam (XANAX) 0.5 MG tablet TAKE 1 TO 2 TABLETS BY MOUTH EVERY NIGHT AT BEDTIME  . aspirin EC 81 MG tablet Take 81 mg by mouth daily.  . cetirizine (ZYRTEC) 10 MG tablet Take 10 mg by mouth at bedtime.   . clobetasol ointment (TEMOVATE) AB-123456789 % Apply 1 application topically 2 (two) times daily as needed (for psoriasis).  . clopidogrel (PLAVIX) 75 MG tablet TAKE 1 TABLET BY MOUTH DAILY  . desonide (DESOWEN) 0.05 %  cream Apply 1 application topically 2 (two) times daily as needed (for psoriasis).  . ezetimibe (ZETIA) 10 MG tablet TAKE 1 TABLET BY MOUTH DAILY  . isosorbide mononitrate (IMDUR) 30 MG 24 hr tablet TAKE 1 TABLET BY MOUTH ONCE DAILY  . LEXAPRO 20 MG tablet TAKE 1 TABLET (20 MG TOTAL) BY MOUTH DAILY.  . metoprolol tartrate (LOPRESSOR) 25 MG tablet TAKE 1 TABLET BY MOUTH TWICE DAILY  . nitroGLYCERIN (NITROSTAT) 0.4 MG SL tablet Place 1 tablet (0.4 mg total) under the tongue every 5 (five) minutes as needed. May repeat for up to 3 doses.  . pantoprazole (PROTONIX) 20 MG tablet TAKE 1 TABLET BY MOUTH ONCE DAILY  . RANEXA 1000 MG SR tablet Take 1 tablet (1,000 mg total) by mouth 2 (two) times daily.  . rosuvastatin (CRESTOR) 40 MG tablet TAKE 1 TABLET BY MOUTH ONCE DAILY   No facility-administered encounter medications on file as of 05/19/2019.    Allergies  Allergen Reactions  . Strawberry Extract Anaphylaxis       . Dilaudid [Hydromorphone Hcl] Nausea And Vomiting    Review of Systems  Constitutional: Positive for diaphoresis (night sweats). Negative for chills and fever.  HENT: Positive for ear pain. Negative for congestion, sinus pain and sore throat.        Post nasal drainage  Respiratory: Positive for cough (chronic). Negative for hemoptysis, sputum production, shortness of breath and wheezing.   Cardiovascular: Negative for chest pain, palpitations and leg  swelling.    Objective:  There were no vitals taken for this visit.  Physical Exam: No respiratory distress during telephonic interview. Hearing seems to be normal.  Assessment and Plan :   1. Dysfunction of left eustachian tube Developed left side of throat scratching and left ear pressure over the past week. Feels the throat irritation started the day after eating some hard corn chips (remembers a scratching after swallowing a large piece of a chip). Feeling PND "all the time now" with slight nausea in the morning until he drinks some cranberry juice. Recommend continuing the Corcidin-HBP with Flonase and add Mucinex. May get a little extra relief with throat irritation by using a throat spray or saltwater gargles. Watch pressure on ears from CPAP and monitor for COVID symptoms. Continue COVID restrictions. Follow up prn.  2. Obstructive sleep apnea Using CPAP every night with good sleep pattern. No snoring and tolerating CPAP pressure.   I discussed the assessment and treatment plan with the patient. The patient was provided an opportunity to ask questions and all were answered. The patient agreed with the plan and demonstrated an understanding of the instructions.   The patient was advised to call back or seek an in-person evaluation if the symptoms worsen or if the condition fails to improve as anticipated.  I provided 15 minutes of non-face-to-face time during this encounter.

## 2019-05-27 ENCOUNTER — Telehealth: Payer: Self-pay

## 2019-05-27 ENCOUNTER — Other Ambulatory Visit: Payer: Self-pay

## 2019-05-27 ENCOUNTER — Encounter: Payer: Self-pay | Admitting: Emergency Medicine

## 2019-05-27 ENCOUNTER — Ambulatory Visit
Admission: EM | Admit: 2019-05-27 | Discharge: 2019-05-27 | Disposition: A | Discharge status: Home / Self Care | Payer: 59 | Attending: Emergency Medicine | Admitting: Emergency Medicine

## 2019-05-27 DIAGNOSIS — J0110 Acute frontal sinusitis, unspecified: Secondary | ICD-10-CM | POA: Insufficient documentation

## 2019-05-27 DIAGNOSIS — J029 Acute pharyngitis, unspecified: Secondary | ICD-10-CM | POA: Diagnosis not present

## 2019-05-27 LAB — POCT RAPID STREP A (OFFICE): Rapid Strep A Screen: NEGATIVE

## 2019-05-27 MED ORDER — AMOXICILLIN 875 MG PO TABS: 875.0000 mg | ORAL_TABLET | Freq: Two times a day (BID) | ORAL | 0 refills | Status: AC

## 2019-05-27 NOTE — ED Provider Notes (Signed)
Kevin [name]    CSN: [id number] Arrival date & time: 05/27/19  1455      History   Chief Complaint Chief Complaint  Patient presents with  . Sore Throat    HPI Kevin [name] is a 64 y.o. male.   Patient presents with sore throat x10 days.  He states it feels like something is stuck in the back of his throat.  No difficulty swallowing, speaking, or breathing.  He also reports postnasal drip, congestion, and frontal sinus headache.  He was seen by his PCP for similar symptoms on 05/19/2019 via video visit, diagnosed with eustachian tube dysfunction and treated symptomatically.  Patient denies fever, chills, cough, shortness of breath, vomiting, diarrhea, rash, or other symptoms.  Treatment attempted at home with Coricidin HBP, Flonase, Mucinex.    The history is provided by the patient.    Past Medical History:  Diagnosis Date  . Brachial plexopathy   . CKD (chronic kidney disease), stage II - III ([place])   . Coronary artery disease    a. s/p 4 vessel CABG in 05/2008 w/ LIMA-LAD, SVG-Diag, sequential SVG-OM1/OM2; b. s/p PCI/DES x 2 to LAD in 10/2009; c. LHC 03/2013 stable disease and patent LAD stents; d. 10/2016 Cath: LM min irregs, LAD 20p, 75m ISR, D2 80ost, LCX small, min irregs, OM1/2/3 min irregs, RCA/RPDA/RPAV min irregs, RPL1/2 nl RPL3 min irregs, VG->D2 nl, VG->OM1->OM2 100, EF 55-65%.  . H/O maze procedure 05/17/2008   a. @ time of CABG  . Hyperlipidemia   . 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   . Hypertension   . Memory deficit after cerebral infarction   . OSA (obstructive sleep apnea)   . Overweight(278.02)   . PAF (paroxysmal atrial fibrillation) ([place])    a. s/p Maze 05/2008, previously on Eliquis->discontinued 05/2016; c. CHADS2VASc => 4 (HTN, stroke x 2, vascular disease)  . Patent foramen ovale    a. 05/2008 s/p closure @ time of CABG.  . Psoriatic arthritis ([employer])   . Sleep apnea   . Stroke [facility])    a. 07/2005 right  brain; b. 05/2005 left brain; c. Resultant memory deficits.  . Thrombocytopenia [name])     Patient Active Problem List   Diagnosis Date Noted  . Long-term memory impairment 06/04/2018  . Psoriasis 08/16/2017  . Diverticulosis of large intestine without diverticulitis   . MCI (mild cognitive impairment) 11/14/2016  . Obstructive sleep apnea 09/22/2015  . BP (high blood pressure) 09/22/2015  . Allergic rhinitis 09/22/2015  . Anxiety 09/22/2015  . 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[place]) 05/14/2014  . 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 Atherosclerosis of coronary artery bypass graft of native heart with stable angina pectoris ([place]) 05/13/2009  . Hyperlipidemia 08/11/2008  . Obesity 08/11/2008  . Coronary atherosclerosis 08/11/2008  . Paroxysmal atrial fibrillation ([place]) 08/11/2008    Past Surgical History:  Procedure Laterality Date  . ACUTE PANCREATITIS  5/11  . APPENDECTOMY  1984  . CHOLECYSTECTOMY  08/19/2009  . COLONOSCOPY WITH PROPOFOL N/A 06/07/2017   Procedure: COLONOSCOPY WITH PROPOFOL;  Surgeon: Virgel Manifold, MD;  Location: [facility] ENDOSCOPY;  Service: Endoscopy;  Laterality: N/A;  . CORONARY ARTERY BYPASS GRAFT  05/12/2008   x4 by PeterVan Trigt,MD  . LEFT HEART CATH AND CORONARY ANGIOGRAPHY N/A 11/29/2016   Procedure: LEFT HEART CATH AND CORONARY ANGIOGRAPHY;  Surgeon: Wellington Hampshire, MD;  Location: [place] CV LAB;  Service:  Cardiovascular;  Laterality: N/A;  . PATENT FORAMEN [place] CLOSURE    . TONSILLECTOMY     AS A CHILD       Home Medications    Prior to Admission medications   Medication Sig Start Date End Date Taking? Authorizing Provider  ALPRAZolam Duanne Moron) 0.5 MG tablet TAKE 1 TO 2 TABLETS BY MOUTH EVERY NIGHT AT BEDTIME 04/13/19  Yes Birdie Sons, MD  aspirin EC 81 MG tablet Take 81 mg by mouth daily.   Yes [provider]  cetirizine (ZYRTEC) 10 MG tablet Take 10 mg by mouth at bedtime.    Yes [provider]  clobetasol ointment (TEMOVATE) AB-123456789 % Apply 1 application topically 2 (two) times daily as needed (for psoriasis).   Yes [provider]  clopidogrel (PLAVIX) 75 MG tablet TAKE 1 TABLET BY MOUTH DAILY 03/16/19  Yes Gollan, Kathlene November, MD  desonide (DESOWEN) 0.05 % cream Apply 1 application topically 2 (two) times daily as needed (for psoriasis).   Yes [provider]  ezetimibe (ZETIA) 10 MG tablet TAKE 1 TABLET BY MOUTH DAILY 05/12/19  Yes Gollan, Kathlene November, MD  isosorbide mononitrate (IMDUR) 30 MG 24 hr tablet TAKE 1 TABLET BY MOUTH ONCE DAILY 05/12/19  Yes Gollan, Kathlene November, MD  LEXAPRO 20 MG tablet TAKE 1 TABLET (20 MG TOTAL) BY MOUTH DAILY. 03/24/19  Yes Birdie Sons, MD  metoprolol tartrate (LOPRESSOR) 25 MG tablet TAKE 1 TABLET BY MOUTH TWICE DAILY 05/11/19  Yes Gollan, Kathlene November, MD  nitroGLYCERIN (NITROSTAT) 0.4 MG SL tablet Place 1 tablet (0.4 mg total) under the tongue every 5 (five) minutes as needed. May repeat for up to 3 doses. 04/22/18  Yes Gollan, Kathlene November, MD  pantoprazole (PROTONIX) 20 MG tablet TAKE 1 TABLET BY MOUTH ONCE DAILY 05/11/19  Yes Tahiliani, Lennette Bihari, MD  RANEXA 1000 MG SR tablet Take 1 tablet (1,000 mg total) by mouth 2 (two) times daily. 12/10/18  Yes Theora Gianotti, NP  rosuvastatin (CRESTOR) 40 MG tablet TAKE 1 TABLET BY MOUTH ONCE DAILY 05/12/19  Yes Gollan, Kathlene November, MD  amoxicillin (AMOXIL) 875 MG tablet Take 1 tablet (875 mg total) by mouth 2 (two) times daily for 7 days. 05/27/19 06/03/19  Sharion Balloon, NP    Family History Family History  Problem Relation Age of Onset  . Diabetes Sister   . Ovarian cancer Sister   . Psychiatric Illness Father   . Heart attack Father   . Diabetes Other   . Hypertension Mother   . Heart disease Mother   . Lung cancer Paternal Aunt   . Coronary artery disease Other   . Diabetes Other   . Hypertension Other   . Hyperlipidemia Other   . Cancer Paternal Uncle   . Heart disease  Paternal Grandmother   . Heart disease Paternal Grandfather     Social History Social History   Tobacco Use  . Smoking status: Never Smoker  . Smokeless tobacco: Never Used  Substance Use Topics  . Alcohol use: No  . Drug use: No     Allergies   Strawberry extract and Dilaudid [hydromorphone hcl]   Review of Systems Review of Systems  Constitutional: Negative for chills and fever.  HENT: Positive for congestion, postnasal drip, sinus pressure and sore throat. Negative for ear pain and trouble swallowing.   Eyes: Negative for pain and visual disturbance.  Respiratory: Negative for cough and shortness of breath.   Cardiovascular: Negative for  chest pain and palpitations.  Gastrointestinal: Negative for abdominal pain, diarrhea, nausea and vomiting.  Genitourinary: Negative for dysuria and hematuria.  Musculoskeletal: Negative for arthralgias and back pain.  Skin: Negative for color change and rash.  Neurological: Negative for seizures and syncope.  All other systems reviewed and are negative.    Physical Exam Triage Vital Signs ED Triage Vitals [05/27/19 1507]  Enc Vitals Group     BP 111/72     Pulse Rate 70     Resp 18     Temp 98.8 F (37.1 C)     Temp Source Oral     SpO2 95 %     Weight      Height      Head Circumference      Peak Flow      Pain Score      Pain Loc      Pain Edu?      Excl. in [place]?    No data found.  Updated Vital Signs BP 111/72 (BP Location: Left Arm)   Pulse 70   Temp 98.8 F (37.1 C) (Oral)   Resp 18   Ht 5\' 6"  (1.676 m)   Wt 200 lb (90.7 kg)   SpO2 95%   BMI 32.28 kg/m   Visual Acuity Right Eye Distance:   Left Eye Distance:   Bilateral Distance:    Right Eye Near:   Left Eye Near:    Bilateral Near:     Physical Exam Vitals and nursing note reviewed.  Constitutional:      Appearance: He is well-developed.  HENT:     Head: Normocephalic and atraumatic.     Right Ear: Tympanic membrane normal.     Left Ear:  Tympanic membrane normal.     Nose: Nose normal.     Mouth/Throat:     Mouth: Mucous membranes are moist.     Pharynx: Posterior oropharyngeal erythema present.  Eyes:     Conjunctiva/sclera: Conjunctivae normal.  Cardiovascular:     Rate and Rhythm: Normal rate and regular rhythm.     Heart sounds: No murmur.  Pulmonary:     Effort: Pulmonary effort is normal. No respiratory distress.     Breath sounds: Normal breath sounds.  Abdominal:     General: Bowel sounds are normal.     Palpations: Abdomen is soft.     Tenderness: There is no abdominal tenderness. There is no guarding or rebound.  Musculoskeletal:     Cervical back: Neck supple.  Skin:    General: Skin is warm and dry.     Findings: No rash.  Neurological:     General: No focal deficit present.     Mental Status: He is alert and oriented to person, place, and time.  Psychiatric:        Mood and Affect: Mood normal.        Behavior: Behavior normal.      UC Treatments / Results  Labs (all labs ordered are listed, but only abnormal results are displayed) Labs Reviewed  CULTURE, GROUP A STREP [facility])  POCT RAPID STREP A (OFFICE)    EKG   Radiology No results found.  Procedures Procedures (including critical care time)  Medications Ordered in UC Medications - No data to display  Initial Impression / Assessment and Plan / UC Course  I have reviewed the triage vital signs and the nursing notes.  Pertinent labs & imaging results that were available during my care of  the patient were reviewed by me and considered in my medical decision making (see chart for details).   Sore throat, Sinusitis.  Treating with amoxicillin.  Instructed patient to continue taking OTC medications as needed.  Instructed him to follow-up with his PCP or an ENT if his symptoms are not improving.  Patient agrees to plan of care.      Final Clinical Impressions(s) / UC Diagnoses   Final diagnoses:  Sore throat  Acute  non-recurrent frontal sinusitis     Discharge Instructions     Take the amoxicillin as directed.    Follow up with your primary care provider or an ENT if your symptoms are not improving.        ED Prescriptions    Medication Sig Dispense Auth. Provider   amoxicillin (AMOXIL) 875 MG tablet Take 1 tablet (875 mg total) by mouth 2 (two) times daily for 7 days. 14 tablet Sharion Balloon, NP     PDMP not reviewed this encounter.   Sharion Balloon, NP 05/27/19 [phone]

## 2019-05-27 NOTE — Discharge Instructions (Addendum)
Take the amoxicillin as directed.    Follow up with your primary care provider or an ENT if your symptoms are not improving.

## 2019-05-27 NOTE — ED Triage Notes (Signed)
Pt c/o sore throat. Started about 10 days ago. He states at first he thought it was something in there. He states that he can see a white patch in his throat. He states he does not feel bad. He saw his PCP via e visit and told it was post nasal drainage but pt feels it should be cleared up by now. Pt has tried mucinex.

## 2019-05-27 NOTE — Telephone Encounter (Signed)
I spoke with the patient, he was recently seen on 05/19/19 with symptoms of sore throat, ear fullness/pain, and some cough.  Now has complaints of possible foreign body in throat, described seeing something white at the back opf his throat.  Recommended to patient that he would probably need to have an in person visit and was advised to be seen at an urgent care or ER.  He was advised that if this interfered with his breathing in any way he should then immediately go to the ER.  Patient verbalized understanding and states he will get with his wife and seek treatment.     Copied from [place] [id number]. Topic: Appointment Scheduling - Scheduling Inquiry for Clinic >> May 27, 2019  9:34 AM Mathis Bud wrote: Reason for CRM: Patient called stating he believes a piece of plastic from his cpap machine fell into the back of his throat. Was setting up an appt for him with Terrilee Croak today, going over covid questions, he said he had been exposed. Patient states he just walked in the person office that has covid.  Patient really wants to come into the office and not have virtual.   Call back [phone]

## 2019-05-30 LAB — CULTURE, GROUP A STREP (THRC)

## 2019-06-08 ENCOUNTER — Other Ambulatory Visit (HOSPITAL_COMMUNITY): Payer: Self-pay | Admitting: Dermatology

## 2019-06-08 ENCOUNTER — Other Ambulatory Visit: Payer: Self-pay | Admitting: Cardiovascular Disease

## 2019-06-08 DIAGNOSIS — L570 Actinic keratosis: Secondary | ICD-10-CM | POA: Diagnosis not present

## 2019-06-08 DIAGNOSIS — L821 Other seborrheic keratosis: Secondary | ICD-10-CM | POA: Diagnosis not present

## 2019-06-08 DIAGNOSIS — L820 Inflamed seborrheic keratosis: Secondary | ICD-10-CM | POA: Diagnosis not present

## 2019-06-08 DIAGNOSIS — L400 Psoriasis vulgaris: Secondary | ICD-10-CM | POA: Diagnosis not present

## 2019-06-08 DIAGNOSIS — D2271 Melanocytic nevi of right lower limb, including hip: Secondary | ICD-10-CM | POA: Diagnosis not present

## 2019-06-08 DIAGNOSIS — X32XXXA Exposure to sunlight, initial encounter: Secondary | ICD-10-CM | POA: Diagnosis not present

## 2019-06-08 DIAGNOSIS — Z85828 Personal history of other malignant neoplasm of skin: Secondary | ICD-10-CM | POA: Diagnosis not present

## 2019-06-08 DIAGNOSIS — L538 Other specified erythematous conditions: Secondary | ICD-10-CM | POA: Diagnosis not present

## 2019-06-08 DIAGNOSIS — D2262 Melanocytic nevi of left upper limb, including shoulder: Secondary | ICD-10-CM | POA: Diagnosis not present

## 2019-06-08 DIAGNOSIS — L218 Other seborrheic dermatitis: Secondary | ICD-10-CM | POA: Diagnosis not present

## 2019-06-08 MED FILL — RANEXA ER 1,000 MG TABLET: 1000 | 30 days supply | Qty: 60 | Fill #4

## 2019-06-08 MED FILL — CLOPIDOGREL 75 MG TABLET: 75 | 90 days supply | Qty: 90 | Fill #0

## 2019-06-15 MED FILL — LEXAPRO 20 MG TABLET: 20 | 90 days supply | Qty: 90 | Fill #1

## 2019-06-15 MED FILL — ALPRAZolam 0.5 MG TABS: 0.5 | 30 days supply | Qty: 60 | Fill #1

## 2019-06-22 DIAGNOSIS — G4733 Obstructive sleep apnea (adult) (pediatric): Secondary | ICD-10-CM | POA: Diagnosis not present

## 2019-06-26 DIAGNOSIS — H2511 Age-related nuclear cataract, right eye: Secondary | ICD-10-CM | POA: Diagnosis not present

## 2019-07-13 MED FILL — RANEXA ER 1,000 MG TABLET: 1000 | 30 days supply | Qty: 60 | Fill #5

## 2019-08-05 DIAGNOSIS — M25562 Pain in left knee: Secondary | ICD-10-CM | POA: Diagnosis not present

## 2019-08-05 DIAGNOSIS — M25561 Pain in right knee: Secondary | ICD-10-CM | POA: Diagnosis not present

## 2019-08-05 DIAGNOSIS — M2550 Pain in unspecified joint: Secondary | ICD-10-CM | POA: Diagnosis not present

## 2019-08-05 DIAGNOSIS — Z1159 Encounter for screening for other viral diseases: Secondary | ICD-10-CM | POA: Diagnosis not present

## 2019-08-05 DIAGNOSIS — L409 Psoriasis, unspecified: Secondary | ICD-10-CM | POA: Diagnosis not present

## 2019-08-05 DIAGNOSIS — M19041 Primary osteoarthritis, right hand: Secondary | ICD-10-CM | POA: Diagnosis not present

## 2019-08-05 MED FILL — ARTHRITIS PAIN ER 650 MG CA: 650 | 34 days supply | Qty: 100 | Fill #0

## 2019-08-07 ENCOUNTER — Other Ambulatory Visit: Payer: Self-pay | Admitting: Family Medicine

## 2019-08-07 ENCOUNTER — Other Ambulatory Visit: Payer: Self-pay | Admitting: Cardiovascular Disease

## 2019-08-07 ENCOUNTER — Other Ambulatory Visit: Payer: Self-pay | Admitting: Gastroenterology

## 2019-08-07 DIAGNOSIS — F419 Anxiety disorder, unspecified: Secondary | ICD-10-CM

## 2019-08-07 MED FILL — RANEXA ER 1,000 MG TABLET: 1000 | 30 days supply | Qty: 60 | Fill #6

## 2019-08-07 NOTE — Telephone Encounter (Signed)
Requested medication (s) are due for refill today: Yes  Requested medication (s) are on the active medication list: Yes  Last refill:  04/13/19  Future visit scheduled: No  Notes to clinic:  See request.    Requested Prescriptions  Pending Prescriptions Disp Refills   ALPRAZolam (XANAX) 0.5 MG tablet [Pharmacy Med Name: ALPRAZolam 0.5 MG TABS 0.5 Tablet] 60 tablet 1    Sig: TAKE 1 TO 2 TABLETS BY MOUTH EVERY EVENING AT BEDTIME      Not Delegated - Psychiatry:  Anxiolytics/Hypnotics Failed - 08/07/2019 11:51 AM      Failed - This refill cannot be delegated      Failed - Urine Drug Screen completed in last 360 days.      Passed - Valid encounter within last 6 months    Recent Outpatient Visits           2 months ago Dysfunction of left eustachian tube   [place], Vickki Muff, [place]   1 year ago [place], [place]   1 year ago Subacute maxillary sinusitis   [place], [place]   1 year ago Sore throat   [facility] Trinna Post, [place]   1 year ago Angiokeratoma of scrotum   [place], Herbie [place], [place]       Future Appointments             In 1 week Virgel Manifold, MD [place]   In 3 weeks [place], Jacquelyn D, PA-C [place], [facility]

## 2019-08-10 DIAGNOSIS — M19042 Primary osteoarthritis, left hand: Secondary | ICD-10-CM | POA: Diagnosis not present

## 2019-08-10 DIAGNOSIS — M533 Sacrococcygeal disorders, not elsewhere classified: Secondary | ICD-10-CM | POA: Diagnosis not present

## 2019-08-14 ENCOUNTER — Other Ambulatory Visit: Payer: Self-pay

## 2019-08-14 NOTE — Telephone Encounter (Signed)
Patient is requesting a refill on his Pantoprazole. Patient is coming for a follow up appointment on 08/20/2019 with Dr. Bonna Gains.

## 2019-08-17 ENCOUNTER — Other Ambulatory Visit: Payer: Self-pay | Admitting: Cardiovascular Disease

## 2019-08-17 MED FILL — ROSUVASTATIN CALCIUM 40 MG: 40 | 90 days supply | Qty: 90 | Fill #0

## 2019-08-17 MED FILL — METOPROLOL TARTRATE 25 MG T: 25 | 90 days supply | Qty: 180 | Fill #0

## 2019-08-20 ENCOUNTER — Encounter: Payer: Self-pay | Admitting: Gastroenterology

## 2019-08-20 ENCOUNTER — Other Ambulatory Visit: Payer: Self-pay

## 2019-08-20 ENCOUNTER — Ambulatory Visit (INDEPENDENT_AMBULATORY_CARE_PROVIDER_SITE_OTHER): Payer: 59 | Admitting: Gastroenterology

## 2019-08-20 VITALS — BP 126/76 | HR 68 | Temp 98.0°F | Ht 66.0 in | Wt 209.5 lb

## 2019-08-20 DIAGNOSIS — L409 Psoriasis, unspecified: Secondary | ICD-10-CM | POA: Diagnosis not present

## 2019-08-20 DIAGNOSIS — R109 Unspecified abdominal pain: Secondary | ICD-10-CM | POA: Diagnosis not present

## 2019-08-20 DIAGNOSIS — M47816 Spondylosis without myelopathy or radiculopathy, lumbar region: Secondary | ICD-10-CM | POA: Insufficient documentation

## 2019-08-21 NOTE — Progress Notes (Signed)
[name], MD [address]  [place]  [place], [place] [address]  Main: [phone]  Fax: [phone]   Primary Care Physician: [place]   Chief Complaint  Patient presents with  . Nausea    Has nausea that comes and goes     HPI: Kevin [name] is a 64 y.o. male reports that he has been having a feeling that "his stomach is off" intermittently.  He does not have any abdominal pain during these episodes.  No altered bowel habits.  Last for about an hour and goes away.  No trigger foods or events.  His PPI was decreased to the current dosage of 20 mg once daily on last visit and the symptoms seems to have started after that.  No dysphagia.  Has had previous episode of diverticulitis in December 2018.  Colonoscopy March 2019 showed diverticulosis and no colon mass.  Also pancreatitis in 2011 and cholecystectomy at the time  Current Outpatient Medications  Medication Sig Dispense Refill  . ALPRAZolam (XANAX) 0.5 MG tablet TAKE 1 TO 2 TABLETS BY MOUTH EVERY EVENING AT BEDTIME 60 tablet 1  . ARTHRITIS PAIN RELIEF 650 MG CR tablet Take 650 mg by mouth every 8 (eight) hours as needed.    Marland Kitchen aspirin EC 81 MG tablet Take 81 mg by mouth daily.    . cetirizine (ZYRTEC) 10 MG tablet Take 10 mg by mouth at bedtime.     . clobetasol ointment (TEMOVATE) [id number] % Apply 1 application topically 2 (two) times daily as needed (for psoriasis).    . clopidogrel (PLAVIX) 75 MG tablet TAKE 1 TABLET BY MOUTH ONCE DAILY 90 tablet 0  . desonide (DESOWEN) 0.05 % cream Apply 1 application topically 2 (two) times daily as needed (for psoriasis).    . ezetimibe (ZETIA) 10 MG tablet TAKE 1 TABLET BY MOUTH DAILY 90 tablet 0  . isosorbide mononitrate (IMDUR) 30 MG 24 hr tablet TAKE 1 TABLET BY MOUTH ONCE DAILY 90 tablet 0  . LEXAPRO 20 MG tablet TAKE 1 TABLET (20 MG TOTAL) BY MOUTH DAILY. 90 tablet 1  . metoprolol tartrate (LOPRESSOR) 25 MG tablet TAKE 1 TABLET BY MOUTH TWO TIMES  DAILY 180 tablet 0  . nitroGLYCERIN (NITROSTAT) 0.4 MG SL tablet Place 1 tablet (0.4 mg total) under the tongue every 5 (five) minutes as needed. May repeat for up to 3 doses. 25 tablet 1  . pantoprazole (PROTONIX) 20 MG tablet TAKE 1 TABLET BY MOUTH ONCE DAILY 90 tablet 0  . RANEXA 1000 MG SR tablet Take 1 tablet (1,000 mg total) by mouth 2 (two) times daily. 180 tablet 3  . rosuvastatin (CRESTOR) 40 MG tablet TAKE 1 TABLET BY MOUTH ONCE DAILY 90 tablet 0   No current facility-administered medications for this visit.    Allergies as of 08/20/2019 - Review Complete 08/20/2019  Allergen Reaction Noted  . Strawberry extract Anaphylaxis 12/04/2011  . Dilaudid [hydromorphone hcl] Nausea And Vomiting 06/21/2010    ROS:  General: Negative for anorexia, weight loss, fever, chills, fatigue, weakness. ENT: Negative for hoarseness, difficulty swallowing , nasal congestion. CV: Negative for chest pain, angina, palpitations, dyspnea on exertion, peripheral edema.  Respiratory: Negative for dyspnea at rest, dyspnea on exertion, cough, sputum, wheezing.  GI: See history of present illness. GU:  Negative for dysuria, hematuria, urinary incontinence, urinary frequency, nocturnal urination.  Endo: Negative for unusual weight change.    Physical Examination:   BP 126/76 (BP Location: Left  Arm, Patient Position: Sitting, Cuff Size: Normal)   Pulse 68   Temp 98 F (36.7 C) (Oral)   Ht 5\' 6"  (1.676 m)   Wt 209 lb 8 oz (95 kg)   BMI 33.81 kg/m   General: Well-nourished, well-developed in no acute distress.  Eyes: No icterus. Conjunctivae pink. Mouth: Oropharyngeal mucosa moist and pink , no lesions erythema or exudate. Neck: Supple, Trachea midline Abdomen: Bowel sounds are normal, nontender, nondistended, no hepatosplenomegaly or masses, no abdominal bruits or hernia , no rebound or guarding.   Extremities: No lower extremity edema. No clubbing or deformities. Neuro: Alert and oriented x 3.   Grossly intact. Skin: Warm and dry, no jaundice.   Psych: Alert and cooperative, normal mood and affect.   Labs: CMP     Component Value Date/Time   NA 138 04/01/2017 1146   NA 142 06/14/2016 1156   NA 141 03/18/2013 1739   K 4.8 04/01/2017 1146   K 4.0 03/18/2013 1739   CL 102 04/01/2017 1146   CL 107 03/18/2013 1739   CO2 28 04/01/2017 1146   CO2 30 03/18/2013 1739   GLUCOSE 123 (H) 04/01/2017 1146   GLUCOSE 79 03/18/2013 1739   BUN 28 (H) 09/25/2017 1643   BUN 18 06/14/2016 1156   BUN 23 (H) 03/18/2013 1739   CREATININE 1.42 (H) 09/25/2017 1643   CREATININE 1.50 (H) 12/18/2013 1008   CALCIUM 9.4 04/01/2017 1146   CALCIUM 9.2 03/18/2013 1739   PROT 6.9 12/31/2017 0855   ALBUMIN 4.6 12/31/2017 0855   AST 20 12/31/2017 0855   ALT 15 12/31/2017 0855   ALKPHOS 43 12/31/2017 0855   BILITOT 0.7 12/31/2017 0855   GFRNONAA 51 (L) 09/25/2017 1643   GFRNONAA 50 (L) 12/18/2013 1008   GFRAA 59 (L) 09/25/2017 1643   GFRAA 58 (L) 12/18/2013 1008   Lab Results  Component Value Date   WBC 12.6 (H) 04/01/2017   HGB 13.6 04/01/2017   HCT 40.5 04/01/2017   MCV 95.0 04/01/2017   PLT 228 04/01/2017    Imaging Studies: No results found.  Assessment and Plan:   Kevin [name] is a 64 y.o. y/o male y/o male with abdominal discomfort  No alarm symptoms present  We discussed the options of proceeding with upper endoscopy at this time versus conservative management with patient and his wife.  Risks and benefits of both options discussed in detail.  They would prefer conservative management at this time and increasing his medication dosage over upper endoscopy.  Increase Protonix to 40 mg daily, 30 minutes before food, and if symptoms not better in 2 to 3 weeks, patient to notify us.  (Risks of PPI use were discussed with patient including bone loss, C. Diff diarrhea, pneumonia, infections, CKD, electrolyte abnormalities.  Pt. Verbalizes understanding and chooses to continue the  medication.)  If symptoms return, consider EGD  No clinical symptoms of recurrent diverticulitis at this time  Obtain H Pylori testing  Dr [name]

## 2019-08-24 ENCOUNTER — Telehealth: Payer: Self-pay

## 2019-08-24 NOTE — Telephone Encounter (Signed)
Called patient and had to leave a voicemail letting him know that he needed to come in or go to his local [place] location and have his blood work done or to come here and have it done. Lab had been added by Dr. Bonna Gains.

## 2019-08-24 NOTE — Telephone Encounter (Signed)
Patient wife calling back wanting to know what labs pt is needing done, so as to not repeat labs. Please call pt wife back at [phone].

## 2019-08-24 NOTE — Telephone Encounter (Signed)
Patient's wife called me back and I was able to let her know that the patient is needing one lab test that had not been ordered before. Mrs. Crall agreed on telling her husband about the lab test and I also provided her with the hours that Juliann Pulse is here at our office if he preferred to come here. She had no further questions.

## 2019-08-24 NOTE — Telephone Encounter (Signed)
-----   Message from Virgel Manifold, MD sent at 08/21/2019 12:38 PM EDT ----- I had discussed H pylori testing with patient during his clinic visit.  I did not see it ordered.  I have ordered it.  Can you please ask him to get this done.  Thank you

## 2019-09-02 ENCOUNTER — Other Ambulatory Visit: Payer: Self-pay | Admitting: Cardiovascular Disease

## 2019-09-02 ENCOUNTER — Ambulatory Visit: Payer: 59 | Admitting: Physician Assistant

## 2019-09-02 MED FILL — CLOPIDOGREL 75 MG TABLET: 75 | 90 days supply | Qty: 90 | Fill #0

## 2019-09-09 ENCOUNTER — Ambulatory Visit: Payer: 59 | Admitting: Nurse Practitioner

## 2019-09-14 ENCOUNTER — Encounter: Payer: Self-pay | Admitting: Family Medicine

## 2019-09-14 ENCOUNTER — Other Ambulatory Visit: Payer: Self-pay | Admitting: Family Medicine

## 2019-09-14 ENCOUNTER — Other Ambulatory Visit: Payer: Self-pay

## 2019-09-14 ENCOUNTER — Other Ambulatory Visit: Payer: Self-pay | Admitting: Gastroenterology

## 2019-09-14 ENCOUNTER — Ambulatory Visit (INDEPENDENT_AMBULATORY_CARE_PROVIDER_SITE_OTHER): Payer: 59 | Admitting: Family Medicine

## 2019-09-14 VITALS — BP 106/64 | HR 70 | Temp 97.5°F | Resp 16 | Ht 65.0 in | Wt 211.8 lb

## 2019-09-14 DIAGNOSIS — I69319 Unspecified symptoms and signs involving cognitive functions following cerebral infarction: Secondary | ICD-10-CM | POA: Diagnosis not present

## 2019-09-14 DIAGNOSIS — Z114 Encounter for screening for human immunodeficiency virus [HIV]: Secondary | ICD-10-CM

## 2019-09-14 DIAGNOSIS — Z23 Encounter for immunization: Secondary | ICD-10-CM

## 2019-09-14 DIAGNOSIS — Z1159 Encounter for screening for other viral diseases: Secondary | ICD-10-CM

## 2019-09-14 DIAGNOSIS — Z0000 Encounter for general adult medical examination without abnormal findings: Secondary | ICD-10-CM

## 2019-09-14 DIAGNOSIS — E782 Mixed hyperlipidemia: Secondary | ICD-10-CM

## 2019-09-14 DIAGNOSIS — F419 Anxiety disorder, unspecified: Secondary | ICD-10-CM

## 2019-09-14 DIAGNOSIS — I25708 Atherosclerosis of coronary artery bypass graft(s), unspecified, with other forms of angina pectoris: Secondary | ICD-10-CM | POA: Diagnosis not present

## 2019-09-14 DIAGNOSIS — G4733 Obstructive sleep apnea (adult) (pediatric): Secondary | ICD-10-CM

## 2019-09-14 DIAGNOSIS — L409 Psoriasis, unspecified: Secondary | ICD-10-CM

## 2019-09-14 DIAGNOSIS — R351 Nocturia: Secondary | ICD-10-CM

## 2019-09-14 MED FILL — RANEXA ER 1,000 MG TABLET: 1000 | 30 days supply | Qty: 60 | Fill #7

## 2019-09-14 MED FILL — ALPRAZolam 0.5 MG TABS: 0.5 | 30 days supply | Qty: 60 | Fill #1

## 2019-09-14 NOTE — Progress Notes (Signed)
Complete physical exam   Patient: Kevin [name]   DOB: 10-28-1955   64 y.o. Male  MRN: [id number] Visit Date: 09/14/2019  Today's healthcare provider: Vernie Murders, PA   Chief Complaint  Patient presents with   Annual Exam   Subjective    Kevin [name] is a 64 y.o. male who presents today for a complete physical exam.  He reports consuming a general and low sodium diet. The patient has a physically strenuous job, but has no regular exercise apart from work.  He generally feels well. He reports sleeping well. He does have additional problems to discuss today.    06/07/2017 Colonoscopy-Diverticulitis  Past Medical History:  Diagnosis Date   Brachial plexopathy    CKD (chronic kidney disease), stage II - III ([place])    Coronary artery disease    a. s/p 4 vessel CABG in 05/2008 w/ LIMA-LAD, SVG-Diag, sequential SVG-OM1/OM2; b. s/p PCI/DES x 2 to LAD in 10/2009; c. LHC 03/2013 stable disease and patent LAD stents; d. 10/2016 Cath: LM min irregs, LAD 20p, 78m ISR, D2 80ost, LCX small, min irregs, OM1/2/3 min irregs, RCA/RPDA/RPAV min irregs, RPL1/2 nl RPL3 min irregs, VG->D2 nl, VG->OM1->OM2 100, EF 55-65%.   H/O maze procedure 05/17/2008   a. 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    Hypertension    Memory deficit after cerebral infarction    OSA (obstructive sleep apnea)    Overweight(278.02)    PAF (paroxysmal atrial fibrillation) ([place])    a. s/p Maze 05/2008, previously on Eliquis->discontinued 05/2016; c. CHADS2VASc => 4 (HTN, stroke x 2, vascular disease)   Patent foramen ovale    a. 05/2008 s/p closure @ time of CABG.   Psoriatic arthritis ([place])    Sleep apnea    Stroke ([place])    a. 07/2005 right brain; b. 05/2005 left brain; c. Resultant memory deficits.   Thrombocytopenia [facility])    Past Surgical History:  Procedure Laterality Date   ACUTE PANCREATITIS  5/11   APPENDECTOMY  1984   CHOLECYSTECTOMY   08/19/2009   COLONOSCOPY WITH PROPOFOL N/A 06/07/2017   Procedure: COLONOSCOPY WITH PROPOFOL;  Surgeon: Virgel Manifold, MD;  Location: [facility] ENDOSCOPY;  Service: Endoscopy;  Laterality: N/A;   CORONARY ARTERY BYPASS GRAFT  05/12/2008   x4 by PeterVan Trigt,MD   LEFT HEART CATH AND CORONARY ANGIOGRAPHY N/A 11/29/2016   Procedure: LEFT HEART CATH AND CORONARY ANGIOGRAPHY;  Surgeon: Wellington Hampshire, MD;  Location: [facility];  Service: Cardiovascular;  Laterality: N/A;   PATENT FORAMEN OVALE CLOSURE     TONSILLECTOMY     AS A CHILD   Social History   Socioeconomic History   Marital status: Married    Spouse name: [name]   Number of children: 3   Years of education: Not on file   Highest education level: Not on file  Occupational History   Occupation: Full time    Employer: [employer]  Tobacco Use   Smoking status: Never Smoker   Smokeless tobacco: Never Used  Vaping Use   Vaping Use: Never used  Substance and Sexual Activity   Alcohol use: Yes   Drug use: No   Sexual activity: Not on file  Other Topics Concern   Not on file  Social History Narrative   Married Health and safety inspector) ,Engineer, 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 Went to high school. He has worked at his present job for 22 years. He has been married for 34 years. They have 3 children. 2 of his daughters live in [place]. He drinks less than two cups of caffeine per day. He does not use  tobacco or recreational drugs. He has rare alcohol intake. Lives with wife and daughter      OSA diagnosed at [facility] , had his  sleep studies there  reviewed them all in detail today he  has retrognathia, sinusitis,  rhinitis      His ESS remains very high  at 16 and FSS at 28 . His falls assessment tool score is 5.   nasonex, refitted mask, the recent  HST failed to document that  apnea is present at all. education about REM BD and EDS, narcolepsy , cataplexy.   45 minutes.    Social Determinants of Health   Financial Resource Strain:    Difficulty of Paying Living Expenses:   Food Insecurity:    Worried About Charity fundraiser in the Last Year:    Arboriculturist in the Last Year:   Transportation Needs:    Film/video editor (Medical):    Lack of Transportation (Non-Medical):   Physical Activity:    Days of Exercise per Week:    Minutes of Exercise per Session:   Stress:    Feeling of Stress :   Social Connections:    Frequency of Communication with Friends and Family:    Frequency of Social Gatherings with Friends and Family:    Attends Religious Services:    Active Member of Clubs or Organizations:    Attends Music therapist:    Marital Status:   Intimate Partner Violence:    Fear of Current or Ex-Partner:    Emotionally Abused:    Physically Abused:    Sexually Abused:    Family Status  Relation Name Status   Sister  Deceased at age 14   Father  Deceased at age 53       [place]   Other AUNT Alive   Mother  Deceased at age 21       [place]  Deceased   Other  (Not Specified)   Annamarie Major  (Not Specified)   PGM  (Not Specified)   PGF  (Not Specified)   Family History  Problem Relation Age of Onset   Diabetes Sister    Ovarian cancer Sister    Psychiatric Illness Father    Heart attack Father    Diabetes Other    Hypertension Mother    Heart disease Mother    Lung cancer Paternal Aunt    Coronary artery disease Other    Diabetes Other    Hypertension Other    Hyperlipidemia Other    Cancer Paternal Uncle    Heart disease Paternal Grandmother    Heart disease Paternal Grandfather    Allergies  Allergen Reactions   Strawberry Extract Anaphylaxis        Dilaudid [Hydromorphone Hcl] Nausea And Vomiting    Patient Care Team: Kammy Klett, Vickki Muff, PA as PCP - General (Family Medicine) Rockey Situ, Kathlene November, MD as PCP - Cardiology (Cardiology) Minna Merritts, MD as  Consulting Physician (Cardiology)   Medications: Outpatient Medications Prior to Visit  Medication Sig   ALPRAZolam (XANAX) 0.5 MG tablet TAKE 1 TO 2 TABLETS BY MOUTH EVERY EVENING  AT BEDTIME   ARTHRITIS PAIN RELIEF 650 MG CR tablet Take 650 mg by mouth every 8 (eight) hours as needed.   aspirin EC 81 MG tablet Take 81 mg by mouth daily.   cetirizine (ZYRTEC) 10 MG tablet Take 10 mg by mouth at bedtime.    clobetasol ointment (TEMOVATE) 4.74 % Apply 1 application topically 2 (two) times daily as needed (for psoriasis).   clopidogrel (PLAVIX) 75 MG tablet TAKE 1 TABLET BY MOUTH ONCE DAILY   desonide (DESOWEN) 0.05 % cream Apply 1 application topically 2 (two) times daily as needed (for psoriasis).   ezetimibe (ZETIA) 10 MG tablet TAKE 1 TABLET BY MOUTH DAILY   isosorbide mononitrate (IMDUR) 30 MG 24 hr tablet TAKE 1 TABLET BY MOUTH ONCE DAILY   LEXAPRO 20 MG tablet TAKE 1 TABLET (20 MG TOTAL) BY MOUTH DAILY.   metoprolol tartrate (LOPRESSOR) 25 MG tablet TAKE 1 TABLET BY MOUTH TWO TIMES DAILY   nitroGLYCERIN (NITROSTAT) 0.4 MG SL tablet Place 1 tablet (0.4 mg total) under the tongue every 5 (five) minutes as needed. May repeat for up to 3 doses.   pantoprazole (PROTONIX) 20 MG tablet TAKE 1 TABLET BY MOUTH ONCE DAILY   RANEXA 1000 MG SR tablet Take 1 tablet (1,000 mg total) by mouth 2 (two) times daily.   rosuvastatin (CRESTOR) 40 MG tablet TAKE 1 TABLET BY MOUTH ONCE DAILY   No facility-administered medications prior to visit.    Review of Systems  Constitutional: Negative.   HENT: Positive for sinus pressure, tinnitus and trouble swallowing.   Eyes: Negative.   Respiratory: Positive for apnea.   Cardiovascular: Negative.   Gastrointestinal: Negative.   Endocrine: Negative.   Genitourinary: Negative.   Musculoskeletal: Positive for arthralgias and joint swelling.  Skin: Negative.   Allergic/Immunologic: Negative.   Neurological: Negative.   Hematological: Bruises/bleeds easily.   Psychiatric/Behavioral: Negative.       Objective    BP 106/64 (BP Location: Left Arm, Patient Position: Sitting, Cuff Size: Normal)   Pulse 70   Temp (!) 97.5 F (36.4 C) (Temporal)   Resp 16   Ht 5\' 5"  (1.651 m)   Wt 211 lb 12.8 oz (96.1 kg)   SpO2 97%   BMI 35.25 kg/m  BP Readings from Last 3 Encounters:  09/14/19 106/64  08/20/19 126/76  05/27/19 111/72   Wt Readings from Last 3 Encounters:  09/14/19 211 lb 12.8 oz (96.1 kg)  08/20/19 209 lb 8 oz (95 kg)  05/27/19 200 lb (90.7 kg)   Physical Exam Constitutional:      Appearance: He is well-developed.  HENT:     Head: Normocephalic and atraumatic.     Right Ear: External ear normal.     Left Ear: External ear normal.     Nose: Nose normal.  Eyes:     General:        Right eye: No discharge.     Conjunctiva/sclera: Conjunctivae normal.     Pupils: Pupils are equal, round, and reactive to light.  Neck:     Thyroid: No thyromegaly.     Trachea: No tracheal deviation.  Cardiovascular:     Rate and Rhythm: Normal rate and regular rhythm.     Heart sounds: Normal heart sounds. No murmur heard. Pulmonary:     Effort: Pulmonary effort is normal. No respiratory distress.     Breath sounds: Normal breath sounds. No wheezing or rales.  Chest:     Chest wall: No tenderness.  Abdominal:     General: There is no distension.     Palpations: Abdomen is soft. There is no mass.     Tenderness: There is no abdominal tenderness. There is no guarding or rebound.  Musculoskeletal:        General: No tenderness. Normal range of motion.     Cervical back: Normal range of motion and neck supple.  Lymphadenopathy:     Cervical: No cervical adenopathy.  Skin:    General: Skin is warm and dry.     Findings: No erythema or rash.  Neurological:     Mental Status: He is alert and oriented to person, place, and time.     Cranial Nerves: No cranial nerve deficit.     Motor: No abnormal muscle tone.     Coordination: Coordination  normal.     Deep Tendon Reflexes: Reflexes are normal and symmetric. Reflexes normal.  Psychiatric:        Behavior: Behavior normal.        Thought Content: Thought content normal.        Judgment: Judgment normal.      Depression Screen  PHQ 2/9 Scores 08/16/2017  PHQ - 2 Score 0    No results found for any visits on 09/14/19.  Assessment & Plan    Routine Health Maintenance and Physical Exam  Exercise Activities and Dietary recommendations Goals   Encouraged to lower weight, reduce fats and exercise 3-4 days a week for 30-40 minutes each time.     Immunization History  Administered Date(s) Administered   Influenza Split 01/13/2009   Influenza,inj,Quad PF,6+ Mos 03/30/2016, 12/31/2017   Influenza-Unspecified 01/24/2019   Tdap 08/27/2012    Health Maintenance  Topic Date Due   Hepatitis C Screening  Never done   COVID-19 Vaccine (1) Never done   HIV Screening  Never done   PNA vac Low Risk Adult (1 of 2 - PCV13) 09/13/2019   INFLUENZA VACCINE  11/01/2019   TETANUS/TDAP  08/28/2022   COLONOSCOPY  06/08/2027    Discussed health benefits of physical activity, and encouraged him to engage in regular exercise appropriate for his age and condition.  1. Annual physical exam General health stable with post CVA memory issues. Counseled regarding health maintenance.  2. Need for hepatitis C screening test - Hepatitis C antibody  3. Screening for HIV (human immunodeficiency virus) - HIV Antibody (routine testing w rflx)  4. Obstructive sleep apnea Sleeping well with CPAP. Beneficial and medically necessary to continue with history of CAD and past CVA.  5. Atherosclerosis of coronary artery bypass graft of native heart with stable angina pectoris ([place]) History of CABG x 4 with angina and stenting of LAD. Known to have PAF and hypertension. Followed regularly by cardiologist (Dr. Rockey Situ). Recheck routine labs. - Comprehensive metabolic panel - Lipid Panel With LDL/HDL  Ratio - CBC with Differential/Platelet - TSH  6. Mixed hyperlipidemia Tolerating Zetia 10 mg qd and Crestor 40 mg qd. Recheck labs and follow up with cardiologist as planned. - Comprehensive metabolic panel - Lipid Panel With LDL/HDL Ratio - CBC with Differential/Platelet - TSH  7. Anxiety Feels anxiety well controlled with Lexapro 20 mg qd and Alprazolam 0.5 mg 1-2 tablets at bedtime. Check follow up labs. - Comprehensive metabolic panel - CBC with Differential/Platelet - TSH  8. Need for vaccination with 13-polyvalent pneumococcal conjugate vaccine - Pneumococcal conjugate vaccine 13-valent IM  9. CVA, old, cognitive deficits Cognitive deficits unchanged since CVA's x 2 in 2007.  Followed by neurologist (Dr. Brett Fairy). - Comprehensive metabolic panel - Lipid Panel With LDL/HDL Ratio - CBC with Differential/Platelet  10. Psoriasis Well controlled with use of Temovate and Desowen.  11. Nocturia Gets up a couple times a night. Will check PSA. - PSA   No follow-ups on file.     I, Devery Murgia, PA-C, have reviewed all documentation for this visit. The documentation on 12/15/20 for the exam, diagnosis, procedures, and orders are all accurate and complete.    Vernie Murders, [place] [phone] (phone) [phone] (fax)  [place]

## 2019-09-15 MED FILL — LEXAPRO 20 MG TABLET: 20 | 90 days supply | Qty: 90 | Fill #0

## 2019-09-15 NOTE — Telephone Encounter (Signed)
Called patient and he was not available but was able to speak with the wife-Kevin [name]. She stated that taking two of the Protonix in the AM had helped tremendously. Therefore, she had requested a refill. However, I told Kevin [name] that according to the last office visit, he would need to call us and let us know. Kevin [name] stated that she knew that she would have to take her husband to get his labs drawn (H Pylori). I told her that this lab will let the physician know if he would be needing antibiotics or continue taking the PPI. Kevin [name] agreed and stated that she would take him soon to the lab.

## 2019-09-22 DIAGNOSIS — R351 Nocturia: Secondary | ICD-10-CM | POA: Diagnosis not present

## 2019-09-22 DIAGNOSIS — I25708 Atherosclerosis of coronary artery bypass graft(s), unspecified, with other forms of angina pectoris: Secondary | ICD-10-CM | POA: Diagnosis not present

## 2019-09-22 DIAGNOSIS — E782 Mixed hyperlipidemia: Secondary | ICD-10-CM | POA: Diagnosis not present

## 2019-09-22 DIAGNOSIS — R109 Unspecified abdominal pain: Secondary | ICD-10-CM | POA: Diagnosis not present

## 2019-09-22 DIAGNOSIS — Z114 Encounter for screening for human immunodeficiency virus [HIV]: Secondary | ICD-10-CM | POA: Diagnosis not present

## 2019-09-22 DIAGNOSIS — I69319 Unspecified symptoms and signs involving cognitive functions following cerebral infarction: Secondary | ICD-10-CM | POA: Diagnosis not present

## 2019-09-22 DIAGNOSIS — Z1159 Encounter for screening for other viral diseases: Secondary | ICD-10-CM | POA: Diagnosis not present

## 2019-09-22 DIAGNOSIS — F419 Anxiety disorder, unspecified: Secondary | ICD-10-CM | POA: Diagnosis not present

## 2019-09-23 ENCOUNTER — Encounter: Payer: Self-pay | Admitting: Physician Assistant

## 2019-09-23 ENCOUNTER — Other Ambulatory Visit: Payer: Self-pay

## 2019-09-23 ENCOUNTER — Ambulatory Visit (INDEPENDENT_AMBULATORY_CARE_PROVIDER_SITE_OTHER): Payer: 59 | Admitting: Physician Assistant

## 2019-09-23 VITALS — BP 120/70 | HR 70 | Ht 66.0 in | Wt 203.1 lb

## 2019-09-23 DIAGNOSIS — Z9889 Other specified postprocedural states: Secondary | ICD-10-CM

## 2019-09-23 DIAGNOSIS — G4733 Obstructive sleep apnea (adult) (pediatric): Secondary | ICD-10-CM | POA: Diagnosis not present

## 2019-09-23 DIAGNOSIS — R413 Other amnesia: Secondary | ICD-10-CM | POA: Diagnosis not present

## 2019-09-23 DIAGNOSIS — I639 Cerebral infarction, unspecified: Secondary | ICD-10-CM | POA: Diagnosis not present

## 2019-09-23 DIAGNOSIS — E785 Hyperlipidemia, unspecified: Secondary | ICD-10-CM

## 2019-09-23 DIAGNOSIS — I25110 Atherosclerotic heart disease of native coronary artery with unstable angina pectoris: Secondary | ICD-10-CM

## 2019-09-23 DIAGNOSIS — I10 Essential (primary) hypertension: Secondary | ICD-10-CM

## 2019-09-23 LAB — CBC WITH DIFFERENTIAL/PLATELET
Basophils Absolute: 0.1 10*3/uL (ref 0.0–0.2)
Basos: 1 %
EOS (ABSOLUTE): 0.4 10*3/uL (ref 0.0–0.4)
Eos: 6 %
Hematocrit: 37.8 % (ref 37.5–51.0)
Hemoglobin: 12.8 g/dL — ABNORMAL LOW (ref 13.0–17.7)
Immature Grans (Abs): 0 10*3/uL (ref 0.0–0.1)
Immature Granulocytes: 0 %
Lymphocytes Absolute: 2 10*3/uL (ref 0.7–3.1)
Lymphs: 29 %
MCH: 31.8 pg (ref 26.6–33.0)
MCHC: 33.9 g/dL (ref 31.5–35.7)
MCV: 94 fL (ref 79–97)
Monocytes Absolute: 0.7 10*3/uL (ref 0.1–0.9)
Monocytes: 11 %
Neutrophils Absolute: 3.7 10*3/uL (ref 1.4–7.0)
Neutrophils: 53 %
Platelets: 171 10*3/uL (ref 150–450)
RBC: 4.02 x10E6/uL — ABNORMAL LOW (ref 4.14–5.80)
RDW: 12.3 % (ref 11.6–15.4)
WBC: 7 10*3/uL (ref 3.4–10.8)

## 2019-09-23 LAB — COMPREHENSIVE METABOLIC PANEL
ALT: 16 IU/L (ref 0–44)
AST: 21 IU/L (ref 0–40)
Albumin/Globulin Ratio: 1.9 (ref 1.2–2.2)
Albumin: 4.6 g/dL (ref 3.8–4.8)
Alkaline Phosphatase: 49 IU/L (ref 48–121)
BUN/Creatinine Ratio: 15 (ref 10–24)
BUN: 22 mg/dL (ref 8–27)
Bilirubin Total: 0.6 mg/dL (ref 0.0–1.2)
CO2: 23 mmol/L (ref 20–29)
Calcium: 9.1 mg/dL (ref 8.6–10.2)
Chloride: 104 mmol/L (ref 96–106)
Creatinine, Ser: 1.42 mg/dL — ABNORMAL HIGH (ref 0.76–1.27)
GFR calc Af Amer: 59 mL/min/{1.73_m2} — ABNORMAL LOW (ref >59)
GFR calc non Af Amer: 51 mL/min/{1.73_m2} — ABNORMAL LOW (ref >59)
Globulin, Total: 2.4 g/dL (ref 1.5–4.5)
Glucose: 112 mg/dL — ABNORMAL HIGH (ref 65–99)
Potassium: 4.1 mmol/L (ref 3.5–5.2)
Sodium: 141 mmol/L (ref 134–144)
Total Protein: 7 g/dL (ref 6.0–8.5)

## 2019-09-23 LAB — PSA: Prostate Specific Ag, Serum: 0.3 ng/mL (ref 0.0–4.0)

## 2019-09-23 LAB — TSH: TSH: 2.82 u[IU]/mL (ref 0.450–4.500)

## 2019-09-23 LAB — HEPATITIS C ANTIBODY: Hep C Virus Ab: <0.1 s/co ratio (ref 0.0–0.9)

## 2019-09-23 LAB — LIPID PANEL WITH LDL/HDL RATIO
Cholesterol, Total: 110 mg/dL (ref 100–199)
HDL: 48 mg/dL (ref >39)
LDL Chol Calc (NIH): 45 mg/dL (ref 0–99)
LDL/HDL Ratio: 0.9 ratio (ref 0.0–3.6)
Triglycerides: 88 mg/dL (ref 0–149)
VLDL Cholesterol Cal: 17 mg/dL (ref 5–40)

## 2019-09-23 LAB — HIV ANTIBODY (ROUTINE TESTING W REFLEX): HIV Screen 4th Generation wRfx: NONREACTIVE

## 2019-09-23 NOTE — Patient Instructions (Signed)
Medication Instructions:   Your physician recommends that you continue on your current medications as directed. Please refer to the Current Medication list given to you today.  *If you need a refill on your cardiac medications before your next appointment, please call your pharmacy*   Lab Work: None Ordered. If you have labs (blood work) drawn today and your tests are completely normal, you will receive your results only by: Marland Kitchen MyChart Message (if you have MyChart) OR . A paper copy in the mail If you have any lab test that is abnormal or we need to change your treatment, we will call you to review the results.   Testing/Procedures: None Ordered.   Follow-Up: At [facility], you and your health needs are our priority.  As part of our continuing mission to provide you with exceptional heart care, we have created designated Provider Care Teams.  These Care Teams include your primary Cardiologist (physician) and Advanced Practice Providers (APPs -  Physician Assistants and Nurse Practitioners) who all work together to provide you with the care you need, when you need it.  We recommend signing up for the patient portal called "MyChart".  Sign up information is provided on this After Visit Summary.  MyChart is used to connect with patients for Virtual Visits (Telemedicine).  Patients are able to view lab/test results, encounter notes, upcoming appointments, etc.  Non-urgent messages can be sent to your provider as well.   To learn more about what you can do with MyChart, go to [email or web address].    Your next appointment:   6 month(s)  The format for your next appointment:   In Person  Provider:    You may see Ida Rogue, MD or one of the following Advanced Practice Providers on your designated Care Team:    Murray Hodgkins, NP  Christell Faith, PA-C  Marrianne Mood, PA-C  Laurann Montana, NP    Other Instructions N/A

## 2019-09-23 NOTE — Progress Notes (Signed)
Office Visit    Patient Name: Kevin [name] Date of Encounter: 09/23/2019  Primary Care Provider:  Margo [name], [place] Primary Cardiologist:  Kevin [name]  Chief Complaint    Chief Complaint  Patient presents with  . office visit    6 month F/U; Meds verbally reviewed with patient's wife Kevin [name].    64 year old male with history of CAD s/p four-vessel bypass and subsequent LAD stenting, paroxysmal atrial fibrillation s/p Maze procedure, PFO s/p closure, stroke with resultant memory deficiencies, hypertension, hyperlipidemia, CKD, and who presents for 50-month follow-up.  Past Medical History    Past Medical History:  Diagnosis Date  . Brachial plexopathy   . CKD (chronic kidney disease), stage II - III (Kevin [name])   . Coronary artery disease    a. s/p 4 vessel CABG in 05/2008 w/ LIMA-LAD, SVG-Diag, sequential SVG-OM1/OM2; b. s/p PCI/DES x 2 to LAD in 10/2009; c. LHC 03/2013 stable disease and patent LAD stents; d. 10/2016 Cath: LM min irregs, LAD 20p, 68m ISR, D2 80ost, LCX small, min irregs, OM1/2/3 min irregs, RCA/RPDA/RPAV min irregs, RPL1/2 nl RPL3 min irregs, VG->D2 nl, VG->OM1->OM2 100, EF 55-65%.  . H/O maze procedure 05/17/2008   a. @ time of CABG  . Hyperlipidemia   . 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   . Hypertension   . Memory deficit after cerebral infarction   . OSA (obstructive sleep apnea)   . Overweight(278.02)   . PAF (paroxysmal atrial fibrillation) ([place])    a. s/p Maze 05/2008, previously on Eliquis->discontinued 05/2016; c. CHADS2VASc => 4 (HTN, stroke x 2, vascular disease)  . Patent foramen ovale    a. 05/2008 s/p closure @ time of CABG.  . Psoriatic arthritis ([place])   . Sleep apnea   . Stroke [facility])    a. 07/2005 right brain; b. 05/2005 left brain; c. Resultant memory deficits.  . Thrombocytopenia ([place])    Past Surgical History:  Procedure Laterality Date  . ACUTE PANCREATITIS  5/11  . APPENDECTOMY  1984  .  CARDIAC CATHETERIZATION    . CHOLECYSTECTOMY  08/19/2009  . COLONOSCOPY WITH PROPOFOL N/A 06/07/2017   Procedure: COLONOSCOPY WITH PROPOFOL;  Surgeon: Kevin Manifold, [name];  Location: [facility] ENDOSCOPY;  Service: Endoscopy;  Laterality: N/A;  . CORONARY ARTERY BYPASS GRAFT  05/12/2008   x4 by Kevin Trigt,[name]  . LEFT HEART CATH AND CORONARY ANGIOGRAPHY N/A 11/29/2016   Procedure: LEFT HEART CATH AND CORONARY ANGIOGRAPHY;  Surgeon: Kevin Hampshire, [name];  Location: [facility];  Service: Cardiovascular;  Laterality: N/A;  . PATENT FORAMEN OVALE CLOSURE    . TONSILLECTOMY     AS A CHILD    Allergies  Allergies  Allergen Reactions  . Strawberry Extract Anaphylaxis       . Dilaudid [Hydromorphone Hcl] Nausea And Vomiting    History of Present Illness    Kevin [name] is a 64 y.o. male with PMH as above.  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  He does not routinely exercise.  He does note some recent weight gain in the setting of COVID-19 and recent sedentary lifestyle/caloric intake.  He is joined today by his wife, who assists him with memory regarding his clinic visits, as well as his medications.  She reportedly works in cardiac rehab.  His cardiac history dates back to February 2010, at which time he underwent CABG x4.  He also underwent maze and PFO closure.  Due to recurrent angina, he required diagnostic catheterization August 2011, revealing an atretic LIMA to the LAD and an occlusion of the sequential graft to OM1 and OM 2.  He underwent PCI and DES to the native LAD.  He has had intermittent chest pain over the years and has been maintained on long-acting nitrate and Ranexa therapy.  He underwent diagnostic cath August 2018 due to recurrent rest and exertional chest discomfort.  This cath showed stable anatomy with patent LAD stents, as well as patency of the vein graft to the diagonal and native RCA.  He has been medically managed since that time.   When last seen in clinic 02/25/2019 by his primary cardiologist, he noted doing well from a cardiac standpoint.  He still reported some below grade dull chest discomfort.  He was unclear if this was a complication from Kevin [name].  Echocardiogram was offered with patient preference to defer at that time.  Chest discomfort was described as radiating from front to back, possibly secondary to his poor posture.  No medication changes were made.  Today, he returns to clinic and notes he is still doing well from a cardiac standpoint.  He denies any further recurrence of his still chest pain.  He continues to remain active with his flooring company and denies a regular exercise routine.  He does note some fatigue towards the end of the day.  He has dependent edema that improves with leg elevation.  He recently increased to 40 mg Protonix for 1 month before dropping down to his usual dose. Some DOE/racing rate with heavy activity, attributed to deconditioning, but no SOB at rest. No s/sx of worsening heart failure. No s/sx of bleeding. Reports medication compliance.   Home Medications    Prior to Admission medications   Medication Sig Start Date End Date Taking? Authorizing Provider  ALPRAZolam (XANAX) 0.5 MG tablet TAKE 1 TO 2 TABLETS BY MOUTH EVERY EVENING AT BEDTIME 08/10/19  Yes Chrismon, Kevin Muff, PA  Apoaequorin (PREVAGEN PO) Take 1 capsule by mouth daily.   Yes Provider, Historical, [name]  ARTHRITIS PAIN RELIEF 650 MG CR tablet Take 650 mg by mouth every 8 (eight) hours as needed. 08/05/19  Yes Provider, Historical, [name]  aspirin EC 81 MG tablet Take 81 mg by mouth daily.   Yes Provider, Historical, [name]  cetirizine (ZYRTEC) 10 MG tablet Take 10 mg by mouth at bedtime.    Yes Provider, Historical, [name]  clobetasol ointment (TEMOVATE) 1.19 % Apply 1 application topically 2 (two) times daily as needed (for psoriasis).   Yes Provider, Historical, [name]  clopidogrel (PLAVIX) 75 MG tablet TAKE 1 TABLET BY MOUTH ONCE DAILY  09/02/19  Yes Gollan, Kevin November, [name]  desonide (DESOWEN) 0.05 % cream Apply 1 application topically 2 (two) times daily as needed (for psoriasis).   Yes Provider, Historical, [name]  ezetimibe (ZETIA) 10 MG tablet TAKE 1 TABLET BY MOUTH DAILY 08/10/19  Yes Gollan, Kevin November, [name]  isosorbide mononitrate (IMDUR) 30 MG 24 hr tablet TAKE 1 TABLET BY MOUTH ONCE DAILY 08/10/19  Yes Gollan, Kevin November, [name]  LEXAPRO 20 MG tablet TAKE 1 TABLET BY MOUTH DAILY. 09/14/19  Yes Birdie Sons, [name]  metoprolol tartrate (LOPRESSOR) 25 MG tablet TAKE 1 TABLET BY MOUTH TWO TIMES DAILY 08/17/19  Yes Gollan, Kevin November, [name]  nitroGLYCERIN (NITROSTAT) 0.4 MG SL tablet Place 1 tablet (0.4 mg total) under the tongue every 5 (five) minutes as needed.  May repeat for up to 3 doses. 04/22/18  Yes Gollan, Kevin November, [name]  pantoprazole (PROTONIX) 20 MG tablet TAKE 1 TABLET BY MOUTH ONCE DAILY 09/15/19  Yes Tahiliani, Lennette Bihari, [name]  RANEXA 1000 MG SR tablet Take 1 tablet (1,000 mg total) by mouth 2 (two) times daily. 12/10/18  Yes Theora Gianotti, NP  rosuvastatin (CRESTOR) 40 MG tablet TAKE 1 TABLET BY MOUTH ONCE DAILY 08/17/19  Yes Gollan, Kevin November, [name]    Review of Systems    He denies further chest pain. No palpitations, pnd, orthopnea, n, v, dizziness, syncope, edema, weight gain, or early satiety. DOE attributed to deconditioning.   All other systems reviewed and are otherwise negative except as noted above.  Physical Exam    VS:  BP 120/70 (BP Location: Left Arm, Patient Position: Sitting, Cuff Size: Large)   Pulse 70   Ht 5\' 6"  (1.676 m)   Wt 203 lb 2 oz (92.1 kg)   SpO2 96%   BMI 32.79 kg/m  , BMI Body mass index is 32.79 kg/m. GEN: Well nourished, well developed, in no acute distress. HEENT: normal. Neck: Supple, no JVD, carotid bruits, or masses. Cardiac: RRR, no murmurs, rubs, or gallops. No clubbing, cyanosis, edema.  Radials/DP/PT 2+ and equal bilaterally.  Respiratory:  Respirations regular and unlabored, clear  to auscultation bilaterally. GI: Soft, nontender, nondistended, BS + x 4. MS: no deformity or atrophy. Skin: warm and dry, no rash. Neuro:  Strength and sensation are intact. Psych: Normal affect.  Accessory Clinical Findings    ECG personally reviewed by me today - NSR, 70bpm, LAD, no acute change from previous - no acute changes.  VITALS Reviewed today   Temp Readings from Last 3 Encounters:  09/14/19 (!) 97.5 F (36.4 C) (Temporal)  08/20/19 98 F (36.7 C) (Oral)  05/27/19 98.8 F (37.1 C) (Oral)   BP Readings from Last 3 Encounters:  09/23/19 120/70  09/14/19 106/64  08/20/19 126/76   Pulse Readings from Last 3 Encounters:  09/23/19 70  09/14/19 70  08/20/19 68    Wt Readings from Last 3 Encounters:  09/23/19 203 lb 2 oz (92.1 kg)  09/14/19 211 lb 12.8 oz (96.1 kg)  08/20/19 209 lb 8 oz (95 kg)     LABS  reviewed today   Lab Results  Component Value Date   WBC 7.0 09/22/2019   HGB 12.8 (L) 09/22/2019   HCT 37.8 09/22/2019   MCV 94 09/22/2019   PLT 171 09/22/2019   Lab Results  Component Value Date   CREATININE 1.42 (H) 09/22/2019   BUN 22 09/22/2019   NA 141 09/22/2019   K 4.1 09/22/2019   CL 104 09/22/2019   CO2 23 09/22/2019   Lab Results  Component Value Date   ALT 16 09/22/2019   AST 21 09/22/2019   ALKPHOS 49 09/22/2019   BILITOT 0.6 09/22/2019   Lab Results  Component Value Date   CHOL 110 09/22/2019   HDL 48 09/22/2019   LDLCALC 45 09/22/2019   TRIG 88 09/22/2019   CHOLHDL 2.2 12/31/2017    Lab Results  Component Value Date   HGBA1C  05/11/2008    5.5 (NOTE)   The ADA recommends the following therapeutic goal for glycemic   control related to Hgb A1C measurement:   Goal of Therapy:   < 7.0% Hgb A1C   Reference: American Diabetes Association: Clinical Practice   Recommendations 2008, Diabetes Care,  2008, 31:(Suppl 1).   Lab Results  Component Value Date   TSH 2.820 09/22/2019     STUDIES/PROCEDURES reviewed today    LHC 05/2016  The left ventricular systolic function is normal.  LV end diastolic pressure is mildly elevated.  The left ventricular ejection fraction is 55-65% by visual estimate.  Mid LAD lesion, 15 %stenosed.  Prox LAD lesion, 20 %stenosed.  Ost 2nd Diag to 2nd Diag lesion, 80 %stenosed.  SVG and is normal in caliber.  The graft exhibits minimal luminal irregularities.  SVG.  Origin lesion, 100 %stenosed.   1. Significant underlying one-vessel coronary artery disease with patent SVG to second diagonal, patent LAD stent with mild in-stent restenosis and known occluded SVG to OM. Native RCA and left circumflex don't have obstructive disease. 2. Normal LV systolic function and mildly elevated left ventricular end-diastolic pressure. Recommendations: Continue medical therapy.  Stress Test 12/2017 Exercise myocardial perfusion imaging study with no significant ischemia Small region of mild fixed perfusion defect distal inferolateral wall, apical inferolateral consistent with previous MI/scar Perfusion defect is less notable on non-attenuation corrected images Normal wall motion, EF estimated at 65% No EKG changes concerning for ischemia at peak stress or in recovery. Target heart rate achieved Low risk scan  Assessment & Plan    CAD --No CP. Discussed previous recommendation for echo if ongoing CP. Given no ongoing CP, will defer. No indication for further ischemic workup. Continue current medications.   Hypertension --Well controlled. Continue current medications.  PAF --Previous maze procedure. In NSR today. Continue current medications.   HLD --Continue current statin and Zetia. LDL controlled.  OSA --Continue CPAP.  History of CVA --Wife present today to assist with recall of A/P.   Arvil Chaco, PA-C 09/23/2019

## 2019-09-24 LAB — H PYLORI, IGM, IGG, IGA AB
H pylori, IgM Abs: <9 units (ref 0.0–8.9)
H. pylori, IgA Abs: <9 units (ref 0.0–8.9)
H. pylori, IgG AbS: 0.26 Index Value (ref 0.00–0.79)

## 2019-10-07 ENCOUNTER — Telehealth: Payer: Self-pay

## 2019-10-07 NOTE — Telephone Encounter (Signed)
Patient's wife-Mrs. Cedillo called wanting to let us know that the patient will be needing a refill on his Pantoprazole 40 MG BID since he last dose is today. Mrs. Stephenson wanted to know what would be his next step. She stated that her husband had not complained of having any symptoms with the Pantoprazole. Therefore, she would like to know if he should continue taking the Pantoprazole, stop taking it or just ahead and schedule an EGD? Please advise.

## 2019-10-08 MED ORDER — PANTOPRAZOLE SODIUM 20 MG PO TBEC: DELAYED_RELEASE_TABLET | ORAL | 1 refills | Status: DC

## 2019-10-08 NOTE — Telephone Encounter (Signed)
Virgel Manifold, MD  You 6 hours ago (12:23 PM)   I would recommend going down on the dose to 40 mg once daily, and proceeding with EGD for Barrett's screening    Called patient's wife-Mrs. Allard and tole her what Dr. Bonna Gains had recommended for her husband to do. She stated that she agreed on ordering an EGD for her husband but unfortunately she did not have coverage at work and she was having a procedure done next week. Therefore, she was going to talk to her husband about and then she would get back to [place] once they spoke about a date that will work for both of them. I told her that I would be sending her a new prescription for her husband's pantoprazole. She had no further questions.

## 2019-10-09 MED FILL — PANTOPRAZOLE SOD DR 20 MG T: 20 | 30 days supply | Qty: 60 | Fill #0

## 2019-10-20 MED FILL — RANEXA ER 1,000 MG TABLET: 1000 | 30 days supply | Qty: 60 | Fill #8

## 2019-10-21 ENCOUNTER — Ambulatory Visit: Payer: 59 | Admitting: Adult Health

## 2019-10-21 DIAGNOSIS — G4733 Obstructive sleep apnea (adult) (pediatric): Secondary | ICD-10-CM | POA: Diagnosis not present

## 2019-11-09 ENCOUNTER — Other Ambulatory Visit: Payer: Self-pay | Admitting: Cardiovascular Disease

## 2019-11-09 MED FILL — PANTOPRAZOLE SOD DR 20 MG T: 20 | 30 days supply | Qty: 60 | Fill #1

## 2019-11-10 MED FILL — EZETIMIBE 10 MG TABS: 10 | 90 days supply | Qty: 90 | Fill #0

## 2019-11-10 MED FILL — ISOSORBIDE MONONITRATE ER 3: 30 | 90 days supply | Qty: 90 | Fill #0

## 2019-11-10 MED FILL — METOPROLOL TARTRATE 25 MG T: 25 | 90 days supply | Qty: 180 | Fill #0

## 2019-11-11 ENCOUNTER — Other Ambulatory Visit: Payer: Self-pay | Admitting: Nurse Practitioner

## 2019-11-11 ENCOUNTER — Other Ambulatory Visit: Payer: Self-pay

## 2019-11-11 MED ORDER — RANEXA 1000 MG PO TB12: 1000.0000 mg | ORAL_TABLET | Freq: Two times a day (BID) | ORAL | 3 refills | Status: DC

## 2019-11-16 MED FILL — ROSUVASTATIN CALCIUM 40 MG: 40 | 90 days supply | Qty: 90 | Fill #0

## 2019-11-16 MED FILL — RANEXA ER 1,000 MG TABLET: 1000 | 30 days supply | Qty: 60 | Fill #0

## 2019-11-18 ENCOUNTER — Telehealth: Payer: Self-pay

## 2019-11-18 NOTE — Telephone Encounter (Signed)
Patient wife called to scheduled patient for a 6 month follow up appointment. Offered patient wife a appointment on 12/01/2019 at 9:15am. Patient wife states she can not get off work in the morning because she has no coverage and she has to take him. Offered  patient another appointment another appointment on 01/26/20 at 2:45pm and 3:15pm. She declined both of them. Scheduled patient for 02/03/2020 at 3:15pm and then wife said he needed to be seen in September informed wife that is all we had available. She stated we needed to send our letters out sooner then.

## 2019-11-20 ENCOUNTER — Other Ambulatory Visit: Payer: 59

## 2019-11-20 ENCOUNTER — Other Ambulatory Visit: Payer: Self-pay | Admitting: Sleep Medicine

## 2019-11-20 DIAGNOSIS — Z20822 Contact with and (suspected) exposure to covid-19: Secondary | ICD-10-CM

## 2019-11-21 LAB — SARS-COV-2, NAA 2 DAY TAT

## 2019-11-21 LAB — NOVEL CORONAVIRUS, NAA: SARS-CoV-2, NAA: NOT DETECTED

## 2019-11-25 ENCOUNTER — Ambulatory Visit (INDEPENDENT_AMBULATORY_CARE_PROVIDER_SITE_OTHER): Payer: 59 | Admitting: Neurology

## 2019-11-25 ENCOUNTER — Other Ambulatory Visit: Payer: Self-pay

## 2019-11-25 ENCOUNTER — Encounter: Payer: Self-pay | Admitting: Neurology

## 2019-11-25 VITALS — BP 118/74 | HR 70 | Ht 66.0 in | Wt 200.0 lb

## 2019-11-25 DIAGNOSIS — G3184 Mild cognitive impairment, so stated: Secondary | ICD-10-CM | POA: Diagnosis not present

## 2019-11-25 DIAGNOSIS — G4730 Sleep apnea, unspecified: Secondary | ICD-10-CM | POA: Diagnosis not present

## 2019-11-25 NOTE — Patient Instructions (Signed)
Mild Neurocognitive Disorder Mild neurocognitive disorder, formerly known as mild cognitive impairment, is a disorder in which memory does not work as well as it should. This disorder may also cause problems with other mental functions, including thought, communication, behavior, and completion of tasks. These problems can be noticed and measured, but they usually do not interfere with daily activities or the ability to live independently. Mild neurocognitive disorder typically develops after 65 years of age, but it can also develop at younger ages. It is not as serious as major neurocognitive disorder, formerly known as dementia, but it may be the first sign of it. Generally, symptoms of this condition get worse over time. In rare cases, symptoms can get better. What are the causes? This condition may be caused by:  Brain disorders like Alzheimer's disease, Parkinson's disease, and other conditions that gradually damage nerve cells (neurodegenerative conditions).  Diseases that affect blood vessels in the brain and result in small strokes.  Certain infections, such as HIV.  Traumatic brain injury.  Other medical conditions, such as brain tumors, underactive thyroid (hypothyroidism), and vitamin B12 deficiency.  Use of certain drugs or prescription medicines. What increases the risk? The following factors may make you more likely to develop this condition:  Being older than 65.  Being male.  Low education level.  Diabetes, high blood pressure, high cholesterol, and other conditions that increase the risk for blood vessel diseases.  Untreated or undertreated sleep apnea.  Having a certain type of gene that can be passed from parent to child (inherited).  Chronic health problems such as heart disease, lung disease, liver disease, kidney disease, or depression. What are the signs or symptoms? Symptoms of this condition include:  Difficulty remembering. You may: ? Forget names, phone  numbers, or details of recent events. ? Forget social events and appointments. ? Repeatedly forget where you put your car keys or other items.  Difficulty thinking and solving problems. You may have trouble with complex tasks, such as: ? Paying bills. ? Driving in unfamiliar places.  Difficulty communicating. You may have trouble: ? Finding the right word or naming an object. ? Forming a sentence that makes sense, or understanding what you read or hear.  Changes in your behavior or personality. When this happens, you may: ? Lose interest in the things that you used to enjoy. ? Withdraw from social situations. ? Get angry more easily than usual. ? Act before thinking. How is this diagnosed? This condition is diagnosed based on:  Your symptoms. Your health care provider may ask you and the people you spend time with, such as family and friends, about your symptoms.  Evaluation of mental functions (neuropsychological testing). Your health care provider may refer you to a neurologist or mental health specialist to evaluate your mental functions in detail. To identify the cause of your condition, your health care provider may:  Get a detailed medical history.  Ask about use of alcohol, drugs, and prescription medicines.  Do a physical exam.  Order blood tests and brain imaging exams. How is this treated? Mild neurocognitive disorder that is caused by medicine use, drug use, infection, or another medical condition may improve when the cause is treated, or when medicines or drugs are stopped. If this disorder has another cause, it generally does not improve and may get worse. In these cases, the goal of treatment is to help you manage the loss of mental function. Treatments in these cases include:  Medicine. Medicine mainly helps memory and   behavior symptoms.  Talk therapy. Talk therapy provides education, emotional support, memory aids, and other ways of making up for problems with  mental function.  Lifestyle changes, including: ? Getting regular exercise. ? Eating a healthy diet that includes omega-3 fatty acids. ? Challenging your thinking and memory skills. ? Having more social interaction. Follow these instructions at home: Eating and drinking   Drink enough fluid to keep your urine pale yellow.  Eat a healthy diet that includes omega-3 fatty acids. These can be found in: ? Fish. ? Nuts. ? Leafy vegetables. ? Vegetable oils.  If you drink alcohol: ? Limit how much you use to:  0-1 drink a day for women.  0-2 drinks a day for men. ? Be aware of how much alcohol is in your drink. In the [place] one drink equals one 12 oz bottle of beer (355 mL), one 5 oz glass of wine (148 mL), or one 1 oz glass of hard liquor (44 mL). Lifestyle   Get regular exercise as told by your health care provider.  Do not use any products that contain nicotine or tobacco, such as cigarettes, e-cigarettes, and chewing tobacco. If you need help quitting, ask your health care provider.  Practice ways to manage stress. If you need help managing stress, ask your health care provider.  Continue to have social interaction.  Keep your mind active with stimulating activities you enjoy, such as reading or playing games.  Make sure to get quality sleep. Follow these tips: ? Avoid napping during the day. ? Keep your sleeping area dark and cool. ? Avoid exercising during the few hours before you go to bed. ? Avoid caffeine products in the evening. General instructions  Take over-the-counter and prescription medicines only as told by your health care provider. Your health care provider may recommend that you avoid taking medicines that can affect thinking, such as pain medicines or sleep medicines.  Work with your health care provider to find out what you need help with and what your safety needs are.  Keep all follow-up visits as told by your health care provider. This is  important. Where to find more information  National Institute on Aging: www.nia.nih.gov Contact a health care provider if:  You have any new symptoms. Get help right away if:  You develop new confusion or your confusion gets worse.  You act in ways that place you or your family in danger. Summary  Mild neurocognitive disorder is a disorder in which memory does not work as well as it should.  Mild neurocognitive disorder can have many causes. It may be the first stage of dementia.  To manage your condition, get regular exercise, keep your mind active, get quality sleep, and eat a healthy diet. This information is not intended to replace advice given to you by your health care provider. Make sure you discuss any questions you have with your health care provider. Document Revised: 10/20/2018 Document Reviewed: 10/20/2018 Elsevier Patient Education  2020 [employer]  

## 2019-11-25 NOTE — Progress Notes (Signed)
PATIENT: Kevin [name] DOB: Mar 22, 1956  REASON FOR VISIT: follow up- cognitive slowing, stroke HISTORY FROM: patient  HISTORY OF PRESENT ILLNESS:    RV on 11-25-2019: meanwhile 64 year-old established [facility] patient , here for a RV with.  He has been followed here for obstructive sleep apnea and has used a [employer] his durable medical equipment company is [employer].  His machine is relatively new he receives it in 2000 18 January 2018.  I will ask him to register his machine through the [employer] website so that with the current recall he can get a new machine as soon as those are available it seems that that may be many months if not a year away. So Clean has been used after  March 2021, probably May. .   In addition I asked him not to use a so clean machine any longer.  He has not seen any black particles in his water chamber and he has not noticed any changes in smell or taste from the air in the machine.  I will ask him to continue using his current machine.  Based on his home sleep test in September 2019 he had a mild his apnea at 14.9 AHI loud snoring no associated hypoxemia and normal heart rate variation.  It is feasible that he would not need CPAP should he have a body mass index of 26 or should he have no other problems with his heart or pulmonary system.  However I assume that his apnea will be just as severe as it was then and for this reason I feel that he is not better off without CPAP for what ever reason.  His current A flex machine has been used for 100% of the days and this is a 90-day download.  He has had an average AHI, apnea hypopnea index, of 2.3/h which is excellent resolution the peak average pressure is 13.6 cmH2O his 90th percentile pressure is 11.8 cmH2O and he is using the machine over 4 hours nightly.   He has a minimum pressure of 5 maximum pressure of 15 and in between this window is also applied on expiratory pressure relief of 2 cmH2O.  Humidifier was currently  on level 5 and the temperature for the tube on grade 3.  His hours of usage have been very regular and very highly compliant and he has had the same breathing volume, has committed to a same temperature for almost 2 years.  Epworth Sleepiness score now 12 points. FSS: N/A.     Interval history : 64 year old male Caucasian patient who is now no longer on Eliquis for atrial fibrillation but Plavix as an antiplatelet agent.  He had no recent angina concerns, he is usually a compliant CPAP user but over the early months of February contracted influenza and had to wait for 2 weeks to be able to tolerate positive airway pressure again.  He has resumed the use of the machine now there is an autotitrator between 5 and 15 cmH2O was 2 cm EPR, a ramp time of 10 minutes and his average pressure 90% of the time is 9.5 cm water.   When Kevin [name] is using his machine he uses it for about 8 hours and 30 minutes.  However given the recent respiratory infect he has only had a compliance for 23% for February 2020.  He had used it during travels to [facility] in January 2020, he went for a business convention. He owns  a flooring shop. 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 His spouse feels that his performance is limited and work routines are too Film/video editor for him.   Kevin [name] is a 64 year old male patient with a history of memory disturbance following multiple embolic strokes in 6789. A fibrillation history- but Eliquis was d/c 05/2016.   He reports he has significant daytime sleepiness and has noticed being more tired throughout the day.  He states when he was diagnosed with sleep apnea he lost weight and after he was retested by HST was told he did not have apnea anymore ( [facility] sleep [facility] at [facility] - OSA, retrognathia, sinusitis, rhinitis). His Epworth  SS endorsed in 2017 at 16/24 and FSS at 52/63 points, both very high. HST did not confirm apnea to be any longer present. He states that since that time he has gained weight and has begun snoring again. He also reports tinnitus in the right ear, present since his stroke.   BMI: 33.1  STUDY RESULTS HST :  Total Recording Time: 8 hours 50 minutes, valid for 7 h 33 min.  Total Apnea/Hypopnea Index (AHI): 14.9 /h; RDI: 15.8 /h. suggested REM AHI was 21.8/h Average Oxygen Saturation:  94 %, Lowest Oxygen Saturation: 88 %  Total Time in Oxygen Saturation below 89 %: 0.2 minutes.  Heart Rate:  between 46 and 70 bpm.  IMPRESSION: Mild Obstructive sleep apnea confirmed at AHI 14.9/h. REM AHI of 21.8/h with moderate loud snoring (see RDI). No associated hypoxemia, normal heart rate variation.   RECOMMENDATION: I would restart CPAP (I can order a new machine for him) and urge him to lose weight again.  This is a milder apnea form but even mild apnea left untreated can cause irregular heartbeats, meaning a return to uncontrolled atrial fibrillation.  I certify that I have reviewed the raw data recording prior to the issuance of this report in accordance with the standards of the American Academy of Sleep Medicine (AASM). Larey Seat, [name]  05-20-2017       Kevin [name] is seen here for a yearly follow up-  Today 11/14/2016, his chief complaint in the past has been cognitive slowing rather than forgetfulness. His wife also feels that he has become more and more forgetful but he does very well on our standard office neuropsychology tests. He scored 30 out of 30 on a Mini-Mental Status Examination by Kevin [name], his [place] cognitive assessment test documented 28 out of 30 points. He cannot remember if he ate or spoke to a customer ,  difficulties with beginning and finishing projects- all beginning with his strokes in 2007.   Kevin [name] is a 64 year old male with a history of stroke and cognitive slowing. He returns  today for follow-up. The patient feels that he has remained stable. He continues to notice trouble with his memory although he feels it is not any worse. 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 He remains on aspirin. His primary care and cardiologist managing his blood pressure and cholesterol. He is not diabetic. He does not smoke. He denies any new neurological symptoms. He returns today for an evaluation.  HISTORY Kevin [name]  Is a 64 year old male with a history of strokes and residual cognitive slowing. He returns today for follow-up. The patient feels that  his cognition has remained the same. He does not feel that things have gotten worse. 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 He is followed by a cardiologist who manages his blood pressure and cholesterol. He denies any additional strokelike symptoms. He also states that his fatigue has improved over the years. He states that he has learned how to manage it better. The patient continues to take aspirin as secondary stroke prevention.  Patient states that his short-term memory is most affected. However he states that he can normally recall things if prompted. He denies any new medical issues. He returns today for an evaluation.  HISTORY 09/24/13: Kevin [name] is a 64 y.o. male here as a follow up visit, after last year's referral for transition of care from Dr. Erling Cruz. HST in 2013 negative for OSA, Epworth score is 18, FSS 43 points, hypersomnia persistent. 2007 two strokes , in March affecting the left and in May 2007 affecting right hemisphere. He has typical right brain deficits based on his neuropsychological testing. 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 His spouse feels that his performance is limited and work routines are too Film/video editor for him.  He needs to re check his own work, all this takes extra time, he cannot multitask.  Last visit note: Patient was originally referred for hypersomnia by Dr Love12-20-13, afterhe reported being noncompliant with CPAP. At the time, I ordered a home sleep test which returned as an AHI of less than 5 and basically would not qualify the patient for CPAP use or doesn't indicate the need of CPAP. Kevin [name] has a past medical history however him admitted and made understandable why apnea was considered for him to be a diagnosis he had a right brain stroke in May 2007, but it of coronary artery disease and atrial fibrillation in the past, hypertension, hyperlipidemia, psoriasis, obstructive sleep apnea was diagnosed in 2012 and at the time he was titrated to 7 cm water pressure at this was done at [facility]. Again meanwhile his apnea index is less than 5 and therefore not longer in need of CPAP treatment. Would have been present isn't elevated or excessive daytime sleepiness with Epworth score is 17/24 at the time of his home sleep test as ordered by Dr. Erling Cruz, and also today he again and [name] an Epworth sleepiness score of 13 point less than in last December but still slightly elevated.Goes to bed at 10:30 PM, falls asleep very quickly and arises in the morning at 7:30. He may have one or none bathroom break. And intermittent slowing only the patient does not snore himself awake.The patient reports that if he has a chance to take a nap he will and it'll take between 2-1/2 hours. He seems to be at his sleep he is between 11 AM and 2 was 3 PM. The patient also falls regularly asleep in front of the TV at night at about 6:30 PM . He states that he cannot stay awake until the news.I was able today to review all sleep study if the whole sleep test, the previous polysomnography from 11-24-10 and CPAP interpretation from 9-22,012. Dr. Morene Antu is the referring physician for all 3 studies. The  patient was no able to lose weight , was 170 pounds in 2012 now 188 lbs.  His past medical history is summarized here in short:  Patient suffered a left brain stroke in March 2007 at age 61, another stroke in the right posterior cerebral artery on 08-21-2005. 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 He underwent a coronary artery disease 4 vessel bypass surgery and patch closure by Dr. Nils Pyle on 2-15 2010. At the time he had complained of intermittent diplopia as a residual from his stroke symptoms, he also had cognitive issues. A neuropsychological battery testing in September 2010 showed that his cognitive abilities were well in the average range and his executive function was intact. But he had deficits for complex visual attention, shifting attention and multitasking multiple stimuli applied simultaneously did result in confusion. His memory findings were typical of of right brain dysfunction . The patient is a Armed forces operational officer ( flooring) and noted that he becomes more fatigued and probably less attentive with the ongoing day at work.  Toward the end of the day he would often develop a tinnitus which he feels distracts him . He feels that: Naps in the morning or early afternoon can help him to sustain productivity through the day, he feels less fatigued less distractible and more able to concentrate.  REVIEW OF SYSTEMS: Out of a complete 14 system review of symptoms, the patient complains only of the following symptoms, and all other reviewed systems are negative.  Subjective cognitive decline.   How likely are you to doze in the following situations: 0 = not likely, 1 = slight chance, 2 = moderate chance, 3 = high chance  Sitting and Reading? Watching Television? Sitting inactive in a public place (theater or meeting)? Lying down in the afternoon when circumstances permit? Sitting and talking to  someone? Sitting quietly after lunch without alcohol? In a car, while stopped for a few minutes in traffic? As a passenger in a car for an hour without a break?  Total = 13/24     ALLERGIES: Allergies  Allergen Reactions  . Strawberry Extract Anaphylaxis       . Dilaudid [Hydromorphone Hcl] Nausea And Vomiting    HOME MEDICATIONS: Outpatient Medications Prior to Visit  Medication Sig Dispense Refill  . ALPRAZolam (XANAX) 0.5 MG tablet TAKE 1 TO 2 TABLETS BY MOUTH EVERY EVENING AT BEDTIME 60 tablet 1  . Apoaequorin (PREVAGEN PO) Take 1 capsule by mouth daily.    . ARTHRITIS PAIN RELIEF 650 MG CR tablet Take 650 mg by mouth every 8 (eight) hours as needed.    Marland Kitchen aspirin EC 81 MG tablet Take 81 mg by mouth daily.    . cetirizine (ZYRTEC) 10 MG tablet Take 10 mg by mouth at bedtime.     . clobetasol ointment (TEMOVATE) 9.62 % Apply 1 application topically 2 (two) times daily as needed (for psoriasis).    . clopidogrel (PLAVIX) 75 MG tablet TAKE 1 TABLET BY MOUTH ONCE DAILY 90 tablet 3  . desonide (DESOWEN) 0.05 % cream Apply 1 application topically 2 (two) times daily as needed (for psoriasis).    . ezetimibe (ZETIA) 10 MG tablet TAKE 1 TABLET BY MOUTH ONCE DAILY 90 tablet 0  . isosorbide mononitrate (IMDUR) 30 MG 24 hr tablet TAKE 1 TABLET BY MOUTH ONCE DAILY 90 tablet 0  . LEXAPRO 20 MG tablet TAKE 1 TABLET BY MOUTH DAILY. 90 tablet 1  . metoprolol tartrate (LOPRESSOR) 25 MG tablet TAKE 1 TABLET BY MOUTH TWO TIMES  DAILY 180 tablet 0  . nitroGLYCERIN (NITROSTAT) 0.4 MG SL tablet Place 1 tablet (0.4 mg total) under the tongue every 5 (five) minutes as needed. May repeat for up to 3 doses. 25 tablet 1  . pantoprazole (PROTONIX) 20 MG tablet Take 2 tablets daily. 60 tablet 1  . RANEXA 1000 MG SR tablet Take 1 tablet (1,000 mg total) by mouth 2 (two) times daily. 180 tablet 3  . rosuvastatin (CRESTOR) 40 MG tablet TAKE 1 TABLET BY MOUTH ONCE DAILY 90 tablet 0   No facility-administered  medications prior to visit.    PAST MEDICAL HISTORY: Past Medical History:  Diagnosis Date  . Brachial plexopathy   . CKD (chronic kidney disease), stage II - III ([place])   . Coronary artery disease    a. s/p 4 vessel CABG in 05/2008 w/ LIMA-LAD, SVG-Diag, sequential SVG-OM1/OM2; b. s/p PCI/DES x 2 to LAD in 10/2009; c. LHC 03/2013 stable disease and patent LAD stents; d. 10/2016 Cath: LM min irregs, LAD 20p, 53m ISR, D2 80ost, LCX small, min irregs, OM1/2/3 min irregs, RCA/RPDA/RPAV min irregs, RPL1/2 nl RPL3 min irregs, VG->D2 nl, VG->OM1->OM2 100, EF 55-65%.  . H/O maze procedure 05/17/2008   a. @ time of CABG  . Hyperlipidemia   . 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   . Hypertension   . Memory deficit after cerebral infarction   . OSA (obstructive sleep apnea)   . Overweight(278.02)   . PAF (paroxysmal atrial fibrillation) ([place])    a. s/p Maze 05/2008, previously on Eliquis->discontinued 05/2016; c. CHADS2VASc => 4 (HTN, stroke x 2, vascular disease)  . Patent foramen ovale    a. 05/2008 s/p closure @ time of CABG.  . Psoriatic arthritis ([place])   . Sleep apnea   . Stroke [facility])    a. 07/2005 right brain; b. 05/2005 left brain; c. Resultant memory deficits.  . Thrombocytopenia ([place])     PAST SURGICAL HISTORY: Past Surgical History:  Procedure Laterality Date  . ACUTE PANCREATITIS  5/11  . APPENDECTOMY  1984  . CARDIAC CATHETERIZATION    . CHOLECYSTECTOMY  08/19/2009  . COLONOSCOPY WITH PROPOFOL N/A 06/07/2017   Procedure: COLONOSCOPY WITH PROPOFOL;  Surgeon: Virgel Manifold, MD;  Location: [facility] ENDOSCOPY;  Service: Endoscopy;  Laterality: N/A;  . CORONARY ARTERY BYPASS GRAFT  05/12/2008   x4 by PeterVan Trigt,MD  . LEFT HEART CATH AND CORONARY ANGIOGRAPHY N/A 11/29/2016   Procedure: LEFT HEART CATH AND CORONARY ANGIOGRAPHY;  Surgeon: Wellington Hampshire, MD;  Location: [place] [facility];  Service: Cardiovascular;  Laterality: N/A;  . PATENT  FORAMEN OVALE CLOSURE    . TONSILLECTOMY     AS A CHILD    FAMILY HISTORY: Family History  Problem Relation Age of Onset  . Diabetes Sister   . Ovarian cancer Sister   . Psychiatric Illness Father   . Heart attack Father   . Diabetes Other   . Hypertension Mother   . Heart disease Mother   . Lung cancer Paternal Aunt   . Coronary artery disease Other   . Diabetes Other   . Hypertension Other   . Hyperlipidemia Other   . Cancer Paternal Uncle   . Heart disease Paternal Grandmother   . Heart disease Paternal Grandfather     SOCIAL HISTORY: Social History   Socioeconomic History  . Marital status: Married    Spouse name: [name]  . Number of children: 3  .  Years of education: Not on file  . Highest education level: Not on file  Occupational History  . Occupation: Full time    Employer: [employer]  Tobacco Use  . Smoking status: Never Smoker  . Smokeless tobacco: Never Used  Vaping Use  . Vaping Use: Never used  Substance and Sexual Activity  . Alcohol use: Yes    Comment: occassionally  . Drug use: No  . Sexual activity: Not on file  Other Topics Concern  . 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 Went to high school. He has worked at his present job for 22 years. He has been married for 34 years. They have 3 children. 2 of his daughters live in [place]. He drinks less than two cups of caffeine per day. He does not use  tobacco or recreational drugs. He has rare alcohol intake. Lives with wife and daughter      OSA diagnosed at [facility] , had his  sleep studies there  reviewed them all in detail today he  has retrognathia, sinusitis,  rhinitis      His ESS remains very high  at 16 and FSS at 56 . His falls assessment tool score is 5.   nasonex, refitted mask, the recent  HST failed to document that  apnea is present at all.  education about REM BD and EDS, narcolepsy , cataplexy.  45 minutes.    Social Determinants of Health   Financial Resource Strain:   . Difficulty of Paying Living Expenses: Not on file  Food Insecurity:   . Worried About Charity fundraiser in the Last Year: Not on file  . Ran Out of Food in the Last Year: Not on file  Transportation Needs:   . Lack of Transportation (Medical): Not on file  . Lack of Transportation (Non-Medical): Not on file  Physical Activity:   . Days of Exercise per Week: Not on file  . Minutes of Exercise per Session: Not on file  Stress:   . Feeling of Stress : Not on file  Social Connections:   . Frequency of Communication with Friends and Family: Not on file  . Frequency of Social Gatherings with Friends and Family: Not on file  . Attends Religious Services: Not on file  . Active Member of Clubs or Organizations: Not on file  . Attends Archivist Meetings: Not on file  . Marital Status: Not on file  Intimate Partner Violence:   . Fear of Current or Ex-Partner: Not on file  . Emotionally Abused: Not on file  . Physically Abused: Not on file  . Sexually Abused: Not on file    PHYSICAL EXAM  There were no vitals filed for this visit. There is no height or weight on file to calculate BMI.  MMSE - Mini Mental State Exam 11/14/2016 11/03/2015  Orientation to time 5 4  Orientation to Place 5 5  Registration 3 3  Attention/ Calculation 5 5  Recall 3 2  Language- name 2 objects 2 2  Language- repeat 1 1  Language- follow 3 step command 3 3  Language- read & follow direction 1 1  Write a sentence 1 1  Copy design 1 1  Total score 30 28     [name] the Mini-Mental Status Examination today on 25 November 2019 was 28 out of 30 points he  provided 14 animals, had no trouble with clock drawing or image reconstruction the serial 7 reached four-point pound he remembered one of the 3 words without prompting and to others with a simple prompts by  category, he is fully oriented to time place person date.   Generalized: Well developed, in no acute distress  Neck circumference : 16 "   Neurological examination   Mentation: Alert oriented to time, place, history taking. Follows all commands speech and language fluent. Mild dysphonia.  He reports memory loss progression . Montreal Cognitive Assessment  11/21/2017 11/14/2016  Visuospatial/ Executive (0/5) 5 5  Naming (0/3) 3 3  Attention: Read list of digits (0/2) 2 2  Attention: Read list of letters (0/1) 0 1  Attention: Serial 7 subtraction starting at 100 (0/3) 2 3  Language: Repeat phrase (0/2) 2 1  Language : Fluency (0/1) 1 1  Abstraction (0/2) 2 2  Delayed Recall (0/5) 3 4  Orientation (0/6) 6 6  Total 26 28    Cranial nerve: no loss of smell or taste. Pupils were equal round reactive to light. Uvula/  tongue midline. Tinnitus- " in my right head "- " its not in my ear, it's in the head. "  Hearing the tuning fork vibration decreased on the right, versus left.  Head turning and shoulder shrug were normal and symmetric. Motor: right sided tone elevated, mildly, no pronation, no drop.   Sensory: Sensory testing is intact to soft touch on all 4 extremities. Coordination: finger-nose maneuver performed  bilaterally, with target tremor.  Action tremor.  Reflexes: Deep tendon reflexes are symmetric, brisk patella reflex- right over left.  DIAGNOSTIC DATA (LABS, IMAGING, TESTING) - I reviewed patient records, labs, notes, testing and imaging myself where available.  CPAP download reviewed.  Excellent 90 day download, discussed recall, encouraged to continue using dream station for now. D/c  So clean which he started around easter .    ASSESSMENT AND PLAN 64 y.o. year old male  has a past medical history of Brachial plexopathy, CKD (chronic kidney disease), stage II - III ([place]), Coronary artery disease, H/O maze procedure (05/17/2008), Hyperlipidemia, Hypersomnia, organic  (09/24/2012), Hypertension, Memory deficit after cerebral infarction, OSA (obstructive sleep apnea), Overweight(278.02), PAF (paroxysmal atrial fibrillation) ([place]), Patent foramen ovale, Psoriatic arthritis ([place]), Sleep apnea, Stroke ([place]), and Thrombocytopenia ([place]). here as a stroke patient of Dr Bernardo Heater , later  Dr. Clydene Fake with:  1. Strokes in 2007(followed by Dr Erling Cruz until 2013) with residual right brain syndrome.  A) Cognitive slowing-  Related to strokes in onset . He feels there is  progression. MMSE 28/ 30. Lets do [place] next time.  B)  Sleep - he is active , but not enactment of dreams.  Right arm myoclonus, twitches, jerking.   while apnea free in 2016 he now has tested positive again -2019  on CPAP.    Highly compliant. Needs to register his DREAMSTATION>   Continue on Plavix  for stroke prevention.   The patient's memory score was not obtained today.  I will ask my RN to perform [facility] testing .  We will continue to monitor yearly- MOCA.   Larey Seat, MD    11/25/2019, 3:14 PM [facility] [address], [place], [place] [address] [phone]

## 2019-12-17 ENCOUNTER — Other Ambulatory Visit: Payer: Self-pay | Admitting: Family Medicine

## 2019-12-17 DIAGNOSIS — F419 Anxiety disorder, unspecified: Secondary | ICD-10-CM

## 2019-12-17 MED FILL — PANTOPRAZOLE SOD DR 20 MG T: 20 | 90 days supply | Qty: 90 | Fill #0

## 2019-12-17 MED FILL — RANEXA ER 1,000 MG TABLET: 1000 | 30 days supply | Qty: 60 | Fill #1

## 2019-12-17 MED FILL — CLOPIDOGREL 75 MG TABLET: 75 | 90 days supply | Qty: 90 | Fill #1

## 2019-12-17 NOTE — Telephone Encounter (Signed)
Requested medication (s) are due for refill today: yes  Requested medication (s) are on the active medication list: yes  Last refill: 08/10/19  #60  1 refill  Future visit scheduled  no  Notes to clinic:not delegated  Requested Prescriptions  Pending Prescriptions Disp Refills   ALPRAZolam (XANAX) 0.5 MG tablet [Pharmacy Med Name: ALPRAZolam 0.5 MG TABS 0.5 Tablet] 60 tablet 1    Sig: TAKE 1 - 2 TABLETS BY MOUTH EVERY EVENING AT BEDTIME      Not Delegated - Psychiatry:  Anxiolytics/Hypnotics Failed - 12/17/2019  4:50 PM      Failed - This refill cannot be delegated      Failed - Urine Drug Screen completed in last 360 days.      Passed - Valid encounter within last 6 months    Recent Outpatient Visits           3 months ago Annual physical exam   [place], [place]   7 months ago Dysfunction of left eustachian tube   [place], Vickki Muff, [place]   1 year ago [place], [place]   1 year ago Subacute maxillary sinusitis   [place], [place]   1 year ago Sore throat   [facility] Trinna Post, PA-C       Future Appointments             In 1 month Tahiliani, Lennette Bihari, MD [place]   In 3 months Gollan, Kathlene November, MD [facility], [place]

## 2019-12-18 MED FILL — ALPRAZolam 0.5 MG TABS: 0.5 | 30 days supply | Qty: 60 | Fill #0

## 2019-12-18 NOTE — Telephone Encounter (Signed)
Please advise 

## 2019-12-24 DIAGNOSIS — G4733 Obstructive sleep apnea (adult) (pediatric): Secondary | ICD-10-CM | POA: Diagnosis not present

## 2019-12-25 ENCOUNTER — Telehealth: Payer: Self-pay

## 2019-12-25 MED FILL — LEXAPRO 20 MG TABLET: 20 | 90 days supply | Qty: 90 | Fill #1

## 2019-12-25 NOTE — Telephone Encounter (Signed)
[facility] is calling she has faxed PA for lexapro again

## 2019-12-25 NOTE — Telephone Encounter (Signed)
Copied from [place] [id number]. Topic: General - Other >> Dec 25, 2019  9:35 AM Rainey Pines A wrote: Patient wife would like a callback as soon as possible in regards to pharmacy sending over prior authorization for lexapro on 9/16 and havent heard anything back. Patients wife stated that she had to pay over $300 for medication due to prior authorization not being done. She wants to know if this can be back dated and taken care of today. Please Arnoldo Hooker

## 2019-12-25 NOTE — Telephone Encounter (Signed)
Spoke with wife and let her know that we had not received request for authorization.  She will have them send again.  Needs to be done so she can get the meds for Monday. Wife will call back to see that we received request.

## 2019-12-28 ENCOUNTER — Telehealth: Payer: Self-pay

## 2019-12-28 NOTE — Telephone Encounter (Signed)
Forms have been given to Ruth to complete

## 2019-12-28 NOTE — Telephone Encounter (Signed)
Copied from [place] [id number]. Topic: General - Inquiry >> Dec 25, 2019  1:35 PM Gillis Ends D wrote: Reason for CRM: Received a call from [facility] and she stated that they received the Prior Auth and that nothing else is needed. He's good to go! Please advise

## 2019-12-29 NOTE — Telephone Encounter (Signed)
Good to go to pharmacy to pick up Lexapro prescription if PA received.

## 2020-01-19 MED FILL — RANEXA ER 1,000 MG TABLET: 1000 | 30 days supply | Qty: 60 | Fill #2

## 2020-01-29 ENCOUNTER — Other Ambulatory Visit: Payer: Self-pay

## 2020-01-29 ENCOUNTER — Other Ambulatory Visit: Payer: Self-pay | Admitting: Cardiovascular Disease

## 2020-01-29 MED FILL — EZETIMIBE 10 MG TABS: 10 | 90 days supply | Qty: 90 | Fill #0

## 2020-02-03 ENCOUNTER — Encounter: Payer: Self-pay | Admitting: Gastroenterology

## 2020-02-03 ENCOUNTER — Other Ambulatory Visit: Payer: Self-pay

## 2020-02-03 ENCOUNTER — Ambulatory Visit (INDEPENDENT_AMBULATORY_CARE_PROVIDER_SITE_OTHER): Payer: 59 | Admitting: Gastroenterology

## 2020-02-03 ENCOUNTER — Other Ambulatory Visit: Payer: Self-pay | Admitting: Internal Medicine

## 2020-02-03 VITALS — BP 113/66 | HR 65 | Ht 66.0 in | Wt 201.0 lb

## 2020-02-03 DIAGNOSIS — K219 Gastro-esophageal reflux disease without esophagitis: Secondary | ICD-10-CM

## 2020-02-03 NOTE — Progress Notes (Signed)
Kevin Antigua, MD [address]  [place]  [place], [place] [address]  Main: [phone]  Fax: [phone]   Primary Care Physician: Kevin [name], [place]   Chief Complaint  Patient presents with  . Follow-up    Abdominal discomfort    HPI: Kevin [name] is a 64 y.o. male here for follow-up of abdominal discomfort and reflux.  Patient symptoms have completely resolved.  He is taking Protonix once daily.  On his last visit we had prescribed Protonix 20 mg, 2 pills daily, for a total of 40 mg every morning.  However, he is only taking 1 pill daily.  He is unsure if it is a 40 mg dose or 20 mg.  But either way, he is completely asymptomatic.    Current Outpatient Medications  Medication Sig Dispense Refill  . ALPRAZolam (XANAX) 0.5 MG tablet TAKE 1 - 2 TABLETS BY MOUTH EVERY EVENING AT BEDTIME 60 tablet 1  . Apoaequorin (PREVAGEN PO) Take 1 capsule by mouth daily.    . ARTHRITIS PAIN RELIEF 650 MG CR tablet Take 650 mg by mouth every 8 (eight) hours as needed.    Marland Kitchen aspirin EC 81 MG tablet Take 81 mg by mouth daily.    . cetirizine (ZYRTEC) 10 MG tablet Take 10 mg by mouth at bedtime.     . clobetasol ointment (TEMOVATE) 1.02 % Apply 1 application topically 2 (two) times daily as needed (for psoriasis).    . clopidogrel (PLAVIX) 75 MG tablet TAKE 1 TABLET BY MOUTH ONCE DAILY 90 tablet 3  . desonide (DESOWEN) 0.05 % cream Apply 1 application topically 2 (two) times daily as needed (for psoriasis).    . ezetimibe (ZETIA) 10 MG tablet TAKE 1 TABLET BY MOUTH ONCE DAILY 90 tablet 0  . isosorbide dinitrate (ISORDIL) 30 MG tablet Take 1 tablet by mouth daily.    . isosorbide mononitrate (IMDUR) 30 MG 24 hr tablet TAKE 1 TABLET BY MOUTH ONCE DAILY 90 tablet 0  . LEXAPRO 20 MG tablet TAKE 1 TABLET BY MOUTH DAILY. 90 tablet 1  . metoprolol tartrate (LOPRESSOR) 25 MG tablet TAKE 1 TABLET BY MOUTH TWO TIMES DAILY 180 tablet 0  . nitroGLYCERIN (NITROSTAT) 0.4 MG SL tablet Place 1  tablet (0.4 mg total) under the tongue every 5 (five) minutes as needed. May repeat for up to 3 doses. 25 tablet 1  . pantoprazole (PROTONIX) 20 MG tablet Take 2 tablets daily. 60 tablet 1  . RANEXA 1000 MG SR tablet Take 1 tablet (1,000 mg total) by mouth 2 (two) times daily. 180 tablet 3  . rosuvastatin (CRESTOR) 40 MG tablet TAKE 1 TABLET BY MOUTH ONCE DAILY 90 tablet 0   No current facility-administered medications for this visit.    Allergies as of 02/03/2020 - Review Complete 02/03/2020  Allergen Reaction Noted  . Strawberry extract Anaphylaxis 12/04/2011  . Dilaudid [hydromorphone hcl] Nausea And Vomiting 06/21/2010    ROS:  General: Negative for anorexia, weight loss, fever, chills, fatigue, weakness. ENT: Negative for hoarseness, difficulty swallowing , nasal congestion. CV: Negative for chest pain, angina, palpitations, dyspnea on exertion, peripheral edema.  Respiratory: Negative for dyspnea at rest, dyspnea on exertion, cough, sputum, wheezing.  GI: See history of present illness. GU:  Negative for dysuria, hematuria, urinary incontinence, urinary frequency, nocturnal urination.  Endo: Negative for unusual weight change.    Physical Examination:   BP 113/66 (BP Location: Right Arm, Patient Position: Sitting, Cuff Size: Large)  Pulse 65   Ht 5\' 6"  (1.676 m)   Wt 201 lb (91.2 kg)   BMI 32.44 kg/m   General: Well-nourished, well-developed in no acute distress.  Eyes: No icterus. Conjunctivae pink. Mouth: Oropharyngeal mucosa moist and pink , no lesions erythema or exudate. Neck: Supple, Trachea midline Abdomen: Bowel sounds are normal, nontender, nondistended, no hepatosplenomegaly or masses, no abdominal bruits or hernia , no rebound or guarding.   Extremities: No lower extremity edema. No clubbing or deformities. Neuro: Alert and oriented x 3.  Grossly intact. Skin: Warm and dry, no jaundice.   Psych: Alert and cooperative, normal mood and  affect.   Labs: Normal liver enzymes recently  Imaging Studies: No results found.  Assessment and Plan:   Kevin [name] is a 64 y.o. y/o male here for follow-up of abdominal discomfort and reflux  Patient is agreeable to now decreasing his PPI  He will call us after going home and checking the dose of Protonix that he is taking If it is 20 mg daily that he is taking, I have asked him to completely discontinue the medication  If it is 40 mg, we will need to send prescription for 20 mg for another month and then discontinue as patient is hesitant about making sudden medication changes all at once given his abdominal discomfort on last visit that is now relieved  If symptoms return, he is willing to consider EGD  We did discuss indications for EGD at this time, however, they prefer to wait to see if symptoms return and then proceed with EGD accordingly  (Risks of PPI use were discussed with patient including bone loss, C. Diff diarrhea, pneumonia, infections, CKD, electrolyte abnormalities.  Pt. Verbalizes understanding and chooses to continue the medication.)   Dr Kevin [name]

## 2020-02-06 ENCOUNTER — Other Ambulatory Visit: Payer: Self-pay | Admitting: Cardiovascular Disease

## 2020-02-06 DIAGNOSIS — I25119 Atherosclerotic heart disease of native coronary artery with unspecified angina pectoris: Secondary | ICD-10-CM

## 2020-02-06 DIAGNOSIS — H9319 Tinnitus, unspecified ear: Secondary | ICD-10-CM

## 2020-02-06 DIAGNOSIS — I480 Paroxysmal atrial fibrillation: Secondary | ICD-10-CM

## 2020-02-06 MED FILL — ALPRAZolam 0.5 MG TABS: 0.5 | 30 days supply | Qty: 60 | Fill #1

## 2020-02-08 ENCOUNTER — Other Ambulatory Visit: Payer: Self-pay

## 2020-02-08 MED ORDER — ROSUVASTATIN CALCIUM 40 MG PO TABS
40.0000 mg | ORAL_TABLET | Freq: Every day | ORAL | 0 refills | Status: DC
Start: 2020-02-08 — End: 2020-02-10

## 2020-02-08 NOTE — Telephone Encounter (Signed)
Please review refill for Imdur.   Per patients chart 2 are listed " isosorbide dinitrate (ISORDIL) 30 MG tablet isosorbide mononitrate (IMDUR) 30 MG 24 hr tablet"  Per Visser's last office note "isosorbide mononitrate (IMDUR) 30 MG 24 hr tablet" was only listed.   Can not find where the switch was.

## 2020-02-08 NOTE — Telephone Encounter (Signed)
rosuvastatin (CRESTOR) 40 MG tablet 90 tablet 0 02/08/2020    Sig - Route: Take 1 tablet (40 mg total) by mouth daily. - Oral   Sent to pharmacy as: rosuvastatin (CRESTOR) 40 MG tablet   E-Prescribing Status: Receipt confirmed by pharmacy (02/08/2020 12:01 PM EST)   Pharmacy  [id number] - [place], [address]

## 2020-02-09 ENCOUNTER — Other Ambulatory Visit: Payer: Self-pay

## 2020-02-10 ENCOUNTER — Other Ambulatory Visit: Payer: Self-pay | Admitting: Cardiovascular Disease

## 2020-02-10 MED ORDER — ROSUVASTATIN CALCIUM 40 MG PO TABS
40.0000 mg | ORAL_TABLET | Freq: Every day | ORAL | 0 refills | Status: DC
Start: 2020-02-10 — End: 2020-03-30

## 2020-02-10 MED ORDER — ISOSORBIDE MONONITRATE ER 30 MG PO TB24
30.0000 mg | ORAL_TABLET | Freq: Every day | ORAL | 0 refills | Status: DC
Start: 2020-02-10 — End: 2020-02-11

## 2020-02-10 MED FILL — ROSUVASTATIN CALCIUM 40 MG: 40 | 90 days supply | Qty: 90 | Fill #0

## 2020-02-10 MED FILL — ISOSORBIDE MONONITRATE ER 3: 30 | 90 days supply | Qty: 90 | Fill #0

## 2020-02-10 NOTE — Telephone Encounter (Signed)
He should only be taking isosorbide mononitrate 30 mg once daily.  The mononitrate is the long acting form.  The dinitrate is the short acting form and we rarely use that. It looks like that was put on his chart by someone not in our practice, so I wonder if this was listed in error.  Would refill the isosorbide mononitrate and d/c the dinitrate off of his list.  Thanks!

## 2020-02-10 NOTE — Telephone Encounter (Signed)
*  STAT* If patient is at the pharmacy, call can be transferred to refill team.   1. Which medications need to be refilled? (please list name of each medication and dose if known) imdur 30 mg daily, rosuvastatin 40 mg  2. Which pharmacy/location (including street and city if local pharmacy) is medication to be sent to? [facility]   3. Do they need a 30 day or 90 day supply? [place]

## 2020-02-11 ENCOUNTER — Encounter: Payer: Self-pay | Admitting: Neurology

## 2020-02-11 ENCOUNTER — Other Ambulatory Visit: Payer: Self-pay | Admitting: Cardiovascular Disease

## 2020-02-11 MED ORDER — ISOSORBIDE MONONITRATE ER 30 MG PO TB24: 30.0000 mg | ORAL_TABLET | Freq: Every day | ORAL | 3 refills | Status: DC

## 2020-02-11 MED ORDER — ISOSORBIDE MONONITRATE ER 30 MG PO TB24: 30.0000 mg | ORAL_TABLET | Freq: Every day | ORAL | 0 refills | Status: DC

## 2020-02-11 NOTE — Addendum Note (Signed)
Addended by: Janan Ridge on: 02/11/2020 09:17 AM   Modules accepted: Orders

## 2020-02-11 NOTE — Telephone Encounter (Signed)
Updated medication list and sent in patients refills.

## 2020-02-12 NOTE — Telephone Encounter (Signed)
There is an implantable device that creates white noise, blending out tinnitus, I think Dr. Benjamine Mola in [facility] may be familiar with it.

## 2020-02-12 NOTE — Telephone Encounter (Signed)
There is an implantable device that creates white noise, blending out tinnitus, I think Dr. Benjamine Mola in [facility] may be familiar with it.    FYI:  I have sent a referral to Dr. Benjamine Mola- he may also tell me who else is treating tinnitus if he is not.

## 2020-02-15 ENCOUNTER — Other Ambulatory Visit: Payer: Self-pay | Admitting: Cardiovascular Disease

## 2020-02-15 ENCOUNTER — Ambulatory Visit: Payer: 59 | Attending: Internal Medicine

## 2020-02-15 ENCOUNTER — Other Ambulatory Visit: Payer: Self-pay | Admitting: Neurology

## 2020-02-15 DIAGNOSIS — H9313 Tinnitus, bilateral: Secondary | ICD-10-CM

## 2020-02-15 DIAGNOSIS — I69319 Unspecified symptoms and signs involving cognitive functions following cerebral infarction: Secondary | ICD-10-CM

## 2020-02-15 DIAGNOSIS — Z23 Encounter for immunization: Secondary | ICD-10-CM

## 2020-02-15 MED FILL — METOPROLOL TARTRATE 25 MG T: 25 | 90 days supply | Qty: 180 | Fill #0

## 2020-02-15 MED FILL — RANEXA ER 1,000 MG TABLET: 1000 | 30 days supply | Qty: 60 | Fill #3

## 2020-02-15 NOTE — Progress Notes (Signed)
   Covid-19 Vaccination Clinic  Name:  Kevin [name]    MRN: [id number] DOB: 1955-05-11  02/15/2020  [name] was observed post Covid-19 immunization for 15 minutes without incident. He was provided with Vaccine Information Sheet and instruction to access the V-Safe system.   [name] was instructed to call 911 with any severe reactions post vaccine: Marland Kitchen Difficulty breathing  . Swelling of face and throat  . A fast heartbeat  . A bad rash all over body  . Dizziness and weakness   Immunizations Administered    Name Date Dose VIS Date Route   Pfizer COVID-19 Vaccine 02/15/2020  3:44 PM 0.3 mL 01/20/2020 Intramuscular   Manufacturer: Elim   Lot: [id number]   [place]: [id number]

## 2020-02-17 ENCOUNTER — Telehealth: Payer: Self-pay

## 2020-02-17 ENCOUNTER — Other Ambulatory Visit: Payer: Self-pay

## 2020-02-17 MED ORDER — PANTOPRAZOLE SODIUM 20 MG PO TBEC
20.0000 mg | DELAYED_RELEASE_TABLET | Freq: Every day | ORAL | 1 refills | Status: DC
Start: 2020-02-17 — End: 2020-03-04

## 2020-02-17 NOTE — Telephone Encounter (Signed)
Patients wife left voicemail on office phone for Herb Grays letting her know th correct number to reach her is [phone].

## 2020-02-17 NOTE — Telephone Encounter (Signed)
Called patient's wife-Sue so we could schedule patient's EGD but I realized that the patient is currently on ASA and Plavix. Therefore, I told Collie Siad that I would have to get in contact with Dr. Donivan Scull office first to request a blood this hold in order to know when he would need to hold and restart his medications. Collie Siad understood and had no further questions. I told Collie Siad that I would call her once I had instructions to schedule Kevin [name]'s EGD. She agreed. Blood thinner will be faxed to Dr. Donivan Scull office today.

## 2020-02-17 NOTE — Telephone Encounter (Signed)
[phone] 

## 2020-02-18 ENCOUNTER — Telehealth: Payer: Self-pay | Admitting: Cardiovascular Disease

## 2020-02-18 NOTE — Telephone Encounter (Signed)
Left message to call back.  See previous recommendations for continuing Asprin and holding Plavix 5 days pre op.   Kerin Ransom PA-C 02/18/2020 10:17 AM

## 2020-02-18 NOTE — Telephone Encounter (Signed)
   [facility] Pre-operative Risk Assessment    HEARTCARE STAFF: - Please ensure there is not already an duplicate clearance open for this procedure. - Under Visit Info/Reason for Call, type in Other and utilize the format Clearance MM/DD/YY or Clearance TBD. Do not use dashes or single digits. - If request is for dental extraction, please clarify the # of teeth to be extracted.  Request for surgical clearance:  1. What type of surgery is being performed? EGD  2. When is this surgery scheduled? TBD  What type of clearance is required (medical clearance vs. Pharmacy clearance to hold med vs. Both)?  BOTH 3. Are there any medications that need to be held prior to surgery and how long? ASPIRIN AND PLAVIX  Practice name and name of physician performing surgery?  [facility] What is the office phone number? [phone]  7.   What is the office fax number? [place]: MARITZA  8.   Anesthesia type (None, local, MAC, general) ? NOT LISTED   Elissa Hefty 02/18/2020, 9:28 AM  _________________________________________________________________   (provider comments below)

## 2020-02-19 NOTE — Telephone Encounter (Signed)
   Primary Cardiologist: Ida Rogue, MD  Chart reviewed as part of pre-operative protocol coverage. Patient was contacted 02/19/2020 in reference to pre-operative risk assessment for pending surgery as outlined below.  Kevin [name] was last seen on 09/23/19 by Marrianne Mood, PA-C.  Since that day, Kevin [name] has done well from a cardiac standpoint. He can easily complete 4 METs without anginal complaints.   Therefore, based on ACC/AHA guidelines, the patient would be at acceptable risk for the planned procedure without further cardiovascular testing.   The patient was advised that if he develops new symptoms prior to surgery to contact our office to arrange for a follow-up visit, and he verbalized understanding.  Per Dr. Donivan Scull previous recommendations, patient can hold plavix 5 days prior to his upcoming endoscopy, however should remain on aspirin throughout. Plavix should be restarted when cleared to do so by his gastroenterologist.   I will route this recommendation to the requesting party via Epic fax function and remove from pre-op pool. Please call with questions.  Abigail Butts, PA-C 02/19/2020, 11:38 AM

## 2020-02-22 ENCOUNTER — Other Ambulatory Visit: Payer: Self-pay

## 2020-02-22 DIAGNOSIS — K219 Gastro-esophageal reflux disease without esophagitis: Secondary | ICD-10-CM

## 2020-02-22 NOTE — Telephone Encounter (Signed)
Received clearance from Dr. Rockey Situ letting us know that the patient is to hold his Plavix 5 days prior to procedure and restart it as soon his gastroenterologist clears him to do so. Patient is to remain on his Aspirin throughout. This letter will be in patient's media.

## 2020-03-01 ENCOUNTER — Other Ambulatory Visit
Admission: RE | Admit: 2020-03-01 | Discharge: 2020-03-01 | Disposition: A | Discharge status: Home / Self Care | Payer: 59 | Source: Ambulatory Visit | Attending: Gastroenterology | Admitting: Gastroenterology

## 2020-03-01 ENCOUNTER — Other Ambulatory Visit: Payer: Self-pay

## 2020-03-01 DIAGNOSIS — Z01812 Encounter for preprocedural laboratory examination: Secondary | ICD-10-CM | POA: Insufficient documentation

## 2020-03-01 DIAGNOSIS — Z20822 Contact with and (suspected) exposure to covid-19: Secondary | ICD-10-CM | POA: Insufficient documentation

## 2020-03-02 LAB — SARS CORONAVIRUS 2 (TAT 6-24 HRS): SARS Coronavirus 2: NEGATIVE

## 2020-03-03 ENCOUNTER — Ambulatory Visit: Payer: 59 | Admitting: Certified Registered"

## 2020-03-03 ENCOUNTER — Encounter
Admission: RE | Disposition: A | Discharge status: Home / Self Care | Payer: Self-pay | Source: Home / Self Care | Attending: Gastroenterology

## 2020-03-03 ENCOUNTER — Encounter: Payer: Self-pay | Admitting: Gastroenterology

## 2020-03-03 ENCOUNTER — Other Ambulatory Visit: Payer: Self-pay | Admitting: Gastroenterology

## 2020-03-03 ENCOUNTER — Ambulatory Visit
Admission: RE | Admit: 2020-03-03 | Discharge: 2020-03-03 | Disposition: A | Discharge status: Home / Self Care | Payer: 59 | Attending: Gastroenterology | Admitting: Gastroenterology

## 2020-03-03 DIAGNOSIS — Z951 Presence of aortocoronary bypass graft: Secondary | ICD-10-CM | POA: Insufficient documentation

## 2020-03-03 DIAGNOSIS — Z8673 Personal history of transient ischemic attack (TIA), and cerebral infarction without residual deficits: Secondary | ICD-10-CM | POA: Diagnosis not present

## 2020-03-03 DIAGNOSIS — E785 Hyperlipidemia, unspecified: Secondary | ICD-10-CM | POA: Diagnosis not present

## 2020-03-03 DIAGNOSIS — K222 Esophageal obstruction: Secondary | ICD-10-CM | POA: Diagnosis not present

## 2020-03-03 DIAGNOSIS — G4733 Obstructive sleep apnea (adult) (pediatric): Secondary | ICD-10-CM | POA: Diagnosis not present

## 2020-03-03 DIAGNOSIS — K219 Gastro-esophageal reflux disease without esophagitis: Secondary | ICD-10-CM | POA: Diagnosis not present

## 2020-03-03 DIAGNOSIS — Z7982 Long term (current) use of aspirin: Secondary | ICD-10-CM | POA: Insufficient documentation

## 2020-03-03 DIAGNOSIS — Z955 Presence of coronary angioplasty implant and graft: Secondary | ICD-10-CM | POA: Insufficient documentation

## 2020-03-03 DIAGNOSIS — R1319 Other dysphagia: Secondary | ICD-10-CM

## 2020-03-03 DIAGNOSIS — R1310 Dysphagia, unspecified: Secondary | ICD-10-CM | POA: Diagnosis not present

## 2020-03-03 DIAGNOSIS — I10 Essential (primary) hypertension: Secondary | ICD-10-CM | POA: Diagnosis not present

## 2020-03-03 DIAGNOSIS — Z8774 Personal history of (corrected) congenital malformations of heart and circulatory system: Secondary | ICD-10-CM | POA: Diagnosis not present

## 2020-03-03 DIAGNOSIS — Z79899 Other long term (current) drug therapy: Secondary | ICD-10-CM | POA: Insufficient documentation

## 2020-03-03 HISTORY — PX: ESOPHAGOGASTRODUODENOSCOPY: SHX5428

## 2020-03-03 SURGERY — EGD (ESOPHAGOGASTRODUODENOSCOPY)
Anesthesia: General

## 2020-03-03 MED ORDER — PROPOFOL 500 MG/50ML IV EMUL
INTRAVENOUS | Status: AC
  Filled 2020-03-03: qty 50

## 2020-03-03 MED ORDER — PROPOFOL 10 MG/ML IV BOLUS
INTRAVENOUS | Status: DC | PRN
  Administered 2020-03-03: 60 mg via INTRAVENOUS

## 2020-03-03 MED ORDER — SODIUM CHLORIDE 0.9 % IV SOLN: INTRAVENOUS | Status: DC

## 2020-03-03 NOTE — Op Note (Signed)
[facility] Patient Name: Kevin [name] Procedure Date: 03/03/2020 10:33 AM MRN: [id number] Account #: [id number] Date of Birth: Jun 29, 1955 Admit Type: Outpatient Age: 64 Room: [facility] ENDO ROOM 2 Gender: Male Note Status: Finalized Procedure:             Upper GI endoscopy Indications:           Dysphagia, Follow-up of esophageal reflux Providers:             Varie Machamer B. Bonna Gains MD, MD Referring MD:          Vickki Muff. Chrismon, MD (Referring MD) Medicines:             Monitored Anesthesia Care Complications:         No immediate complications. Procedure:             Pre-Anesthesia Assessment:                        - Prior to the procedure, a History and Physical was                         performed, and patient medications, allergies and                         sensitivities were reviewed. The patient's tolerance                         of previous anesthesia was reviewed.                        - The risks and benefits of the procedure and the                         sedation options and risks were discussed with the                         patient. All questions were answered and informed                         consent was obtained.                        - Patient identification and proposed procedure were                         verified prior to the procedure by the physician, the                         nurse, the anesthesiologist, the anesthetist and the                         technician. The procedure was verified in the                         procedure room.                        - ASA Grade Assessment: II - A patient with mild  systemic disease.                        After obtaining informed consent, the endoscope was                         passed under direct vision. Throughout the procedure,                         the patient's blood pressure, pulse, and oxygen                         saturations were  monitored continuously. The Endoscope                         was introduced through the mouth, and advanced to the                         second part of duodenum. The upper GI endoscopy was                         accomplished with ease. The patient tolerated the                         procedure well. Findings:      A mild Schatzki ring was found at the gastroesophageal junction. A TTS       dilator was passed through the scope. Dilation with an 18-19-20 mm       balloon dilator was performed to 20 mm.      The Z-line was regular.      The examined esophagus was normal. Biopsies were obtained from the       proximal and distal esophagus with cold forceps for histology of       suspected eosinophilic esophagitis.      The entire examined stomach was normal. Biopsies were obtained in the       gastric body, at the incisura and in the gastric antrum with cold       forceps for histology. Biopsies were taken with a cold forceps for       Helicobacter pylori testing.      The duodenal bulb, second portion of the duodenum and examined duodenum       were normal. Impression:            - Mild Schatzki ring. Dilated.                        - Z-line regular.                        - Normal esophagus. Biopsied.                        - Normal stomach. Biopsied.                        - Normal duodenal bulb, second portion of the duodenum                         and examined duodenum.                        -  Biopsies were obtained in the gastric body, at the                         incisura and in the gastric antrum. Recommendation:        - Await pathology results.                        - Discharge patient to home (with escort).                        - Advance diet as tolerated.                        - Continue present medications.                        - Patient has a contact number available for                         emergencies. The signs and symptoms of potential                          delayed complications were discussed with the patient.                         Return to normal activities tomorrow. Written                         discharge instructions were provided to the patient.                        - Discharge patient to home (with escort).                        - The findings and recommendations were discussed with                         the patient.                        - The findings and recommendations were discussed with                         the patient's family.                        - Follow an antireflux regimen. Procedure Code(s):     --- Professional ---                        [phone], Esophagogastroduodenoscopy, flexible,                         transoral; with transendoscopic balloon dilation of                         esophagus (less than 30 mm diameter)                        43239, 59, Esophagogastroduodenoscopy, flexible,  transoral; with biopsy, single or multiple Diagnosis Code(s):     --- Professional ---                        K22.2, Esophageal obstruction                        R13.10, Dysphagia, unspecified                        K21.9, Gastro-esophageal reflux disease without                         esophagitis CPT copyright 2019 American Medical Association. All rights reserved. The codes documented in this report are preliminary and upon coder review may  be revised to meet current compliance requirements.  [name], MD Margretta Sidle B. Bonna Gains MD, MD 03/03/2020 10:58:41 AM This report has been signed electronically. Number of Addenda: 0 Note Initiated On: 03/03/2020 10:33 AM Estimated Blood Loss:  Estimated blood loss: none.      [facility]

## 2020-03-03 NOTE — Transfer of Care (Signed)
Immediate Anesthesia Transfer of Care Note  Patient: Kevin [name]  Procedure(s) Performed: ESOPHAGOGASTRODUODENOSCOPY (EGD) (N/A )  Patient Location: PACU and Endoscopy Unit  Anesthesia Type:General  Level of Consciousness: awake, alert  and oriented  Airway & Oxygen Therapy: Patient Spontanous Breathing  Post-op Assessment: Report given to RN and Post -op Vital signs reviewed and stable  Post vital signs: Reviewed and stable  Last Vitals:  Vitals Value Taken Time  BP    Temp    Pulse 62 03/03/20 1059  Resp 17 03/03/20 1059  SpO2 97 % 03/03/20 1059  Vitals shown include unvalidated device data.  Last Pain:  Vitals:   03/03/20 1007  TempSrc: Temporal  PainSc: 0-No pain         Complications: No complications documented.

## 2020-03-03 NOTE — H&P (Signed)
Kevin Antigua, MD [address], [place], [place], [place], [address] [address], [place], [place], [place], [address] Phone: [phone]  Fax: [phone]  Primary Care Physician:  Margo Common, [place]   Pre-Procedure History & Physical: HPI:  Kevin [name] is a 64 y.o. male is here for an EGD.   Past Medical History:  Diagnosis Date  . Brachial plexopathy   . CKD (chronic kidney disease), stage II - III ([place])   . Coronary artery disease    a. s/p 4 vessel CABG in 05/2008 w/ LIMA-LAD, SVG-Diag, sequential SVG-OM1/OM2; b. s/p PCI/DES x 2 to LAD in 10/2009; c. LHC 03/2013 stable disease and patent LAD stents; d. 10/2016 Cath: LM min irregs, LAD 20p, 49m ISR, D2 80ost, LCX small, min irregs, OM1/2/3 min irregs, RCA/RPDA/RPAV min irregs, RPL1/2 nl RPL3 min irregs, VG->D2 nl, VG->OM1->OM2 100, EF 55-65%.  . H/O maze procedure 05/17/2008   a. @ time of CABG  . Hyperlipidemia   . 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   . Hypertension   . Memory deficit after cerebral infarction   . OSA (obstructive sleep apnea)   . Overweight(278.02)   . PAF (paroxysmal atrial fibrillation) ([place])    a. s/p Maze 05/2008, previously on Eliquis->discontinued 05/2016; c. CHADS2VASc => 4 (HTN, stroke x 2, vascular disease)  . Patent foramen ovale    a. 05/2008 s/p closure @ time of CABG.  . Psoriatic arthritis ([place])   . Sleep apnea   . Stroke [facility])    a. 07/2005 right brain; b. 05/2005 left brain; c. Resultant memory deficits.  . Thrombocytopenia ([place])     Past Surgical History:  Procedure Laterality Date  . ACUTE PANCREATITIS  5/11  . APPENDECTOMY  1984  . CARDIAC CATHETERIZATION    . CHOLECYSTECTOMY  08/19/2009  . COLONOSCOPY WITH PROPOFOL N/A 06/07/2017   Procedure: COLONOSCOPY WITH PROPOFOL;  Surgeon: Virgel Manifold, MD;  Location: [facility] ENDOSCOPY;  Service: Endoscopy;  Laterality: N/A;  . CORONARY ARTERY BYPASS GRAFT  05/12/2008   x4 by PeterVan  Trigt,MD  . LEFT HEART CATH AND CORONARY ANGIOGRAPHY N/A 11/29/2016   Procedure: LEFT HEART CATH AND CORONARY ANGIOGRAPHY;  Surgeon: Wellington Hampshire, MD;  Location: [facility];  Service: Cardiovascular;  Laterality: N/A;  . PATENT FORAMEN OVALE CLOSURE    . TONSILLECTOMY     AS A CHILD    Prior to Admission medications   Medication Sig Start Date End Date Taking? Authorizing Provider  Apoaequorin (PREVAGEN PO) Take 1 capsule by mouth daily.   Yes [provider]  aspirin EC 81 MG tablet Take 81 mg by mouth daily.   Yes [provider]  cetirizine (ZYRTEC) 10 MG tablet Take 10 mg by mouth at bedtime.    Yes [provider]  ezetimibe (ZETIA) 10 MG tablet TAKE 1 TABLET BY MOUTH ONCE DAILY 01/29/20  Yes Gollan, Kathlene November, MD  isosorbide mononitrate (IMDUR) 30 MG 24 hr tablet Take 1 tablet (30 mg total) by mouth daily. 02/11/20 05/11/20 Yes Gollan, Kathlene November, MD  LEXAPRO 20 MG tablet TAKE 1 TABLET BY MOUTH DAILY. 09/14/19  Yes Birdie Sons, MD  metoprolol tartrate (LOPRESSOR) 25 MG tablet TAKE 1 TABLET BY MOUTH TWO TIMES DAILY 02/15/20  Yes Gollan, Kathlene November, MD  pantoprazole (PROTONIX) 20 MG tablet Take 1 tablet (20 mg total) by mouth daily. 02/17/20  Yes Virgel Manifold, MD  RANEXA 1000 MG SR tablet Take 1 tablet (1,000 mg total) by mouth 2 (two) times daily. 11/11/19  Yes Theora Gianotti, NP  rosuvastatin (CRESTOR) 40 MG tablet Take 1 tablet (40 mg total) by mouth daily. 02/10/20  Yes Gollan, Kathlene November, MD  ALPRAZolam Duanne Moron) 0.5 MG tablet TAKE 1 - 2 TABLETS BY MOUTH EVERY EVENING AT BEDTIME 12/18/19   Chrismon, Vickki Muff, PA  ARTHRITIS PAIN RELIEF 650 MG CR tablet Take 650 mg by mouth every 8 (eight) hours as needed. 08/05/19   [provider]  clobetasol ointment (TEMOVATE) 4.23 % Apply 1 application topically 2 (two) times daily as needed (for psoriasis).    [provider]  clopidogrel (PLAVIX) 75 MG tablet TAKE 1 TABLET BY  MOUTH ONCE DAILY 09/02/19   Minna Merritts, MD  desonide (DESOWEN) 0.05 % cream Apply 1 application topically 2 (two) times daily as needed (for psoriasis).    [provider]  nitroGLYCERIN (NITROSTAT) 0.4 MG SL tablet Place 1 tablet (0.4 mg total) under the tongue every 5 (five) minutes as needed. May repeat for up to 3 doses. 04/22/18   Minna Merritts, MD    Allergies as of 02/23/2020 - Review Complete 02/03/2020  Allergen Reaction Noted  . Strawberry extract Anaphylaxis 12/04/2011  . Dilaudid [hydromorphone hcl] Nausea And Vomiting 06/21/2010    Family History  Problem Relation Age of Onset  . Diabetes Sister   . Ovarian cancer Sister   . Psychiatric Illness Father   . Heart attack Father   . Diabetes Other   . Hypertension Mother   . Heart disease Mother   . Lung cancer Paternal Aunt   . Coronary artery disease Other   . Diabetes Other   . Hypertension Other   . Hyperlipidemia Other   . Cancer Paternal Uncle   . Heart disease Paternal Grandmother   . Heart disease Paternal Grandfather     Social History   Socioeconomic History  . Marital status: Married    Spouse name: [name]  . Number of children: 3  . Years of education: Not on file  . Highest education level: Not on file  Occupational History  . Occupation: Full time    Employer: [employer]  Tobacco Use  . Smoking status: Never Smoker  . Smokeless tobacco: Never Used  Vaping Use  . Vaping Use: Never used  Substance and Sexual Activity  . Alcohol use: Yes    Comment: occassionally  . Drug use: No  . Sexual activity: Not on file  Other Topics Concern  . 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 Went to high school. He has worked at his present job for 22 years. He has been married for 34 years. They have 3 children. 2 of his daughters live in [place]. He drinks less  than two cups of caffeine per day. He does not use  tobacco or recreational drugs. He has rare alcohol intake. Lives with wife and daughter      OSA diagnosed at [facility] , had his  sleep studies there  reviewed them all in detail today he  has retrognathia, sinusitis,  rhinitis      His ESS remains very high  at 16 and FSS at 29 . His falls assessment tool score is 5.   nasonex, refitted mask, the recent  HST failed to document that  apnea is present at all. education about REM BD and EDS, narcolepsy , cataplexy.  45 minutes.    Social Determinants of Health   Financial Resource Strain:   . Difficulty of Paying Living Expenses: Not on file  Food Insecurity:   . Worried About Charity fundraiser in the Last Year: Not on file  . Ran Out of Food in the Last Year: Not on file  Transportation [name]:   . Lack of Transportation (Medical): Not on file  . Lack of Transportation (Non-Medical): Not on file  Physical Activity:   . Days of Exercise per Week: Not on file  . Minutes of Exercise per Session: Not on file  Stress:   . Feeling of Stress : Not on file  Social Connections:   . Frequency of Communication with Friends and Family: Not on file  . Frequency of Social Gatherings with Friends and Family: Not on file  . Attends Religious Services: Not on file  . Active Member of Clubs or Organizations: Not on file  . Attends Archivist Meetings: Not on file  . Marital Status: Not on file  Intimate Partner Violence:   . Fear of Current or Ex-Partner: Not on file  . Emotionally Abused: Not on file  . Physically Abused: Not on file  . Sexually Abused: Not on file    Review of Systems: See HPI, otherwise negative ROS  Physical Exam: BP 119/74   Pulse (!) 54   Temp (!) 97 F (36.1 C) (Temporal)   Resp 18   Ht 5\' 6"  (1.676 m)   Wt 88.5 kg   SpO2 99%   BMI 31.47 kg/m  General:   Alert,  pleasant and cooperative in NAD Head:  Normocephalic and atraumatic. Neck:   Supple; no masses or thyromegaly. Lungs:  Clear throughout to auscultation, normal respiratory effort.    Heart:  +S1, +S2, Regular rate and rhythm, No edema. Abdomen:  Soft, nontender and nondistended. Normal bowel sounds, without guarding, and without rebound.   Neurologic:  Alert and  oriented x4;  grossly normal neurologically.  Impression/Plan: Kevin [name] is here for an EGD for Acid Reflux and dysphagia  Risks, benefits, limitations, and alternatives regarding the procedure have been reviewed with the patient.  Questions have been answered.  All parties agreeable.   Virgel Manifold, MD  03/03/2020, 10:32 AM

## 2020-03-03 NOTE — Anesthesia Preprocedure Evaluation (Signed)
Anesthesia Evaluation  Patient identified by MRN, date of birth, ID band Patient awake    Reviewed: Allergy & Precautions, H&P , NPO status , reviewed documented beta blocker date and time   Airway Mallampati: III   Neck ROM: full    Dental  (+) Caps   Pulmonary sleep apnea and Continuous Positive Airway Pressure Ventilation ,  Wears CPAP, education re TIVA and OSA done   Pulmonary exam normal        Cardiovascular hypertension, + angina + CAD  Normal cardiovascular examAtrial Fibrillation   08/2008 ECHO Study Conclusions   1. Left ventricle: The cavity size was normal. Wall thickness was   normal. Systolic function was normal. The estimated ejection   fraction was in the range of 55% to 60%. Wall motion was normal;   there were no regional wall motion abnormalities. Left   ventricular diastolic function parameters were normal.  2. Left atrium: The atrium was mildly dilated.  3. Atrial septum: No defect or patent foramen ovale was identified.   10/2016 CATH  The left ventricular systolic function is normal.  LV end diastolic pressure is mildly elevated.  The left ventricular ejection fraction is 55-65% by visual estimate.  Mid LAD lesion, 15 %stenosed.  Prox LAD lesion, 20 %stenosed.  Ost 2nd Diag to 2nd Diag lesion, 80 %stenosed.  SVG and is normal in caliber.  The graft exhibits minimal luminal irregularities.  SVG.  Origin lesion, 100 %stenosed.   1. Significant underlying one-vessel coronary artery disease with patent SVG to second diagonal, patent LAD stent with mild in-stent restenosis and known occluded SVG to OM. Native RCA and left circumflex don't have obstructive disease.  2. Normal LV systolic function and mildly elevated left ventricular end-diastolic pressure.  Recommendations: Continue medical therapy.  Hx Afib - s/p MAZE and medical rx, now stable w SR   Neuro/Psych Anxiety  Vision loss upper quadrants p CVA  Neuromuscular disease CVA, Residual Symptoms    GI/Hepatic GERD  ,  Endo/Other    Renal/GU Renal disease     Musculoskeletal  (+) Arthritis ,   Abdominal   Peds  Hematology   Anesthesia Other Findings Past Medical History: No date: Brachial plexopathy No date: CKD (chronic kidney disease), stage II - III ([facility]) No date: Coronary artery disease     Comment:  a. s/p 4 vessel CABG in 05/2008 w/ LIMA-LAD, SVG-Diag,               sequential SVG-OM1/OM2; b. s/p PCI/DES x 2 to LAD in               10/2009; c. LHC 03/2013 stable disease and patent LAD               stents; d. 10/2016 Cath: LM min irregs, LAD 20p, 36m ISR,               D2 80ost, LCX small, min irregs, OM1/2/3 min irregs,               RCA/RPDA/RPAV min irregs, RPL1/2 nl RPL3 min irregs,               VG->D2 nl, VG->OM1->OM2 100, EF 55-65%. 05/17/2008: H/O maze procedure     Comment:  a. @ time of CABG No date: Hyperlipidemia 09/24/2012: Hypersomnia, organic     Comment:   CVA and CAD related , AHi less than 5 .  No date: Hypertension No date: Memory deficit after cerebral infarction No date:  OSA (obstructive sleep apnea) No date: Overweight(278.02) No date: PAF (paroxysmal atrial fibrillation) ([place])     Comment:  a. s/p Maze 05/2008, previously on Eliquis->discontinued               05/2016; c. CHADS2VASc => 4 (HTN, stroke x 2, vascular               disease) No date: Patent foramen ovale     Comment:  a. 05/2008 s/p closure @ time of CABG. No date: Psoriatic arthritis ([place]) No date: Sleep apnea No date: Stroke [facility])     Comment:  a. 07/2005 right brain; b. 05/2005 left brain; c.               Resultant memory deficits. No date: Thrombocytopenia ([place])  Past Surgical History: 5/11: ACUTE PANCREATITIS 1984: APPENDECTOMY No date: CARDIAC CATHETERIZATION 08/19/2009: CHOLECYSTECTOMY 06/07/2017: COLONOSCOPY WITH PROPOFOL; N/A     Comment:  Procedure: COLONOSCOPY WITH PROPOFOL;   Surgeon:               Virgel Manifold, MD;  Location: [facility] ENDOSCOPY;                Service: Endoscopy;  Laterality: N/A; 05/12/2008: CORONARY ARTERY BYPASS GRAFT     Comment:  x4 by PeterVan Trigt,MD 11/29/2016: LEFT HEART CATH AND CORONARY ANGIOGRAPHY; N/A     Comment:  Procedure: LEFT HEART CATH AND CORONARY ANGIOGRAPHY;                Surgeon: Wellington Hampshire, MD;  Location: [facility]               CV LAB;  Service: Cardiovascular;  Laterality: N/A; No date: PATENT FORAMEN OVALE CLOSURE No date: TONSILLECTOMY     Comment:  AS A CHILD  BMI    Body Mass Index: 31.47 kg/m      Reproductive/Obstetrics                            Anesthesia Physical Anesthesia Plan  ASA: III  Anesthesia Plan: General   Post-op Pain Management:    Induction: Intravenous  PONV Risk Score and Plan: Treatment may vary due to age or medical condition and TIVA  Airway Management Planned: Nasal Cannula and Natural Airway  Additional Equipment:   Intra-op Plan:   Post-operative Plan:   Informed Consent: I have reviewed the patients History and Physical, chart, labs and discussed the procedure including the risks, benefits and alternatives for the proposed anesthesia with the patient or authorized representative who has indicated his/her understanding and acceptance.     Dental Advisory Given  Plan Discussed with: CRNA  Anesthesia Plan Comments:         Anesthesia Quick Evaluation

## 2020-03-03 NOTE — Anesthesia Postprocedure Evaluation (Signed)
Anesthesia Post Note  Patient: Kevin [name]  Procedure(s) Performed: ESOPHAGOGASTRODUODENOSCOPY (EGD) (N/A )  Patient location during evaluation: Endoscopy Anesthesia Type: General Level of consciousness: awake and alert Pain management: pain level controlled Vital Signs Assessment: post-procedure vital signs reviewed and stable Respiratory status: spontaneous breathing, nonlabored ventilation and respiratory function stable Cardiovascular status: blood pressure returned to baseline and stable Postop Assessment: no apparent nausea or vomiting Anesthetic complications: no   No complications documented.   Last Vitals:  Vitals:   03/03/20 1059 03/03/20 1119  BP: 132/83 127/87  Pulse:    Resp:    Temp: 36.7 C   SpO2:      Last Pain:  Vitals:   03/03/20 1119  TempSrc:   PainSc: 0-No pain                 Alphonsus Sias

## 2020-03-03 NOTE — Anesthesia Procedure Notes (Signed)
Procedure Name: MAC Date/Time: 03/03/2020 10:37 AM Performed by: Jerrye Noble, CRNA Pre-anesthesia Checklist: Patient identified, Emergency Drugs available and Suction available Patient Re-evaluated:Patient Re-evaluated prior to induction Oxygen Delivery Method: Supernova nasal CPAP

## 2020-03-04 ENCOUNTER — Other Ambulatory Visit: Payer: Self-pay

## 2020-03-04 ENCOUNTER — Other Ambulatory Visit: Payer: Self-pay | Admitting: Gastroenterology

## 2020-03-04 ENCOUNTER — Encounter: Payer: Self-pay | Admitting: Gastroenterology

## 2020-03-04 ENCOUNTER — Telehealth: Payer: Self-pay | Admitting: Gastroenterology

## 2020-03-04 LAB — SURGICAL PATHOLOGY

## 2020-03-04 MED ORDER — PANTOPRAZOLE SODIUM 20 MG PO TBEC
20.0000 mg | DELAYED_RELEASE_TABLET | Freq: Every day | ORAL | 1 refills | Status: DC
Start: 2020-03-04 — End: 2020-03-04

## 2020-03-04 MED FILL — PANTOPRAZOLE SOD DR 20 MG T: 20 | 90 days supply | Qty: 90 | Fill #0

## 2020-03-04 NOTE — Telephone Encounter (Signed)
Patients wife calling stating she tried to refill pts medication pantoprazole and it was denied. Per wife pt only has a weeks forth left and would like a refill sent to [facility]. Pt had procedure yesterday 12.2.21.

## 2020-03-04 NOTE — Telephone Encounter (Signed)
Called patient back but spoke to wife-Susanne and told her that I would be sending his prescription refill for a 90 day supply to his pharmacy [facility]. She did not have any further questions.

## 2020-03-08 ENCOUNTER — Encounter: Payer: Self-pay | Admitting: Gastroenterology

## 2020-03-10 MED FILL — CLOPIDOGREL 75 MG TABLET: 75 | 90 days supply | Qty: 90 | Fill #2

## 2020-03-14 ENCOUNTER — Other Ambulatory Visit: Payer: Self-pay | Admitting: Family Medicine

## 2020-03-14 MED FILL — RANEXA ER 1,000 MG TABLET: 1000 | 30 days supply | Qty: 60 | Fill #4

## 2020-03-14 MED FILL — LEXAPRO 20 MG TABLET: 20 | 90 days supply | Qty: 90 | Fill #0

## 2020-03-14 NOTE — Telephone Encounter (Signed)
Requested Prescriptions  Pending Prescriptions Disp Refills  . LEXAPRO 20 MG tablet [Pharmacy Med Name: LEXAPRO 20 MG TABLET 20 Tablet] 90 tablet 0    Sig: TAKE 1 TABLET BY MOUTH DAILY.     Psychiatry:  Antidepressants - SSRI Failed - 03/14/2020  9:05 AM      Failed - Valid encounter within last 6 months    Recent Outpatient Visits          6 months ago Annual physical exam   [place], PA-C   10 months ago Dysfunction of left eustachian tube   [place], Vickki Muff, PA-C   1 year ago [place], PA-C   1 year ago Subacute maxillary sinusitis   [facility], Vickki Muff, PA-C   1 year ago Sore throat   [facility] [facility], [place]      Future Appointments            Tomorrow Chrismon, Vickki Muff, PA-C [facility], [place]   In 2 weeks Gollan, Kathlene November, MD [place], [place] and patient is scheduled for 6 month F/U tomorrow.  Gave 90 day refill.

## 2020-03-15 ENCOUNTER — Ambulatory Visit (INDEPENDENT_AMBULATORY_CARE_PROVIDER_SITE_OTHER): Payer: 59 | Admitting: Family Medicine

## 2020-03-15 ENCOUNTER — Other Ambulatory Visit: Payer: Self-pay

## 2020-03-15 ENCOUNTER — Encounter: Payer: Self-pay | Admitting: Family Medicine

## 2020-03-15 VITALS — BP 114/64 | HR 70 | Temp 98.4°F | Wt 207.0 lb

## 2020-03-15 DIAGNOSIS — F419 Anxiety disorder, unspecified: Secondary | ICD-10-CM | POA: Diagnosis not present

## 2020-03-15 DIAGNOSIS — G4730 Sleep apnea, unspecified: Secondary | ICD-10-CM

## 2020-03-15 DIAGNOSIS — I10 Essential (primary) hypertension: Secondary | ICD-10-CM | POA: Diagnosis not present

## 2020-03-15 DIAGNOSIS — E7849 Other hyperlipidemia: Secondary | ICD-10-CM | POA: Diagnosis not present

## 2020-03-15 DIAGNOSIS — I69319 Unspecified symptoms and signs involving cognitive functions following cerebral infarction: Secondary | ICD-10-CM

## 2020-03-15 NOTE — Progress Notes (Signed)
Established patient visit   Patient: Kevin [name]   DOB: 20-Oct-1955   64 y.o. Male  MRN: [id number] Visit Date: 03/15/2020  Today's healthcare provider: Vernie Murders, PA-C   No chief complaint on file.  Subjective    HPI  Patient is a 64 year old male who presents for follow up to get refills on his Lexapro.    Past Medical History:  Diagnosis Date   Brachial plexopathy    CKD (chronic kidney disease), stage II - III ([place])    Coronary artery disease    a. s/p 4 vessel CABG in 05/2008 w/ LIMA-LAD, SVG-Diag, sequential SVG-OM1/OM2; b. s/p PCI/DES x 2 to LAD in 10/2009; c. LHC 03/2013 stable disease and patent LAD stents; d. 10/2016 Cath: LM min irregs, LAD 20p, 40m ISR, D2 80ost, LCX small, min irregs, OM1/2/3 min irregs, RCA/RPDA/RPAV min irregs, RPL1/2 nl RPL3 min irregs, VG->D2 nl, VG->OM1->OM2 100, EF 55-65%.   H/O maze procedure 05/17/2008   a. 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    Hypertension    Memory deficit after cerebral infarction    OSA (obstructive sleep apnea)    Overweight(278.02)    PAF (paroxysmal atrial fibrillation) ([place])    a. s/p Maze 05/2008, previously on Eliquis->discontinued 05/2016; c. CHADS2VASc => 4 (HTN, stroke x 2, vascular disease)   Patent foramen ovale    a. 05/2008 s/p closure @ time of CABG.   Psoriatic arthritis ([place])    Sleep apnea    Stroke ([place])    a. 07/2005 right brain; b. 05/2005 left brain; c. Resultant memory deficits.   Thrombocytopenia [facility])    Past Surgical History:  Procedure Laterality Date   ACUTE PANCREATITIS  5/11   APPENDECTOMY  1984   CARDIAC CATHETERIZATION     CHOLECYSTECTOMY  08/19/2009   COLONOSCOPY WITH PROPOFOL N/A 06/07/2017   Procedure: COLONOSCOPY WITH PROPOFOL;  Surgeon: Virgel Manifold, MD;  Location: [facility] ENDOSCOPY;  Service: Endoscopy;  Laterality: N/A;   CORONARY ARTERY BYPASS GRAFT  05/12/2008   x4 by  PeterVan Trigt,MD   ESOPHAGOGASTRODUODENOSCOPY N/A 03/03/2020   Procedure: ESOPHAGOGASTRODUODENOSCOPY (EGD);  Surgeon: Virgel Manifold, MD;  Location: [facility]OSCOPY;  Service: Endoscopy;  Laterality: N/A;   LEFT HEART CATH AND CORONARY ANGIOGRAPHY N/A 11/29/2016   Procedure: LEFT HEART CATH AND CORONARY ANGIOGRAPHY;  Surgeon: Wellington Hampshire, MD;  Location: [facility];  Service: Cardiovascular;  Laterality: N/A;   PATENT FORAMEN OVALE CLOSURE     TONSILLECTOMY     AS A CHILD   Family History  Problem Relation Age of Onset   Diabetes Sister    Ovarian cancer Sister    Psychiatric Illness Father    Heart attack Father    Diabetes Other    Hypertension Mother    Heart disease Mother    Lung cancer Paternal Aunt    Coronary artery disease Other    Diabetes Other    Hypertension Other    Hyperlipidemia Other    Cancer Paternal Uncle    Heart disease Paternal Grandmother    Heart disease Paternal Grandfather    Social History   Tobacco Use   Smoking status: Never Smoker   Smokeless tobacco: Never Used  Vaping Use   Vaping Use: Never used  Substance Use Topics   Alcohol use: Yes    Comment: occassionally  Drug use: No   Allergies  Allergen Reactions   Strawberry Extract Anaphylaxis        Dilaudid [Hydromorphone Hcl] Nausea And Vomiting    Medications: Outpatient Medications Prior to Visit  Medication Sig   ALPRAZolam (XANAX) 0.5 MG tablet TAKE 1 - 2 TABLETS BY MOUTH EVERY EVENING AT BEDTIME   Apoaequorin (PREVAGEN PO) Take 1 capsule by mouth daily.   ARTHRITIS PAIN RELIEF 650 MG CR tablet Take 650 mg by mouth every 8 (eight) hours as needed.   aspirin EC 81 MG tablet Take 81 mg by mouth daily.   cetirizine (ZYRTEC) 10 MG tablet Take 10 mg by mouth at bedtime.   clobetasol ointment (TEMOVATE) 0.93 % Apply 1 application topically 2 (two) times daily as needed (for psoriasis).   clopidogrel (PLAVIX) 75 MG tablet TAKE 1  TABLET BY MOUTH ONCE DAILY   desonide (DESOWEN) 0.05 % cream Apply 1 application topically 2 (two) times daily as needed (for psoriasis).   ezetimibe (ZETIA) 10 MG tablet TAKE 1 TABLET BY MOUTH ONCE DAILY   isosorbide mononitrate (IMDUR) 30 MG 24 hr tablet Take 1 tablet (30 mg total) by mouth daily.   LEXAPRO 20 MG tablet TAKE 1 TABLET BY MOUTH DAILY.   metoprolol tartrate (LOPRESSOR) 25 MG tablet TAKE 1 TABLET BY MOUTH TWO TIMES DAILY   nitroGLYCERIN (NITROSTAT) 0.4 MG SL tablet Place 1 tablet (0.4 mg total) under the tongue every 5 (five) minutes as needed. May repeat for up to 3 doses.   pantoprazole (PROTONIX) 20 MG tablet Take 1 tablet (20 mg total) by mouth daily.   RANEXA 1000 MG SR tablet Take 1 tablet (1,000 mg total) by mouth 2 (two) times daily.   rosuvastatin (CRESTOR) 40 MG tablet Take 1 tablet (40 mg total) by mouth daily.   No facility-administered medications prior to visit.    Review of Systems  Constitutional: Negative.   HENT: Negative.   Respiratory: Negative.   Cardiovascular: Negative.   Genitourinary: Negative.   Neurological:       Some forgetfulness, but, usually only takes a little prompting to get on track.  Psychiatric/Behavioral: Negative for agitation, confusion, decreased concentration, dysphoric mood, hallucinations, self-injury and sleep disturbance. The patient is not nervous/anxious and is not hyperactive.       Objective    BP 114/64 (BP Location: Right Arm, Patient Position: Sitting, Cuff Size: Normal)    Pulse 70    Temp 98.4 F (36.9 C) (Oral)    Wt 207 lb (93.9 kg)    SpO2 99%    BMI 33.41 kg/m     Physical Exam Constitutional:      General: He is not in acute distress.    Appearance: He is well-developed and well-nourished.  HENT:     Head: Normocephalic and atraumatic.     Right Ear: Hearing and tympanic membrane normal.     Left Ear: Hearing and tympanic membrane normal.     Nose: Nose normal.  Eyes:     General: Lids are  normal. No scleral icterus.       Right eye: No discharge.        Left eye: No discharge.     Conjunctiva/sclera: Conjunctivae normal.  Cardiovascular:     Rate and Rhythm: Normal rate and regular rhythm.     Heart sounds: Normal heart sounds.  Pulmonary:     Effort: Pulmonary effort is normal. No respiratory distress.     Breath sounds: Normal breath sounds.  Abdominal:     General: Bowel sounds are normal.     Palpations: Abdomen is soft.  Musculoskeletal:        General: Normal range of motion.     Cervical back: Neck supple.  Skin:    General: Skin is intact.     Findings: No lesion or rash.  Neurological:     Mental Status: He is alert and oriented to person, place, and time.  Psychiatric:        Mood and Affect: Mood and affect normal.        Speech: Speech normal.        Behavior: Behavior normal.        Thought Content: Thought content normal.     No results found for any visits on 03/15/20.  Assessment & Plan     1. Anxiety Feeling stable with good control from the Lexapro 20 mg qd. No suicidal thoughts or depressive reaction. No panic attacks. Will recheck labs and refill sent to his pharmacy yesterday. - CBC with Differential/Platelet - Comprehensive metabolic panel - TSH  2. Primary hypertension Well controlled with salt restrictions and Metoprolol Tartrate 25 mg BID. No dyspnea, edema or chest pains. Recheck routine labs. - CBC with Differential/Platelet - Comprehensive metabolic panel - Lipid panel - TSH  3. Sleep apnea with use of continuous positive airway pressure (CPAP) Continues to sleep very well with the use of CPAP each night. Followed by Dr. Brett Fairy (neurologist) - CBC with Differential/Platelet - Comprehensive metabolic panel - TSH  4. CVA, old, cognitive deficits Unchanged mild cognitive deficit. Describes it as needing prompting at times when he has trouble remembering. Followed once a year by neurologist.  5. Other  hyperlipidemia Followed by Dr. Rockey Situ (cardiologist) and taking Zetia 10 mg with Crestor 40 mg qd without side effects. Recheck labs and continue low fat diet. -Comprehensive metabolic panel - Lipid panel - TSH   No follow-ups on file.      I, Elisse Pennick, PA-C, have reviewed all documentation for this visit. The documentation on 03/15/20 for the exam, diagnosis, procedures, and orders are all accurate and complete.    Vernie Murders, PA-C  [employer] [phone] (phone) [phone] (fax)  [place]

## 2020-03-19 DIAGNOSIS — G4733 Obstructive sleep apnea (adult) (pediatric): Secondary | ICD-10-CM | POA: Diagnosis not present

## 2020-03-21 ENCOUNTER — Other Ambulatory Visit (HOSPITAL_COMMUNITY): Payer: Self-pay

## 2020-03-21 MED FILL — AMOXICILLIN 500 MG CAPSULE: 500 | 4 days supply | Qty: 16 | Fill #0

## 2020-03-24 DIAGNOSIS — H903 Sensorineural hearing loss, bilateral: Secondary | ICD-10-CM | POA: Diagnosis not present

## 2020-03-24 DIAGNOSIS — H9311 Tinnitus, right ear: Secondary | ICD-10-CM | POA: Diagnosis not present

## 2020-03-29 DIAGNOSIS — G4730 Sleep apnea, unspecified: Secondary | ICD-10-CM | POA: Diagnosis not present

## 2020-03-29 DIAGNOSIS — E7849 Other hyperlipidemia: Secondary | ICD-10-CM | POA: Diagnosis not present

## 2020-03-29 DIAGNOSIS — F419 Anxiety disorder, unspecified: Secondary | ICD-10-CM | POA: Diagnosis not present

## 2020-03-29 DIAGNOSIS — I10 Essential (primary) hypertension: Secondary | ICD-10-CM | POA: Diagnosis not present

## 2020-03-29 NOTE — Progress Notes (Signed)
Cardiology Office Note  Date:  03/30/2020   ID:  Azari, Kevin [name] 1955-12-21, MRN [id number]  PCP:  Tamsen Roers, PA-C   Chief Complaint  Patient presents with  . Follow-up    6 month. "doing well."     HPI:  [name] is a 64 y/o male with h/o  CVA, memory problems CAD,  atrial fib,  HTN,  hyperlipidemia,  OSA and previous CVA,   CABG with Maze and PFO closure in February 2010,   cath in 10/2008  for exertional dyspnea and CP showing 2-V CAD.  LAD 95% mid, LCX small RCA large OK.  SVG -> OM1 and OM2 was occluded. SVG-Diag was widely patent and backfilled the LAD well. LIMA - LAD was atretic. Myoview to assess LAD for ischemia with very mild reversible defect in very distal anterior wall and apex. Decision made to manage him medically, continued chest pain and cath Aug 9th 2011 with PCI of the LAD with 2 stents placed.  Cath 11/29/16 for chest pain, medical management He presents today for follow-up of his coronary artery disease  In follow-up today reports he is doing well Had covid in sept 2020 Recovered well  Active at home, building a dog kennel No regular exercise program Weight high, though stable  Not requiring nitro  chronic low-grade memory issues  On Crestor Zetia  EKG personally reviewed by myself on todays visit Shows normal sinus rhythm rate 66 bpm no significant ST-T wave changes  Other past medical history reviewed Prior stress test December 13, 2017 Exercise myocardial perfusion imaging study with no significant ischemia Low risk scan   cardiac catheterization performed by Dr. Kirke Corin August 2018 for stuttering chest pain Cath reviewed Significant underlying one-vessel coronary artery disease with patent SVG to second diagonal, patent LAD stent with mild in-stent restenosis and known occluded SVG to OM. Native RCA and left circumflex don't have obstructive disease. Normal LV systolic function and mildly elevated left ventricular end-diastolic  pressure.   PMH:   has a past medical history of Brachial plexopathy, CKD (chronic kidney disease), stage II - III (HCC), Coronary artery disease, H/O maze procedure (05/17/2008), Hyperlipidemia, Hypersomnia, organic (09/24/2012), Hypertension, Memory deficit after cerebral infarction, OSA (obstructive sleep apnea), Overweight(278.02), PAF (paroxysmal atrial fibrillation) (HCC), Patent foramen ovale, Psoriatic arthritis (HCC), Sleep apnea, Stroke (HCC), and Thrombocytopenia (HCC).  PSH:    Past Surgical History:  Procedure Laterality Date  . ACUTE PANCREATITIS  5/11  . APPENDECTOMY  1984  . CARDIAC CATHETERIZATION    . CHOLECYSTECTOMY  08/19/2009  . COLONOSCOPY WITH PROPOFOL N/A 06/07/2017   Procedure: COLONOSCOPY WITH PROPOFOL;  Surgeon: Pasty Spillers, MD;  Location: [facility] ENDOSCOPY;  Service: Endoscopy;  Laterality: N/A;  . CORONARY ARTERY BYPASS GRAFT  05/12/2008   x4 by PeterVan Trigt,MD  . ESOPHAGOGASTRODUODENOSCOPY N/A 03/03/2020   Procedure: ESOPHAGOGASTRODUODENOSCOPY (EGD);  Surgeon: Pasty Spillers, MD;  Location[facility] ENDOSCOPY;  Service: Endoscopy;  Laterality: N/A;  . LEFT HEART CATH AND CORONARY ANGIOGRAPHY N/A 11/29/2016   Procedure: LEFT HEART CATH AND CORONARY ANGIOGRAPHY;  Surgeon: Iran Ouch, MD;  Location: [facility] INVASIVE CV LAB;  Service: Cardiovascular;  Laterality: N/A;  . PATENT FORAMEN OVALE CLOSURE    . TONSILLECTOMY     AS A CHILD    Current Outpatient Medications  Medication Sig Dispense Refill  . ALPRAZolam (XANAX) 0.5 MG tablet TAKE 1 - 2 TABLETS BY MOUTH EVERY EVENING AT BEDTIME 60 tablet 1  . ARTHRITIS PAIN RELIEF 650  MG CR tablet Take 650 mg by mouth every 8 (eight) hours as needed.    Marland Kitchen aspirin EC 81 MG tablet Take 81 mg by mouth daily.    . cetirizine (ZYRTEC) 10 MG tablet Take 10 mg by mouth at bedtime.    . clobetasol ointment (TEMOVATE) [id number] % Apply 1 application topically 2 (two) times daily as needed (for psoriasis).    . clopidogrel  (PLAVIX) 75 MG tablet TAKE 1 TABLET BY MOUTH ONCE DAILY 90 tablet 3  . desonide (DESOWEN) 0.05 % cream Apply 1 application topically 2 (two) times daily as needed (for psoriasis).    . ezetimibe (ZETIA) 10 MG tablet TAKE 1 TABLET BY MOUTH ONCE DAILY 90 tablet 0  . isosorbide mononitrate (IMDUR) 30 MG 24 hr tablet Take 1 tablet (30 mg total) by mouth daily. 90 tablet 3  . LEXAPRO 20 MG tablet TAKE 1 TABLET BY MOUTH DAILY. 90 tablet 0  . metoprolol tartrate (LOPRESSOR) 25 MG tablet TAKE 1 TABLET BY MOUTH TWO TIMES DAILY 180 tablet 0  . nitroGLYCERIN (NITROSTAT) 0.4 MG SL tablet Place 1 tablet (0.4 mg total) under the tongue every 5 (five) minutes as needed. May repeat for up to 3 doses. 25 tablet 1  . pantoprazole (PROTONIX) 20 MG tablet Take 1 tablet (20 mg total) by mouth daily. 90 tablet 1  . RANEXA 1000 MG SR tablet Take 1 tablet (1,000 mg total) by mouth 2 (two) times daily. 180 tablet 3  . rosuvastatin (CRESTOR) 40 MG tablet Take 1 tablet (40 mg total) by mouth daily. 90 tablet 0   No current facility-administered medications for this visit.     Allergies:   Strawberry extract and Dilaudid [hydromorphone hcl]   Social History:  The patient  reports that he has never smoked. He has never used smokeless tobacco. He reports current alcohol use. He reports that he does not use drugs.   Family History:   family history includes Cancer in his paternal uncle; Coronary artery disease in an other family member; Diabetes in his sister and other family members; Heart attack in his father; Heart disease in his mother, paternal grandfather, and paternal grandmother; Hyperlipidemia in an other family member; Hypertension in his mother and another family member; Lung cancer in his paternal aunt; Ovarian cancer in his sister; Psychiatric Illness in his father.    Review of Systems: Review of Systems  Constitutional: Negative.   HENT: Negative.   Respiratory: Negative.   Cardiovascular: Negative.    Gastrointestinal: Negative.   Musculoskeletal: Negative.   Neurological: Negative.   Psychiatric/Behavioral: Negative.   All other systems reviewed and are negative.   PHYSICAL EXAM: VS:  BP 100/68 (BP Location: Left Arm, Patient Position: Sitting, Cuff Size: Normal)   Pulse 66   Ht 5\' 6"  (1.676 m)   Wt 207 lb (93.9 kg)   SpO2 98%   BMI 33.41 kg/m  , BMI Body mass index is 33.41 kg/m. Constitutional:  oriented to person, place, and time. No distress.  HENT:  Head: Grossly normal Eyes:  no discharge. No scleral icterus.  Neck: No JVD, no carotid bruits  Cardiovascular: Regular rate and rhythm, no murmurs appreciated Pulmonary/Chest: Clear to auscultation bilaterally, no wheezes or rails Abdominal: Soft.  no distension.  no tenderness.  Musculoskeletal: Normal range of motion Neurological:  normal muscle tone. Coordination normal. No atrophy Skin: Skin warm and dry Psychiatric: normal affect, pleasant  Recent Labs: 03/29/2020: ALT 11; BUN 19; Creatinine, Ser 1.36; Hemoglobin 12.8;  Platelets 169; Potassium 5.0; Sodium 140; TSH 3.310    Lipid Panel Lab Results  Component Value Date   CHOL 116 03/29/2020   HDL 59 03/29/2020   LDLCALC 42 03/29/2020   TRIG 73 03/29/2020      Wt Readings from Last 3 Encounters:  03/30/20 207 lb (93.9 kg)  03/15/20 207 lb (93.9 kg)  03/03/20 195 lb (88.5 kg)     ASSESSMENT AND PLAN:  Coronary artery disease of native artery of native heart with stable angina pectoris ([place]) - Plan: EKG 12-Lead Currently with no symptoms of angina. No further workup at this time. Continue current medication regimen.   Essential hypertension - Plan: EKG 12-Lead Blood pressure is well controlled on today's visit. No changes made to the medications.  Paroxysmal atrial fibrillation (HCC) Previous maze procedure ,  No arrhythmia  Hyperlipidemia Cholesterol is at goal on the current lipid regimen. No changes to the medications were made.  PATENT  FORAMEN OVALE  previous PFO closure     Total encounter time more than 25 minutes  Greater than 50% was spent in counseling and coordination of care with the patient   No orders of the defined types were placed in this encounter.    Signed, Esmond Plants, [name], Ph.D. 03/30/2020  [place], [place]

## 2020-03-30 ENCOUNTER — Ambulatory Visit (INDEPENDENT_AMBULATORY_CARE_PROVIDER_SITE_OTHER): Payer: 59 | Admitting: Cardiovascular Disease

## 2020-03-30 ENCOUNTER — Other Ambulatory Visit: Payer: Self-pay | Admitting: Cardiovascular Disease

## 2020-03-30 ENCOUNTER — Encounter: Payer: Self-pay | Admitting: Cardiovascular Disease

## 2020-03-30 ENCOUNTER — Other Ambulatory Visit: Payer: Self-pay

## 2020-03-30 VITALS — BP 100/68 | HR 66 | Ht 66.0 in | Wt 207.0 lb

## 2020-03-30 DIAGNOSIS — R413 Other amnesia: Secondary | ICD-10-CM | POA: Diagnosis not present

## 2020-03-30 DIAGNOSIS — E785 Hyperlipidemia, unspecified: Secondary | ICD-10-CM | POA: Diagnosis not present

## 2020-03-30 DIAGNOSIS — I25110 Atherosclerotic heart disease of native coronary artery with unstable angina pectoris: Secondary | ICD-10-CM

## 2020-03-30 DIAGNOSIS — G4733 Obstructive sleep apnea (adult) (pediatric): Secondary | ICD-10-CM

## 2020-03-30 DIAGNOSIS — E782 Mixed hyperlipidemia: Secondary | ICD-10-CM | POA: Diagnosis not present

## 2020-03-30 DIAGNOSIS — I10 Essential (primary) hypertension: Secondary | ICD-10-CM

## 2020-03-30 DIAGNOSIS — I639 Cerebral infarction, unspecified: Secondary | ICD-10-CM | POA: Diagnosis not present

## 2020-03-30 DIAGNOSIS — I480 Paroxysmal atrial fibrillation: Secondary | ICD-10-CM | POA: Diagnosis not present

## 2020-03-30 LAB — CBC WITH DIFFERENTIAL/PLATELET
Basophils Absolute: 0.1 10*3/uL (ref 0.0–0.2)
Basos: 1 %
EOS (ABSOLUTE): 0.5 10*3/uL — ABNORMAL HIGH (ref 0.0–0.4)
Eos: 7 %
Hematocrit: 37.4 % — ABNORMAL LOW (ref 37.5–51.0)
Hemoglobin: 12.8 g/dL — ABNORMAL LOW (ref 13.0–17.7)
Immature Grans (Abs): 0 10*3/uL (ref 0.0–0.1)
Immature Granulocytes: 0 %
Lymphocytes Absolute: 1.4 10*3/uL (ref 0.7–3.1)
Lymphs: 21 %
MCH: 32.7 pg (ref 26.6–33.0)
MCHC: 34.2 g/dL (ref 31.5–35.7)
MCV: 95 fL (ref 79–97)
Monocytes Absolute: 0.7 10*3/uL (ref 0.1–0.9)
Monocytes: 10 %
Neutrophils Absolute: 4.1 10*3/uL (ref 1.4–7.0)
Neutrophils: 61 %
Platelets: 169 10*3/uL (ref 150–450)
RBC: 3.92 x10E6/uL — ABNORMAL LOW (ref 4.14–5.80)
RDW: 12 % (ref 11.6–15.4)
WBC: 6.8 10*3/uL (ref 3.4–10.8)

## 2020-03-30 LAB — COMPREHENSIVE METABOLIC PANEL
ALT: 11 IU/L (ref 0–44)
AST: 16 IU/L (ref 0–40)
Albumin/Globulin Ratio: 2.2 (ref 1.2–2.2)
Albumin: 4.6 g/dL (ref 3.8–4.8)
Alkaline Phosphatase: 43 IU/L — ABNORMAL LOW (ref 44–121)
BUN/Creatinine Ratio: 14 (ref 10–24)
BUN: 19 mg/dL (ref 8–27)
Bilirubin Total: 0.6 mg/dL (ref 0.0–1.2)
CO2: 23 mmol/L (ref 20–29)
Calcium: 9.5 mg/dL (ref 8.6–10.2)
Chloride: 102 mmol/L (ref 96–106)
Creatinine, Ser: 1.36 mg/dL — ABNORMAL HIGH (ref 0.76–1.27)
GFR calc Af Amer: 63 mL/min/{1.73_m2} (ref >59)
GFR calc non Af Amer: 54 mL/min/{1.73_m2} — ABNORMAL LOW (ref >59)
Globulin, Total: 2.1 g/dL (ref 1.5–4.5)
Glucose: 104 mg/dL — ABNORMAL HIGH (ref 65–99)
Potassium: 5 mmol/L (ref 3.5–5.2)
Sodium: 140 mmol/L (ref 134–144)
Total Protein: 6.7 g/dL (ref 6.0–8.5)

## 2020-03-30 LAB — LIPID PANEL
Chol/HDL Ratio: 2 ratio (ref 0.0–5.0)
Cholesterol, Total: 116 mg/dL (ref 100–199)
HDL: 59 mg/dL (ref >39)
LDL Chol Calc (NIH): 42 mg/dL (ref 0–99)
Triglycerides: 73 mg/dL (ref 0–149)
VLDL Cholesterol Cal: 15 mg/dL (ref 5–40)

## 2020-03-30 LAB — TSH: TSH: 3.31 u[IU]/mL (ref 0.450–4.500)

## 2020-03-30 MED ORDER — METOPROLOL TARTRATE 25 MG PO TABS
25.0000 mg | ORAL_TABLET | Freq: Two times a day (BID) | ORAL | 3 refills | Status: DC
Start: 2020-03-30 — End: 2020-03-30

## 2020-03-30 MED ORDER — NITROGLYCERIN 0.4 MG SL SUBL
0.4000 mg | SUBLINGUAL_TABLET | SUBLINGUAL | 1 refills | Status: DC | PRN
Start: 2020-03-30 — End: 2020-03-30

## 2020-03-30 MED ORDER — EZETIMIBE 10 MG PO TABS
10.0000 mg | ORAL_TABLET | Freq: Every day | ORAL | 3 refills | Status: DC
Start: 2020-03-30 — End: 2020-03-30

## 2020-03-30 MED ORDER — ROSUVASTATIN CALCIUM 40 MG PO TABS
40.0000 mg | ORAL_TABLET | Freq: Every day | ORAL | 3 refills | Status: DC
Start: 2020-03-30 — End: 2020-03-30

## 2020-03-30 MED FILL — NITROGLYCERIN 0.4 MG TAB SL: 0.4 | 10 days supply | Qty: 25 | Fill #0

## 2020-03-30 NOTE — Patient Instructions (Signed)
Medication Instructions:  No changes  If you need a refill on your cardiac medications before your next appointment, please call your pharmacy.    Lab work: No new labs needed   If you have labs (blood work) drawn today and your tests are completely normal, you will receive your results only by: . MyChart Message (if you have MyChart) OR . A paper copy in the mail If you have any lab test that is abnormal or we need to change your treatment, we will call you to review the results.   Testing/Procedures: No new testing needed   Follow-Up: At [facility], you and your health needs are our priority.  As part of our continuing mission to provide you with exceptional heart care, we have created designated Provider Care Teams.  These Care Teams include your primary Cardiologist (physician) and Advanced Practice Providers (APPs -  Physician Assistants and Nurse Practitioners) who all work together to provide you with the care you need, when you need it.  . You will need a follow up appointment in 12 months  . Providers on your designated Care Team:   . Christopher Berge, NP . Ryan Dunn, PA-C . Jacquelyn Visser, PA-C  Any Other Special Instructions Will Be Listed Below (If Applicable).  COVID-19 Vaccine Information can be found at: [email or web address] For questions related to vaccine distribution or appointments, please email [email or web address] or call [phone].     

## 2020-04-07 ENCOUNTER — Other Ambulatory Visit: Payer: Self-pay | Admitting: Family Medicine

## 2020-04-07 DIAGNOSIS — F419 Anxiety disorder, unspecified: Secondary | ICD-10-CM

## 2020-04-07 MED FILL — RANEXA ER 1,000 MG TABLET: 1000 | 30 days supply | Qty: 60 | Fill #5

## 2020-04-07 NOTE — Telephone Encounter (Signed)
Requested medication (s) are due for refill today:   Provider to determine  Requested medication (s) are on the active medication list:   Yes  Future visit scheduled:   No   Last ordered: 12/18/2019 #60, 1 Refill  Non delegated refill   Requested Prescriptions  Pending Prescriptions Disp Refills   ALPRAZolam (XANAX) 0.5 MG tablet [Pharmacy Med Name: ALPRAZolam 0.5 MG TABS 0.5 Tablet] 60 tablet 1    Sig: TAKE 1 TO 2 TABLETS BY MOUTH EVERY EVENING AT BEDTIME      Not Delegated - Psychiatry:  Anxiolytics/Hypnotics Failed - 04/07/2020 11:45 AM      Failed - This refill cannot be delegated      Failed - Urine Drug Screen completed in last 360 days      Passed - Valid encounter within last 6 months    Recent Outpatient Visits           3 weeks ago Anxiety   [facility] Chrismon, Jodell Cipro, PA-C   6 months ago Annual physical exam   [facility], Jodell Cipro, PA-C   10 months ago Dysfunction of left eustachian tube   [employer], Jodell Cipro, PA-C   1 year ago Anxiety   [facility] Chrismon, Jodell Cipro, PA-C   1 year ago Subacute maxillary sinusitis   [facility], Jodell Cipro, [place]

## 2020-04-08 MED FILL — ALPRAZolam 0.5 MG TABS: 0.5 | 30 days supply | Qty: 60 | Fill #0

## 2020-04-18 DIAGNOSIS — G4733 Obstructive sleep apnea (adult) (pediatric): Secondary | ICD-10-CM | POA: Diagnosis not present

## 2020-05-09 MED FILL — EZETIMIBE 10 MG TABS: 10 | 90 days supply | Qty: 90 | Fill #0

## 2020-05-09 MED FILL — DESONIDE 0.05% CREAM: 0.05 | 30 days supply | Qty: 60 | Fill #0

## 2020-05-09 MED FILL — ISOSORBIDE MN ER 30 MG TAB: 30 | 90 days supply | Qty: 90 | Fill #0

## 2020-05-09 MED FILL — ROSUVASTATIN CALCIUM 40 MG: 40 | 90 days supply | Qty: 90 | Fill #0

## 2020-05-16 DIAGNOSIS — G4733 Obstructive sleep apnea (adult) (pediatric): Secondary | ICD-10-CM | POA: Diagnosis not present

## 2020-05-16 MED FILL — METOPROLOL TARTRATE 25 MG T: 25 | 90 days supply | Qty: 180 | Fill #0

## 2020-05-16 MED FILL — ALPRAZolam 0.5 MG TABS: 0.5 | 30 days supply | Qty: 60 | Fill #1

## 2020-05-16 MED FILL — RANEXA ER 1,000 MG TABLET: 1000 | 30 days supply | Qty: 60 | Fill #6

## 2020-05-30 MED FILL — PANTOPRAZOLE SOD DR 20 MG T: 20 | 90 days supply | Qty: 90 | Fill #1

## 2020-06-07 ENCOUNTER — Ambulatory Visit (INDEPENDENT_AMBULATORY_CARE_PROVIDER_SITE_OTHER): Payer: 59 | Admitting: Neurology

## 2020-06-07 ENCOUNTER — Encounter: Payer: Self-pay | Admitting: Neurology

## 2020-06-07 VITALS — BP 122/68 | HR 68 | Ht 66.0 in | Wt 204.0 lb

## 2020-06-07 DIAGNOSIS — G3184 Mild cognitive impairment, so stated: Secondary | ICD-10-CM | POA: Diagnosis not present

## 2020-06-07 DIAGNOSIS — K559 Vascular disorder of intestine, unspecified: Secondary | ICD-10-CM | POA: Diagnosis not present

## 2020-06-07 DIAGNOSIS — I25708 Atherosclerosis of coronary artery bypass graft(s), unspecified, with other forms of angina pectoris: Secondary | ICD-10-CM | POA: Diagnosis not present

## 2020-06-07 DIAGNOSIS — I480 Paroxysmal atrial fibrillation: Secondary | ICD-10-CM

## 2020-06-07 NOTE — Progress Notes (Signed)
PATIENT: Kevin [name] DOB: 1955-06-10  REASON FOR VISIT: follow up- cognitive slowing, stroke HISTORY FROM: patient here with a family member   INTERVAL HISTORY :  RV 06-07-2020- Kevin [name] returns for regular memory testing today.  He will in the future follow with Np for memory and CPAP compliance, and every third visit will be with me. He had COVID 19 in September  2020- he noted some fatigue/ he has had flu like symptoms. He believes he had COVID previously March 2020. He had returned from [facility] from a conference with [place] buyers.   How likely are you to doze in the following situations: 0 = not likely, 1 = slight chance, 2 = moderate chance, 3 = high chance  Sitting and Reading? Watching Television? Sitting inactive in a public place (theater or meeting)? Lying down in the afternoon when circumstances permit? Sitting and talking to someone? Sitting quietly after lunch without alcohol? In a car, while stopped for a few minutes in traffic? As a passenger in a car for an hour without a break?  Total = 15/ 24  Kevin [name] has been a compliant CPAP user but 100% of days and time with an average of 9 hours 42 minutes, his residual AHI is 2.6 which is an excellent resolution of his apnea.date The device pressure is on average 11.5 cm per night.  He has minor air leakage.  He does use humidifier at stage V, his [place] exam today was scored at 28 out of 30 points which is an excellent result as well and especially his word finding ability and fluency was looking better than in previous tests.  Montreal Cognitive Assessment  06/07/2020 11/21/2017 11/14/2016  Visuospatial/ Executive (0/5) 5 5 5   Naming (0/3) 3 3 3   Attention: Read list of digits (0/2) 2 2 2   Attention: Read list of letters (0/1) 1 0 1  Attention: Serial 7 subtraction starting at 100 (0/3) 3 2 3   Language: Repeat phrase (0/2) 1 2 1   Language : Fluency (0/1) 1 1 1   Abstraction (0/2) 2 2 2   Delayed Recall (0/5) 4 3 4    Orientation (0/6) 6 6 6   Total 28 26 28              RV on 11-25-2019: meanwhile 65 year-old established [facility] patient , here for a RV with.  He has been followed here for obstructive sleep apnea and has used a [employer] his durable medical equipment company is [employer].  His machine is relatively new he receives it in 2000 18 January 2018.  I will ask him to register his machine through the [employer] website so that with the current recall he can get a new machine as soon as those are available it seems that that may be many months if not a year away. So Clean has been used after  March 2021, probably May. .   In addition I asked him not to use a so clean machine any longer.  He has not seen any black particles in his water chamber and he has not noticed any changes in smell or taste from the air in the machine.  I will ask him to continue using his current machine.  Based on his home sleep test in September 2019 he had a mild his apnea at 14.9 AHI loud snoring no associated hypoxemia and normal heart rate variation.  It is feasible that he would not need CPAP should he have a body  mass index of 26 or should he have no other problems with his heart or pulmonary system.  However I assume that his apnea will be just as severe as it was then and for this reason I feel that he is not better off without CPAP for what ever reason.  His current A flex machine has been used for 100% of the days and this is a 90-day download.  He has had an average AHI, apnea hypopnea index, of 2.3/h which is excellent resolution the peak average pressure is 13.6 cmH2O his 90th percentile pressure is 11.8 cmH2O and he is using the machine over 4 hours nightly.   He has a minimum pressure of 5 maximum pressure of 15 and in between this window is also applied on expiratory pressure relief of 2 cmH2O.  Humidifier was currently on level 5 and the temperature for the tube on grade 3.  His hours of usage have been very  regular and very highly compliant and he has had the same breathing volume, has committed to a same temperature for almost 2 years.  Epworth Sleepiness score now 12 points. FSS: N/A.     Interval history : 64 year old male Caucasian patient who is now no longer on Eliquis for atrial fibrillation but Plavix as an antiplatelet agent.  He had no recent angina concerns, he is usually a compliant CPAP user but over the early months of February contracted influenza and had to wait for 2 weeks to be able to tolerate positive airway pressure again.  He has resumed the use of the machine now there is an autotitrator between 5 and 15 cmH2O was 2 cm EPR, a ramp time of 10 minutes and his average pressure 90% of the time is 9.5 cm water.   When Kevin [name] is using his machine he uses it for about 8 hours and 30 minutes.  However given the recent respiratory infect he has only had a compliance for 23% for February 2020.  He had used it during travels to [facility] in January 2020, he went for a business convention. He owns a Programme researcher, broadcasting/film/video. 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 His spouse feels that his performance is limited and work routines are too Film/video editor for him.   Kevin [name] is a 65 year old male patient with a history of memory disturbance following multiple embolic strokes in 9833. A fibrillation history- but Eliquis was d/c 05/2016.   He reports he has significant daytime sleepiness and has noticed being more tired throughout the day.  He states when he was diagnosed with sleep apnea he lost weight and after he was retested by HST was told he did not have apnea anymore ( [facility] sleep study at [facility] - OSA, retrognathia, sinusitis, rhinitis). His Epworth SS endorsed in 2017 at 16/24 and FSS at 52/63 points, both very high. HST did not confirm  apnea to be any longer present. He states that since that time he has gained weight and has begun snoring again. He also reports tinnitus in the right ear, present since his stroke.   BMI: 33.1  STUDY RESULTS HST :  Total Recording Time: 8 hours 50 minutes, valid for 7 h 33 min.  Total Apnea/Hypopnea Index (AHI): 14.9 /h; RDI: 15.8 /h. suggested REM AHI was 21.8/h Average Oxygen Saturation:  94 %, Lowest  Oxygen Saturation: 88 %  Total Time in Oxygen Saturation below 89 %: 0.2 minutes.  Heart Rate:  between 46 and 70 bpm.  IMPRESSION: Mild Obstructive sleep apnea confirmed at AHI 14.9/h. REM AHI of 21.8/h with moderate loud snoring (see RDI). No associated hypoxemia, normal heart rate variation.   RECOMMENDATION: I would restart CPAP (I can order a new machine for him) and urge him to lose weight again.  This is a milder apnea form but even mild apnea left untreated can cause irregular heartbeats, meaning a return to uncontrolled atrial fibrillation.  I certify that I have reviewed the raw data recording prior to the issuance of this report in accordance with the standards of the American Academy of Sleep Medicine (AASM). Larey Seat, [name]  05-20-2017       Kevin [name] is seen here for a yearly follow up-  Today 11/14/2016, his chief complaint in the past has been cognitive slowing rather than forgetfulness. His wife also feels that he has become more and more forgetful but he does very well on our standard office neuropsychology tests. He scored 30 out of 30 on a Mini-Mental Status Examination by Lillia Corporal, his Montral cognitive assessment test documented 28 out of 30 points. He cannot remember if he ate or spoke to a customer ,  difficulties with beginning and finishing projects- all beginning with his strokes in 2007.   Kevin [name] is a 65 year old male with a history of stroke and cognitive slowing. He returns today for follow-up. The patient feels that he has remained stable. He continues to  notice trouble with his memory although he feels it is not any worse. 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 He remains on aspirin. His primary care and cardiologist managing his blood pressure and cholesterol. He is not diabetic. He does not smoke. He denies any new neurological symptoms. He returns today for an evaluation.  HISTORY Kevin [name]  Is a 65 year old male with a history of strokes and residual cognitive slowing. He returns today for follow-up. The patient feels that his cognition has remained the same. He does not feel that things have gotten worse. 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 He is followed by a cardiologist who manages his blood pressure and cholesterol. He denies any additional strokelike symptoms. He also states that his fatigue has improved over the years. He states that he has learned how to manage it better. The patient continues to take aspirin as secondary stroke prevention.  Patient states that his short-term memory is most affected. However he states that he can normally recall things if prompted. He denies any new medical issues. He returns today for an evaluation.  HISTORY 09/24/13: Kevin [name] is a 65 y.o. male here as a follow up visit, after last year's referral for transition of care from Dr. Erling Cruz. HST in 2013 negative for OSA, Epworth score is 18, FSS 43 points, hypersomnia persistent. 2007 two strokes , in March affecting the left and in May 2007 affecting right hemisphere. He has typical right brain deficits based on his neuropsychological testing. 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 His spouse feels that his performance is limited and work routines are too Film/video editor for him. He needs to re check  his own work, all this takes extra time, he cannot multitask.   Last visit note: Patient was originally referred for hypersomnia by Dr Love12-20-13, afterhe reported being noncompliant with CPAP. At the time, I ordered a home sleep test which returned as an AHI of less than 5 and basically would not qualify the patient for CPAP use or doesn't indicate the need of CPAP. Kevin. Homeyer has a past medical history however him admitted and made understandable why apnea was considered for him to be a diagnosis he had a right brain stroke in May 2007, but it of coronary artery disease and atrial fibrillation in the past, hypertension, hyperlipidemia, psoriasis, obstructive sleep apnea was diagnosed in 2012 and at the time he was titrated to 7 cm water pressure at this was done at [facility]. Again meanwhile his apnea index is less than 5 and therefore not longer in need of CPAP treatment. Would have been present isn't elevated or excessive daytime sleepiness with Epworth score is 17/24 at the time of his home sleep test as ordered by Dr. Erling Cruz, and also today he again and [name] an Epworth sleepiness score of 13 point less than in last December but still slightly elevated.Goes to bed at 10:30 PM, falls asleep very quickly and arises in the morning at 7:30. He may have one or none bathroom break. And intermittent slowing only the patient does not snore himself awake.The patient reports that if he has a chance to take a nap he will and it'll take between 2-1/2 hours. He seems to be at his sleep he is between 11 AM and 2 was 3 PM. The patient also falls regularly asleep in front of the TV at night at about 6:30 PM . He states that he cannot stay awake until the news.I was able today to review all sleep study if the whole sleep test, the previous polysomnography from 11-24-10 and CPAP interpretation from 9-22,012. Dr. Morene Antu is the referring physician for all 3 studies. The patient was no able to lose weight , was 170 pounds in 2012 now 188 lbs.  His past  medical history is summarized here in short:  Patient suffered a left brain stroke in March 2007 at age 59, another stroke in the right posterior cerebral artery on 08-21-2005. 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 He underwent a coronary artery disease 4 vessel bypass surgery and patch closure by Dr. Nils Pyle on 2-15 2010. At the time he had complained of intermittent diplopia as a residual from his stroke symptoms, he also had cognitive issues. A neuropsychological battery testing in September 2010 showed that his cognitive abilities were well in the average range and his executive function was intact. But he had deficits for complex visual attention, shifting attention and multitasking multiple stimuli applied simultaneously did result in confusion. His memory findings were typical of of right brain dysfunction . The patient is a Armed forces operational officer ( flooring) and noted that he becomes more fatigued and probably less attentive with the ongoing day at work.  Toward the end of the day he would often develop a tinnitus which he feels distracts him . He feels that: Naps in the morning or early afternoon can help him to sustain productivity through the day, he feels less fatigued less distractible and more able to concentrate.  REVIEW OF SYSTEMS: Out of a complete 14 system review of symptoms, the patient  complains only of the following symptoms, and all other reviewed systems are negative.  Subjective cognitive decline.   How likely are you to doze in the following situations: 0 = not likely, 1 = slight chance, 2 = moderate chance, 3 = high chance  Sitting and Reading? Watching Television? Sitting inactive in a public place (theater or meeting)? Lying down in the afternoon when circumstances permit? Sitting and talking to someone? Sitting quietly after lunch without alcohol? In a car, while stopped for a few  minutes in traffic? As a passenger in a car for an hour without a break?  Total = 13/24     ALLERGIES: Allergies  Allergen Reactions  . Strawberry Extract Anaphylaxis       . Dilaudid [Hydromorphone Hcl] Nausea And Vomiting    HOME MEDICATIONS: Outpatient Medications Prior to Visit  Medication Sig Dispense Refill  . ALPRAZolam (XANAX) 0.5 MG tablet TAKE 1 TO 2 TABLETS BY MOUTH EVERY EVENING AT BEDTIME 60 tablet 1  . ARTHRITIS PAIN RELIEF 650 MG CR tablet Take 650 mg by mouth every 8 (eight) hours as needed.    Marland Kitchen aspirin EC 81 MG tablet Take 81 mg by mouth daily.    . cetirizine (ZYRTEC) 10 MG tablet Take 10 mg by mouth at bedtime.    . clobetasol ointment (TEMOVATE) 4.81 % Apply 1 application topically 2 (two) times daily as needed (for psoriasis).    . clopidogrel (PLAVIX) 75 MG tablet TAKE 1 TABLET BY MOUTH ONCE DAILY 90 tablet 3  . desonide (DESOWEN) 0.05 % cream Apply 1 application topically 2 (two) times daily as needed (for psoriasis).    . ezetimibe (ZETIA) 10 MG tablet Take 1 tablet (10 mg total) by mouth daily. 90 tablet 3  . LEXAPRO 20 MG tablet TAKE 1 TABLET BY MOUTH DAILY. 90 tablet 0  . metoprolol tartrate (LOPRESSOR) 25 MG tablet Take 1 tablet (25 mg total) by mouth 2 (two) times daily. 180 tablet 3  . nitroGLYCERIN (NITROSTAT) 0.4 MG SL tablet Place 1 tablet (0.4 mg total) under the tongue every 5 (five) minutes as needed. May repeat for up to 3 doses. 25 tablet 1  . pantoprazole (PROTONIX) 20 MG tablet Take 1 tablet (20 mg total) by mouth daily. 90 tablet 1  . RANEXA 1000 MG SR tablet Take 1 tablet (1,000 mg total) by mouth 2 (two) times daily. 180 tablet 3  . rosuvastatin (CRESTOR) 40 MG tablet Take 1 tablet (40 mg total) by mouth daily. 90 tablet 3  . amoxicillin (AMOXIL) 500 MG capsule SMARTSIG:4 Capsule(s) By Mouth (Patient not taking: Reported on 06/07/2020)    . isosorbide mononitrate (IMDUR) 30 MG 24 hr tablet Take 1 tablet (30 mg total) by mouth daily. 90  tablet 3   No facility-administered medications prior to visit.    PAST MEDICAL HISTORY: Past Medical History:  Diagnosis Date  . Brachial plexopathy   . CKD (chronic kidney disease), stage II - III ([place])   . Coronary artery disease    a. s/p 4 vessel CABG in 05/2008 w/ LIMA-LAD, SVG-Diag, sequential SVG-OM1/OM2; b. s/p PCI/DES x 2 to LAD in 10/2009; c. LHC 03/2013 stable disease and patent LAD stents; d. 10/2016 Cath: LM min irregs, LAD 20p, 65m ISR, D2 80ost, LCX small, min irregs, OM1/2/3 min irregs, RCA/RPDA/RPAV min irregs, RPL1/2 nl RPL3 min irregs, VG->D2 nl, VG->OM1->OM2 100, EF 55-65%.  . H/O maze procedure 05/17/2008   a. @ time of CABG  . Hyperlipidemia   .  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   . Hypertension   . Memory deficit after cerebral infarction   . OSA (obstructive sleep apnea)   . Overweight(278.02)   . PAF (paroxysmal atrial fibrillation) ([place])    a. s/p Maze 05/2008, previously on Eliquis->discontinued 05/2016; c. CHADS2VASc => 4 (HTN, stroke x 2, vascular disease)  . Patent foramen ovale    a. 05/2008 s/p closure @ time of CABG.  . Psoriatic arthritis ([place])   . Sleep apnea   . Stroke [facility])    a. 07/2005 right brain; b. 05/2005 left brain; c. Resultant memory deficits.  . Thrombocytopenia ([place])     PAST SURGICAL HISTORY: Past Surgical History:  Procedure Laterality Date  . ACUTE PANCREATITIS  5/11  . APPENDECTOMY  1984  . CARDIAC CATHETERIZATION    . CHOLECYSTECTOMY  08/19/2009  . COLONOSCOPY WITH PROPOFOL N/A 06/07/2017   Procedure: COLONOSCOPY WITH PROPOFOL;  Surgeon: Virgel Manifold, MD;  Location: [facility] ENDOSCOPY;  Service: Endoscopy;  Laterality: N/A;  . CORONARY ARTERY BYPASS GRAFT  05/12/2008   x4 by PeterVan Trigt,MD  . ESOPHAGOGASTRODUODENOSCOPY N/A 03/03/2020   Procedure: ESOPHAGOGASTRODUODENOSCOPY (EGD);  Surgeon: Virgel Manifold, MD;  Location: [facility] ENDOSCOPY;  Service: Endoscopy;  Laterality: N/A;  .  LEFT HEART CATH AND CORONARY ANGIOGRAPHY N/A 11/29/2016   Procedure: LEFT HEART CATH AND CORONARY ANGIOGRAPHY;  Surgeon: Wellington Hampshire, MD;  Location: [place] CV LAB;  Service: Cardiovascular;  Laterality: N/A;  . PATENT FORAMEN OVALE CLOSURE    . TONSILLECTOMY     AS A CHILD    FAMILY HISTORY: Family History  Problem Relation Age of Onset  . Diabetes Sister   . Ovarian cancer Sister   . Psychiatric Illness Father   . Heart attack Father   . Diabetes Other   . Hypertension Mother   . Heart disease Mother   . Lung cancer Paternal Aunt   . Coronary artery disease Other   . Diabetes Other   . Hypertension Other   . Hyperlipidemia Other   . Cancer Paternal Uncle   . Heart disease Paternal Grandmother   . Heart disease Paternal Grandfather     SOCIAL HISTORY: Social History   Socioeconomic History  . Marital status: Married    Spouse name: [name]  . Number of children: 3  . Years of education: Not on file  . Highest education level: Not on file  Occupational History  . Occupation: Full time    Employer: [employer]  Tobacco Use  . Smoking status: Never Smoker  . Smokeless tobacco: Never Used  Vaping Use  . Vaping Use: Never used  Substance and Sexual Activity  . Alcohol use: Yes    Comment: occassionally  . Drug use: No  . Sexual activity: Not on file  Other Topics Concern  . 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 Went to high school. He has worked at his present job for 22 years. He has been married for 34 years. They have 3 children. 2 of his daughters live in [place]. He drinks less than two cups of caffeine per day. He does not use  tobacco or recreational drugs. He has rare alcohol intake. Lives with wife and daughter      OSA diagnosed at [facility] , had his  sleep studies there  reviewed them all in detail today he   has retrognathia, sinusitis,  rhinitis      His ESS remains very high  at 16 and FSS at 52 . His falls assessment tool score is 5.   nasonex, refitted mask, the recent  HST failed to document that  apnea is present at all. education about REM BD and EDS, narcolepsy , cataplexy.  45 minutes.    Social Determinants of Health   Financial Resource Strain: Not on file  Food Insecurity: Not on file  Transportation Needs: Not on file  Physical Activity: Not on file  Stress: Not on file  Social Connections: Not on file  Intimate Partner Violence: Not on file    PHYSICAL EXAM  Vitals:   06/07/20 1525  BP: 122/68  Pulse: 68  Weight: 204 lb (92.5 kg)  Height: 5\' 6"  (1.676 m)   Body mass index is 32.93 kg/m.  MMSE - Mini Mental State Exam 11/14/2016 11/03/2015  Orientation to time 5 4  Orientation to Place 5 5  Registration 3 3  Attention/ Calculation 5 5  Recall 3 2  Language- name 2 objects 2 2  Language- repeat 1 1  Language- follow 3 step command 3 3  Language- read & follow direction 1 1  Write a sentence 1 1  Copy design 1 1  Total score 30 28    Generalized: Well developed, in no acute distress  Neck circumference : 16 "   Neurological examination   Mentation: Alert oriented to time, place, history taking. Follows all commands speech and language fluent. Mild dysphonia.  He reports memory loss progression . Montreal Cognitive Assessment  06/07/2020 11/21/2017 11/14/2016  Visuospatial/ Executive (0/5) 5 5 5   Naming (0/3) 3 3 3   Attention: Read list of digits (0/2) 2 2 2   Attention: Read list of letters (0/1) 1 0 1  Attention: Serial 7 subtraction starting at 100 (0/3) 3 2 3   Language: Repeat phrase (0/2) 1 2 1   Language : Fluency (0/1) 1 1 1   Abstraction (0/2) 2 2 2   Delayed Recall (0/5) 4 3 4   Orientation (0/6) 6 6 6   Total 28 26 28     Cranial nerve: no loss of smell or taste. Pupils were equal round reactive to light. Uvula/  tongue midline. Tinnitus- " in my right  head "- " its not in my ear, it's in the head. "  Hearing the tuning fork vibration decreased on the right, versus left.  Head turning and shoulder shrug were normal and symmetric. Motor: right sided tone elevated, mildly, no pronation, no drop.   Sensory: Sensory testing is intact to soft touch on all 4 extremities. Coordination: finger-nose maneuver performed  bilaterally, with target tremor.  Action tremor.  Reflexes: Deep tendon reflexes are symmetric, brisk patella reflex- right over left.  DIAGNOSTIC DATA (LABS, IMAGING, TESTING) - I reviewed patient records, labs, notes, testing and imaging myself where available.   ASSESSMENT AND PLAN 65 y.o. year old male  has a past medical history of Brachial plexopathy, CKD (chronic kidney disease), stage II - III ([place]), Coronary artery disease, H/O maze procedure (05/17/2008), Hyperlipidemia, Hypersomnia, organic (09/24/2012), Hypertension, Memory deficit after cerebral infarction, OSA (obstructive sleep apnea), Overweight(278.02), PAF (paroxysmal atrial fibrillation) ([place]), Patent foramen ovale, Psoriatic arthritis ([place]), Sleep apnea, Stroke ([place]), and Thrombocytopenia ([place]). here as a former stroke patient of Dr Bernardo Heater , later Dr. Clydene Fake with:  1. Strokes in 2007 (followed by Dr Erling Cruz until 2013) with residual  right brain syndrome.  A) Cognitive slowing-  Related to strokes in onset . He feels there is  progression. MMSE 28/ 30. Lets do [place] next time.  No decreased cognitive function in comparison to the results over 2 years plus. 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  Right arm myoclonus, twitches, jerking.   while apnea free in 2016 he now has tested positive again -2019  on CPAP.    Highly compliant. Needs to register his DREAMSTATION>  Is on recall/  Continue on Plavix  for stroke prevention. Continue CPAP until replaced. We will continue to monitor yearly- MOCA.   Larey Seat, MD     06/07/2020, 3:30 PM [facility] [address], [place], [place] [address] [phone]

## 2020-06-20 ENCOUNTER — Other Ambulatory Visit: Payer: Self-pay | Admitting: Family Medicine

## 2020-06-20 ENCOUNTER — Other Ambulatory Visit (HOSPITAL_BASED_OUTPATIENT_CLINIC_OR_DEPARTMENT_OTHER): Payer: Self-pay

## 2020-06-20 MED FILL — CLOPIDOGREL 75 MG TABLET: 75 | 90 days supply | Qty: 90 | Fill #3

## 2020-06-20 MED FILL — LEXAPRO 20 MG TABLET: 20 | 90 days supply | Qty: 90 | Fill #0

## 2020-06-20 MED FILL — RANEXA ER 1,000 MG TABLET: 1000 | 30 days supply | Qty: 60 | Fill #7

## 2020-07-06 DIAGNOSIS — H2513 Age-related nuclear cataract, bilateral: Secondary | ICD-10-CM | POA: Diagnosis not present

## 2020-07-11 ENCOUNTER — Other Ambulatory Visit: Payer: Self-pay

## 2020-07-11 DIAGNOSIS — L570 Actinic keratosis: Secondary | ICD-10-CM | POA: Diagnosis not present

## 2020-07-11 DIAGNOSIS — D2261 Melanocytic nevi of right upper limb, including shoulder: Secondary | ICD-10-CM | POA: Diagnosis not present

## 2020-07-11 DIAGNOSIS — X32XXXA Exposure to sunlight, initial encounter: Secondary | ICD-10-CM | POA: Diagnosis not present

## 2020-07-11 DIAGNOSIS — D2262 Melanocytic nevi of left upper limb, including shoulder: Secondary | ICD-10-CM | POA: Diagnosis not present

## 2020-07-11 DIAGNOSIS — D2271 Melanocytic nevi of right lower limb, including hip: Secondary | ICD-10-CM | POA: Diagnosis not present

## 2020-07-11 DIAGNOSIS — Z85828 Personal history of other malignant neoplasm of skin: Secondary | ICD-10-CM | POA: Diagnosis not present

## 2020-07-11 DIAGNOSIS — L400 Psoriasis vulgaris: Secondary | ICD-10-CM | POA: Diagnosis not present

## 2020-07-11 MED ORDER — TACROLIMUS 0.1 % EX OINT
TOPICAL_OINTMENT | CUTANEOUS | 2 refills | Status: DC
  Filled 2020-07-11: qty 30, 20d supply, fill #0

## 2020-07-12 ENCOUNTER — Other Ambulatory Visit: Payer: Self-pay

## 2020-07-12 ENCOUNTER — Other Ambulatory Visit (HOSPITAL_COMMUNITY): Payer: Self-pay

## 2020-07-12 MED FILL — Ranolazine Tab ER 12HR 1000 MG: ORAL | 90 days supply | Qty: 180 | Fill #0 | Status: CN

## 2020-07-13 ENCOUNTER — Other Ambulatory Visit (HOSPITAL_COMMUNITY): Payer: Self-pay

## 2020-07-15 ENCOUNTER — Other Ambulatory Visit (HOSPITAL_COMMUNITY): Payer: Self-pay

## 2020-07-15 MED FILL — Ranolazine Tab ER 12HR 1000 MG: ORAL | 90 days supply | Qty: 180 | Fill #0 | Status: AC

## 2020-07-18 ENCOUNTER — Other Ambulatory Visit (HOSPITAL_COMMUNITY): Payer: Self-pay

## 2020-08-08 ENCOUNTER — Other Ambulatory Visit: Payer: Self-pay | Admitting: Cardiovascular Disease

## 2020-08-08 ENCOUNTER — Other Ambulatory Visit: Payer: Self-pay | Admitting: Family Medicine

## 2020-08-08 ENCOUNTER — Other Ambulatory Visit (HOSPITAL_COMMUNITY): Payer: Self-pay

## 2020-08-08 DIAGNOSIS — F419 Anxiety disorder, unspecified: Secondary | ICD-10-CM

## 2020-08-08 DIAGNOSIS — G4733 Obstructive sleep apnea (adult) (pediatric): Secondary | ICD-10-CM | POA: Diagnosis not present

## 2020-08-08 MED ORDER — ISOSORBIDE MONONITRATE ER 30 MG PO TB24
ORAL_TABLET | Freq: Every day | ORAL | 1 refills | Status: DC
  Filled 2020-08-08: qty 90, 90d supply, fill #0
  Filled 2020-10-31: qty 90, 90d supply, fill #1

## 2020-08-08 MED ORDER — ALPRAZOLAM 0.5 MG PO TABS
ORAL_TABLET | ORAL | 1 refills | Status: DC
  Filled 2020-08-08: qty 60, 30d supply, fill #0
  Filled 2020-10-05: qty 60, 30d supply, fill #1

## 2020-08-08 MED FILL — Ezetimibe Tab 10 MG: ORAL | 90 days supply | Qty: 90 | Fill #0 | Status: AC

## 2020-08-08 MED FILL — Rosuvastatin Calcium Tab 40 MG: ORAL | 90 days supply | Qty: 90 | Fill #0 | Status: AC

## 2020-08-08 NOTE — Telephone Encounter (Signed)
Rx request sent to pharmacy.  

## 2020-08-08 NOTE — Telephone Encounter (Signed)
Requested medications are due for refill today.  yes  Requested medications are on the active medications list.  yes  Last refill. 04/07/2020  Future visit scheduled.   no  Notes to clinic.  Medication not delegated.

## 2020-08-15 ENCOUNTER — Other Ambulatory Visit (HOSPITAL_COMMUNITY): Payer: Self-pay

## 2020-08-15 MED FILL — Metoprolol Tartrate Tab 25 MG: ORAL | 90 days supply | Qty: 180 | Fill #0 | Status: AC

## 2020-08-24 ENCOUNTER — Emergency Department: Payer: 59

## 2020-08-24 ENCOUNTER — Telehealth: Payer: Self-pay

## 2020-08-24 ENCOUNTER — Emergency Department
Admission: EM | Admit: 2020-08-24 | Discharge: 2020-08-24 | Disposition: A | Discharge status: Home / Self Care | Payer: 59 | Attending: Emergency Medicine | Admitting: Emergency Medicine

## 2020-08-24 ENCOUNTER — Other Ambulatory Visit: Payer: Self-pay

## 2020-08-24 DIAGNOSIS — I10 Essential (primary) hypertension: Secondary | ICD-10-CM | POA: Diagnosis not present

## 2020-08-24 DIAGNOSIS — Z79899 Other long term (current) drug therapy: Secondary | ICD-10-CM | POA: Insufficient documentation

## 2020-08-24 DIAGNOSIS — R1032 Left lower quadrant pain: Secondary | ICD-10-CM | POA: Diagnosis present

## 2020-08-24 DIAGNOSIS — K5792 Diverticulitis of intestine, part unspecified, without perforation or abscess without bleeding: Secondary | ICD-10-CM

## 2020-08-24 DIAGNOSIS — I700 Atherosclerosis of aorta: Secondary | ICD-10-CM | POA: Diagnosis not present

## 2020-08-24 DIAGNOSIS — K5732 Diverticulitis of large intestine without perforation or abscess without bleeding: Secondary | ICD-10-CM | POA: Diagnosis not present

## 2020-08-24 DIAGNOSIS — Z7902 Long term (current) use of antithrombotics/antiplatelets: Secondary | ICD-10-CM | POA: Insufficient documentation

## 2020-08-24 DIAGNOSIS — I129 Hypertensive chronic kidney disease with stage 1 through stage 4 chronic kidney disease, or unspecified chronic kidney disease: Secondary | ICD-10-CM | POA: Insufficient documentation

## 2020-08-24 DIAGNOSIS — Z951 Presence of aortocoronary bypass graft: Secondary | ICD-10-CM | POA: Insufficient documentation

## 2020-08-24 DIAGNOSIS — N183 Chronic kidney disease, stage 3 unspecified: Secondary | ICD-10-CM | POA: Insufficient documentation

## 2020-08-24 DIAGNOSIS — Z7982 Long term (current) use of aspirin: Secondary | ICD-10-CM | POA: Insufficient documentation

## 2020-08-24 DIAGNOSIS — I25118 Atherosclerotic heart disease of native coronary artery with other forms of angina pectoris: Secondary | ICD-10-CM | POA: Insufficient documentation

## 2020-08-24 DIAGNOSIS — Z9049 Acquired absence of other specified parts of digestive tract: Secondary | ICD-10-CM | POA: Diagnosis not present

## 2020-08-24 DIAGNOSIS — K5730 Diverticulosis of large intestine without perforation or abscess without bleeding: Secondary | ICD-10-CM | POA: Diagnosis not present

## 2020-08-24 LAB — COMPREHENSIVE METABOLIC PANEL
ALT: 12 U/L (ref 0–44)
AST: 16 U/L (ref 15–41)
Albumin: 4.2 g/dL (ref 3.5–5.0)
Alkaline Phosphatase: 45 U/L (ref 38–126)
Anion gap: 10 (ref 5–15)
BUN: 16 mg/dL (ref 8–23)
CO2: 26 mmol/L (ref 22–32)
Calcium: 8.8 mg/dL — ABNORMAL LOW (ref 8.9–10.3)
Chloride: 101 mmol/L (ref 98–111)
Creatinine, Ser: 1.23 mg/dL (ref 0.61–1.24)
GFR, Estimated: >60 mL/min (ref >60)
Glucose, Bld: 106 mg/dL — ABNORMAL HIGH (ref 70–99)
Potassium: 4.1 mmol/L (ref 3.5–5.1)
Sodium: 137 mmol/L (ref 135–145)
Total Bilirubin: 1.2 mg/dL (ref 0.3–1.2)
Total Protein: 7.4 g/dL (ref 6.5–8.1)

## 2020-08-24 LAB — CBC
HCT: 38.7 % — ABNORMAL LOW (ref 39.0–52.0)
Hemoglobin: 13.4 g/dL (ref 13.0–17.0)
MCH: 32.7 pg (ref 26.0–34.0)
MCHC: 34.6 g/dL (ref 30.0–36.0)
MCV: 94.4 fL (ref 80.0–100.0)
Platelets: 178 10*3/uL (ref 150–400)
RBC: 4.1 MIL/uL — ABNORMAL LOW (ref 4.22–5.81)
RDW: 12.1 % (ref 11.5–15.5)
WBC: 11.3 10*3/uL — ABNORMAL HIGH (ref 4.0–10.5)
nRBC: 0 % (ref 0.0–0.2)

## 2020-08-24 LAB — LIPASE, BLOOD: Lipase: 36 U/L (ref 11–51)

## 2020-08-24 MED ORDER — AMOXICILLIN-POT CLAVULANATE 875-125 MG PO TABS
1.0000 | ORAL_TABLET | Freq: Once | ORAL | Status: AC
  Administered 2020-08-24: 1 via ORAL
  Filled 2020-08-24: qty 1

## 2020-08-24 MED ORDER — AMOXICILLIN-POT CLAVULANATE 875-125 MG PO TABS
1.0000 | ORAL_TABLET | Freq: Two times a day (BID) | ORAL | 0 refills | Status: AC
  Filled 2020-08-24: qty 10, 5d supply, fill #0

## 2020-08-24 NOTE — ED Triage Notes (Signed)
C/O LLQ abdominal pain x 2 days.  Fever last night.  Has history of diverticulitis.

## 2020-08-24 NOTE — Telephone Encounter (Signed)
Call to patient and he is being seen in the ED

## 2020-08-24 NOTE — ED Triage Notes (Signed)
Pt c/o LLQ pain with nausea for the past 2 days denies vomiting or diarrhea

## 2020-08-24 NOTE — Discharge Instructions (Signed)
As we discussed, please finish a short course of Augmentin antibiotics for the next 5 days to treat diverticulitis.  Use Tylenol for pain and fevers.  Up to 1000 mg per dose, up to 4 times per day.  Do not take more than 4000 mg of Tylenol/acetaminophen within 24 hours..  Follow-up with your GI doctor to discuss if you need a colonoscopy sooner than 5 years out from your last one.  If you develop any worsening symptoms despite these measures, please return to the ED.

## 2020-08-24 NOTE — ED Notes (Signed)
ED Provider at bedside. 

## 2020-08-24 NOTE — Telephone Encounter (Signed)
Copied from [place] [phone]. Topic: General - Other >> Aug 24, 2020  8:07 AM Leward Quan A wrote: Reason for CRM: Patient wide called in to inform Simona Huh Chrismon that for the pass few days he have been having issues with abdominal pain mainly on the left side of his body and fever on 08/23/20. Temp was 99.0 on this AM Would like to be seen today if possible. Please call patient at Ph# [phone]

## 2020-08-24 NOTE — ED Provider Notes (Signed)
[facility] Emergency Department Provider Note ____________________________________________   Event Date/Time   First MD Initiated Contact with Patient 08/24/20 [phone]     (approximate)  I have reviewed the triage vital signs and the nursing notes.  HISTORY  Chief Complaint Abdominal Pain   HPI Kevin [name] is a 65 y.o. malewho presents to the ED for evaluation of LLQ abd pain, fever.   Chart review indicates history of CAD s/p CABG, maze and PFO closure, HTN, HLD.  On DAPT with Plavix.  2019 colonoscopy reviewed with diverticulosis sigmoid, descending and ascending colon.  Patient presents to the ED via POV, accompanied by his wife who provides some additional history, for evaluation of 2 days of LLQ abdominal pain, stool changes and subjective fevers last night.  He has difficulty describing his stool changes, reports that they seem different.  Reports associated nausea without emesis.  Reports sweatiness last night and subjective fevers, check an oral temperature this morning and having temperature of 99 degrees.  Currently reporting mild pain, but "something is not right."  Denies my offer for medications for pain or nausea.  Past Medical History:  Diagnosis Date  . Brachial plexopathy   . CKD (chronic kidney disease), stage II - III ([place])   . Coronary artery disease    a. s/p 4 vessel CABG in 05/2008 w/ LIMA-LAD, SVG-Diag, sequential SVG-OM1/OM2; b. s/p PCI/DES x 2 to LAD in 10/2009; c. LHC 03/2013 stable disease and patent LAD stents; d. 10/2016 Cath: LM min irregs, LAD 20p, 69m ISR, D2 80ost, LCX small, min irregs, OM1/2/3 min irregs, RCA/RPDA/RPAV min irregs, RPL1/2 nl RPL3 min irregs, VG->D2 nl, VG->OM1->OM2 100, EF 55-65%.  . H/O maze procedure 05/17/2008   a. @ time of CABG  . Hyperlipidemia   . 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   . Hypertension   . Memory deficit after cerebral infarction   . OSA (obstructive  sleep apnea)   . Overweight(278.02)   . PAF (paroxysmal atrial fibrillation) ([place])    a. s/p Maze 05/2008, previously on Eliquis->discontinued 05/2016; c. CHADS2VASc => 4 (HTN, stroke x 2, vascular disease)  . Patent foramen ovale    a. 05/2008 s/p closure @ time of CABG.  . Psoriatic arthritis ([place])   . Sleep apnea   . Stroke [facility])    a. 07/2005 right brain; b. 05/2005 left brain; c. Resultant memory deficits.  . Thrombocytopenia [facility])     Patient Active Problem List   Diagnosis Date Noted  . Mesenteric ischemia ([place]) 06/07/2020  . Gastroesophageal reflux disease   . Esophageal dysphagia   . 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[place])   . Sleep apnea with use of continuous positive airway pressure (CPAP) 11/25/2019  . Lumbar spondylosis 08/20/2019  . Long-term memory impairment 06/04/2018  . Psoriasis 08/16/2017  . Diverticulosis of large intestine without diverticulitis   . MCI (mild cognitive impairment) 11/14/2016  . Obstructive sleep apnea 09/22/2015  . BP (high blood pressure) 09/22/2015  . Allergic rhinitis 09/22/2015  . Anxiety 09/22/2015  . 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[place]) 05/14/2014  . 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 Atherosclerosis of coronary artery bypass graft of native heart with stable angina pectoris ([place]) 05/13/2009  . Hyperlipidemia 08/11/2008  . Obesity 08/11/2008  . Coronary atherosclerosis 08/11/2008  . Paroxysmal atrial fibrillation ([place]) 08/11/2008    Past Surgical History:  Procedure Laterality Date  . ACUTE PANCREATITIS  5/11  . APPENDECTOMY  1984  . CARDIAC CATHETERIZATION    . CHOLECYSTECTOMY  08/19/2009  . COLONOSCOPY WITH PROPOFOL N/A 06/07/2017   Procedure: COLONOSCOPY WITH PROPOFOL;  Surgeon: Virgel Manifold, MD;  Location: [facility] ENDOSCOPY;  Service: Endoscopy;  Laterality: N/A;  . CORONARY ARTERY BYPASS GRAFT  05/12/2008   x4 by PeterVan Trigt,MD  . ESOPHAGOGASTRODUODENOSCOPY N/A 03/03/2020   Procedure:  ESOPHAGOGASTRODUODENOSCOPY (EGD);  Surgeon: Virgel Manifold, MD;  Location: [facility] ENDOSCOPY;  Service: Endoscopy;  Laterality: N/A;  . LEFT HEART CATH AND CORONARY ANGIOGRAPHY N/A 11/29/2016   Procedure: LEFT HEART CATH AND CORONARY ANGIOGRAPHY;  Surgeon: Wellington Hampshire, MD;  Location: [place] CV LAB;  Service: Cardiovascular;  Laterality: N/A;  . PATENT FORAMEN OVALE CLOSURE    . TONSILLECTOMY     AS A CHILD    Prior to Admission medications   Medication Sig Start Date End Date Taking? Authorizing Provider  amoxicillin-clavulanate (AUGMENTIN) 875-125 MG tablet Take 1 tablet by mouth 2 (two) times daily for 5 days. 08/24/20 08/29/20 Yes Vladimir Crofts, MD  ALPRAZolam Duanne Moron) 0.5 MG tablet TAKE 1 TO 2 TABLETS BY MOUTH EVERY EVENING AT BEDTIME 08/08/20 02/04/21  Chrismon, Vickki Muff, PA-C  amoxicillin (AMOXIL) 500 MG capsule SMARTSIG:4 Capsule(s) By Mouth Patient not taking: Reported on 06/07/2020 03/21/20   [provider]  amoxicillin (AMOXIL) 500 MG capsule TAKE 4 CAPSULES BY MOUTH 1 HOUR PRIOR TO DENTAL APPT 03/21/20 03/21/21    ARTHRITIS PAIN RELIEF 650 MG CR tablet Take 650 mg by mouth every 8 (eight) hours as needed. 08/05/19   [provider]  aspirin EC 81 MG tablet Take 81 mg by mouth daily.    [provider]  cetirizine (ZYRTEC) 10 MG tablet Take 10 mg by mouth at bedtime.    [provider]  clobetasol ointment (TEMOVATE) 0.81 % Apply 1 application topically 2 (two) times daily as needed (for psoriasis).    [provider]  clopidogrel (PLAVIX) 75 MG tablet TAKE 1 TABLET BY MOUTH ONCE DAILY 09/02/19 09/01/20  Minna Merritts, MD  desonide (DESOWEN) 0.05 % cream Apply 1 application topically 2 (two) times daily as needed (for psoriasis).    [provider]  ezetimibe (ZETIA) 10 MG tablet TAKE 1 TABLET BY MOUTH ONCE A DAY 03/30/20 03/30/21  Minna Merritts, MD  influenza vaccine adjuvanted (FLUAD) 0.5 ML injection USE AS DIRECTED  02/03/20 02/02/21  Carlyle Basques, MD  isosorbide mononitrate (IMDUR) 30 MG 24 hr tablet TAKE 1 TABLET BY MOUTH ONCE DAILY 08/08/20 08/08/21  Gollan, Kathlene November, MD  LEXAPRO 20 MG tablet TAKE 1 TABLET BY MOUTH DAILY. 06/20/20 06/20/21  Chrismon, Vickki Muff, PA-C  metoprolol tartrate (LOPRESSOR) 25 MG tablet TAKE 1 TABLET BY MOUTH 2 TIMES DAILY 03/30/20 03/30/21  Minna Merritts, MD  nitroGLYCERIN (NITROSTAT) 0.4 MG SL tablet PLACE 1 TABLET (0.4 MG TOTAL) UNDER THE TONGUE EVERY 5 (FIVE) MINUTES AS NEEDED. MAY REPEAT FOR UP TO 3 DOSES. IF NO RELIEF WITH 1ST DOSE, CALL 911. 03/30/20 03/30/21  Minna Merritts, MD  pantoprazole (PROTONIX) 20 MG tablet TAKE 1 TABLET BY MOUTH ONCE A DAY 03/04/20 03/04/21  Virgel Manifold, MD  RANEXA 1000 MG SR tablet TAKE 1 TABLET BY MOUTH TWO TIMES DAILY 11/11/19 11/10/20  Theora Gianotti, NP  rosuvastatin (CRESTOR) 40 MG tablet TAKE 1 TABLET BY MOUTH ONCE A DAY 03/30/20 03/30/21  Minna Merritts, MD  tacrolimus (PROTOPIC) 0.1 % ointment Apply twice daily to  affected areas as needed 07/11/20       Allergies Strawberry extract and Dilaudid [hydromorphone hcl]  Family History  Problem Relation Age of Onset  . Diabetes Sister   . Ovarian cancer Sister   . Psychiatric Illness Father   . Heart attack Father   . Diabetes Other   . Hypertension Mother   . Heart disease Mother   . Lung cancer Paternal Aunt   . Coronary artery disease Other   . Diabetes Other   . Hypertension Other   . Hyperlipidemia Other   . Cancer Paternal Uncle   . Heart disease Paternal Grandmother   . Heart disease Paternal Grandfather     Social History Social History   Tobacco Use  . Smoking status: Never Smoker  . Smokeless tobacco: Never Used  Vaping Use  . Vaping Use: Never used  Substance Use Topics  . Alcohol use: Yes    Comment: occassionally  . Drug use: No    Review of Systems  Constitutional: Positive for subjective fever/chills Eyes: No visual changes. ENT:  No sore throat. Cardiovascular: Denies chest pain. Respiratory: Denies shortness of breath. Gastrointestinal: Positive for abdominal pain, nausea and stool changes  no vomiting.   Genitourinary: Negative for dysuria. Musculoskeletal: Negative for back pain. Skin: Negative for rash. Neurological: Negative for headaches, focal weakness or numbness. ____________________________________________  PHYSICAL EXAM:  VITAL SIGNS: Vitals:   08/24/20 1212 08/24/20 1213  BP:  114/73  Pulse:  63  Resp:  16  Temp: 98.6 F (37 C)   SpO2:  96%    Constitutional: Alert and oriented. Well appearing and in no acute distress.  Pleasant and conversational in full sentences. Eyes: Conjunctivae are normal. PERRL. EOMI. Head: Atraumatic. Nose: No congestion/rhinnorhea. Mouth/Throat: Mucous membranes are moist.  Oropharynx non-erythematous. Neck: No stridor. No cervical spine tenderness to palpation. Cardiovascular: Normal rate, regular rhythm. Grossly normal heart sounds.  Good peripheral circulation. Respiratory: Normal respiratory effort.  No retractions. Lungs CTAB. Gastrointestinal: Soft , nondistended. No CVA tenderness. LLQ, suprapubic and periumbilical tenderness to palpation without peritoneal features.  Upper and right-sided abdomen are benign. Musculoskeletal: No lower extremity tenderness nor edema.  No joint effusions. No signs of acute trauma. Neurologic:  Normal speech and language. No gross focal neurologic deficits are appreciated. No gait instability noted. Skin:  Skin is warm, dry and intact. No rash noted. Psychiatric: Mood and affect are normal. Speech and behavior are normal. ____________________________________________   LABS (all labs ordered are listed, but only abnormal results are displayed)  Labs Reviewed  CBC - Abnormal; Notable for the following components:      Result Value   WBC 11.3 (*)    RBC 4.10 (*)    HCT 38.7 (*)    All other components within normal limits   COMPREHENSIVE METABOLIC PANEL - Abnormal; Notable for the following components:   Glucose, Bld 106 (*)    Calcium 8.8 (*)    All other components within normal limits  LIPASE, BLOOD  URINALYSIS, COMPLETE (UACMP) WITH MICROSCOPIC   ____________________________________________  12 Lead EKG   ____________________________________________  RADIOLOGY  ED MD interpretation: CT abdomen/pelvis reviewed by me with descending colonic stranding without evidence of abscess suggestive of diverticulitis  Official radiology report(s): CT ABDOMEN PELVIS WO CONTRAST  Result Date: 08/24/2020 CLINICAL DATA:  Left lower quadrant abdominal pain for the past 2 days. History of diverticulitis. EXAM: CT ABDOMEN AND PELVIS WITHOUT CONTRAST TECHNIQUE: Multidetector CT imaging of the abdomen and pelvis was performed following  the standard protocol without IV contrast. COMPARISON:  09/25/2017 FINDINGS: The lack of intravenous contrast limits the ability to evaluate solid abdominal organs. Lower chest: Limited visualization of the lower thorax is negative for focal airspace opacity or pleural effusion. Cardiomegaly. Coronary artery calcifications. No pericardial effusion. Hepatobiliary: Normal hepatic contour. Post cholecystectomy. No ascites. Pancreas: Normal noncontrast appearance of the pancreas. Spleen: Normal noncontrast appearance of the spleen. Adrenals/Urinary Tract: Normal noncontrast appearance of the bilateral kidneys. No renal stones. No renal stones are seen along the expected location of either ureter or the urinary bladder. There is a very minimal amount of symmetric likely age and body habitus related perinephric stranding. No urinary obstruction. Normal noncontrast appearance the bilateral adrenal glands. Normal noncontrast appearance of the urinary bladder given degree of distention. Stomach/Bowel: Rather extensive colonic diverticulosis. There is ill-defined stranding surrounding a prominent diverticuli  within the mid descending colon (axial image 50, series 2; coronal image 48, series 5) worrisome for an area of acute uncomplicated diverticulitis. No evidence of perforation or definable/drainable fluid collection. Moderate colonic stool burden without evidence of enteric obstruction. Normal noncontrast appearance of the terminal ileum. The appendix is not visualized, though there is no pericecal inflammatory change. Vascular/Lymphatic: Minimal to moderate amount of calcified atherosclerotic plaque within a normal caliber abdominal aorta. No bulky retroperitoneal, mesenteric, pelvic or inguinal lymphadenopathy on this noncontrast examination. Reproductive: Normal noncontrast within the pelvic cul-de-sac appearance of the prostate gland. No free fluid. Other: Regional soft tissues appear normal. Musculoskeletal: No acute or aggressive osseous abnormalities. Mild-to-moderate multilevel lumbar spine DDD, worse at L5-S1 with disc space height loss, endplate irregularity and small posteriorly directed disc osteophyte complex at this location. Stigmata of dish within the lower thoracic spine. IMPRESSION: 1. Findings compatible with acute uncomplicated diverticulitis involving the mid descending colon. No evidence of perforation or definable/drainable fluid collection. If not recently performed, further evaluation with colonoscopy after the resolution of acute symptoms is recommended to exclude the presence of an underlying mass or lesion. 2. Moderate colonic stool burden without evidence of enteric obstruction. 3. Coronary calcifications.  Aortic Atherosclerosis (ICD10-I70.0). Electronically Signed   By: Sandi Mariscal [name]   On: 08/24/2020 10:50    ____________________________________________   PROCEDURES and INTERVENTIONS  Procedure(s) performed (including Critical Care):  .1-3 Lead EKG Interpretation Performed by: Vladimir Crofts, MD Authorized by: Vladimir Crofts, MD     Interpretation: normal     ECG rate:   60   ECG rate assessment: normal     Rhythm: sinus rhythm     Ectopy: none     Conduction: normal      Medications  amoxicillin-clavulanate (AUGMENTIN) 875-125 MG per tablet 1 tablet (1 tablet Oral Given 08/24/20 1210)    ____________________________________________   MDM / ED COURSE   Pleasant 65 year old male with history of diverticulosis and diverticulitis presents to the ED with 2 days of LLQ abdominal pain and stool changes, with evidence of uncomplicated diverticulitis amenable to outpatient management.  Normal vitals on room air.  Exam with LLQ tenderness to palpation without peritoneal features and otherwise well-appearing patient.  Blood work with mild leukocytosis, otherwise unremarkable.  CT imaging demonstrates evidence of diverticulitis without abscess, perforation, ureteral stones or further complications.  Will discharge with a short course of Augmentin and have him follow-up with his GI physician.  Return precautions for the ED were discussed.  Clinical Course as of 08/24/20 1307  Wed Aug 24, 2020  1225 Reassessed.  Patient reports feeling well.  Having to redraw  some labs.  We discussed outpatient management as long as these are reassuring.  He is agreeable. [DS]    Clinical Course User Index [DS] Vladimir Crofts, MD    ____________________________________________   FINAL CLINICAL IMPRESSION(S) / ED DIAGNOSES  Final diagnoses:  Diverticulitis     ED Discharge Orders         Ordered    amoxicillin-clavulanate (AUGMENTIN) 875-125 MG tablet  2 times daily        08/24/20 1305           Nemesio Castrillon   Note:  This document was prepared using Dragon voice recognition software and may include unintentional dictation errors.   Vladimir Crofts, MD 08/24/20 [phone]

## 2020-09-01 ENCOUNTER — Other Ambulatory Visit: Payer: Self-pay

## 2020-09-01 ENCOUNTER — Other Ambulatory Visit
Admission: RE | Admit: 2020-09-01 | Discharge: 2020-09-01 | Disposition: A | Discharge status: Home / Self Care | Payer: 59 | Source: Ambulatory Visit | Attending: Gastroenterology | Admitting: Gastroenterology

## 2020-09-01 ENCOUNTER — Ambulatory Visit
Admission: RE | Admit: 2020-09-01 | Discharge: 2020-09-01 | Disposition: A | Discharge status: Home / Self Care | Payer: 59 | Source: Ambulatory Visit | Attending: Gastroenterology | Admitting: Gastroenterology

## 2020-09-01 ENCOUNTER — Ambulatory Visit (INDEPENDENT_AMBULATORY_CARE_PROVIDER_SITE_OTHER): Payer: 59 | Admitting: Gastroenterology

## 2020-09-01 ENCOUNTER — Telehealth: Payer: Self-pay

## 2020-09-01 VITALS — BP 102/68 | HR 56 | Wt 201.2 lb

## 2020-09-01 DIAGNOSIS — R350 Frequency of micturition: Secondary | ICD-10-CM

## 2020-09-01 DIAGNOSIS — K5732 Diverticulitis of large intestine without perforation or abscess without bleeding: Secondary | ICD-10-CM | POA: Insufficient documentation

## 2020-09-01 DIAGNOSIS — R109 Unspecified abdominal pain: Secondary | ICD-10-CM | POA: Diagnosis not present

## 2020-09-01 DIAGNOSIS — K760 Fatty (change of) liver, not elsewhere classified: Secondary | ICD-10-CM | POA: Diagnosis not present

## 2020-09-01 HISTORY — DX: Unspecified malignant neoplasm of skin, unspecified: C44.90

## 2020-09-01 LAB — URINALYSIS, ROUTINE W REFLEX MICROSCOPIC
Bilirubin Urine: NEGATIVE
Glucose, UA: NEGATIVE mg/dL
Hgb urine dipstick: NEGATIVE
Ketones, ur: NEGATIVE mg/dL
Leukocytes,Ua: NEGATIVE
Nitrite: NEGATIVE
Protein, ur: NEGATIVE mg/dL
Specific Gravity, Urine: 1.025 (ref 1.005–1.030)
pH: 6 (ref 5.0–8.0)

## 2020-09-01 LAB — CBC
HCT: 36.8 % — ABNORMAL LOW (ref 39.0–52.0)
Hemoglobin: 12.7 g/dL — ABNORMAL LOW (ref 13.0–17.0)
MCH: 32.4 pg (ref 26.0–34.0)
MCHC: 34.5 g/dL (ref 30.0–36.0)
MCV: 93.9 fL (ref 80.0–100.0)
Platelets: 203 10*3/uL (ref 150–400)
RBC: 3.92 MIL/uL — ABNORMAL LOW (ref 4.22–5.81)
RDW: 12 % (ref 11.5–15.5)
WBC: 7.3 10*3/uL (ref 4.0–10.5)
nRBC: 0 % (ref 0.0–0.2)

## 2020-09-01 MED ORDER — IOHEXOL 300 MG/ML  SOLN
100.0000 mL | Freq: Once | INTRAMUSCULAR | Status: AC | PRN
  Administered 2020-09-01: 100 mL via INTRAVENOUS

## 2020-09-01 NOTE — Telephone Encounter (Signed)
Spoke to [place] to see if pt can be here at 11:30 today per Dr Michele Mcalpine request to fit pt in... she stated she would have to call back.Marland KitchenMarland Kitchen

## 2020-09-01 NOTE — Telephone Encounter (Signed)
-----   Message from Virgel Manifold, MD sent at 09/01/2020 12:26 PM EDT ----- Please refer to [place] [facility] for recurrent diverticulitis

## 2020-09-01 NOTE — Telephone Encounter (Signed)
Referral form, notes and demographics faxed to [facility]

## 2020-09-01 NOTE — Progress Notes (Signed)
[name], MD [address]  [place]  [place], [place] [address]  Main: [phone]  Fax: [phone]   Primary Care Physician: Margo Common, PA-C   Chief complaint: Abdominal pain  HPI: Kevin [name] is a 65 y.o. male here reporting abdominal pain.  Patient started having lower left lower quadrant abdominal pain recently and since it did not improve he went to the ER and was diagnosed with uncomplicated diverticulitis via CT scan.  Patient was prescribed Augmentin which he completed 3 to 4 days ago and states symptoms did improve toward the end of the treatment.  However, he states over the last 24 hours, symptoms have started to return.  He specifically states that he does not have pain per se, but more bloating and cramping in the same area in the left lower quadrant and he states that the symptoms of diverticulitis started this way last week as well.  No nausea or vomiting, no fever or chills in the last 48 hours.  Did have a low-grade temp when he went to the ER before.  He has previously had diverticulitis in 2018 as well  He also notes increased urinary urgency when the symptoms first started but states urine test was not done in the ER even though it was discussed at that time.  Urinary urgency has reoccurred as of yesterday as well.  Current Outpatient Medications  Medication Sig Dispense Refill  . ALPRAZolam (XANAX) 0.5 MG tablet TAKE 1 TO 2 TABLETS BY MOUTH EVERY EVENING AT BEDTIME 60 tablet 1  . amoxicillin (AMOXIL) 500 MG capsule     . amoxicillin (AMOXIL) 500 MG capsule TAKE 4 CAPSULES BY MOUTH 1 HOUR PRIOR TO DENTAL APPT 16 capsule 0  . ARTHRITIS PAIN RELIEF 650 MG CR tablet Take 650 mg by mouth every 8 (eight) hours as needed.    Marland Kitchen aspirin EC 81 MG tablet Take 81 mg by mouth daily.    . cetirizine (ZYRTEC) 10 MG tablet Take 10 mg by mouth at bedtime.    . clobetasol ointment (TEMOVATE) 5.05 % Apply 1 application topically 2 (two) times daily as  needed (for psoriasis).    . clopidogrel (PLAVIX) 75 MG tablet TAKE 1 TABLET BY MOUTH ONCE DAILY 90 tablet 3  . desonide (DESOWEN) 0.05 % cream Apply 1 application topically 2 (two) times daily as needed (for psoriasis).    . ezetimibe (ZETIA) 10 MG tablet TAKE 1 TABLET BY MOUTH ONCE A DAY 90 tablet 3  . influenza vaccine adjuvanted (FLUAD) 0.5 ML injection USE AS DIRECTED .5 mL 0  . isosorbide mononitrate (IMDUR) 30 MG 24 hr tablet TAKE 1 TABLET BY MOUTH ONCE DAILY 90 tablet 1  . LEXAPRO 20 MG tablet TAKE 1 TABLET BY MOUTH DAILY. 90 tablet 0  . metoprolol tartrate (LOPRESSOR) 25 MG tablet TAKE 1 TABLET BY MOUTH 2 TIMES DAILY 180 tablet 3  . nitroGLYCERIN (NITROSTAT) 0.4 MG SL tablet PLACE 1 TABLET (0.4 MG TOTAL) UNDER THE TONGUE EVERY 5 (FIVE) MINUTES AS NEEDED. MAY REPEAT FOR UP TO 3 DOSES. IF NO RELIEF WITH 1ST DOSE, CALL 911. 25 tablet 1  . pantoprazole (PROTONIX) 20 MG tablet TAKE 1 TABLET BY MOUTH ONCE A DAY 90 tablet 1  . RANEXA 1000 MG SR tablet TAKE 1 TABLET BY MOUTH TWO TIMES DAILY 180 tablet 3  . rosuvastatin (CRESTOR) 40 MG tablet TAKE 1 TABLET BY MOUTH ONCE A DAY 90 tablet 3  . tacrolimus (PROTOPIC) 0.1 %  ointment Apply twice daily to affected areas as needed 30 g 2   No current facility-administered medications for this visit.    Allergies as of 09/01/2020 - Review Complete 08/24/2020  Allergen Reaction Noted  . Strawberry extract Anaphylaxis 12/04/2011  . Dilaudid [hydromorphone hcl] Nausea And Vomiting 06/21/2010    ROS:  General: Negative for anorexia, weight loss, fever, chills, fatigue, weakness. ENT: Negative for hoarseness, difficulty swallowing , nasal congestion. CV: Negative for chest pain, angina, palpitations, dyspnea on exertion, peripheral edema.  Respiratory: Negative for dyspnea at rest, dyspnea on exertion, cough, sputum, wheezing.  GI: See history of present illness. GU:  Negative for dysuria, hematuria, urinary incontinence, urinary frequency, nocturnal  urination.  Endo: Negative for unusual weight change.    Physical Examination:   BP 102/68   Pulse (!) 56   Wt 201 lb 3.2 oz (91.3 kg)   BMI 32.47 kg/m   General: Well-nourished, well-developed in no acute distress.  Eyes: No icterus. Conjunctivae pink. Mouth: Oropharyngeal mucosa moist and pink , no lesions erythema or exudate. Neck: Supple, Trachea midline Abdomen: Bowel sounds are normal, nontender, nondistended, no hepatosplenomegaly or masses, no abdominal bruits or hernia , no rebound or guarding.   Extremities: No lower extremity edema. No clubbing or deformities. Neuro: Alert and oriented x 3.  Grossly intact. Skin: Warm and dry, no jaundice.   Psych: Alert and cooperative, normal mood and affect.   Labs: CMP     Component Value Date/Time   NA 137 08/24/2020 1218   NA 140 03/29/2020 1027   NA 141 03/18/2013 1739   K 4.1 08/24/2020 1218   K 4.0 03/18/2013 1739   CL 101 08/24/2020 1218   CL 107 03/18/2013 1739   CO2 26 08/24/2020 1218   CO2 30 03/18/2013 1739   GLUCOSE 106 (H) 08/24/2020 1218   GLUCOSE 79 03/18/2013 1739   BUN 16 08/24/2020 1218   BUN 19 03/29/2020 1027   BUN 23 (H) 03/18/2013 1739   CREATININE 1.23 08/24/2020 1218   CREATININE 1.50 (H) 12/18/2013 1008   CALCIUM 8.8 (L) 08/24/2020 1218   CALCIUM 9.2 03/18/2013 1739   PROT 7.4 08/24/2020 1218   PROT 6.7 03/29/2020 1027   ALBUMIN 4.2 08/24/2020 1218   ALBUMIN 4.6 03/29/2020 1027   AST 16 08/24/2020 1218   ALT 12 08/24/2020 1218   ALKPHOS 45 08/24/2020 1218   BILITOT 1.2 08/24/2020 1218   BILITOT 0.6 03/29/2020 1027   GFRNONAA >60 08/24/2020 1218   GFRNONAA 50 (L) 12/18/2013 1008   GFRAA 63 03/29/2020 1027   GFRAA 58 (L) 12/18/2013 1008   Lab Results  Component Value Date   WBC 11.3 (H) 08/24/2020   HGB 13.4 08/24/2020   HCT 38.7 (L) 08/24/2020   MCV 94.4 08/24/2020   PLT 178 08/24/2020    Imaging Studies: CT ABDOMEN PELVIS WO CONTRAST  Result Date: 08/24/2020 CLINICAL DATA:   Left lower quadrant abdominal pain for the past 2 days. History of diverticulitis. EXAM: CT ABDOMEN AND PELVIS WITHOUT CONTRAST TECHNIQUE: Multidetector CT imaging of the abdomen and pelvis was performed following the standard protocol without IV contrast. COMPARISON:  09/25/2017 FINDINGS: The lack of intravenous contrast limits the ability to evaluate solid abdominal organs. Lower chest: Limited visualization of the lower thorax is negative for focal airspace opacity or pleural effusion. Cardiomegaly. Coronary artery calcifications. No pericardial effusion. Hepatobiliary: Normal hepatic contour. Post cholecystectomy. No ascites. Pancreas: Normal noncontrast appearance of the pancreas. Spleen: Normal noncontrast appearance of the spleen.  Adrenals/Urinary Tract: Normal noncontrast appearance of the bilateral kidneys. No renal stones. No renal stones are seen along the expected location of either ureter or the urinary bladder. There is a very minimal amount of symmetric likely age and body habitus related perinephric stranding. No urinary obstruction. Normal noncontrast appearance the bilateral adrenal glands. Normal noncontrast appearance of the urinary bladder given degree of distention. Stomach/Bowel: Rather extensive colonic diverticulosis. There is ill-defined stranding surrounding a prominent diverticuli within the mid descending colon (axial image 50, series 2; coronal image 48, series 5) worrisome for an area of acute uncomplicated diverticulitis. No evidence of perforation or definable/drainable fluid collection. Moderate colonic stool burden without evidence of enteric obstruction. Normal noncontrast appearance of the terminal ileum. The appendix is not visualized, though there is no pericecal inflammatory change. Vascular/Lymphatic: Minimal to moderate amount of calcified atherosclerotic plaque within a normal caliber abdominal aorta. No bulky retroperitoneal, mesenteric, pelvic or inguinal lymphadenopathy  on this noncontrast examination. Reproductive: Normal noncontrast within the pelvic cul-de-sac appearance of the prostate gland. No free fluid. Other: Regional soft tissues appear normal. Musculoskeletal: No acute or aggressive osseous abnormalities. Mild-to-moderate multilevel lumbar spine DDD, worse at L5-S1 with disc space height loss, endplate irregularity and small posteriorly directed disc osteophyte complex at this location. Stigmata of dish within the lower thoracic spine. IMPRESSION: 1. Findings compatible with acute uncomplicated diverticulitis involving the mid descending colon. No evidence of perforation or definable/drainable fluid collection. If not recently performed, further evaluation with colonoscopy after the resolution of acute symptoms is recommended to exclude the presence of an underlying mass or lesion. 2. Moderate colonic stool burden without evidence of enteric obstruction. 3. Coronary calcifications.  Aortic Atherosclerosis (ICD10-I70.0). Electronically Signed   By: Sandi Mariscal [name]   On: 08/24/2020 10:50    Assessment and Plan:   Kevin [name] is a 65 y.o. y/o male with recent diverticulitis on CT scan, completed course of antibiotics, here reporting recurrence of similar symptoms starting yesterday despite completion of antibiotics 3 to 4 days ago  Given recurrent symptoms symptoms that led him to go to the ER last week, we will repeat CT scan stat today to evaluate if things have worsened  Patient may need inpatient treatment if CT scan shows any worsening  We will also obtain urine test  Check CBC to evaluate if white count is elevated  If symptoms acutely worsen, patient was advised to go to the ER   Will refer to surgery as well given second diverticulitis episode  Dr [name]

## 2020-09-01 NOTE — Telephone Encounter (Signed)
Patient is having abdominal pain left side that is constant. He was seen in the ED for Diverticulitis on 08/24/2020. He was given 5 day of antibiotics and finished them on Monday. He began to have symptoms again on Tuesday.

## 2020-09-01 NOTE — Telephone Encounter (Signed)
Copied from [place] [id number]. Topic: General - Other >> Aug 31, 2020  3:22 PM Yvette Rack wrote: Reason for CRM: Pt wife requests documentation of patient's physical done on 09-14-19

## 2020-09-06 NOTE — Addendum Note (Signed)
Addended by: Lurlean Nanny on: 09/06/2020 12:07 PM   Modules accepted: Orders

## 2020-09-08 ENCOUNTER — Ambulatory Visit (INDEPENDENT_AMBULATORY_CARE_PROVIDER_SITE_OTHER): Payer: 59 | Admitting: Podiatry

## 2020-09-08 ENCOUNTER — Other Ambulatory Visit: Payer: Self-pay

## 2020-09-08 ENCOUNTER — Ambulatory Visit (INDEPENDENT_AMBULATORY_CARE_PROVIDER_SITE_OTHER): Payer: 59

## 2020-09-08 DIAGNOSIS — M25871 Other specified joint disorders, right ankle and foot: Secondary | ICD-10-CM

## 2020-09-08 DIAGNOSIS — Q667 Congenital pes cavus, unspecified foot: Secondary | ICD-10-CM | POA: Diagnosis not present

## 2020-09-08 DIAGNOSIS — M722 Plantar fascial fibromatosis: Secondary | ICD-10-CM | POA: Diagnosis not present

## 2020-09-08 NOTE — Progress Notes (Signed)
dg 

## 2020-09-08 NOTE — Telephone Encounter (Signed)
LMTBC. Patient can come to the office and sign a release to get office visit note or pt can get OV note from his MyChart.

## 2020-09-08 NOTE — Telephone Encounter (Signed)
Pts wife found what she needed via MyChart, no further action is needed.

## 2020-09-14 ENCOUNTER — Encounter: Payer: Self-pay | Admitting: Podiatry

## 2020-09-14 NOTE — Progress Notes (Signed)
Subjective:  Patient ID: Kevin [name], male    DOB: [birth year],  MRN: [id number]  Chief Complaint  Patient presents with   Foot Pain    Right foot pain under the right hallux more so in the ball of the foot     64 y.o. male presents with the above complaint.  Patient presents with complaint of right foot pain at first metatarsophalangeal joint.  He states his pain however quite some time.  He states his foot acutely pain keeps getting worse as more he ambulates.  He has not been seen anyone else prior to seeing me.  His pain is dull achy in nature.  Hurts with ambulation.  No good treatment options that he has tried.  He denies seeing anyone else prior to seeing me.   Review of Systems: Negative except as noted in the HPI. Denies N/V/F/Ch.  Past Medical History:  Diagnosis Date   Brachial plexopathy    CKD (chronic kidney disease), stage II - III ([facility])    Coronary artery disease    a. s/p 4 vessel CABG in 05/2008 w/ LIMA-LAD, SVG-Diag, sequential SVG-OM1/OM2; b. s/p PCI/DES x 2 to LAD in 10/2009; c. LHC 03/2013 stable disease and patent LAD stents; d. 10/2016 Cath: LM min irregs, LAD 20p, 22m ISR, D2 80ost, LCX small, min irregs, OM1/2/3 min irregs, RCA/RPDA/RPAV min irregs, RPL1/2 nl RPL3 min irregs, VG->D2 nl, VG->OM1->OM2 100, EF 55-65%.   H/O maze procedure 05/17/2008   a. 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    Hypertension    Memory deficit after cerebral infarction    OSA (obstructive sleep apnea)    Overweight(278.02)    PAF (paroxysmal atrial fibrillation) ([facility])    a. s/p Maze 05/2008, previously on Eliquis->discontinued 05/2016; c. CHADS2VASc => 4 (HTN, stroke x 2, vascular disease)   Patent foramen ovale    a. 05/2008 s/p closure @ time of CABG.   Psoriatic arthritis ([facility])    Skin cancer    Sleep apnea    Stroke [facility])    a. 07/2005 right brain; b. 05/2005 left brain; c. Resultant memory deficits.    Thrombocytopenia ([facility])     Current Outpatient Medications:    ALPRAZolam (XANAX) 0.5 MG tablet, TAKE 1 TO 2 TABLETS BY MOUTH EVERY EVENING AT BEDTIME, Disp: 60 tablet, Rfl: 1   amoxicillin (AMOXIL) 500 MG capsule, , Disp: , Rfl:    amoxicillin (AMOXIL) 500 MG capsule, TAKE 4 CAPSULES BY MOUTH 1 HOUR PRIOR TO DENTAL APPT, Disp: 16 capsule, Rfl: 0   ARTHRITIS PAIN RELIEF 650 MG CR tablet, Take 650 mg by mouth every 8 (eight) hours as needed., Disp: , Rfl:    aspirin EC 81 MG tablet, Take 81 mg by mouth daily., Disp: , Rfl:    cetirizine (ZYRTEC) 10 MG tablet, Take 10 mg by mouth at bedtime., Disp: , Rfl:    clobetasol ointment (TEMOVATE) 9.24 %, Apply 1 application topically 2 (two) times daily as needed (for psoriasis)., Disp: , Rfl:    desonide (DESOWEN) 0.05 % cream, Apply 1 application topically 2 (two) times daily as needed (for psoriasis)., Disp: , Rfl:    ezetimibe (ZETIA) 10 MG tablet, TAKE 1 TABLET BY MOUTH ONCE A DAY, Disp: 90 tablet, Rfl: 3   influenza vaccine adjuvanted (FLUAD) 0.5 ML injection, USE AS DIRECTED, Disp: .5 mL, Rfl: 0   isosorbide mononitrate (  IMDUR) 30 MG 24 hr tablet, TAKE 1 TABLET BY MOUTH ONCE DAILY, Disp: 90 tablet, Rfl: 1   LEXAPRO 20 MG tablet, TAKE 1 TABLET BY MOUTH DAILY., Disp: 90 tablet, Rfl: 0   metoprolol tartrate (LOPRESSOR) 25 MG tablet, TAKE 1 TABLET BY MOUTH 2 TIMES DAILY, Disp: 180 tablet, Rfl: 3   nitroGLYCERIN (NITROSTAT) 0.4 MG SL tablet, PLACE 1 TABLET (0.4 MG TOTAL) UNDER THE TONGUE EVERY 5 (FIVE) MINUTES AS NEEDED. MAY REPEAT FOR UP TO 3 DOSES. IF NO RELIEF WITH 1ST DOSE, CALL 911., Disp: 25 tablet, Rfl: 1   pantoprazole (PROTONIX) 20 MG tablet, TAKE 1 TABLET BY MOUTH ONCE A DAY, Disp: 90 tablet, Rfl: 1   RANEXA 1000 MG SR tablet, TAKE 1 TABLET BY MOUTH TWO TIMES DAILY, Disp: 180 tablet, Rfl: 3   rosuvastatin (CRESTOR) 40 MG tablet, TAKE 1 TABLET BY MOUTH ONCE A DAY, Disp: 90 tablet, Rfl: 3   tacrolimus (PROTOPIC) 0.1 % ointment, Apply twice daily  to affected areas as needed, Disp: 30 g, Rfl: 2  Social History   Tobacco Use  Smoking Status Never  Smokeless Tobacco Never    Allergies  Allergen Reactions   Strawberry Extract Anaphylaxis        Dilaudid [Hydromorphone Hcl] Nausea And Vomiting   Objective:  There were no vitals filed for this visit. There is no height or weight on file to calculate BMI. Constitutional Well developed. Well nourished.  Vascular Dorsalis pedis pulses palpable bilaterally. Posterior tibial pulses palpable bilaterally. Capillary refill normal to all digits.  No cyanosis or clubbing noted. Pedal hair growth normal.  Neurologic Normal speech. Oriented to person, place, and time. Epicritic sensation to light touch grossly present bilaterally.  Dermatologic Nails well groomed and normal in appearance. No open wounds. No skin lesions.  Orthopedic: Pain on palpation noted to the right sesamoidal complex.  No pain with range of motion of the first metatarsophalangeal joint.  No deep intra-articular first MPJ pain noted.  Negative flexor extensor tendinitis noted.   Radiographs:3 views of skeletally mature adult right foot: No fractures noted.  Sesamoidal complex intact.  No bony abnormalities identified.  Pes cavus foot structure noted with decreasing calcaneal clinician angle bullet hole sinus tarsi decrease in talar declination angle.  Bipartite sesamoids noted. Assessment:   1. Sesamoiditis of right foot   2. Pes cavus    Plan:  Patient was evaluated and treated and all questions answered.  Right sesamoiditis -I explained the patient the etiology of sesamoiditis and various treatment options were discussed.  Given that patient has underlying pes cavus foot structure putting excessive pressure to the ball of the foot and the first MPJ complex I believe patient will benefit from steroid injection help decrease acute inflammatory component associated pain.  Patient agrees to the plan would like to  proceed with a steroid injection -A steroid injection was performed at right sesamoidal complex using 1% plain Lidocaine and 10 mg of Kenalog. This was well tolerated.  Pes cavus -I explained the patient the etiology of pes cavus foot structure and various treatment options were discussed.  Given the amount of pain that he is having I believe patient will benefit from custom-made orthotics to help support the high arch foot structure with offloading of the first metatarsal with dancers pad to take the stress off of the sesamoid.  Patient states understand would like to obtain orthotics -He will be scheduled to see EJ for orthotics with offloading of the first MPJ/sesamoid  No follow-ups on file.

## 2020-09-19 ENCOUNTER — Other Ambulatory Visit (HOSPITAL_COMMUNITY): Payer: Self-pay

## 2020-09-19 ENCOUNTER — Other Ambulatory Visit: Payer: Self-pay | Admitting: Family Medicine

## 2020-09-19 ENCOUNTER — Telehealth: Payer: Self-pay | Admitting: Cardiovascular Disease

## 2020-09-19 ENCOUNTER — Other Ambulatory Visit: Payer: Self-pay | Admitting: Gastroenterology

## 2020-09-19 MED ORDER — LEXAPRO 20 MG PO TABS
1.0000 | ORAL_TABLET | Freq: Every day | ORAL | 0 refills | Status: DC
  Filled 2020-09-19: qty 90, 90d supply, fill #0

## 2020-09-19 NOTE — Telephone Encounter (Signed)
Requested Prescriptions  Pending Prescriptions Disp Refills  . LEXAPRO 20 MG tablet 90 tablet 0    Sig: TAKE 1 TABLET BY MOUTH DAILY.     Psychiatry:  Antidepressants - SSRI Failed - 09/19/2020  4:30 PM      Failed - Valid encounter within last 6 months    Recent Outpatient Visits          6 months ago [place], PA-C   1 year ago Annual physical exam   [facility], PA-C   1 year ago Dysfunction of left eustachian tube   [facility], Vickki Muff, PA-C   2 years ago [place], PA-C   2 years ago Subacute maxillary sinusitis   [place], PA-C      Future Appointments            In 1 week Virgel Manifold, MD [facility] [place]   In 1 month [facility], Vickki Muff, PA-C [employer], [place]

## 2020-09-19 NOTE — Telephone Encounter (Signed)
Plavix is not on patient's current medication list, but it is not noted this medication was discontinued. Please advise if refill is appropriate and send to pharmacy on file.

## 2020-09-20 ENCOUNTER — Other Ambulatory Visit (HOSPITAL_COMMUNITY): Payer: Self-pay

## 2020-09-21 ENCOUNTER — Other Ambulatory Visit: Payer: Self-pay | Admitting: Gastroenterology

## 2020-09-21 ENCOUNTER — Other Ambulatory Visit (HOSPITAL_COMMUNITY): Payer: Self-pay

## 2020-09-22 ENCOUNTER — Telehealth: Payer: Self-pay | Admitting: Gastroenterology

## 2020-09-22 ENCOUNTER — Other Ambulatory Visit (HOSPITAL_COMMUNITY): Payer: Self-pay

## 2020-09-22 NOTE — Telephone Encounter (Signed)
pantoprazole (PROTONIX) 20 MG tablet  [place]  90 day

## 2020-09-22 NOTE — Telephone Encounter (Signed)
Message fed to Dr. Rockey Situ and his nurse Emeline General, RN to review and determine if clopidogrel needs to be continued.

## 2020-09-22 NOTE — Telephone Encounter (Signed)
Patient spouse calling  States pharmacy told them plavix was denied by our office and discontinued They were not aware and would like to know why Please call to discuss

## 2020-09-23 ENCOUNTER — Other Ambulatory Visit (HOSPITAL_COMMUNITY): Payer: Self-pay

## 2020-09-23 ENCOUNTER — Other Ambulatory Visit: Payer: Self-pay | Admitting: *Deleted

## 2020-09-23 ENCOUNTER — Other Ambulatory Visit: Payer: Self-pay

## 2020-09-23 DIAGNOSIS — K219 Gastro-esophageal reflux disease without esophagitis: Secondary | ICD-10-CM

## 2020-09-23 MED ORDER — PANTOPRAZOLE SODIUM 20 MG PO TBEC: DELAYED_RELEASE_TABLET | Freq: Every day | ORAL | 1 refills | Status: DC

## 2020-09-23 MED ORDER — CLOPIDOGREL BISULFATE 75 MG PO TABS
ORAL_TABLET | Freq: Every day | ORAL | 3 refills | Status: DC
Start: 2020-09-23 — End: 2021-09-16
  Filled 2020-09-23: qty 90, 90d supply, fill #0
  Filled 2020-11-10 – 2020-11-21 (×2): qty 90, 90d supply, fill #1
  Filled 2021-03-21: qty 90, 90d supply, fill #2
  Filled 2021-06-17: qty 90, 90d supply, fill #3

## 2020-09-23 NOTE — Progress Notes (Signed)
Medication expired 09/01/20 Yearly script renewed and sent in Plavix 75 mg QD

## 2020-09-27 ENCOUNTER — Ambulatory Visit: Payer: 59 | Admitting: Gastroenterology

## 2020-10-01 ENCOUNTER — Telehealth: Payer: 59 | Admitting: Nurse Practitioner

## 2020-10-01 DIAGNOSIS — U071 COVID-19: Secondary | ICD-10-CM | POA: Diagnosis not present

## 2020-10-01 MED ORDER — ALBUTEROL SULFATE HFA 108 (90 BASE) MCG/ACT IN AERS: 2.0000 | INHALATION_SPRAY | Freq: Four times a day (QID) | RESPIRATORY_TRACT | 0 refills | Status: DC | PRN

## 2020-10-01 MED ORDER — MOLNUPIRAVIR EUA 200MG CAPSULE: 4.0000 | ORAL_CAPSULE | Freq: Two times a day (BID) | ORAL | 0 refills | Status: AC

## 2020-10-01 NOTE — Patient Instructions (Signed)
You are being prescribed MOLNUPIRAVIR for COVID-19 infection.    Please pick up your prescription at: [facility] [place], [place]   Please call the pharmacy or go through the drive through vs going inside if you are picking up the mediation yourself to prevent further spread. If prescribed to a [facility] affiliated pharmacy, a pharmacist will bring the medication out to your car.   ADMINISTRATION INSTRUCTIONS: Take with or without food. Swallow the tablets whole. Don't chew, crush, or break the medications because it might not work as well  For each dose of the medication, you should be taking FOUR tablets at one time, TWICE a day   Finish your full five-day course of Molnupiravir even if you feel better before you're done. Stopping this medication too early can make it less effective to prevent severe illness related to [place].    Molnupiravir is prescribed for YOU ONLY. Don't share it with others, even if they have similar symptoms as you. This medication might not be right for everyone.   Make sure to take steps to protect yourself and others while you're taking this medication in order to get well soon and to prevent others from getting sick with COVID-19.   **If you are of childbearing potential (any gender) - it is advised to not get pregnant while taking this medication and recommended that condoms are used for male partners the next 3 months after taking the medication out of extreme caution    COMMON SIDE EFFECTS: Diarrhea Nausea  Dizziness    If your COVID-19 symptoms get worse, get medical help right away. Call 911 if you experience symptoms such as worsening cough, trouble breathing, chest pain that doesn't go away, confusion, a hard time staying awake, and pale or blue-colored skin. This medication won't prevent all COVID-19 cases from getting worse.      Person Under Monitoring Name: Kevin [name]  Location: [address] [place] [address]   Infection Prevention  Recommendations for Individuals Confirmed to have, or Being Evaluated for, 2019 Novel Coronavirus (COVID-19) Infection Who Receive Care at Home  Individuals who are confirmed to have, or are being evaluated for, COVID-19 should follow the prevention steps below until a healthcare provider or local or state health department says they can return to normal activities.  Stay home except to get medical care You should restrict activities outside your home, except for getting medical care. Do not go to work, school, or public areas, and do not use public transportation or taxis.  Call ahead before visiting your doctor Before your medical appointment, call the healthcare provider and tell them that you have, or are being evaluated for, COVID-19 infection. This will help the healthcare provider's office take steps to keep other people from getting infected. Ask your healthcare provider to call the local or state health department.  Monitor your symptoms Seek prompt medical attention if your illness is worsening (e.g., difficulty breathing). Before going to your medical appointment, call the healthcare provider and tell them that you have, or are being evaluated for, COVID-19 infection. Ask your healthcare provider to call the local or state health department.  Wear a facemask You should wear a facemask that covers your nose and mouth when you are in the same room with other people and when you visit a healthcare provider. People who live with or visit you should also wear a facemask while they are in the same room with you.  Separate yourself from other people in your  home As much as possible, you should stay in a different room from other people in your home. Also, you should use a separate bathroom, if available.  Avoid sharing household items You should not share dishes, drinking glasses, cups, eating utensils, towels, bedding, or other items with other people in your home. After using  these items, you should wash them thoroughly with soap and water.  Cover your coughs and sneezes Cover your mouth and nose with a tissue when you cough or sneeze, or you can cough or sneeze into your sleeve. Throw used tissues in a lined trash can, and immediately wash your hands with soap and water for at least 20 seconds or use an alcohol-based hand rub.  Wash your [facility] your hands often and thoroughly with soap and water for at least 20 seconds. You can use an alcohol-based hand sanitizer if soap and water are not available and if your hands are not visibly dirty. Avoid touching your eyes, nose, and mouth with unwashed hands.   Prevention Steps for Caregivers and Household Members of Individuals Confirmed to have, or Being Evaluated for, COVID-19 Infection Being Cared for in the Home  If you live with, or provide care at home for, a person confirmed to have, or being evaluated for, COVID-19 infection please follow these guidelines to prevent infection:  Follow healthcare provider's instructions Make sure that you understand and can help the patient follow any healthcare provider instructions for all care.  Provide for the patient's basic needs You should help the patient with basic needs in the home and provide support for getting groceries, prescriptions, and other personal needs.  Monitor the patient's symptoms If they are getting sicker, call his or her medical provider and tell them that the patient has, or is being evaluated for, COVID-19 infection. This will help the healthcare provider's office take steps to keep other people from getting infected. Ask the healthcare provider to call the local or state health department.  Limit the number of people who have contact with the patient If possible, have only one caregiver for the patient. Other household members should stay in another home or place of residence. If this is not possible, they should stay in another room,  or be separated from the patient as much as possible. Use a separate bathroom, if available. Restrict visitors who do not have an essential need to be in the home.  Keep older adults, very young children, and other sick people away from the patient Keep older adults, very young children, and those who have compromised immune systems or chronic health conditions away from the patient. This includes people with chronic heart, lung, or kidney conditions, diabetes, and cancer.  Ensure good ventilation Make sure that shared spaces in the home have good air flow, such as from an air conditioner or an opened window, weather permitting.  Wash your hands often Wash your hands often and thoroughly with soap and water for at least 20 seconds. You can use an alcohol based hand sanitizer if soap and water are not available and if your hands are not visibly dirty. Avoid touching your eyes, nose, and mouth with unwashed hands. Use disposable paper towels to dry your hands. If not available, use dedicated cloth towels and replace them when they become wet.  Wear a facemask and gloves Wear a disposable facemask at all times in the room and gloves when you touch or have contact with the patient's blood, body fluids, and/or secretions or excretions,  such as sweat, saliva, sputum, nasal mucus, vomit, urine, or feces.  Ensure the mask fits over your nose and mouth tightly, and do not touch it during use. Throw out disposable facemasks and gloves after using them. Do not reuse. Wash your hands immediately after removing your facemask and gloves. If your personal clothing becomes contaminated, carefully remove clothing and launder. Wash your hands after handling contaminated clothing. Place all used disposable facemasks, gloves, and other waste in a lined container before disposing them with other household waste. Remove gloves and wash your hands immediately after handling these items.  Do not share dishes,  glasses, or other household items with the patient Avoid sharing household items. You should not share dishes, drinking glasses, cups, eating utensils, towels, bedding, or other items with a patient who is confirmed to have, or being evaluated for, COVID-19 infection. After the person uses these items, you should wash them thoroughly with soap and water.  Wash laundry thoroughly Immediately remove and wash clothes or bedding that have blood, body fluids, and/or secretions or excretions, such as sweat, saliva, sputum, nasal mucus, vomit, urine, or feces, on them. Wear gloves when handling laundry from the patient. Read and follow directions on labels of laundry or clothing items and detergent. In general, wash and dry with the warmest temperatures recommended on the label.  Clean all areas the individual has used often Clean all touchable surfaces, such as counters, tabletops, doorknobs, bathroom fixtures, toilets, phones, keyboards, tablets, and bedside tables, every day. Also, clean any surfaces that may have blood, body fluids, and/or secretions or excretions on them. Wear gloves when cleaning surfaces the patient has come in contact with. Use a diluted bleach solution (e.g., dilute bleach with 1 part bleach and 10 parts water) or a household disinfectant with a label that says EPA-registered for coronaviruses. To make a bleach solution at home, add 1 tablespoon of bleach to 1 quart (4 cups) of water. For a larger supply, add  cup of bleach to 1 gallon (16 cups) of water. Read labels of cleaning products and follow recommendations provided on product labels. Labels contain instructions for safe and effective use of the cleaning product including precautions you should take when applying the product, such as wearing gloves or eye protection and making sure you have good ventilation during use of the product. Remove gloves and wash hands immediately after cleaning.  Monitor yourself for signs and  symptoms of illness Caregivers and household members are considered close contacts, should monitor their health, and will be asked to limit movement outside of the home to the extent possible. Follow the monitoring steps for close contacts listed on the symptom monitoring form.   ? If you have additional questions, contact your local health department or call the epidemiologist on call at [phone] (available 24/7). ? This guidance is subject to change. For the most up-to-date guidance from [facility], please refer to their website: [email or web address]
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
## 2020-10-05 ENCOUNTER — Other Ambulatory Visit (HOSPITAL_COMMUNITY): Payer: Self-pay

## 2020-10-05 MED ORDER — CARESTART COVID-19 HOME TEST VI KIT
PACK | 0 refills | Status: DC
  Filled 2020-10-05: qty 4, 4d supply, fill #0

## 2020-10-05 MED FILL — Ranolazine Tab ER 12HR 1000 MG: ORAL | 90 days supply | Qty: 180 | Fill #1 | Status: CN

## 2020-10-06 ENCOUNTER — Telehealth: Payer: Self-pay | Admitting: Cardiovascular Disease

## 2020-10-06 ENCOUNTER — Other Ambulatory Visit: Payer: Self-pay | Admitting: Nurse Practitioner

## 2020-10-06 ENCOUNTER — Other Ambulatory Visit (HOSPITAL_COMMUNITY): Payer: Self-pay

## 2020-10-06 MED FILL — Ranolazine Tab ER 12HR 1000 MG: ORAL | 30 days supply | Qty: 60 | Fill #1 | Status: CN

## 2020-10-06 NOTE — Telephone Encounter (Signed)
*  STAT* If patient is at the pharmacy, call can be transferred to refill team.   1. Which medications need to be refilled? (please list name of each medication and dose if known) Ranexa 1000 mg SR po BID  2. Which pharmacy/location (including street and city if local pharmacy) is medication to be sent to? [facility]   3. Do they need a 30 day or 90 day supply? [place]

## 2020-10-07 ENCOUNTER — Other Ambulatory Visit (HOSPITAL_COMMUNITY): Payer: Self-pay

## 2020-10-07 MED ORDER — RANEXA 1000 MG PO TB12
1000.0000 mg | ORAL_TABLET | Freq: Two times a day (BID) | ORAL | 0 refills | Status: DC
  Filled 2020-10-07: qty 180, 90d supply, fill #0

## 2020-10-07 NOTE — Telephone Encounter (Signed)
Rx sent to pharmacy as requested.

## 2020-10-07 NOTE — Telephone Encounter (Signed)
Pharmacy calling for update on refill request

## 2020-10-11 ENCOUNTER — Telehealth: Payer: Self-pay | Admitting: Gastroenterology

## 2020-10-11 DIAGNOSIS — K219 Gastro-esophageal reflux disease without esophagitis: Secondary | ICD-10-CM

## 2020-10-11 NOTE — Telephone Encounter (Signed)
Please send this RX to [facility]  pantoprazole (PROTONIX) 20 MG tablet Medication Date: 09/23/2020 Department: [facility] Ordering/Authorizing: Virgel Manifold, MD    Order Providers  Prescribing Provider Encounter Provider  Virgel Manifold, MD Alverda Skeans, RN   Outpatient Medication Detail   Disp Refills Start End   pantoprazole (PROTONIX) 20 MG tablet 90 tablet 1 09/23/2020 09/23/2021   Sig - Route: TAKE 1 TABLET BY MOUTH ONCE A DAY - Oral   Sent to pharmacy as: pantoprazole (PROTONIX) 20 MG tablet   E-Prescribing Status: Receipt confirmed by pharmacy (09/23/2020  9:44 AM EDT)    Associated Diagnoses  Gastroesophageal reflux disease, unspecified whether esophagitis present  - Primary      Pharmacy  [id number] - [place], [place] - [address]   Additional Information  Associated Reports  View Encounter  Priority and Order Details

## 2020-10-12 ENCOUNTER — Ambulatory Visit: Payer: 59

## 2020-10-12 ENCOUNTER — Other Ambulatory Visit (HOSPITAL_COMMUNITY): Payer: Self-pay

## 2020-10-12 ENCOUNTER — Other Ambulatory Visit: Payer: Self-pay

## 2020-10-12 DIAGNOSIS — M25871 Other specified joint disorders, right ankle and foot: Secondary | ICD-10-CM | POA: Diagnosis not present

## 2020-10-12 DIAGNOSIS — Q6672 Congenital pes cavus, left foot: Secondary | ICD-10-CM | POA: Diagnosis not present

## 2020-10-12 DIAGNOSIS — Q6671 Congenital pes cavus, right foot: Secondary | ICD-10-CM | POA: Diagnosis not present

## 2020-10-12 DIAGNOSIS — Q667 Congenital pes cavus, unspecified foot: Secondary | ICD-10-CM

## 2020-10-12 MED ORDER — PANTOPRAZOLE SODIUM 20 MG PO TBEC
DELAYED_RELEASE_TABLET | Freq: Every day | ORAL | 1 refills | Status: DC
  Filled 2020-10-12: qty 90, fill #0

## 2020-10-12 MED ORDER — PANTOPRAZOLE SODIUM 20 MG PO TBEC
DELAYED_RELEASE_TABLET | ORAL | 1 refills | Status: DC
  Filled 2020-10-12: qty 90, 90d supply, fill #0

## 2020-10-12 NOTE — Telephone Encounter (Signed)
Rx sent through e-scribe  

## 2020-10-24 ENCOUNTER — Other Ambulatory Visit (HOSPITAL_COMMUNITY): Payer: Self-pay

## 2020-10-24 ENCOUNTER — Ambulatory Visit (INDEPENDENT_AMBULATORY_CARE_PROVIDER_SITE_OTHER): Payer: 59 | Admitting: Family Medicine

## 2020-10-24 ENCOUNTER — Other Ambulatory Visit: Payer: Self-pay

## 2020-10-24 ENCOUNTER — Encounter: Payer: Self-pay | Admitting: Family Medicine

## 2020-10-24 VITALS — BP 106/62 | HR 73 | Temp 98.6°F | Wt 195.8 lb

## 2020-10-24 DIAGNOSIS — Z0000 Encounter for general adult medical examination without abnormal findings: Secondary | ICD-10-CM

## 2020-10-24 DIAGNOSIS — L409 Psoriasis, unspecified: Secondary | ICD-10-CM | POA: Diagnosis not present

## 2020-10-24 DIAGNOSIS — I25708 Atherosclerosis of coronary artery bypass graft(s), unspecified, with other forms of angina pectoris: Secondary | ICD-10-CM | POA: Diagnosis not present

## 2020-10-24 DIAGNOSIS — R39198 Other difficulties with micturition: Secondary | ICD-10-CM | POA: Diagnosis not present

## 2020-10-24 DIAGNOSIS — G4730 Sleep apnea, unspecified: Secondary | ICD-10-CM | POA: Diagnosis not present

## 2020-10-24 DIAGNOSIS — E782 Mixed hyperlipidemia: Secondary | ICD-10-CM

## 2020-10-24 MED ORDER — DESONIDE 0.05 % EX CREA
1.0000 "application " | TOPICAL_CREAM | Freq: Two times a day (BID) | CUTANEOUS | 2 refills | Status: AC | PRN
  Filled 2020-10-24: qty 30, 15d supply, fill #0

## 2020-10-24 NOTE — Progress Notes (Signed)
Complete physical exam   Patient: Kevin [name]   DOB: [birth year]   65 y.o. Male  MRN: [id number] Visit Date: 10/24/2020  Today's healthcare provider: Vernie Murders, PA-C   No chief complaint on file.  Subjective    Kevin [name] is a 65 y.o. male who presents today for a complete physical exam.  He reports consuming a general diet. The patient has a physically strenuous job, but has no regular exercise apart from work.  He generally feels well. He reports sleeping well. He does not have additional problems to discuss today.    Traveling to [place] for 3 weeks and would like a new refill for his desonide so he does not have to worry about running out.   Past Medical History:  Diagnosis Date   Brachial plexopathy    CKD (chronic kidney disease), stage II - III ([place])    Coronary artery disease    a. s/p 4 vessel CABG in 05/2008 w/ LIMA-LAD, SVG-Diag, sequential SVG-OM1/OM2; b. s/p PCI/DES x 2 to LAD in 10/2009; c. LHC 03/2013 stable disease and patent LAD stents; d. 10/2016 Cath: LM min irregs, LAD 20p, 67mISR, D2 80ost, LCX small, min irregs, OM1/2/3 min irregs, RCA/RPDA/RPAV min irregs, RPL1/2 nl RPL3 min irregs, VG->D2 nl, VG->OM1->OM2 100, EF 55-65%.   H/O maze procedure 05/17/2008   a. 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    Hypertension    Memory deficit after cerebral infarction    OSA (obstructive sleep apnea)    Overweight(278.02)    PAF (paroxysmal atrial fibrillation) ([facility]    a. s/p Maze 05/2008, previously on Eliquis->discontinued 05/2016; c. CHADS2VASc => 4 (HTN, stroke x 2, vascular disease)   Patent foramen ovale    a. 05/2008 s/p closure @ time of CABG.   Psoriatic arthritis ([facility]    Skin cancer    Sleep apnea    Stroke ([facility]    a. 07/2005 right brain; b. 05/2005 left brain; c. Resultant memory deficits.   Thrombocytopenia ([facility]    Past Surgical History:  Procedure Laterality Date    ACUTE PANCREATITIS  5/11   APPENDECTOMY  1984   CARDIAC CATHETERIZATION     CHOLECYSTECTOMY  08/19/2009   COLONOSCOPY WITH PROPOFOL N/A 06/07/2017   Procedure: COLONOSCOPY WITH PROPOFOL;  Surgeon: TVirgel Manifold MD;  Location: [facility] ENDOSCOPY;  Service: Endoscopy;  Laterality: N/A;   CORONARY ARTERY BYPASS GRAFT  05/12/2008   x4 by PeterVan Trigt,MD   ESOPHAGOGASTRODUODENOSCOPY N/A 03/03/2020   Procedure: ESOPHAGOGASTRODUODENOSCOPY (EGD);  Surgeon: TVirgel Manifold MD;  [facility];  Service: Endoscopy;  Laterality: N/A;   LEFT HEART CATH AND CORONARY ANGIOGRAPHY N/A 11/29/2016   Procedure: LEFT HEART CATH AND CORONARY ANGIOGRAPHY;  Surgeon: AWellington Hampshire MD;  [facility];  Service: Cardiovascular;  Laterality: N/A;   PATENT FORAMEN OVALE CLOSURE     TONSILLECTOMY     AS A CHILD   Social History   Socioeconomic History   Marital status: Married    Spouse name: SWynona Canes  Number of children: 3   Years of education: Not on file   Highest education level: Not on file  Occupational History   Occupation: Full time    Employer: [employer]  Tobacco Use   Smoking status: Never   Smokeless tobacco: Never  Vaping Use   Vaping  Use: Never used  Substance and Sexual Activity   Alcohol use: Yes    Comment: occassionally   Drug use: No   Sexual activity: Not on file  Other Topics Concern   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 Went to high school. He has worked at his present job for 22 years. He has been married for 34 years. They have 3 children. 2 of his daughters live in [place]. He drinks less than two cups of caffeine per day. He does not use  tobacco or recreational drugs. He has rare alcohol intake. Lives with wife and daughter      OSA diagnosed at [facility] , had his  sleep studies there  reviewed them  all in detail today he  has retrognathia, sinusitis,  rhinitis      His ESS remains very high  at 16 and FSS at 44 . His falls assessment tool score is 5.   nasonex, refitted mask, the recent  HST failed to document that  apnea is present at all. education about REM BD and EDS, narcolepsy , cataplexy.  45 minutes.    Social Determinants of Health   Financial Resource Strain: Not on file  Food Insecurity: Not on file  Transportation Needs: Not on file  Physical Activity: Not on file  Stress: Not on file  Social Connections: Not on file  Intimate Partner Violence: Not on file   Family Status  Relation Name Status   Sister  Deceased at age 3   Father  Deceased at age 49       [place]   Other AUNT Alive   Mother  Deceased at age 43       [place]  Deceased   Other  (Not Specified)   Annamarie Major  (Not Specified)   PGM  (Not Specified)   PGF  (Not Specified)   Family History  Problem Relation Age of Onset   Diabetes Sister    Ovarian cancer Sister    Psychiatric Illness Father    Heart attack Father    Diabetes Other    Hypertension Mother    Heart disease Mother    Lung cancer Paternal Aunt    Coronary artery disease Other    Diabetes Other    Hypertension Other    Hyperlipidemia Other    Cancer Paternal Uncle    Heart disease Paternal Grandmother    Heart disease Paternal Grandfather    Allergies  Allergen Reactions   Strawberry Extract Anaphylaxis        Dilaudid [Hydromorphone Hcl] Nausea And Vomiting    Patient Care Team: Letisia Schwalb, Vickki Muff, PA-C as PCP - General (Family Medicine) Rockey Situ, Kathlene November, MD as PCP - Cardiology (Cardiology) Minna Merritts, MD as Consulting Physician (Cardiology)   Medications: Outpatient Medications Prior to Visit  Medication Sig   albuterol (VENTOLIN HFA) 108 (90 Base) MCG/ACT inhaler Inhale 2 puffs into the lungs every 6 (six) hours as needed for wheezing or shortness of breath.   ALPRAZolam (XANAX)  0.5 MG tablet TAKE 1 TO 2 TABLETS BY MOUTH EVERY EVENING AT BEDTIME   amoxicillin (AMOXIL) 500 MG capsule    amoxicillin (AMOXIL) 500 MG capsule TAKE 4 CAPSULES BY MOUTH 1 HOUR PRIOR TO DENTAL APPT   ARTHRITIS PAIN RELIEF 650 MG CR tablet Take 650 mg by mouth every  8 (eight) hours as needed.   aspirin EC 81 MG tablet Take 81 mg by mouth daily.   cetirizine (ZYRTEC) 10 MG tablet Take 10 mg by mouth at bedtime.   clobetasol ointment (TEMOVATE) 3.82 % Apply 1 application topically 2 (two) times daily as needed (for psoriasis).   clopidogrel (PLAVIX) 75 MG tablet TAKE 1 TABLET BY MOUTH ONCE DAILY   COVID-19 At Home Antigen Test (CARESTART COVID-19 HOME TEST) KIT Use as directed within package instructions   desonide (DESOWEN) 0.05 % cream Apply 1 application topically 2 (two) times daily as needed (for psoriasis).   ezetimibe (ZETIA) 10 MG tablet TAKE 1 TABLET BY MOUTH ONCE A DAY   influenza vaccine adjuvanted (FLUAD) 0.5 ML injection USE AS DIRECTED   isosorbide mononitrate (IMDUR) 30 MG 24 hr tablet TAKE 1 TABLET BY MOUTH ONCE DAILY   LEXAPRO 20 MG tablet TAKE 1 TABLET BY MOUTH DAILY.   metoprolol tartrate (LOPRESSOR) 25 MG tablet TAKE 1 TABLET BY MOUTH 2 TIMES DAILY   nitroGLYCERIN (NITROSTAT) 0.4 MG SL tablet PLACE 1 TABLET (0.4 MG TOTAL) UNDER THE TONGUE EVERY 5 (FIVE) MINUTES AS NEEDED. MAY REPEAT FOR UP TO 3 DOSES. IF NO RELIEF WITH 1ST DOSE, CALL 911.   pantoprazole (PROTONIX) 20 MG tablet Take 1 tablet by mouth once a day   RANEXA 1000 MG SR tablet TAKE 1 TABLET BY MOUTH TWO TIMES DAILY   rosuvastatin (CRESTOR) 40 MG tablet TAKE 1 TABLET BY MOUTH ONCE A DAY   tacrolimus (PROTOPIC) 0.1 % ointment Apply twice daily to affected areas as needed   No facility-administered medications prior to visit.    Review of Systems  Gastrointestinal:  Positive for abdominal pain.  All other systems reviewed and are negative.    Objective    BP 106/62   Pulse 73   Temp 98.6 F (37 C) (Oral)    Wt 195 lb 12.8 oz (88.8 kg)   SpO2 98%   BMI 31.60 kg/m  BP Readings from Last 3 Encounters:  09/01/20 102/68  08/24/20 132/73  06/07/20 122/68   Wt Readings from Last 3 Encounters:  09/01/20 201 lb 3.2 oz (91.3 kg)  08/24/20 195 lb (88.5 kg)  06/07/20 204 lb (92.5 kg)    Physical Exam Constitutional:      Appearance: He is well-developed.  HENT:     Head: Normocephalic and atraumatic.     Right Ear: External ear normal.     Left Ear: External ear normal.     Nose: Nose normal.  Eyes:     General:        Right eye: No discharge.     Conjunctiva/sclera: Conjunctivae normal.     Pupils: Pupils are equal, round, and reactive to light.  Neck:     Thyroid: No thyromegaly.     Trachea: No tracheal deviation.  Cardiovascular:     Rate and Rhythm: Normal rate and regular rhythm.     Heart sounds: Normal heart sounds. No murmur heard. Pulmonary:     Effort: Pulmonary effort is normal. No respiratory distress.     Breath sounds: Normal breath sounds. No wheezing or rales.  Chest:     Chest wall: No tenderness.  Abdominal:     General: Bowel sounds are normal. There is no distension.     Palpations: Abdomen is soft. There is no mass.     Tenderness: There is no abdominal tenderness. There is no guarding or rebound.  Genitourinary:  Penis: Normal.      Testes: Normal.     Prostate: Normal.     Rectum: Normal. Guaiac result negative.  Musculoskeletal:        General: No tenderness. Normal range of motion.     Cervical back: Normal range of motion and neck supple.  Lymphadenopathy:     Cervical: No cervical adenopathy.  Skin:    General: Skin is warm and dry.     Findings: No erythema or rash.  Neurological:     Mental Status: He is alert and oriented to person, place, and time.     Cranial Nerves: No cranial nerve deficit.     Motor: No abnormal muscle tone.     Coordination: Coordination normal.     Deep Tendon Reflexes: Reflexes are normal and symmetric. Reflexes  normal.  Psychiatric:        Behavior: Behavior normal.        Thought Content: Thought content normal.        Judgment: Judgment normal.      Last depression screening scores PHQ 2/9 Scores 03/15/2020 09/14/2019 08/16/2017  PHQ - 2 Score 0 0 0  PHQ- 9 Score 0 0 -   Last fall risk screening Fall Risk  03/15/2020  Falls in the past year? 0  Number falls in past yr: 0  Injury with Fall? 0  Risk for fall due to : -  Follow up -   Last Audit-C alcohol use screening Alcohol Use Disorder Test (AUDIT) 03/15/2020  1. How often do you have a drink containing alcohol? 1  2. How many drinks containing alcohol do you have on a typical day when you are drinking? 0  3. How often do you have six or more drinks on one occasion? 0  AUDIT-C Score 1  Alcohol Brief Interventions/Follow-up AUDIT Score <7 follow-up not indicated   A score of 3 or more in women, and 4 or more in men indicates increased risk for alcohol abuse, EXCEPT if all of the points are from question 1   No results found for any visits on 10/24/20.  Assessment & Plan    Routine Health Maintenance and Physical Exam  Exercise Activities and Dietary recommendations  Goals   Continue to exercise regularly and low fat diet.     Immunization History  Administered Date(s) Administered   Influenza Split 01/13/2009   Influenza,inj,Quad PF,6+ Mos 03/30/2016, 12/31/2017   Influenza-Unspecified 01/24/2019, 02/03/2020   PFIZER(Purple Top)SARS-COV-2 Vaccination 02/15/2020   Pneumococcal Conjugate-13 09/14/2019   Tdap 08/27/2012    Health Maintenance  Topic Date Due   Zoster Vaccines- Shingrix (1 of 2) Never done   COVID-19 Vaccine (2 - Pfizer series) 03/07/2020   PNA vac Low Risk Adult (2 of 2 - PPSV23) 09/13/2020   INFLUENZA VACCINE  10/31/2020   TETANUS/TDAP  08/28/2022   COLONOSCOPY (Pts 45-25yr Insurance coverage will need to be confirmed)  06/08/2027   Hepatitis C Screening  Completed   HPV VACCINES  Aged Out     Discussed health benefits of physical activity, and encouraged him to engage in regular exercise appropriate for his age and condition.  1. Annual physical exam Good general health. Encouraged to check with pharmacy regarding Shingrix vaccination and get routine labs. - CBC with Differential/Platelet - Comprehensive metabolic panel - Lipid panel - TSH  2. Psoriasis Mild rash on both elbows and knees. Well controlled with prn use of Desowen cream. [place] - desonide (DESOWEN)  0.05 % cream; Apply 1 application topically 2 (two) times daily as needed (for psoriasis).  Dispense: 30 g; Refill: 2  3. Sleep apnea with use of continuous positive airway pressure (CPAP) Feels CPAP continues to help with energy level and sleep each night.  4. Atherosclerosis of coronary artery bypass graft of native heart with stable angina pectoris (HCC) No chest pains. Still taking Plavix 75 mg qd with ASA 81 mg qd. Recheck labs. - CBC with Differential/Platelet - Comprehensive metabolic panel - Lipid panel - TSH  5. Mixed hyperlipidemia Tolerating the Zetia 10 mg qd without side effects. Recheck labs to assess progress. - CBC with Differential/Platelet - Comprehensive metabolic panel - Lipid panel - TSH  6. Decreased urine stream Hesitant urine flow first thing in the mornings. No significant nocturia. No prostate enlargement or nodules on DRE. Recheck PSA. - Comprehensive metabolic panel - PSA   No follow-ups on file.     I, Jatziry Wechter, PA-C, have reviewed all documentation for this visit. The documentation on 10/24/20 for the exam, diagnosis, procedures, and orders are all accurate and complete.    Vernie Murders, PA-C  [employer] [phone] (phone) [phone] (fax)  [place]

## 2020-10-25 ENCOUNTER — Other Ambulatory Visit (HOSPITAL_COMMUNITY): Payer: Self-pay

## 2020-10-26 ENCOUNTER — Other Ambulatory Visit (HOSPITAL_COMMUNITY): Payer: Self-pay

## 2020-10-31 ENCOUNTER — Other Ambulatory Visit (HOSPITAL_COMMUNITY): Payer: Self-pay

## 2020-10-31 DIAGNOSIS — G4733 Obstructive sleep apnea (adult) (pediatric): Secondary | ICD-10-CM | POA: Diagnosis not present

## 2020-10-31 MED ORDER — CARESTART COVID-19 HOME TEST VI KIT
PACK | 0 refills | Status: DC
  Filled 2020-10-31: qty 4, 4d supply, fill #0

## 2020-10-31 MED FILL — Rosuvastatin Calcium Tab 40 MG: ORAL | 90 days supply | Qty: 90 | Fill #1 | Status: AC

## 2020-10-31 MED FILL — Ezetimibe Tab 10 MG: ORAL | 90 days supply | Qty: 90 | Fill #1 | Status: AC

## 2020-11-10 ENCOUNTER — Other Ambulatory Visit: Payer: Self-pay | Admitting: Family Medicine

## 2020-11-10 ENCOUNTER — Other Ambulatory Visit: Payer: Self-pay | Admitting: Cardiovascular Disease

## 2020-11-10 ENCOUNTER — Other Ambulatory Visit (HOSPITAL_COMMUNITY): Payer: Self-pay

## 2020-11-10 DIAGNOSIS — F419 Anxiety disorder, unspecified: Secondary | ICD-10-CM

## 2020-11-10 MED ORDER — LEXAPRO 20 MG PO TABS
20.0000 mg | ORAL_TABLET | Freq: Every day | ORAL | 0 refills | Status: DC
  Filled 2020-11-10 – 2020-11-21 (×2): qty 90, 90d supply, fill #0

## 2020-11-10 MED ORDER — RANEXA 1000 MG PO TB12
1000.0000 mg | ORAL_TABLET | Freq: Two times a day (BID) | ORAL | 0 refills | Status: DC
  Filled 2020-11-10 – 2020-11-21 (×2): qty 180, 90d supply, fill #0

## 2020-11-10 NOTE — Telephone Encounter (Signed)
Requested medication (s) are due for refill today: yes  Requested medication (s) are on the active medication list: yes  Last refill:  08/08/20-02/14/21 #60 1 refill  Future visit scheduled: no  Notes to clinic:  not delegated  per prtocol. [id number]     Requested Prescriptions  Pending Prescriptions Disp Refills   ALPRAZolam (XANAX) 0.5 MG tablet 60 tablet 1    Sig: TAKE 1 TO 2 TABLETS BY MOUTH EVERY EVENING AT BEDTIME     Not Delegated - Psychiatry:  Anxiolytics/Hypnotics Failed - 11/10/2020  3:01 PM      Failed - This refill cannot be delegated      Failed - Urine Drug Screen completed in last 360 days      Passed - Valid encounter within last 6 months    Recent Outpatient Visits           2 weeks ago Annual physical exam   [place], PA-C   8 months ago [place], Vickki Muff, PA-C   1 year ago Annual physical exam   [place], PA-C   1 year ago Dysfunction of left eustachian tube   [facility], Vickki Muff, PA-C   2 years ago [place], Vickki Muff, PA-C       Future Appointments             In 1 week Virgel Manifold, MD [facility] [place]            Signed Prescriptions Disp Refills   LEXAPRO 20 MG tablet 90 tablet 0    Sig: TAKE 1 TABLET BY MOUTH DAILY.     Psychiatry:  Antidepressants - SSRI Passed - 11/10/2020  3:01 PM      Passed - Valid encounter within last 6 months    Recent Outpatient Visits           2 weeks ago Annual physical exam   [place], PA-C   8 months ago [place], Vickki Muff, PA-C   1 year ago Annual physical exam   [place], PA-C   1 year ago Dysfunction of left eustachian tube   [place], Vickki Muff, PA-C   2 years ago [place], PA-C       Future Appointments             In 1 week Virgel Manifold, MD [place]

## 2020-11-10 NOTE — Telephone Encounter (Signed)
No future visit schedule

## 2020-11-11 NOTE — Telephone Encounter (Signed)
Please review. Thanks!  

## 2020-11-14 ENCOUNTER — Other Ambulatory Visit (HOSPITAL_COMMUNITY): Payer: Self-pay

## 2020-11-15 NOTE — Telephone Encounter (Signed)
Please review for Dennis 

## 2020-11-16 ENCOUNTER — Other Ambulatory Visit (HOSPITAL_COMMUNITY): Payer: Self-pay

## 2020-11-16 MED ORDER — ALPRAZOLAM 0.5 MG PO TABS
ORAL_TABLET | ORAL | 1 refills | Status: DC
Start: 2020-11-16 — End: 2021-03-21
  Filled 2020-11-16: qty 60, 30d supply, fill #0
  Filled 2021-02-10: qty 60, 30d supply, fill #1

## 2020-11-18 ENCOUNTER — Ambulatory Visit: Payer: 59 | Attending: Internal Medicine

## 2020-11-18 ENCOUNTER — Other Ambulatory Visit: Payer: Self-pay

## 2020-11-18 DIAGNOSIS — Z23 Encounter for immunization: Secondary | ICD-10-CM

## 2020-11-18 MED ORDER — PFIZER-BIONT COVID-19 VAC-TRIS 30 MCG/0.3ML IM SUSP
INTRAMUSCULAR | 0 refills | Status: DC
  Filled 2020-11-18: qty 0.3, 1d supply, fill #0

## 2020-11-18 NOTE — Progress Notes (Signed)
   Covid-19 Vaccination Clinic  Name:  Alwyn Wimp    MRN: [id number] DOB: January 26, 1956  11/18/2020  Mr. Noyce was observed post Covid-19 immunization for 15 minutes without incident. He was provided with Vaccine Information Sheet and instruction to access the V-Safe system.   Mr. Queiroz was instructed to call 911 with any severe reactions post vaccine: Difficulty breathing  Swelling of face and throat  A fast heartbeat  A bad rash all over body  Dizziness and weakness   Immunizations Administered     Name Date Dose VIS Date Route   PFIZER Comrnaty(Gray TOP) Covid-19 Vaccine 11/18/2020  2:48 PM 0.3 mL 03/10/2020 Intramuscular   Manufacturer: [place]   Lot: [id number]   NDC: [place], PharmD, MBA Clinical Acute Care Pharmacist

## 2020-11-21 ENCOUNTER — Other Ambulatory Visit (HOSPITAL_COMMUNITY): Payer: Self-pay

## 2020-11-21 MED FILL — Metoprolol Tartrate Tab 25 MG: ORAL | 90 days supply | Qty: 180 | Fill #1 | Status: AC

## 2020-11-22 ENCOUNTER — Other Ambulatory Visit: Payer: Self-pay

## 2020-11-22 ENCOUNTER — Encounter: Payer: Self-pay | Admitting: Gastroenterology

## 2020-11-22 ENCOUNTER — Ambulatory Visit (INDEPENDENT_AMBULATORY_CARE_PROVIDER_SITE_OTHER): Payer: 59 | Admitting: Gastroenterology

## 2020-11-22 ENCOUNTER — Other Ambulatory Visit (HOSPITAL_COMMUNITY): Payer: Self-pay

## 2020-11-22 VITALS — BP 116/77 | HR 59 | Temp 97.7°F | Ht 66.0 in | Wt 202.8 lb

## 2020-11-22 DIAGNOSIS — D649 Anemia, unspecified: Secondary | ICD-10-CM | POA: Diagnosis not present

## 2020-11-22 DIAGNOSIS — K219 Gastro-esophageal reflux disease without esophagitis: Secondary | ICD-10-CM | POA: Diagnosis not present

## 2020-11-22 MED ORDER — PANTOPRAZOLE SODIUM 20 MG PO TBEC
DELAYED_RELEASE_TABLET | ORAL | 3 refills | Status: DC
Start: 2020-11-22 — End: 2022-01-19
  Filled 2020-11-22: qty 90, fill #0
  Filled 2021-01-26: qty 90, 90d supply, fill #0
  Filled 2021-03-21 – 2021-04-23 (×2): qty 90, 90d supply, fill #1
  Filled 2021-07-17: qty 90, 90d supply, fill #2
  Filled 2021-10-22: qty 90, 90d supply, fill #3

## 2020-11-22 NOTE — Progress Notes (Signed)
Kevin Antigua, MD [address]  [place]  [place], [place] [address]  Main: [phone]  Fax: [phone]   Primary Care Physician: Margo Common, PA-C   Chief complaint: Heartburn  HPI: Kevin [name] is a 65 y.o. male here for follow-up of heartburn.  Patient presents with his wife who also helps provide history.  She is a Marine scientist.  They both state, as long as he is taking his Protonix once a day, he feels well.  However, even if he misses 1 dose, he starts to have heartburn and severe that day.  He also reports that he has been doing very well since Schatzki's ring dilation and has not had any further solid food or pill dysphagia.  No weight loss.  No nausea or vomiting.  No recurrent episodes of diverticulitis since May 2022.  He was referred to general surgery due to second episode of diverticulitis, but has not seen them yet   ROS: All ROS reviewed and negative except as per HPI   Past Medical History:  Diagnosis Date   Brachial plexopathy    CKD (chronic kidney disease), stage II - III ([place])    Coronary artery disease    a. s/p 4 vessel CABG in 05/2008 w/ LIMA-LAD, SVG-Diag, sequential SVG-OM1/OM2; b. s/p PCI/DES x 2 to LAD in 10/2009; c. LHC 03/2013 stable disease and patent LAD stents; d. 10/2016 Cath: LM min irregs, LAD 20p, 75mISR, D2 80ost, LCX small, min irregs, OM1/2/3 min irregs, RCA/RPDA/RPAV min irregs, RPL1/2 nl RPL3 min irregs, VG->D2 nl, VG->OM1->OM2 100, EF 55-65%.   H/O maze procedure 05/17/2008   a. 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    Hypertension    Memory deficit after cerebral infarction    OSA (obstructive sleep apnea)    Overweight(278.02)    [place]ox[place]mal [place]) [place]    a. s/p Maze 05/2008, previously on Eliquis->discontinued 05/2016; c. CHADS2VASc => 4 (HTN, stroke x 2, vascular disease)   Patent foramen ovale    a. 05/2008 s/p closure @ time  of CABG.   [place]    Skin cancer    Sleep apnea    Stroke ([facility]    a. 07/2005 right brain; b. 05/2005 left brain; c. Resultant memory deficits.   Thrombocytopenia ([facility]     Past Surgical History:  Procedure Laterality Date   ACUTE PANCREATITIS  5/11   APPENDECTOMY  1984   CARDIAC CATHETERIZATION     CHOLECYSTECTOMY  08/19/2009   COLONOSCOPY WITH PROPOFOL N/A 06/07/2017   Procedure: COLONOSCOPY WITH PROPOFOL;  Surgeon: TVirgel Manifold MD;  Location: [facility] ENDOSCOPY;  Service: Endoscopy;  Laterality: N/A;   CORONARY ARTERY BYPASS GRAFT  05/12/2008   x4 by PeterVan Trigt,MD   ESOPHAGOGASTRODUODENOSCOPY N/A 03/03/2020   Procedure: ESOPHAGOGASTRODUODENOSCOPY (EGD);  Surgeon: TVirgel Manifold MD;  [facility];  Service: Endoscopy;  Laterality: N/A;   LEFT HEART CATH AND CORONARY ANGIOGRAPHY N/A 11/29/2016   Procedure: LEFT HEART CATH AND CORONARY ANGIOGRAPHY;  Surgeon: AWellington Hampshire MD;  [facility];  Service: Cardiovascular;  Laterality: N/A;   PATENT FORAMEN OVALE CLOSURE     TONSILLECTOMY     AS A CHILD    Prior to Admission medications   Medication Sig Start Date End Date Taking? Authorizing Provider  albuterol (VENTOLIN HFA) 108 (90 Base) MCG/ACT inhaler  Inhale 2 puffs into the lungs every 6 (six) hours as needed for wheezing or shortness of breath. 10/01/20  Yes Apolonio Schneiders, FNP  ALPRAZolam Duanne Moron) 0.5 MG tablet TAKE 1 TO 2 TABLETS BY MOUTH EVERY EVENING AT BEDTIME 11/16/20 05/15/21 Yes Jerrol Banana., MD  amoxicillin (AMOXIL) 500 MG capsule TAKE 4 CAPSULES BY MOUTH 1 HOUR PRIOR TO DENTAL APPT 03/21/20 03/21/21 Yes   ARTHRITIS PAIN RELIEF 650 MG CR tablet Take 650 mg by mouth every 8 (eight) hours as needed. 08/05/19  Yes [provider]  aspirin EC 81 MG tablet Take 81 mg by mouth daily.   Yes [provider]  cetirizine (ZYRTEC) 10 MG tablet Take 10 mg by mouth at bedtime.   Yes [provider]  clopidogrel (PLAVIX) 75 MG tablet TAKE 1 TABLET BY MOUTH ONCE DAILY 09/23/20  Yes Gollan, Kathlene November, MD  COVID-19 At Home Antigen Test [facility] COVID-19 HOME TEST) KIT Use as directed within package instructions. 10/31/20  Yes Edmon Crape, RPH  COVID-19 mRNA Vac-TriS, Pfizer, (PFIZER-BIONT COVID-19 VAC-TRIS) SUSP injection Inject into the muscle. 11/18/20  Yes Carlyle Basques, MD  desonide (DESOWEN) 0.05 % cream Apply 1 application topically 2 (two) times daily as needed (for psoriasis). 10/24/20  Yes Chrismon, Vickki Muff, PA-C  ezetimibe (ZETIA) 10 MG tablet TAKE 1 TABLET BY MOUTH ONCE A DAY 03/30/20 03/30/21 Yes Gollan, Kathlene November, MD  influenza vaccine adjuvanted (FLUAD) 0.5 ML injection USE AS DIRECTED 02/03/20 02/02/21 Yes Carlyle Basques, MD  isosorbide mononitrate (IMDUR) 30 MG 24 hr tablet TAKE 1 TABLET BY MOUTH ONCE DAILY 08/08/20 08/08/21 Yes Gollan, Kathlene November, MD  LEXAPRO 20 MG tablet TAKE 1 TABLET BY MOUTH DAILY. 11/10/20 11/10/21 Yes Chrismon, Vickki Muff, PA-C  metoprolol tartrate (LOPRESSOR) 25 MG tablet TAKE 1 TABLET BY MOUTH 2 TIMES DAILY 03/30/20 03/30/21 Yes Gollan, Kathlene November, MD  nitroGLYCERIN (NITROSTAT) 0.4 MG SL tablet PLACE 1 TABLET (0.4 MG TOTAL) UNDER THE TONGUE EVERY 5 (FIVE) MINUTES AS NEEDED. MAY REPEAT FOR UP TO 3 DOSES. IF NO RELIEF WITH 1ST DOSE, CALL 911. 03/30/20 03/30/21 Yes Gollan, Kathlene November, MD  RANEXA 1000 MG SR tablet TAKE 1 TABLET BY MOUTH 2 TIMES DAILY 11/10/20 11/10/21 Yes Minna Merritts, MD  rosuvastatin (CRESTOR) 40 MG tablet TAKE 1 TABLET BY MOUTH ONCE A DAY 03/30/20 03/30/21 Yes Minna Merritts, MD  tacrolimus (PROTOPIC) 0.1 % ointment Apply twice daily to affected areas as needed 07/11/20  Yes   pantoprazole (PROTONIX) 20 MG tablet Take 1 tablet by mouth once a day 11/22/20   Virgel Manifold, MD    Family History  Problem Relation Age of Onset   Diabetes Sister    Ovarian cancer Sister    Psychiatric Illness Father    Heart attack Father    Diabetes  Other    Hypertension Mother    Heart disease Mother    Lung cancer Paternal Aunt    Coronary artery disease Other    Diabetes Other    Hypertension Other    Hyperlipidemia Other    Cancer Paternal Uncle    Heart disease Paternal Grandmother    Heart disease Paternal Grandfather      Social History   Tobacco Use   Smoking status: Never   Smokeless tobacco: Never  Vaping Use   Vaping Use: Never used  Substance Use Topics   Alcohol use: Yes    Comment: occassionally   Drug use: No    Allergies  as of 11/22/2020 - Review Complete 11/22/2020  Allergen Reaction Noted   Strawberry extract Anaphylaxis 12/04/2011   Dilaudid [hydromorphone hcl] Nausea And Vomiting 06/21/2010    Physical Examination:  Constitutional: General:   Alert,  Well-developed, well-nourished, pleasant and cooperative in NAD BP 116/77 (BP Location: Left Arm, Patient Position: Sitting, Cuff Size: Normal)   Pulse (!) 59   Temp 97.7 F (36.5 C) (Oral)   Ht '5\' 6"'  (1.676 m)   Wt 202 lb 12.8 oz (92 kg)   BMI 32.73 kg/m   Respiratory: Normal respiratory effort  Gastrointestinal:  Soft, non-tender and non-distended without masses, hepatosplenomegaly or hernias noted.  No guarding or rebound tenderness.     Cardiac: No clubbing or edema.  No cyanosis. Normal posterior tibial pedal pulses noted.  Psych:  Alert and cooperative. Normal mood and affect.  Musculoskeletal:  Normal gait. Head normocephalic, atraumatic. Symmetrical without gross deformities. 5/5 Lower extremity strength bilaterally.  Skin: Warm. Intact without significant lesions or rashes. No jaundice.  Neck: Supple, trachea midline  Lymph: No cervical lymphadenopathy  Psych:  Alert and oriented x3, Alert and cooperative. Normal mood and affect.  Labs: CMP     Component Value Date/Time   NA 137 08/24/2020 1218   NA 140 03/29/2020 1027   NA 141 03/18/2013 1739   K 4.1 08/24/2020 1218   K 4.0 03/18/2013 1739   CL 101 08/24/2020 1218    CL 107 03/18/2013 1739   CO2 26 08/24/2020 1218   CO2 30 03/18/2013 1739   GLUCOSE 106 (H) 08/24/2020 1218   GLUCOSE 79 03/18/2013 1739   BUN 16 08/24/2020 1218   BUN 19 03/29/2020 1027   BUN 23 (H) 03/18/2013 1739   CREATININE 1.23 08/24/2020 1218   CREATININE 1.50 (H) 12/18/2013 1008   CALCIUM 8.8 (L) 08/24/2020 1218   CALCIUM 9.2 03/18/2013 1739   PROT 7.4 08/24/2020 1218   PROT 6.7 03/29/2020 1027   ALBUMIN 4.2 08/24/2020 1218   ALBUMIN 4.6 03/29/2020 1027   AST 16 08/24/2020 1218   ALT 12 08/24/2020 1218   ALKPHOS 45 08/24/2020 1218   BILITOT 1.2 08/24/2020 1218   BILITOT 0.6 03/29/2020 1027   GFRNONAA >60 08/24/2020 1218   GFRNONAA 50 (L) 12/18/2013 1008   GFRAA 63 03/29/2020 1027   GFRAA 58 (L) 12/18/2013 1008   Lab Results  Component Value Date   WBC 7.3 09/01/2020   HGB 12.7 (L) 09/01/2020   HCT 36.8 (L) 09/01/2020   MCV 93.9 09/01/2020   PLT 203 09/01/2020   May 25 CBC showed normal hemoglobin at 13.05 March 2020 hemoglobin 12.8  Imaging Studies:   Assessment and Plan:   Kevin [name] is a 65 y.o. y/o male with history of reflux, diverticulitis, Schatzki's ring here for follow-up  Patient notices a great difference in symptoms when he takes his Protonix, as opposed to missing even 1 dose.  Given that the literature shows improved outcomes with patients with history of Schatzki's ring needing dilation, on ongoing PPI versus without, and that he has significant reflux symptoms without PPI, benefits of ongoing PPI use outweigh risks at this time.  He is on a low-dose already.  They requested refill.  This was sent to pharmacy.  (Risks of PPI use were discussed with patient including bone loss, C. Diff diarrhea, pneumonia, infections, CKD, electrolyte abnormalities.  Pt. Verbalizes understanding and chooses to continue the medication.)  Patient was previously referred to general surgery due to second episode of diverticulitis.  However, he has not made  this appointment and they would like to hold off on this visit, but if patient has another episode of reticulitis, they are willing to consider surgical referral again at that time.  As per new literature on uptodate "Although surgery was once advised for patients who have had two or more uncomplicated attacks of diverticulitis, there is increasing evidence that such arbitrary guidelines should be abandoned. The guidelines from the [place] of [employer] called for an individualized approach to recommending elective sigmoid colectomy after recovery from uncomplicated diverticulitis"  Patient's recent blood work also shows normocytic anemia, with no clear explanation or work-up for this.  Previous labs also show mildly low hemoglobin as detailed above.  CMP showed normal liver enzymes. will order iron panel.  We did discuss referral to hematology, after results are available, and they are agreeable with this as well   Dr Kevin [name]

## 2020-11-24 ENCOUNTER — Ambulatory Visit (INDEPENDENT_AMBULATORY_CARE_PROVIDER_SITE_OTHER): Payer: 59

## 2020-11-24 ENCOUNTER — Other Ambulatory Visit: Payer: Self-pay

## 2020-11-24 DIAGNOSIS — M25871 Other specified joint disorders, right ankle and foot: Secondary | ICD-10-CM

## 2020-11-24 DIAGNOSIS — Q667 Congenital pes cavus, unspecified foot: Secondary | ICD-10-CM

## 2020-11-24 NOTE — Patient Instructions (Signed)

## 2020-11-24 NOTE — Progress Notes (Signed)
Patient presents for orthotic pick up.  Verbal and written break in and wear instructions given.  Patient will follow up in 4 weeks with Dr if symptoms worsen or fail to improve. 

## 2020-12-06 ENCOUNTER — Other Ambulatory Visit (HOSPITAL_COMMUNITY): Payer: Self-pay

## 2020-12-08 ENCOUNTER — Telehealth: Payer: Self-pay | Admitting: Neurology

## 2020-12-08 DIAGNOSIS — Z0000 Encounter for general adult medical examination without abnormal findings: Secondary | ICD-10-CM | POA: Diagnosis not present

## 2020-12-08 DIAGNOSIS — I25708 Atherosclerosis of coronary artery bypass graft(s), unspecified, with other forms of angina pectoris: Secondary | ICD-10-CM | POA: Diagnosis not present

## 2020-12-08 DIAGNOSIS — D649 Anemia, unspecified: Secondary | ICD-10-CM | POA: Diagnosis not present

## 2020-12-08 DIAGNOSIS — R39198 Other difficulties with micturition: Secondary | ICD-10-CM | POA: Diagnosis not present

## 2020-12-08 DIAGNOSIS — E782 Mixed hyperlipidemia: Secondary | ICD-10-CM | POA: Diagnosis not present

## 2020-12-08 NOTE — Telephone Encounter (Signed)
Pt will be out of town to attend his scheduled appt and did not accept what was offered since it is at the end and beginning of the year. Pt's spouse would like to speak to the RN when available.

## 2020-12-08 NOTE — Telephone Encounter (Signed)
I called the pt's wife and advised we could see him on 01/30/2021 at 1045. Pt accepted appt.

## 2020-12-09 LAB — CBC WITH DIFFERENTIAL/PLATELET
Basophils Absolute: 0 10*3/uL (ref 0.0–0.2)
Basos: 1 %
EOS (ABSOLUTE): 0.4 10*3/uL (ref 0.0–0.4)
Eos: 7 %
Hematocrit: 36.6 % — ABNORMAL LOW (ref 37.5–51.0)
Hemoglobin: 12.6 g/dL — ABNORMAL LOW (ref 13.0–17.7)
Immature Grans (Abs): 0 10*3/uL (ref 0.0–0.1)
Immature Granulocytes: 1 %
Lymphocytes Absolute: 1.1 10*3/uL (ref 0.7–3.1)
Lymphs: 20 %
MCH: 32.4 pg (ref 26.6–33.0)
MCHC: 34.4 g/dL (ref 31.5–35.7)
MCV: 94 fL (ref 79–97)
Monocytes Absolute: 0.6 10*3/uL (ref 0.1–0.9)
Monocytes: 10 %
Neutrophils Absolute: 3.5 10*3/uL (ref 1.4–7.0)
Neutrophils: 61 %
Platelets: 182 10*3/uL (ref 150–450)
RBC: 3.89 x10E6/uL — ABNORMAL LOW (ref 4.14–5.80)
RDW: 12.4 % (ref 11.6–15.4)
WBC: 5.6 10*3/uL (ref 3.4–10.8)

## 2020-12-09 LAB — LIPID PANEL
Chol/HDL Ratio: 2 ratio (ref 0.0–5.0)
Cholesterol, Total: 108 mg/dL (ref 100–199)
HDL: 54 mg/dL (ref >39)
LDL Chol Calc (NIH): 41 mg/dL (ref 0–99)
Triglycerides: 54 mg/dL (ref 0–149)
VLDL Cholesterol Cal: 13 mg/dL (ref 5–40)

## 2020-12-09 LAB — COMPREHENSIVE METABOLIC PANEL
ALT: 14 IU/L (ref 0–44)
AST: 20 IU/L (ref 0–40)
Albumin/Globulin Ratio: 2.1 (ref 1.2–2.2)
Albumin: 4.5 g/dL (ref 3.8–4.8)
Alkaline Phosphatase: 48 IU/L (ref 44–121)
BUN/Creatinine Ratio: 17 (ref 10–24)
BUN: 20 mg/dL (ref 8–27)
Bilirubin Total: 0.5 mg/dL (ref 0.0–1.2)
CO2: 23 mmol/L (ref 20–29)
Calcium: 9.2 mg/dL (ref 8.6–10.2)
Chloride: 105 mmol/L (ref 96–106)
Creatinine, Ser: 1.15 mg/dL (ref 0.76–1.27)
Globulin, Total: 2.1 g/dL (ref 1.5–4.5)
Glucose: 108 mg/dL — ABNORMAL HIGH (ref 65–99)
Potassium: 4.6 mmol/L (ref 3.5–5.2)
Sodium: 142 mmol/L (ref 134–144)
Total Protein: 6.6 g/dL (ref 6.0–8.5)
eGFR: 70 mL/min/{1.73_m2} (ref >59)

## 2020-12-09 LAB — IRON,TIBC AND FERRITIN PANEL
Ferritin: 40 ng/mL (ref 30–400)
Iron Saturation: 28 % (ref 15–55)
Iron: 90 ug/dL (ref 38–169)
Total Iron Binding Capacity: 325 ug/dL (ref 250–450)
UIBC: 235 ug/dL (ref 111–343)

## 2020-12-09 LAB — PSA: Prostate Specific Ag, Serum: 0.3 ng/mL (ref 0.0–4.0)

## 2020-12-09 LAB — TSH: TSH: 2.21 u[IU]/mL (ref 0.450–4.500)

## 2020-12-12 ENCOUNTER — Ambulatory Visit: Payer: 59 | Admitting: Adult Health

## 2020-12-13 NOTE — Addendum Note (Signed)
Addended by: Lurlean Nanny on: 12/13/2020 09:25 AM   Modules accepted: Orders

## 2020-12-13 NOTE — Progress Notes (Signed)
Patient presents today to be casted for custom molded orthotics. Dr. Posey Pronto has been treating patient for Pes cavus, sesamoiditis.    Patient will be notified once orthotics arrive in office and reappoint for fitting at that time.

## 2021-01-17 ENCOUNTER — Inpatient Hospital Stay: Payer: 59

## 2021-01-17 ENCOUNTER — Inpatient Hospital Stay: Payer: 59 | Admitting: Oncology

## 2021-01-23 DIAGNOSIS — G4733 Obstructive sleep apnea (adult) (pediatric): Secondary | ICD-10-CM | POA: Diagnosis not present

## 2021-01-24 ENCOUNTER — Other Ambulatory Visit: Payer: Self-pay

## 2021-01-24 ENCOUNTER — Inpatient Hospital Stay: Payer: 59 | Attending: Oncology | Admitting: Oncology

## 2021-01-24 ENCOUNTER — Inpatient Hospital Stay: Payer: 59

## 2021-01-24 ENCOUNTER — Encounter: Payer: Self-pay | Admitting: Oncology

## 2021-01-24 VITALS — BP 123/62 | HR 73 | Temp 98.0°F | Resp 18 | Wt 196.8 lb

## 2021-01-24 DIAGNOSIS — I129 Hypertensive chronic kidney disease with stage 1 through stage 4 chronic kidney disease, or unspecified chronic kidney disease: Secondary | ICD-10-CM | POA: Diagnosis not present

## 2021-01-24 DIAGNOSIS — I480 Paroxysmal atrial fibrillation: Secondary | ICD-10-CM | POA: Insufficient documentation

## 2021-01-24 DIAGNOSIS — D649 Anemia, unspecified: Secondary | ICD-10-CM | POA: Insufficient documentation

## 2021-01-24 DIAGNOSIS — N183 Chronic kidney disease, stage 3 unspecified: Secondary | ICD-10-CM | POA: Insufficient documentation

## 2021-01-24 LAB — CBC WITH DIFFERENTIAL/PLATELET
Abs Immature Granulocytes: 0.03 10*3/uL (ref 0.00–0.07)
Basophils Absolute: 0.1 10*3/uL (ref 0.0–0.1)
Basophils Relative: 1 %
Eosinophils Absolute: 0.4 10*3/uL (ref 0.0–0.5)
Eosinophils Relative: 5 %
HCT: 38 % — ABNORMAL LOW (ref 39.0–52.0)
Hemoglobin: 12.9 g/dL — ABNORMAL LOW (ref 13.0–17.0)
Immature Granulocytes: 0 %
Lymphocytes Relative: 23 %
Lymphs Abs: 1.6 10*3/uL (ref 0.7–4.0)
MCH: 31.9 pg (ref 26.0–34.0)
MCHC: 33.9 g/dL (ref 30.0–36.0)
MCV: 93.8 fL (ref 80.0–100.0)
Monocytes Absolute: 0.6 10*3/uL (ref 0.1–1.0)
Monocytes Relative: 8 %
Neutro Abs: 4.4 10*3/uL (ref 1.7–7.7)
Neutrophils Relative %: 63 %
Platelets: 179 10*3/uL (ref 150–400)
RBC: 4.05 MIL/uL — ABNORMAL LOW (ref 4.22–5.81)
RDW: 12.6 % (ref 11.5–15.5)
WBC: 7 10*3/uL (ref 4.0–10.5)
nRBC: 0 % (ref 0.0–0.2)

## 2021-01-24 LAB — RETICULOCYTES
Immature Retic Fract: 8 % (ref 2.3–15.9)
RBC.: 4.04 MIL/uL — ABNORMAL LOW (ref 4.22–5.81)
Retic Count, Absolute: 71.9 10*3/uL (ref 19.0–186.0)
Retic Ct Pct: 1.8 % (ref 0.4–3.1)

## 2021-01-24 LAB — VITAMIN B12: Vitamin B-12: 218 pg/mL (ref 180–914)

## 2021-01-24 LAB — LACTATE DEHYDROGENASE: LDH: 123 U/L (ref 98–192)

## 2021-01-24 LAB — FOLATE: Folate: 13 ng/mL (ref >5.9)

## 2021-01-24 MED ORDER — FLUAD QUADRIVALENT 0.5 ML IM PRSY
PREFILLED_SYRINGE | INTRAMUSCULAR | 0 refills | Status: DC
  Filled 2021-01-24: qty 0.5, 1d supply, fill #0

## 2021-01-24 NOTE — Progress Notes (Signed)
Hematology/Oncology Consult note [facility] [phone] [phone]  Patient Care Team: Chrismon, Vickki Muff, PA-C (Inactive) as PCP - General (Family Medicine) Minna Merritts, MD as PCP - Cardiology (Cardiology) Minna Merritts, MD as Consulting Physician (Cardiology)   Name of the patient: Kevin [name]  [id number]  23-Apr-1955    Reason for referral-normocytic anemia   Referring physician-Dr. Bonna Gains  Date of visit: 01/24/21   History of presenting illness- Patient is a 65 year old male with a past medical history significant for hypertension hyperlipidemia obstructive sleep apnea psoriatic arthritis stage II CKD coronary artery disease among other medical problems.  He was seen by Dr. Bonna Gains recently for symptoms of heartburn.  He has been referred for anemia.  Most recent CBC showed white cell count of 5.6, H&H of 12.6/36.6 with an MCV of 94 and a platelet count of 184.  Ferritin levels normal at 40 and iron saturation of 28%.  Calcium normal Serum creatinine 1.1.  LFTs normal.  Patient reports feeling well overall.  Appetite and weight have remained normal.  Denies any unintentional weight loss.  Denies any blood loss in the stool or urine.  Last colonoscopy was in March 2019 which did not show any evidence of bleeding or malignancy.  Last upper endoscopy was in December 2021 which showed a mild Schatzki's ring at the GE junction.  Normal esophagus stomach and duodenal bulb.  ECOG PS- 1  Pain scale- 0   Review of systems- Review of Systems  Constitutional:  Negative for chills, fever, malaise/fatigue and weight loss.  HENT:  Negative for congestion, ear discharge and nosebleeds.   Eyes:  Negative for blurred vision.  Respiratory:  Negative for cough, hemoptysis, sputum production, shortness of breath and wheezing.   Cardiovascular:  Negative for chest pain, palpitations, orthopnea and claudication.  Gastrointestinal:  Negative  for abdominal pain, blood in stool, constipation, diarrhea, heartburn, melena, nausea and vomiting.  Genitourinary:  Negative for dysuria, flank pain, frequency, hematuria and urgency.  Musculoskeletal:  Negative for back pain, joint pain and myalgias.  Skin:  Negative for rash.  Neurological:  Negative for dizziness, tingling, focal weakness, seizures, weakness and headaches.  Endo/Heme/Allergies:  Does not bruise/bleed easily.  Psychiatric/Behavioral:  Negative for depression and suicidal ideas. The patient does not have insomnia.    Allergies  Allergen Reactions   Strawberry Extract Anaphylaxis        Dilaudid [Hydromorphone Hcl] Nausea And Vomiting    Patient Active Problem List   Diagnosis Date Noted   Mesenteric ischemia ([place]) 06/07/2020   Gastroesophageal reflux disease    Esophageal dysphagia    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Schatzki)    Sleep apnea with use of continuous positive airway pressure (CPAP) 11/25/2019   Lumbar spondylosis 08/20/2019   Long-term memory impairment 06/04/2018   Psoriasis 08/16/2017   Diverticulosis of large intestine without diverticulitis    MCI (mild cognitive impairment) 11/14/2016   Obstructive sleep apnea 09/22/2015   BP (high blood pressure) 09/22/2015   Allergic rhinitis 09/22/2015   Anxiety 09/22/2015   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[place]) 05/14/2014   CVA, old, 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   Hypersomnia, 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 s/p 4 vessel CABG in 05/2008 w/ LIMA-LAD, SVG-Diag, sequential SVG-OM1/OM2; b. s/p PCI/DES x 2 to LAD in 10/2009; c. LHC 03/2013 stable  disease and patent LAD stents; d. 10/2016 Cath: LM min irregs, LAD 20p, 71mISR, D2 80ost, LCX small, min irregs, OM1/2/3 min irregs, RCA/RPDA/RPAV min irregs, RPL1/2 nl RPL3 min irregs, VG->D2 nl, VG->OM1->OM2 100, EF 55-65%.   H/O maze procedure 05/17/2008   a. 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    Hypertension    Memory deficit after cerebral infarction    OSA (obstructive sleep apnea)    Overweight(278.02)    PAF (paroxysmal atrial fibrillation) ([place]    a. s/p Maze 05/2008, previously on Eliquis->discontinued 05/2016; c. CHADS2VASc => 4 (HTN, stroke x 2, vascular disease)   Patent foramen ovale    a. 05/2008 s/p closure @ time of CABG.   Psoriatic arthritis ([place]    Skin cancer    Sleep apnea    Stroke ([facility]    a. 07/2005 right brain; b. 05/2005 left brain; c. Resultant memory deficits.   Thrombocytopenia ([facility]      Past Surgical History:  Procedure Laterality Date   ACUTE PANCREATITIS  5/11   APPENDECTOMY  1984   CARDIAC CATHETERIZATION     CHOLECYSTECTOMY  08/19/2009   COLONOSCOPY WITH PROPOFOL N/A 06/07/2017   Procedure: COLONOSCOPY WITH PROPOFOL;  Surgeon: TVirgel Manifold MD;  Location: [facility] ENDOSCOPY;  Service: Endoscopy;  Laterality: N/A;   CORONARY ARTERY BYPASS GRAFT  05/12/2008   x4 by PeterVan Trigt,MD   ESOPHAGOGASTRODUODENOSCOPY N/A 03/03/2020   Procedure: ESOPHAGOGASTRODUODENOSCOPY (EGD);  Surgeon: TVirgel Manifold MD;  [facility];  Service: Endoscopy;  Laterality: N/A;   LEFT HEART CATH AND CORONARY ANGIOGRAPHY N/A 11/29/2016   Procedure: LEFT HEART CATH AND CORONARY ANGIOGRAPHY;  Surgeon: AWellington Hampshire MD;  [facility];  Service: Cardiovascular;  Laterality: N/A;   PATENT FORAMEN OVALE CLOSURE     TONSILLECTOMY     AS A CHILD    Social History   Socioeconomic History   Marital status: Married    Spouse name: SWynona Canes  Number of children: 3    Years of education: Not on file   Highest education level: Not on file  Occupational History   Occupation: Full time    Employer: [employer]  Tobacco Use   Smoking status: Never   Smokeless tobacco: Never  Vaping Use   Vaping Use: Never used  Substance and Sexual Activity   Alcohol use: Yes    Comment: occassionally   Drug use: No   Sexual activity: Not on file  Other Topics Concern   Not on file  Social History Narrative   Married (Health and safety inspector ,SEngineer, 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 He has worked at his present job for 22 years. He has been married for 34 years. They have 3 children. 2 [place] He drinks less than two cups of caffeine per day. He does not use  tobacco or recreational drugs. He has rare alcohol intake. Lives with wife and daughter      [facility], had his  sleep studies there  reviewed them all in detail today he  has retrognathia, sinusitis,  rhinitis      His ESS remains very high  at 16 and FSS at 52 . His falls assessment tool score is 5.   nasonex, refitted mask, the recent  HST failed to document that  apnea is present at all. education about REM BD and EDS, narcolepsy , cataplexy.  45 minutes.    Social Determinants of Health   Financial Resource Strain: Not on file  Food Insecurity: Not on file  Transportation Needs: Not on file  Physical Activity: Not on file  Stress: Not on file  Social Connections: Not on file  Intimate Partner Violence: Not on file     Family History  Problem Relation Age of Onset   Diabetes Sister    Ovarian cancer Sister    Psychiatric Illness Father    Heart attack Father    Diabetes Other    Hypertension Mother    Heart disease Mother    Lung cancer Paternal Aunt    Coronary artery disease Other    Diabetes Other    Hypertension Other    Hyperlipidemia Other    Cancer Paternal  Uncle    Heart disease Paternal Grandmother    Heart disease Paternal Grandfather      Current Outpatient Medications:    albuterol (VENTOLIN HFA) 108 (90 Base) MCG/ACT inhaler, Inhale 2 puffs into the lungs every 6 (six) hours as needed for wheezing or shortness of breath., Disp: 8 g, Rfl: 0   ALPRAZolam (XANAX) 0.5 MG tablet, TAKE 1 TO 2 TABLETS BY MOUTH EVERY EVENING AT BEDTIME, Disp: 60 tablet, Rfl: 1   amoxicillin (AMOXIL) 500 MG capsule, TAKE 4 CAPSULES BY MOUTH 1 HOUR PRIOR TO DENTAL APPT, Disp: 16 capsule, Rfl: 0   ARTHRITIS PAIN RELIEF 650 MG CR tablet, Take 650 mg by mouth every 8 (eight) hours as needed., Disp: , Rfl:    aspirin EC 81 MG tablet, Take 81 mg by mouth daily., Disp: , Rfl:    cetirizine (ZYRTEC) 10 MG tablet, Take 10 mg by mouth at bedtime., Disp: , Rfl:    clopidogrel (PLAVIX) 75 MG tablet, TAKE 1 TABLET BY MOUTH ONCE DAILY, Disp: 90 tablet, Rfl: 3   COVID-19 At Home Antigen Test (CARESTART COVID-19 HOME TEST) KIT, Use as directed within package instructions., Disp: 4 each, Rfl: 0   COVID-19 mRNA Vac-TriS, Pfizer, (PFIZER-BIONT COVID-19 VAC-TRIS) SUSP injection, Inject into the muscle., Disp: 0.3 mL, Rfl: 0   desonide (DESOWEN) 0.05 % cream, Apply 1 application topically 2 (two) times daily as needed (for psoriasis)., Disp: 30 g, Rfl: 2   ezetimibe (ZETIA) 10 MG tablet, TAKE 1 TABLET BY MOUTH ONCE A DAY, Disp: 90 tablet, Rfl: 3   influenza vaccine adjuvanted (FLUAD) 0.5 ML injection, USE AS DIRECTED, Disp: .5 mL, Rfl: 0   isosorbide mononitrate (IMDUR) 30 MG 24 hr tablet, TAKE 1 TABLET BY MOUTH ONCE DAILY, Disp: 90 tablet, Rfl: 1   LEXAPRO 20 MG tablet, TAKE 1 TABLET BY MOUTH DAILY., Disp: 90 tablet, Rfl: 0   metoprolol tartrate (LOPRESSOR) 25 MG tablet, TAKE 1 TABLET BY MOUTH 2 TIMES DAILY, Disp: 180 tablet, Rfl: 3   nitroGLYCERIN (NITROSTAT) 0.4 MG SL tablet, PLACE 1 TABLET (0.4 MG TOTAL) UNDER THE TONGUE EVERY 5 (FIVE) MINUTES AS NEEDED. MAY REPEAT FOR UP TO 3 DOSES.  IF NO RELIEF WITH 1ST DOSE, CALL 911., Disp: 25 tablet, Rfl: 1   pantoprazole (PROTONIX) 20 MG tablet, Take 1 tablet by mouth once a  day, Disp: 90 tablet, Rfl: 3   RANEXA 1000 MG SR tablet, TAKE 1 TABLET BY MOUTH 2 TIMES DAILY, Disp: 180 tablet, Rfl: 0   rosuvastatin (CRESTOR) 40 MG tablet, TAKE 1 TABLET BY MOUTH ONCE A DAY, Disp: 90 tablet, Rfl: 3   tacrolimus (PROTOPIC) 0.1 % ointment, Apply twice daily to affected areas as needed, Disp: 30 g, Rfl: 2   Physical exam:  Vitals:   01/24/21 1517  BP: 123/62  Pulse: 73  Resp: 18  Temp: 98 F (36.7 C)  SpO2: 96%  Weight: 196 lb 12.8 oz (89.3 kg)   Physical Exam Cardiovascular:     Rate and Rhythm: Normal rate and regular rhythm.     Heart sounds: Normal heart sounds.  Pulmonary:     Effort: Pulmonary effort is normal.     Breath sounds: Normal breath sounds.  Abdominal:     General: Bowel sounds are normal.     Palpations: Abdomen is soft.     Comments: No palpable hepatosplenomegaly  Lymphadenopathy:     Comments: No palpable cervical, supraclavicular, axillary or inguinal adenopathy    Skin:    General: Skin is warm and dry.  Neurological:     Mental Status: He is alert and oriented to person, place, and time.       CMP Latest Ref Rng & Units 12/08/2020  Glucose 65 - 99 mg/dL 108(H)  BUN 8 - 27 mg/dL 20  Creatinine 0.76 - 1.27 mg/dL 1.15  Sodium 134 - 144 mmol/L 142  Potassium 3.5 - 5.2 mmol/L 4.6  Chloride 96 - 106 mmol/L 105  CO2 20 - 29 mmol/L 23  Calcium 8.6 - 10.2 mg/dL 9.2  Total Protein 6.0 - 8.5 g/dL 6.6  Total Bilirubin 0.0 - 1.2 mg/dL 0.5  Alkaline Phos 44 - 121 IU/L 48  AST 0 - 40 IU/L 20  ALT 0 - 44 IU/L 14   CBC Latest Ref Rng & Units 12/08/2020  WBC 3.4 - 10.8 x10E3/uL 5.6  Hemoglobin 13.0 - 17.7 g/dL 12.6(L)  Hematocrit 37.5 - 51.0 % 36.6(L)  Platelets 150 - 450 x10E3/uL 182   Assessment and plan- Patient is a 65 y.o. male referred for normocytic anemia  Patient's baseline hemoglobin is around  13.5.  He was noted to have a hemoglobin of 12.8 in June 2021 and his hemoglobin is remained around that range over the last 1 year.  He has stage II CKD with his recent creatinine was normal at 1.1.Iron studies show a ferritin level of greater than 30 at 40.  Iron saturation normal at 28%.  No convincing evidence of iron deficiency at this time.  Anemia is relatively mild.  I will check a CBC with differential, TSH, B12 folate reticulocyte count haptoglobin today. reversible etiology of his anemia I am inclined to monitor his blood work conservatively   Thank you for this kind referral and the opportunity to participate in the care of this patient   Visit Diagnosis 1. Normocytic anemia     Dr. Randa Evens, MD, MPH [facility] [phone] 01/24/2021 5:06 PM

## 2021-01-25 ENCOUNTER — Ambulatory Visit (INDEPENDENT_AMBULATORY_CARE_PROVIDER_SITE_OTHER): Payer: 59 | Admitting: Gastroenterology

## 2021-01-25 ENCOUNTER — Encounter: Payer: Self-pay | Admitting: Gastroenterology

## 2021-01-25 VITALS — BP 112/67 | HR 72 | Temp 98.4°F | Ht 66.0 in | Wt 197.2 lb

## 2021-01-25 DIAGNOSIS — K5732 Diverticulitis of large intestine without perforation or abscess without bleeding: Secondary | ICD-10-CM

## 2021-01-25 LAB — HAPTOGLOBIN: Haptoglobin: 122 mg/dL (ref 32–363)

## 2021-01-25 NOTE — Progress Notes (Signed)
[name], MD [address]  [place] [address]  Main: [phone]  Fax: [phone]   Primary Care Physician: Tania Ade (Inactive)   Chief Complaint  Patient presents with   Diverticulitis    HPI: Kevin [name] is a 65 y.o. male with previous episodes of diverticulitis here for follow-up.  Patient recently was on vacation in [place] and reports having an episode of diverticulitis there.  Had left lower quadrant abdominal pain then went to a physician there had reported to them that he has had diverticulitis before.  Based on his symptoms, he was given a course of antibiotics.  With the first course, symptoms did not completely resolve and he was given a second course after about 4 days symptoms completely resolved.  He is not having any symptoms at this time.  He is reporting constipation.  States has a bowel movement every day but describes "aggressively straining" sometimes with it.  No blood in stool.  No nausea or vomiting.  No weight loss.  Was previously referred to surgery for episodes of recurrent diverticulitis but chose not to make an appointment last time.     ROS: All ROS reviewed and negative except as per HPI   Past Medical History:  Diagnosis Date   Brachial plexopathy    CKD (chronic kidney disease), stage II - III (HCC)    Coronary artery disease    a. s/p 4 vessel CABG in 05/2008 w/ LIMA-LAD, SVG-Diag, sequential SVG-OM1/OM2; b. s/p PCI/DES x 2 to LAD in 10/2009; c. LHC 03/2013 stable disease and patent LAD stents; d. 10/2016 Cath: LM min irregs, LAD 20p, 66mISR, D2 80ost, LCX small, min irregs, OM1/2/3 min irregs, RCA/RPDA/RPAV min irregs, RPL1/2 nl RPL3 min irregs, VG->D2 nl, VG->OM1->OM2 100, EF 55-65%.   H/O maze procedure 05/17/2008   a. 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    Hypertension    Memory deficit after cerebral infarction     OSA (obstructive sleep apnea)    Overweight(278.02)    PAF (paroxysmal atrial fibrillation) ([place]    a. s/p Maze 05/2008, previously on Eliquis->discontinued 05/2016; c. CHADS2VASc => 4 (HTN, stroke x 2, vascular disease)   Patent foramen ovale    a. 05/2008 s/p closure @ time of CABG.   Psoriatic arthritis ([place]    Skin cancer    Sleep apnea    Stroke ([facility]    a. 07/2005 right brain; b. 05/2005 left brain; c. Resultant memory deficits.   Thrombocytopenia ([facility]     Past Surgical History:  Procedure Laterality Date   ACUTE PANCREATITIS  5/11   APPENDECTOMY  1984   CARDIAC CATHETERIZATION     CHOLECYSTECTOMY  08/19/2009   COLONOSCOPY WITH PROPOFOL N/A 06/07/2017   Procedure: COLONOSCOPY WITH PROPOFOL;  Surgeon: TVirgel Manifold MD;  Location: [facility] ENDOSCOPY;  Service: Endoscopy;  Laterality: N/A;   CORONARY ARTERY BYPASS GRAFT  05/12/2008   x4 by PeterVan Trigt,MD   ESOPHAGOGASTRODUODENOSCOPY N/A 03/03/2020   Procedure: ESOPHAGOGASTRODUODENOSCOPY (EGD);  Surgeon: TVirgel Manifold MD;  [facility];  Service: Endoscopy;  Laterality: N/A;   LEFT HEART CATH AND CORONARY ANGIOGRAPHY N/A 11/29/2016   Procedure: LEFT HEART CATH AND CORONARY ANGIOGRAPHY;  Surgeon: AWellington Hampshire MD;  [facility];  Service: Cardiovascular;  Laterality: N/A;   PATENT FORAMEN OVALE CLOSURE  TONSILLECTOMY     AS A CHILD    Prior to Admission medications   Medication Sig Start Date End Date Taking? Authorizing Provider  albuterol (VENTOLIN HFA) 108 (90 Base) MCG/ACT inhaler Inhale 2 puffs into the lungs every 6 (six) hours as needed for wheezing or shortness of breath. 10/01/20  Yes Apolonio Schneiders, FNP  ALPRAZolam Duanne Moron) 0.5 MG tablet TAKE 1 TO 2 TABLETS BY MOUTH EVERY EVENING AT BEDTIME 11/16/20 05/15/21 Yes Jerrol Banana., MD  amoxicillin (AMOXIL) 500 MG capsule TAKE 4 CAPSULES BY MOUTH 1 HOUR PRIOR TO DENTAL APPT 03/21/20 03/21/21 Yes   ARTHRITIS PAIN RELIEF  650 MG CR tablet Take 650 mg by mouth every 8 (eight) hours as needed. 08/05/19  Yes [provider]  aspirin EC 81 MG tablet Take 81 mg by mouth daily.   Yes [provider]  cetirizine (ZYRTEC) 10 MG tablet Take 10 mg by mouth at bedtime.   Yes [provider]  clopidogrel (PLAVIX) 75 MG tablet TAKE 1 TABLET BY MOUTH ONCE DAILY 09/23/20  Yes Gollan, Kathlene November, MD  desonide (DESOWEN) 0.05 % cream Apply 1 application topically 2 (two) times daily as needed (for psoriasis). 10/24/20  Yes Chrismon, Vickki Muff, PA-C  ezetimibe (ZETIA) 10 MG tablet TAKE 1 TABLET BY MOUTH ONCE A DAY 03/30/20 03/30/21 Yes Gollan, Kathlene November, MD  isosorbide mononitrate (IMDUR) 30 MG 24 hr tablet TAKE 1 TABLET BY MOUTH ONCE DAILY 08/08/20 08/08/21 Yes Gollan, Kathlene November, MD  LEXAPRO 20 MG tablet TAKE 1 TABLET BY MOUTH DAILY. 11/10/20 11/10/21 Yes Chrismon, Vickki Muff, PA-C  metoprolol tartrate (LOPRESSOR) 25 MG tablet TAKE 1 TABLET BY MOUTH 2 TIMES DAILY 03/30/20 03/30/21 Yes Gollan, Kathlene November, MD  nitroGLYCERIN (NITROSTAT) 0.4 MG SL tablet PLACE 1 TABLET (0.4 MG TOTAL) UNDER THE TONGUE EVERY 5 (FIVE) MINUTES AS NEEDED. MAY REPEAT FOR UP TO 3 DOSES. IF NO RELIEF WITH 1ST DOSE, CALL 911. 03/30/20 03/30/21 Yes Gollan, Kathlene November, MD  pantoprazole (PROTONIX) 20 MG tablet Take 1 tablet by mouth once a day 11/22/20  Yes Mickie Badders, Margretta Sidle B, MD  RANEXA 1000 MG SR tablet TAKE 1 TABLET BY MOUTH 2 TIMES DAILY 11/10/20 11/10/21 Yes Gollan, Kathlene November, MD  rosuvastatin (CRESTOR) 40 MG tablet TAKE 1 TABLET BY MOUTH ONCE A DAY 03/30/20 03/30/21 Yes Minna Merritts, MD  tacrolimus (PROTOPIC) 0.1 % ointment Apply twice daily to affected areas as needed 07/11/20  Yes   COVID-19 At Home Antigen Test (CARESTART COVID-19 HOME TEST) KIT Use as directed within package instructions. Patient not taking: No sig reported 10/31/20   Edmon Crape, RPH  COVID-19 mRNA Vac-TriS, Pfizer, (PFIZER-BIONT COVID-19 VAC-TRIS) SUSP injection Inject into  the muscle. Patient not taking: No sig reported 11/18/20   Carlyle Basques, MD  influenza vaccine adjuvanted (FLUAD QUADRIVALENT) 0.5 ML injection Inject into the muscle. Patient not taking: Reported on 01/25/2021 01/24/21   Carlyle Basques, MD  influenza vaccine adjuvanted (FLUAD) 0.5 ML injection USE AS DIRECTED Patient not taking: Reported on 01/25/2021 02/03/20 02/02/21  Carlyle Basques, MD    Family History  Problem Relation Age of Onset   Diabetes Sister    Ovarian cancer Sister    Psychiatric Illness Father    Heart attack Father    Diabetes Other    Hypertension Mother    Heart disease Mother    Lung cancer Paternal Aunt    Coronary artery disease Other    Diabetes Other    Hypertension Other  Hyperlipidemia Other    Cancer Paternal Uncle    Heart disease Paternal Grandmother    Heart disease Paternal Grandfather      Social History   Tobacco Use   Smoking status: Never   Smokeless tobacco: Never  Vaping Use   Vaping Use: Never used  Substance Use Topics   Alcohol use: Yes    Comment: occassionally   Drug use: No    Allergies as of 01/25/2021 - Review Complete 01/25/2021  Allergen Reaction Noted   Strawberry extract Anaphylaxis 12/04/2011   Dilaudid [hydromorphone hcl] Nausea And Vomiting 06/21/2010    Physical Examination:  Constitutional: General:   Alert,  Well-developed, well-nourished, pleasant and cooperative in NAD BP 112/67 (BP Location: Right Arm, Patient Position: Sitting, Cuff Size: Large)   Pulse 72   Temp 98.4 F (36.9 C) (Oral)   Ht '5\' 6"'  (1.676 m)   Wt 197 lb 3.2 oz (89.4 kg)   BMI 31.83 kg/m   Respiratory: Normal respiratory effort  Gastrointestinal:  Soft, non-tender and non-distended without masses, hepatosplenomegaly or hernias noted.  No guarding or rebound tenderness.     Cardiac: No clubbing or edema.  No cyanosis. Normal posterior tibial pedal pulses noted.  Psych:  Alert and cooperative. Normal mood and  affect.  Musculoskeletal:  Normal gait. Head normocephalic, atraumatic. Symmetrical without gross deformities. 5/5 Lower extremity strength bilaterally.  Skin: Warm. Intact without significant lesions or rashes. No jaundice.  Neck: Supple, trachea midline  Lymph: No cervical lymphadenopathy  Psych:  Alert and oriented x3, Alert and cooperative. Normal mood and affect.  Labs: CMP     Component Value Date/Time   NA 142 12/08/2020 0948   NA 141 03/18/2013 1739   K 4.6 12/08/2020 0948   K 4.0 03/18/2013 1739   CL 105 12/08/2020 0948   CL 107 03/18/2013 1739   CO2 23 12/08/2020 0948   CO2 30 03/18/2013 1739   GLUCOSE 108 (H) 12/08/2020 0948   GLUCOSE 106 (H) 08/24/2020 1218   GLUCOSE 79 03/18/2013 1739   BUN 20 12/08/2020 0948   BUN 23 (H) 03/18/2013 1739   CREATININE 1.15 12/08/2020 0948   CREATININE 1.50 (H) 12/18/2013 1008   CALCIUM 9.2 12/08/2020 0948   CALCIUM 9.2 03/18/2013 1739   PROT 6.6 12/08/2020 0948   ALBUMIN 4.5 12/08/2020 0948   AST 20 12/08/2020 0948   ALT 14 12/08/2020 0948   ALKPHOS 48 12/08/2020 0948   BILITOT 0.5 12/08/2020 0948   GFRNONAA >60 08/24/2020 1218   GFRNONAA 50 (L) 12/18/2013 1008   GFRAA 63 03/29/2020 1027   GFRAA 58 (L) 12/18/2013 1008   Lab Results  Component Value Date   WBC 7.0 01/24/2021   HGB 12.9 (L) 01/24/2021   HCT 38.0 (L) 01/24/2021   MCV 93.8 01/24/2021   PLT 179 01/24/2021    Imaging Studies:   Assessment and Plan:   Kevin [name] is a 65 y.o. y/o male here for follow-up of diverticulitis  Clinical history consistent with likely diverticulitis that occurred while he was in [place].  Symptoms have completely resolved at this time.  No indication for repeat imaging at this time or repeat course of antibiotics  However, I have encouraged surgery follow-up at this time.  Will refer to surgery again given that he never made an appointment last time and patient is agreeable with this plan.  If symptoms recur patient  advised to call us  In the meantime I have advised him to start taking either  fiber supplement, or MiraLAX or Metamucil every day with goal of 1-2 soft bowel movements daily given that he is straining with his bowel movements.  High-fiber diet encouraged as well.    Dr [name]

## 2021-01-26 ENCOUNTER — Ambulatory Visit (INDEPENDENT_AMBULATORY_CARE_PROVIDER_SITE_OTHER): Payer: 59 | Admitting: Podiatry

## 2021-01-26 ENCOUNTER — Other Ambulatory Visit (HOSPITAL_COMMUNITY): Payer: Self-pay

## 2021-01-26 ENCOUNTER — Other Ambulatory Visit: Payer: Self-pay

## 2021-01-26 DIAGNOSIS — M25871 Other specified joint disorders, right ankle and foot: Secondary | ICD-10-CM

## 2021-01-26 DIAGNOSIS — Q667 Congenital pes cavus, unspecified foot: Secondary | ICD-10-CM

## 2021-01-27 ENCOUNTER — Ambulatory Visit: Payer: Self-pay | Admitting: *Deleted

## 2021-01-27 NOTE — Telephone Encounter (Signed)
Reason for Disposition  Blood in semen  Answer Assessment - Initial Assessment Questions 1. SYMPTOM: "What's the main symptom you're concerned about?" (e.g., discharge from penis, rash, pain, itching, swelling)     rusty tone- blood clots in the sperm 2. LOCATION: "Where is the blood located?"     Only in sperm 3. ONSET: "When did discoloration in sperm  start?"     1 week 4. PAIN: "Is there any pain?" If Yes, ask: "How bad is it?"  (Scale 1-10; or mild, moderate, severe)     Lower abdominal discomfort-midline 5. URINE: "Any difficulty passing urine?" If Yes, ask: "When was the last time?"     Slow flow in am 6. CAUSE: "What do you think is causing the symptoms?"     unsure 7. OTHER SYMPTOMS: "Do you have any other symptoms?" (e.g., fever, abdominal pain, blood in urine)     Abdominal discomfort  Protocols used: Penis and Scrotum Symptoms-A-AH

## 2021-01-27 NOTE — Telephone Encounter (Signed)
Patient is calling to request appointment- he has rust color semen with small blood clots. Patient states he noticed this 7-9 days ago after returning from trip out of the country. Patient has slight lower abdominal discomfort- but no other symptoms. Call to office- appointment scheduled.

## 2021-01-30 ENCOUNTER — Ambulatory Visit (INDEPENDENT_AMBULATORY_CARE_PROVIDER_SITE_OTHER): Payer: 59 | Admitting: Adult Health

## 2021-01-30 ENCOUNTER — Encounter: Payer: Self-pay | Admitting: Adult Health

## 2021-01-30 VITALS — BP 114/66 | HR 51 | Ht 66.0 in | Wt 196.0 lb

## 2021-01-30 DIAGNOSIS — G4733 Obstructive sleep apnea (adult) (pediatric): Secondary | ICD-10-CM | POA: Diagnosis not present

## 2021-01-30 DIAGNOSIS — Z9989 Dependence on other enabling machines and devices: Secondary | ICD-10-CM | POA: Diagnosis not present

## 2021-01-30 NOTE — Progress Notes (Signed)
PATIENT: Kevin [name] DOB: [birth year]  REASON FOR VISIT: follow up HISTORY FROM: patient   HISTORY OF PRESENT ILLNESS: Today 01/30/21 Kevin [name] is a 65 year old male with a history of obstructive sleep apnea on CPAP.  His download indicates that he uses his machine 29 out of 30 days for compliance of 96.7%.  On average he uses his machine 9 hours and 34 minutes most nights.  His residual AHI is 3.3 on 4 to 20 cm of water.  He reports that the CPAP works well for him.  He recently traveled to [place].  He returns today for an evaluation.   REVIEW OF SYSTEMS: Out of a complete 14 system review of symptoms, the patient complains only of the following symptoms, and all other reviewed systems are negative.  FSS 18 ESS 7  ALLERGIES: Allergies  Allergen Reactions   Strawberry Extract Anaphylaxis        Dilaudid [Hydromorphone Hcl] Nausea And Vomiting    HOME MEDICATIONS: Outpatient Medications Prior to Visit  Medication Sig Dispense Refill   albuterol (VENTOLIN HFA) 108 (90 Base) MCG/ACT inhaler Inhale 2 puffs into the lungs every 6 (six) hours as needed for wheezing or shortness of breath. 8 g 0   ALPRAZolam (XANAX) 0.5 MG tablet TAKE 1 TO 2 TABLETS BY MOUTH EVERY EVENING AT BEDTIME 60 tablet 1   amoxicillin (AMOXIL) 500 MG capsule TAKE 4 CAPSULES BY MOUTH 1 HOUR PRIOR TO DENTAL APPT 16 capsule 0   ARTHRITIS PAIN RELIEF 650 MG CR tablet Take 650 mg by mouth every 8 (eight) hours as needed.     aspirin EC 81 MG tablet Take 81 mg by mouth daily.     cetirizine (ZYRTEC) 10 MG tablet Take 10 mg by mouth at bedtime.     clopidogrel (PLAVIX) 75 MG tablet TAKE 1 TABLET BY MOUTH ONCE DAILY 90 tablet 3   COVID-19 At Home Antigen Test (CARESTART COVID-19 HOME TEST) KIT Use as directed within package instructions. (Patient not taking: No sig reported) 4 each 0   COVID-19 mRNA Vac-TriS, Pfizer, (PFIZER-BIONT COVID-19 VAC-TRIS) SUSP injection Inject into the muscle. (Patient not taking: No  sig reported) 0.3 mL 0   desonide (DESOWEN) 0.05 % cream Apply 1 application topically 2 (two) times daily as needed (for psoriasis). 30 g 2   ezetimibe (ZETIA) 10 MG tablet TAKE 1 TABLET BY MOUTH ONCE A DAY 90 tablet 3   influenza vaccine adjuvanted (FLUAD QUADRIVALENT) 0.5 ML injection Inject into the muscle. (Patient not taking: Reported on 01/25/2021) 0.5 mL 0   influenza vaccine adjuvanted (FLUAD) 0.5 ML injection USE AS DIRECTED (Patient not taking: Reported on 01/25/2021) .5 mL 0   isosorbide mononitrate (IMDUR) 30 MG 24 hr tablet TAKE 1 TABLET BY MOUTH ONCE DAILY 90 tablet 1   LEXAPRO 20 MG tablet TAKE 1 TABLET BY MOUTH DAILY. 90 tablet 0   metoprolol tartrate (LOPRESSOR) 25 MG tablet TAKE 1 TABLET BY MOUTH 2 TIMES DAILY 180 tablet 3   nitroGLYCERIN (NITROSTAT) 0.4 MG SL tablet PLACE 1 TABLET (0.4 MG TOTAL) UNDER THE TONGUE EVERY 5 (FIVE) MINUTES AS NEEDED. MAY REPEAT FOR UP TO 3 DOSES. IF NO RELIEF WITH 1ST DOSE, CALL 911. 25 tablet 1   pantoprazole (PROTONIX) 20 MG tablet Take 1 tablet by mouth once a day 90 tablet 3   RANEXA 1000 MG SR tablet TAKE 1 TABLET BY MOUTH 2 TIMES DAILY 180 tablet 0   rosuvastatin (CRESTOR) 40 MG tablet TAKE 1  TABLET BY MOUTH ONCE A DAY 90 tablet 3   tacrolimus (PROTOPIC) 0.1 % ointment Apply twice daily to affected areas as needed 30 g 2   No facility-administered medications prior to visit.    PAST MEDICAL HISTORY: Past Medical History:  Diagnosis Date   Brachial plexopathy    CKD (chronic kidney disease), stage II - III ([place])    Coronary artery disease    a. s/p 4 vessel CABG in 05/2008 w/ LIMA-LAD, SVG-Diag, sequential SVG-OM1/OM2; b. s/p PCI/DES x 2 to LAD in 10/2009; c. LHC 03/2013 stable disease and patent LAD stents; d. 10/2016 Cath: LM min irregs, LAD 20p, 39mISR, D2 80ost, LCX small, min irregs, OM1/2/3 min irregs, RCA/RPDA/RPAV min irregs, RPL1/2 nl RPL3 min irregs, VG->D2 nl, VG->OM1->OM2 100, EF 55-65%.   H/O maze procedure 05/17/2008   a. 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    Hypertension    Memory deficit after cerebral infarction    OSA (obstructive sleep apnea)    Overweight(278.02)    PAF (paroxysmal atrial fibrillation) ([facility]    a. s/p Maze 05/2008, previously on Eliquis->discontinued 05/2016; c. CHADS2VASc => 4 (HTN, stroke x 2, vascular disease)   Patent foramen ovale    a. 05/2008 s/p closure @ time of CABG.   Psoriatic arthritis ([facility]    Skin cancer    Sleep apnea    Stroke ([facility]    a. 07/2005 right brain; b. 05/2005 left brain; c. Resultant memory deficits.   Thrombocytopenia ([facility]     PAST SURGICAL HISTORY: Past Surgical History:  Procedure Laterality Date   ACUTE PANCREATITIS  5/11   APPENDECTOMY  1984   CARDIAC CATHETERIZATION     CHOLECYSTECTOMY  08/19/2009   COLONOSCOPY WITH PROPOFOL N/A 06/07/2017   Procedure: COLONOSCOPY WITH PROPOFOL;  Surgeon: TVirgel Manifold MD;  Location: [facility] ENDOSCOPY;  Service: Endoscopy;  Laterality: N/A;   CORONARY ARTERY BYPASS GRAFT  05/12/2008   x4 by PeterVan Trigt,MD   ESOPHAGOGASTRODUODENOSCOPY N/A 03/03/2020   Procedure: ESOPHAGOGASTRODUODENOSCOPY (EGD);  Surgeon: TVirgel Manifold MD;  [facility];  Service: Endoscopy;  Laterality: N/A;   LEFT HEART CATH AND CORONARY ANGIOGRAPHY N/A 11/29/2016   Procedure: LEFT HEART CATH AND CORONARY ANGIOGRAPHY;  Surgeon: AWellington Hampshire MD;  [facility];  Service: Cardiovascular;  Laterality: N/A;   PATENT FORAMEN OVALE CLOSURE     TONSILLECTOMY     AS A CHILD    FAMILY HISTORY: Family History  Problem Relation Age of Onset   Diabetes Sister    Ovarian cancer Sister    Psychiatric Illness Father    Heart attack Father    Diabetes Other    Hypertension Mother    Heart disease Mother    Lung cancer Paternal Aunt    Coronary artery disease Other    Diabetes Other    Hypertension Other    Hyperlipidemia Other     Cancer Paternal Uncle    Heart disease Paternal Grandmother    Heart disease Paternal Grandfather     SOCIAL HISTORY: Social History   Socioeconomic History   Marital status: Married    Spouse name: SWynona Canes  Number of children: 3   Years of education: Not on file   Highest education level: Not on file  Occupational History   Occupation: Full time    Employer: [employer]  Tobacco Use   Smoking status: Never   Smokeless tobacco: Never  Vaping Use   Vaping Use: Never used  Substance and Sexual Activity   Alcohol use: Yes    Comment: occassionally   Drug use: No   Sexual activity: Not on file  Other Topics Concern   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 Went to high school. He has worked at his present job for 22 years. He has been married for 34 years. They have 3 children. 2 of his daughters live in [place]. He drinks less than two cups of caffeine per day. He does not use  tobacco or recreational drugs. He has rare alcohol intake. Lives with wife and daughter      OSA diagnosed at [facility] , had his  sleep studies there  reviewed them all in detail today he  has retrognathia, sinusitis,  rhinitis      His ESS remains very high  at 16 and FSS at 94 . His falls assessment tool score is 5.   nasonex, refitted mask, the recent  HST failed to document that  apnea is present at all. education about REM BD and EDS, narcolepsy , cataplexy.  45 minutes.    Social Determinants of Health   Financial Resource Strain: Not on file  Food Insecurity: Not on file  Transportation Needs: Not on file  Physical Activity: Not on file  Stress: Not on file  Social Connections: Not on file  Intimate Partner Violence: Not on file      PHYSICAL EXAM  Vitals:   01/30/21 1108  BP: 114/66  Pulse: (!) 51  Weight: 196 lb (88.9 kg)  Height: _0  (1.676  m)   Body mass index is 31.64 kg/m.  Generalized: Well developed, in no acute distress  Chest: Lungs clear to auscultation bilaterally  Neurological examination  Mentation: Alert oriented to time, place, history taking. Follows all commands speech and language fluent Cranial nerve II-XII: Extraocular movements were full, visual field were full on confrontational test Head turning and shoulder shrug  were normal and symmetric. Motor: The motor testing reveals 5 over 5 strength of all 4 extremities. Good symmetric motor tone is noted throughout.  Sensory: Sensory testing is intact to soft touch on all 4 extremities. No evidence of extinction is noted.  Gait and station: Gait is normal.    DIAGNOSTIC DATA (LABS, IMAGING, TESTING) - I reviewed patient records, labs, notes, testing and imaging myself where available.  Lab Results  Component Value Date   WBC 7.0 01/24/2021   HGB 12.9 (L) 01/24/2021   HCT 38.0 (L) 01/24/2021   MCV 93.8 01/24/2021   PLT 179 01/24/2021      Component Value Date/Time   NA 142 12/08/2020 0948   NA 141 03/18/2013 1739   K 4.6 12/08/2020 0948   K 4.0 03/18/2013 1739   CL 105 12/08/2020 0948   CL 107 03/18/2013 1739   CO2 23 12/08/2020 0948   CO2 30 03/18/2013 1739   GLUCOSE 108 (H) 12/08/2020 0948   GLUCOSE 106 (H) 08/24/2020 1218   GLUCOSE 79 03/18/2013 1739   BUN 20 12/08/2020 0948   BUN 23 (H) 03/18/2013 1739   CREATININE 1.15 12/08/2020 0948   CREATININE 1.50 (H) 12/18/2013 1008   CALCIUM 9.2 12/08/2020 0948   CALCIUM 9.2 03/18/2013 1739   PROT 6.6 12/08/2020 0948  ALBUMIN 4.5 12/08/2020 0948   AST 20 12/08/2020 0948   ALT 14 12/08/2020 0948   ALKPHOS 48 12/08/2020 0948   BILITOT 0.5 12/08/2020 0948   GFRNONAA >60 08/24/2020 1218   GFRNONAA 50 (L) 12/18/2013 1008   GFRAA 63 03/29/2020 1027   GFRAA 58 (L) 12/18/2013 1008   Lab Results  Component Value Date   CHOL 108 12/08/2020   HDL 54 12/08/2020   LDLCALC 41 12/08/2020   TRIG 54  12/08/2020   CHOLHDL 2.0 12/08/2020   Lab Results  Component Value Date   HGBA1C  05/11/2008    5.5 (NOTE)   The ADA recommends the following therapeutic goal for glycemic   control related to Hgb A1C measurement:   Goal of Therapy:   < 7.0% Hgb A1C   Reference: American Diabetes Association: Clinical Practice   Recommendations 2008, Diabetes Care,  2008, 31:(Suppl 1).   Lab Results  Component Value Date   VITAMINB12 218 01/24/2021   Lab Results  Component Value Date   TSH 2.210 12/08/2020      ASSESSMENT AND PLAN 65 y.o. year old male  has a past medical history of Brachial plexopathy, CKD (chronic kidney disease), stage II - III ([place]), Coronary artery disease, H/O maze procedure (05/17/2008), Hyperlipidemia, Hypersomnia, organic (09/24/2012), Hypertension, Memory deficit after cerebral infarction, OSA (obstructive sleep apnea), Overweight(278.02), PAF (paroxysmal atrial fibrillation) ([place]), Patent foramen ovale, Psoriatic arthritis ([place]), Skin cancer, Sleep apnea, Stroke ([place]), and Thrombocytopenia ([place]). here with:  OSA on CPAP  - CPAP compliance excellent - Good treatment of AHI  - Encourage patient to use CPAP nightly and > 4 hours each night - F/U in 1 year or sooner if needed    Ward Givens, MSN, NP-C 01/30/2021, 11:06 AM [facility] [address] [place], [place] [address] [phone]

## 2021-01-30 NOTE — Patient Instructions (Signed)
Continue using CPAP nightly and greater than 4 hours each night °If your symptoms worsen or you develop new symptoms please let us know.  ° °

## 2021-01-31 ENCOUNTER — Ambulatory Visit (INDEPENDENT_AMBULATORY_CARE_PROVIDER_SITE_OTHER): Payer: 59 | Admitting: Physician Assistant

## 2021-01-31 ENCOUNTER — Other Ambulatory Visit (HOSPITAL_COMMUNITY): Payer: Self-pay

## 2021-01-31 ENCOUNTER — Encounter: Payer: Self-pay | Admitting: Physician Assistant

## 2021-01-31 ENCOUNTER — Other Ambulatory Visit: Payer: Self-pay

## 2021-01-31 VITALS — BP 107/59 | HR 65 | Temp 98.1°F | Ht 66.0 in | Wt 197.2 lb

## 2021-01-31 DIAGNOSIS — R361 Hematospermia: Secondary | ICD-10-CM

## 2021-01-31 LAB — POCT URINALYSIS DIPSTICK
Bilirubin, UA: NEGATIVE
Blood, UA: NEGATIVE
Glucose, UA: NEGATIVE
Ketones, UA: NEGATIVE
Leukocytes, UA: NEGATIVE
Nitrite, UA: NEGATIVE
Protein, UA: NEGATIVE
Spec Grav, UA: 1.025 (ref 1.010–1.025)
Urobilinogen, UA: 0.2 E.U./dL
pH, UA: 5 (ref 5.0–8.0)

## 2021-01-31 NOTE — Progress Notes (Signed)
Established patient visit   Patient: Kevin [name]   DOB: 1956/03/21   65 y.o. Male  MRN: [id number] Visit Date: 01/31/2021  Today's healthcare provider: Mikey Kirschner, PA-C   No chief complaint on file.  Subjective    HPI  Kevin [name] is a 65 y/o male who presents today with two episodes of hematospermia. He states on the 15th/16th of October he noticed some 'old clotted blood' in his semen. A few days later this occurred again. He had recently returned from a month-long trip to [place]. He reports a light pressure in his penis/lower abdomen. Denies dysuria, increased frequency of urination. Denies fevers/chills. He reports a small amount of swelling in his testicles--no associated pain, mass, color change. Reports unprotected sexual activity, but unchanged for 40+ years. Denies history of prostate issues.    Medications: Outpatient Medications Prior to Visit  Medication Sig   albuterol (VENTOLIN HFA) 108 (90 Base) MCG/ACT inhaler Inhale 2 puffs into the lungs every 6 (six) hours as needed for wheezing or shortness of breath.   ALPRAZolam (XANAX) 0.5 MG tablet TAKE 1 TO 2 TABLETS BY MOUTH EVERY EVENING AT BEDTIME   amoxicillin (AMOXIL) 500 MG capsule TAKE 4 CAPSULES BY MOUTH 1 HOUR PRIOR TO DENTAL APPT   ARTHRITIS PAIN RELIEF 650 MG CR tablet Take 650 mg by mouth every 8 (eight) hours as needed.   aspirin EC 81 MG tablet Take 81 mg by mouth daily.   cetirizine (ZYRTEC) 10 MG tablet Take 10 mg by mouth at bedtime.   clopidogrel (PLAVIX) 75 MG tablet TAKE 1 TABLET BY MOUTH ONCE DAILY   COVID-19 At Home Antigen Test (CARESTART COVID-19 HOME TEST) KIT Use as directed within package instructions.   COVID-19 mRNA Vac-TriS, Pfizer, (PFIZER-BIONT COVID-19 VAC-TRIS) SUSP injection Inject into the muscle.   desonide (DESOWEN) 0.05 % cream Apply 1 application topically 2 (two) times daily as needed (for psoriasis).   ezetimibe (ZETIA) 10 MG tablet TAKE 1 TABLET BY MOUTH ONCE A DAY   influenza  vaccine adjuvanted (FLUAD QUADRIVALENT) 0.5 ML injection Inject into the muscle.   influenza vaccine adjuvanted (FLUAD) 0.5 ML injection USE AS DIRECTED   isosorbide mononitrate (IMDUR) 30 MG 24 hr tablet TAKE 1 TABLET BY MOUTH ONCE DAILY   LEXAPRO 20 MG tablet TAKE 1 TABLET BY MOUTH DAILY.   metoprolol tartrate (LOPRESSOR) 25 MG tablet TAKE 1 TABLET BY MOUTH 2 TIMES DAILY   nitroGLYCERIN (NITROSTAT) 0.4 MG SL tablet PLACE 1 TABLET (0.4 MG TOTAL) UNDER THE TONGUE EVERY 5 (FIVE) MINUTES AS NEEDED. MAY REPEAT FOR UP TO 3 DOSES. IF NO RELIEF WITH 1ST DOSE, CALL 911.   pantoprazole (PROTONIX) 20 MG tablet Take 1 tablet by mouth once a day   RANEXA 1000 MG SR tablet TAKE 1 TABLET BY MOUTH 2 TIMES DAILY   rosuvastatin (CRESTOR) 40 MG tablet TAKE 1 TABLET BY MOUTH ONCE A DAY   tacrolimus (PROTOPIC) 0.1 % ointment Apply twice daily to affected areas as needed   No facility-administered medications prior to visit.    Review of Systems  Constitutional:  Negative for fatigue and fever.  Endocrine: Negative for polyuria.  Genitourinary:  Positive for penile discharge and scrotal swelling. Negative for dysuria, frequency, penile pain, penile swelling, testicular pain and urgency.  All other systems reviewed and are negative.     Objective    Blood pressure (!) 107/59, pulse 65, temperature 98.1 F (36.7 C), height '5\' 6"'  (1.676 m), weight 197 lb  3.2 oz (89.4 kg).   Physical Exam Constitutional:      General: He is awake.     Appearance: He is well-developed.  HENT:     Head: Normocephalic.  Eyes:     Conjunctiva/sclera: Conjunctivae normal.  Cardiovascular:     Rate and Rhythm: Normal rate and regular rhythm.     Heart sounds: Normal heart sounds.  Pulmonary:     Effort: Pulmonary effort is normal.     Breath sounds: Normal breath sounds.  Abdominal:     General: Abdomen is flat.     Palpations: Abdomen is soft. There is no mass.     Tenderness: There is no abdominal tenderness. There is  no guarding or rebound.  Skin:    General: Skin is warm.  Neurological:     Mental Status: He is alert and oriented to person, place, and time.  Psychiatric:        Attention and Perception: Attention normal.        Mood and Affect: Mood normal.        Speech: Speech normal.        Behavior: Behavior is cooperative.   Pt deferred testicular exam. Reports no pain, color change, masses.   No results found for any visits on 01/31/21.  Assessment & Plan     Hematospermia Urine dip - leuk -RBC Pt deferred testicular exam today. Ref to urology.  No follow-ups on file.      I, Mikey Kirschner, PA-C have reviewed all documentation for this visit. The documentation on  01/31/2021 for the exam, diagnosis, procedures, and orders are all accurate and complete.    Mikey Kirschner, PA-C  [facility] [phone] (phone) [phone] (fax)  [place]

## 2021-01-31 NOTE — Progress Notes (Signed)
Subjective:  Patient ID: Kevin [name], male    DOB: [birth year],  MRN: [id number]  Chief Complaint  Patient presents with   Foot Orthotics    Pt stated that he is doing great    65 y.o. male presents with the above complaint.  Patient presents with follow-up to right first MPJ pain/sesamoiditis.  He states is doing a lot better and the orthotics have been fitting well.  His pain is very mild in nature now.   Review of Systems: Negative except as noted in the HPI. Denies N/V/F/Ch.  Past Medical History:  Diagnosis Date   Brachial plexopathy    CKD (chronic kidney disease), stage II - III ([place])    Coronary artery disease    a. s/p 4 vessel CABG in 05/2008 w/ LIMA-LAD, SVG-Diag, sequential SVG-OM1/OM2; b. s/p PCI/DES x 2 to LAD in 10/2009; c. LHC 03/2013 stable disease and patent LAD stents; d. 10/2016 Cath: LM min irregs, LAD 20p, 31mISR, D2 80ost, LCX small, min irregs, OM1/2/3 min irregs, RCA/RPDA/RPAV min irregs, RPL1/2 nl RPL3 min irregs, VG->D2 nl, VG->OM1->OM2 100, EF 55-65%.   H/O maze procedure 05/17/2008   a. 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    Hypertension    Memory deficit after cerebral infarction    OSA (obstructive sleep apnea)    Overweight(278.02)    PAF (paroxysmal atrial fibrillation) ([facility]    a. s/p Maze 05/2008, previously on Eliquis->discontinued 05/2016; c. CHADS2VASc => 4 (HTN, stroke x 2, vascular disease)   Patent foramen ovale    a. 05/2008 s/p closure @ time of CABG.   [place]atic [place] ([facility]    Skin cancer    Sleep apnea    Stroke ([facility]    a. 07/2005 right brain; b. 05/2005 left brain; c. Resultant memory deficits.   Thrombocytopenia ([facility])     Current Outpatient Medications:    albuterol (VENTOLIN HFA) 108 (90 Base) MCG/ACT inhaler, Inhale 2 puffs into the lungs every 6 (six) hours as needed for wheezing or shortness of breath., Disp: 8 g, Rfl: 0   ALPRAZolam (XANAX) 0.5 MG  tablet, TAKE 1 TO 2 TABLETS BY MOUTH EVERY EVENING AT BEDTIME, Disp: 60 tablet, Rfl: 1   amoxicillin (AMOXIL) 500 MG capsule, TAKE 4 CAPSULES BY MOUTH 1 HOUR PRIOR TO DENTAL APPT, Disp: 16 capsule, Rfl: 0   ARTHRITIS PAIN RELIEF 650 MG CR tablet, Take 650 mg by mouth every 8 (eight) hours as needed., Disp: , Rfl:    aspirin EC 81 MG tablet, Take 81 mg by mouth daily., Disp: , Rfl:    cetirizine (ZYRTEC) 10 MG tablet, Take 10 mg by mouth at bedtime., Disp: , Rfl:    clopidogrel (PLAVIX) 75 MG tablet, TAKE 1 TABLET BY MOUTH ONCE DAILY, Disp: 90 tablet, Rfl: 3   COVID-19 At Home Antigen Test (CARESTART COVID-19 HOME TEST) KIT, Use as directed within package instructions., Disp: 4 each, Rfl: 0   COVID-19 mRNA Vac-TriS, Pfizer, (PFIZER-BIONT COVID-19 VAC-TRIS) SUSP injection, Inject into the muscle., Disp: 0.3 mL, Rfl: 0   desonide (DESOWEN) 0.05 % cream, Apply 1 application topically 2 (two) times daily as needed (for psoriasis)., Disp: 30 g, Rfl: 2   ezetimibe (ZETIA) 10 MG tablet, TAKE 1 TABLET BY MOUTH ONCE A DAY, Disp: 90 tablet, Rfl: 3   influenza vaccine adjuvanted (FLUAD QUADRIVALENT) 0.5 ML injection, Inject into the muscle.,  Disp: 0.5 mL, Rfl: 0   influenza vaccine adjuvanted (FLUAD) 0.5 ML injection, USE AS DIRECTED, Disp: .5 mL, Rfl: 0   isosorbide mononitrate (IMDUR) 30 MG 24 hr tablet, TAKE 1 TABLET BY MOUTH ONCE DAILY, Disp: 90 tablet, Rfl: 1   LEXAPRO 20 MG tablet, TAKE 1 TABLET BY MOUTH DAILY., Disp: 90 tablet, Rfl: 0   metoprolol tartrate (LOPRESSOR) 25 MG tablet, TAKE 1 TABLET BY MOUTH 2 TIMES DAILY, Disp: 180 tablet, Rfl: 3   nitroGLYCERIN (NITROSTAT) 0.4 MG SL tablet, PLACE 1 TABLET (0.4 MG TOTAL) UNDER THE TONGUE EVERY 5 (FIVE) MINUTES AS NEEDED. MAY REPEAT FOR UP TO 3 DOSES. IF NO RELIEF WITH 1ST DOSE, CALL 911., Disp: 25 tablet, Rfl: 1   pantoprazole (PROTONIX) 20 MG tablet, Take 1 tablet by mouth once a day, Disp: 90 tablet, Rfl: 3   RANEXA 1000 MG SR tablet, TAKE 1 TABLET BY  MOUTH 2 TIMES DAILY, Disp: 180 tablet, Rfl: 0   rosuvastatin (CRESTOR) 40 MG tablet, TAKE 1 TABLET BY MOUTH ONCE A DAY, Disp: 90 tablet, Rfl: 3   tacrolimus (PROTOPIC) 0.1 % ointment, Apply twice daily to affected areas as needed, Disp: 30 g, Rfl: 2  Social History   Tobacco Use  Smoking Status Never  Smokeless Tobacco Never    Allergies  Allergen Reactions   Strawberry Extract Anaphylaxis        Dilaudid [Hydromorphone Hcl] Nausea And Vomiting   Objective:  There were no vitals filed for this visit. There is no height or weight on file to calculate BMI. Constitutional Well developed. Well nourished.  Vascular Dorsalis pedis pulses palpable bilaterally. Posterior tibial pulses palpable bilaterally. Capillary refill normal to all digits.  No cyanosis or clubbing noted. Pedal hair growth normal.  Neurologic Normal speech. Oriented to person, place, and time. Epicritic sensation to light touch grossly present bilaterally.  Dermatologic Nails well groomed and normal in appearance. No open wounds. No skin lesions.  Orthopedic: Very mild pain on palpation noted to the right sesamoidal complex.  No pain with range of motion of the first metatarsophalangeal joint.  No deep intra-articular first MPJ pain noted.  Negative flexor extensor tendinitis noted.   Radiographs:3 views of skeletally mature adult right foot: No fractures noted.  Sesamoidal complex intact.  No bony abnormalities identified.  Pes cavus foot structure noted with decreasing calcaneal clinician angle bullet hole sinus tarsi decrease in talar declination angle.  Bipartite sesamoids noted. Assessment:   1. Sesamoiditis of right foot   2. Pes cavus     Plan:  Patient was evaluated and treated and all questions answered.  Right sesamoiditis -Clinically improving I will hold off on any further injection as the orthotics are seem to be taking the stress away.  Pes cavus -I explained the patient the etiology of pes  cavus foot structure and various treatment options were discussed.  Given the amount of pain that he is having I believe patient will benefit from custom-made orthotics to help support the high arch foot structure with offloading of the first metatarsal with dancers pad to take the stress off of the sesamoid.  Patient states understand would like to obtain orthotics -Orthotics are functioning well with offloading of the first metatarsophalangeal joint.   No follow-ups on file.

## 2021-02-01 ENCOUNTER — Other Ambulatory Visit (HOSPITAL_COMMUNITY): Payer: Self-pay

## 2021-02-02 ENCOUNTER — Other Ambulatory Visit (HOSPITAL_COMMUNITY): Payer: Self-pay

## 2021-02-03 ENCOUNTER — Other Ambulatory Visit (HOSPITAL_COMMUNITY): Payer: Self-pay

## 2021-02-06 ENCOUNTER — Other Ambulatory Visit (HOSPITAL_COMMUNITY): Payer: Self-pay

## 2021-02-06 ENCOUNTER — Encounter: Payer: Self-pay | Admitting: Urology

## 2021-02-06 ENCOUNTER — Ambulatory Visit: Payer: 59 | Admitting: Urology

## 2021-02-06 ENCOUNTER — Ambulatory Visit (INDEPENDENT_AMBULATORY_CARE_PROVIDER_SITE_OTHER): Payer: 59 | Admitting: Urology

## 2021-02-06 ENCOUNTER — Other Ambulatory Visit: Payer: Self-pay

## 2021-02-06 VITALS — BP 143/84 | HR 78 | Ht 66.0 in | Wt 193.7 lb

## 2021-02-06 DIAGNOSIS — R361 Hematospermia: Secondary | ICD-10-CM | POA: Diagnosis not present

## 2021-02-06 LAB — URINALYSIS, COMPLETE
Bilirubin, UA: NEGATIVE
Glucose, UA: NEGATIVE
Leukocytes,UA: NEGATIVE
Nitrite, UA: NEGATIVE
Protein,UA: NEGATIVE
RBC, UA: NEGATIVE
Specific Gravity, UA: >1.03 — ABNORMAL HIGH (ref 1.005–1.030)
Urobilinogen, Ur: 0.2 mg/dL (ref 0.2–1.0)
pH, UA: 5.5 (ref 5.0–7.5)

## 2021-02-06 LAB — BLADDER SCAN AMB NON-IMAGING

## 2021-02-06 LAB — MICROSCOPIC EXAMINATION
Bacteria, UA: NONE SEEN
Epithelial Cells (non renal): NONE SEEN /hpf (ref 0–10)

## 2021-02-06 NOTE — Progress Notes (Signed)
02/06/21 8:30 AM   [place] Jan 30, 1956 [id number]  CC: Hematospermia  HPI: 65 year old male on Plavix who reports 2 episodes of hematospermia in mid October after a long flight back from [place].  This was not painful.  He denies any urinary symptoms or gross hematuria.  He thinks he may have had some testicle swelling at that time.  He was also treated with Augmentin around that time for possible diverticulitis.  Urinalysis today completely benign, PSA in September 2022 normal at 0.3 and stable over the last 5 years.  CT from June 2022 for abdominal pain was benign, and prostate measured 34 g with no hydronephrosis, and decompressed bladder.  PMH: Past Medical History:  Diagnosis Date   Brachial plexopathy    CKD (chronic kidney disease), stage II - III ([place])    Coronary artery disease    a. s/p 4 vessel CABG in 05/2008 w/ LIMA-LAD, SVG-Diag, sequential SVG-OM1/OM2; b. s/p PCI/DES x 2 to LAD in 10/2009; c. LHC 03/2013 stable disease and patent LAD stents; d. 10/2016 Cath: LM min irregs, LAD 20p, 32m ISR, D2 80ost, LCX small, min irregs, OM1/2/3 min irregs, RCA/RPDA/RPAV min irregs, RPL1/2 nl RPL3 min irregs, VG->D2 nl, VG->OM1->OM2 100, EF 55-65%.   H/O maze procedure 05/17/2008   a. 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    Hypertension    Memory deficit after cerebral infarction    OSA (obstructive sleep apnea)    Overweight(278.02)    PAF (paroxysmal atrial fibrillation) ([place])    a. s/p Maze 05/2008, previously on Eliquis->discontinued 05/2016; c. CHADS2VASc => 4 (HTN, stroke x 2, vascular disease)   Patent foramen ovale    a. 05/2008 s/p closure @ time of CABG.   Psoriatic arthritis ([place])    Skin cancer    Sleep apnea    Stroke [facility])    a. 07/2005 right brain; b. 05/2005 left brain; c. Resultant memory deficits.   Thrombocytopenia [facility])     Surgical History: Past Surgical History:  Procedure  Laterality Date   ACUTE PANCREATITIS  5/11   APPENDECTOMY  1984   CARDIAC CATHETERIZATION     CHOLECYSTECTOMY  08/19/2009   COLONOSCOPY WITH PROPOFOL N/A 06/07/2017   Procedure: COLONOSCOPY WITH PROPOFOL;  Surgeon: Virgel Manifold, MD;  Location: [facility] ENDOSCOPY;  Service: Endoscopy;  Laterality: N/A;   CORONARY ARTERY BYPASS GRAFT  05/12/2008   x4 by PeterVan Trigt,MD   ESOPHAGOGASTRODUODENOSCOPY N/A 03/03/2020   Procedure: ESOPHAGOGASTRODUODENOSCOPY (EGD);  Surgeon: Virgel Manifold, MD;  Location: [facility] ENDOSCOPY;  Service: Endoscopy;  Laterality: N/A;   LEFT HEART CATH AND CORONARY ANGIOGRAPHY N/A 11/29/2016   Procedure: LEFT HEART CATH AND CORONARY ANGIOGRAPHY;  Surgeon: Wellington Hampshire, MD;  Location: [place] CV LAB;  Service: Cardiovascular;  Laterality: N/A;   PATENT FORAMEN OVALE CLOSURE     TONSILLECTOMY     AS A CHILD     Family History: Family History  Problem Relation Age of Onset   Hypertension Mother    Heart disease Mother    Psychiatric Illness Father    Heart attack Father    Diabetes Sister    Ovarian cancer Sister    Lung cancer Paternal Aunt    Cancer Paternal Uncle    Heart disease Paternal Grandmother    Heart disease Paternal Grandfather    Diabetes Other    Coronary artery disease Other  Diabetes Other    Hypertension Other    Hyperlipidemia Other    Sleep apnea Neg Hx     Social History:  reports that he has never smoked. He has never used smokeless tobacco. He reports current alcohol use. He reports that he does not use drugs.  Physical Exam: BP (!) 143/84 (BP Location: Left Arm, Patient Position: Sitting, Cuff Size: Normal)   Pulse 78   Ht 5\' 6"  (1.676 m)   Wt 193 lb 11.2 oz (87.9 kg)   BMI 31.26 kg/m    Constitutional:  Alert and oriented, No acute distress. Cardiovascular: No clubbing, cyanosis, or edema. Respiratory: Normal respiratory effort, no increased work of breathing. GI: Abdomen is soft, nontender, nondistended,  no abdominal masses GU: Phallus with patent meatus, no lesions, testicles 20 cc and descended bilaterally without masses, nontender DRE: 40 g smooth prostate, no nodules or masses, nontender   Laboratory Data: Reviewed, see HPI  Pertinent Imaging: I have personally viewed and interpreted the CT from June 2022 with no hydronephrosis or stones, normal-appearing bladder and prostate.  Assessment & Plan:   65 year old male with 2 episodes of painless hematospermia that has resolved, urinalysis and PVR benign, PSA normal, DRE benign.  We discussed hematospermia is typically a benign condition, and return precautions discussed extensively  Follow-up as needed  Nickolas Madrid, MD 02/06/2021  [place] [address] [place], [place] [address] [phone]

## 2021-02-07 ENCOUNTER — Other Ambulatory Visit (HOSPITAL_COMMUNITY): Payer: Self-pay

## 2021-02-10 ENCOUNTER — Other Ambulatory Visit (HOSPITAL_COMMUNITY): Payer: Self-pay

## 2021-02-10 ENCOUNTER — Other Ambulatory Visit: Payer: Self-pay | Admitting: Cardiovascular Disease

## 2021-02-10 MED ORDER — ISOSORBIDE MONONITRATE ER 30 MG PO TB24
ORAL_TABLET | Freq: Every day | ORAL | 1 refills | Status: DC
  Filled 2021-02-10: qty 90, 90d supply, fill #0
  Filled 2021-03-21: qty 90, 90d supply, fill #1

## 2021-02-10 MED FILL — Metoprolol Tartrate Tab 25 MG: ORAL | 90 days supply | Qty: 180 | Fill #2 | Status: AC

## 2021-02-10 MED FILL — Rosuvastatin Calcium Tab 40 MG: ORAL | 90 days supply | Qty: 90 | Fill #2 | Status: AC

## 2021-02-10 MED FILL — Ezetimibe Tab 10 MG: ORAL | 90 days supply | Qty: 90 | Fill #2 | Status: AC

## 2021-02-13 ENCOUNTER — Ambulatory Visit: Payer: 59 | Admitting: Surgery

## 2021-02-15 ENCOUNTER — Other Ambulatory Visit: Payer: Self-pay

## 2021-02-15 ENCOUNTER — Ambulatory Visit (INDEPENDENT_AMBULATORY_CARE_PROVIDER_SITE_OTHER): Payer: 59 | Admitting: Surgery

## 2021-02-15 ENCOUNTER — Encounter: Payer: Self-pay | Admitting: Oncology

## 2021-02-15 ENCOUNTER — Inpatient Hospital Stay: Payer: 59 | Attending: Oncology | Admitting: Oncology

## 2021-02-15 ENCOUNTER — Encounter: Payer: Self-pay | Admitting: Surgery

## 2021-02-15 VITALS — BP 123/74 | HR 66 | Temp 97.9°F | Ht 66.0 in | Wt 197.2 lb

## 2021-02-15 DIAGNOSIS — E538 Deficiency of other specified B group vitamins: Secondary | ICD-10-CM | POA: Diagnosis not present

## 2021-02-15 DIAGNOSIS — K5732 Diverticulitis of large intestine without perforation or abscess without bleeding: Secondary | ICD-10-CM | POA: Diagnosis not present

## 2021-02-15 DIAGNOSIS — D649 Anemia, unspecified: Secondary | ICD-10-CM

## 2021-02-15 NOTE — Patient Instructions (Addendum)
You have been placed on the recall list. Someone will call you in January to schedule your February appointment.   If you have any concerns or questions, please feel free to call our office.  Diverticulitis Diverticulitis is when small pouches in your colon (large intestine) get infected or swollen. This causes pain in the belly (abdomen) and watery poop (diarrhea). These pouches are called diverticula. The pouches form in people who have a condition called diverticulosis. What are the causes? This condition may be caused by poop (stool) that gets trapped in the pouches in your colon. The poop lets germs (bacteria) grow in the pouches. This causes the infection. What increases the risk? You are more likely to get this condition if you have small pouches in your colon. The risk is higher if: You are overweight or very overweight (obese). You do not exercise enough. You drink alcohol. You smoke or use products with tobacco in them. You eat a diet that has a lot of red meat such as beef, pork, or lamb. You eat a diet that does not have enough fiber in it. You are older than 66 years of age. What are the signs or symptoms? Pain in the belly. Pain is often on the left side, but it may be in other areas. Fever and feeling cold. Feeling like you may vomit. Vomiting. Having cramps. Feeling full. Changes to how often you poop. Blood in your poop. How is this treated? Most cases are treated at home by: Taking over-the-counter pain medicines. Following a clear liquid diet. Taking antibiotic medicines. Resting. Very bad cases may need to be treated at a hospital. This may include: Not eating or drinking. Taking prescription pain medicine. Getting antibiotic medicines through an IV tube. Getting fluid and food through an IV tube. Having surgery. When you are feeling better, your doctor may tell you to have a test to check your colon (colonoscopy). Follow these instructions at  home: Medicines Take over-the-counter and prescription medicines only as told by your doctor. These include: Antibiotics. Pain medicines. Fiber pills. Probiotics. Stool softeners. If you were prescribed an antibiotic medicine, take it as told by your doctor. Do not stop taking the antibiotic even if you start to feel better. Ask your doctor if the medicine prescribed to you requires you to avoid driving or using machinery. Eating and drinking  Follow a diet as told by your doctor. When you feel better, your doctor may tell you to change your diet. You may need to eat a lot of fiber. Fiber makes it easier to poop (have a bowel movement). Foods with fiber include: Berries. Beans. Lentils. Green vegetables. Avoid eating red meat. General instructions Do not use any products that contain nicotine or tobacco, such as cigarettes, e-cigarettes, and chewing tobacco. If you need help quitting, ask your doctor. Exercise 3 or more times a week. Try to get 30 minutes each time. Exercise enough to sweat and make your heart beat faster. Keep all follow-up visits as told by your doctor. This is important. Contact a doctor if: Your pain does not get better. You are not pooping like normal. Get help right away if: Your pain gets worse. Your symptoms do not get better. Your symptoms get worse very fast. You have a fever. You vomit more than one time. You have poop that is: Bloody. Black. Tarry. Summary This condition happens when small pouches in your colon get infected or swollen. Take medicines only as told by your doctor. Follow a diet  as told by your doctor. Keep all follow-up visits as told by your doctor. This is important. This information is not intended to replace advice given to you by your health care provider. Make sure you discuss any questions you have with your health care provider. Document Revised: 12/29/2018 Document Reviewed: 12/29/2018 Elsevier Patient Education  2022  [employer].

## 2021-02-15 NOTE — Progress Notes (Signed)
I connected with Kevin [name] on 02/15/21 at  1:00 PM EST by video enabled telemedicine visit and verified that I am speaking with the correct person using two identifiers.   I discussed the limitations, risks, security and privacy concerns of performing an evaluation and management service by telemedicine and the availability of in-person appointments. I also discussed with the patient that there may be a patient responsible charge related to this service. The patient expressed understanding and agreed to proceed.  Other persons participating in the visit and their role in the encounter:  none  Patient's location:  home Provider's location:  work  Risk analyst Complaint: Routine follow-up of anemia  History of present illness:  Patient is a 65 year old male with a past medical history significant for hypertension hyperlipidemia obstructive sleep apnea psoriatic arthritis stage II CKD coronary artery disease among other medical problems.  He was seen by Dr. Bonna Gains recently for symptoms of heartburn.  He has been referred for anemia.  Most recent CBC showed white cell count of 5.6, H&H of 12.6/36.6 with an MCV of 94 and a platelet count of 184.  Ferritin levels normal at 40 and iron saturation of 28%.  Calcium normal Serum creatinine 1.1.  LFTs normal.   Patient reports feeling well overall.  Appetite and weight have remained normal.  Denies any unintentional weight loss.  Denies any blood loss in the stool or urine.  Last colonoscopy was in March 2019 which did not show any evidence of bleeding or malignancy.  Last upper endoscopy was in December 2021 which showed a mild Schatzki's ring at the GE junction.  Normal esophagus stomach and duodenal bulb.  Interval history patient reports doing well overall.  Denies any specific complaints at this time   Review of Systems  Constitutional:  Negative for chills, fever, malaise/fatigue and weight loss.  HENT:  Negative for congestion, ear discharge and  nosebleeds.   Eyes:  Negative for blurred vision.  Respiratory:  Negative for cough, hemoptysis, sputum production, shortness of breath and wheezing.   Cardiovascular:  Negative for chest pain, palpitations, orthopnea and claudication.  Gastrointestinal:  Negative for abdominal pain, blood in stool, constipation, diarrhea, heartburn, melena, nausea and vomiting.  Genitourinary:  Negative for dysuria, flank pain, frequency, hematuria and urgency.  Musculoskeletal:  Negative for back pain, joint pain and myalgias.  Skin:  Negative for rash.  Neurological:  Negative for dizziness, tingling, focal weakness, seizures, weakness and headaches.  Endo/Heme/Allergies:  Does not bruise/bleed easily.  Psychiatric/Behavioral:  Negative for depression and suicidal ideas. The patient does not have insomnia.    Allergies  Allergen Reactions   Strawberry Extract Anaphylaxis        Dilaudid [Hydromorphone Hcl] Nausea And Vomiting    Past Medical History:  Diagnosis Date   Brachial plexopathy    CKD (chronic kidney disease), stage II - III ([facility])    Coronary artery disease    a. s/p 4 vessel CABG in 05/2008 w/ LIMA-LAD, SVG-Diag, sequential SVG-OM1/OM2; b. s/p PCI/DES x 2 to LAD in 10/2009; c. LHC 03/2013 stable disease and patent LAD stents; d. 10/2016 Cath: LM min irregs, LAD 20p, 11m ISR, D2 80ost, LCX small, min irregs, OM1/2/3 min irregs, RCA/RPDA/RPAV min irregs, RPL1/2 nl RPL3 min irregs, VG->D2 nl, VG->OM1->OM2 100, EF 55-65%.   H/O maze procedure 05/17/2008   a. 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  Hypertension    Memory deficit after cerebral infarction    OSA (obstructive sleep apnea)    Overweight(278.02)    PAF (paroxysmal atrial fibrillation) ([place])    a. s/p Maze 05/2008, previously on Eliquis->discontinued 05/2016; c. CHADS2VASc => 4 (HTN, stroke x 2, vascular disease)   Patent foramen ovale    a. 05/2008 s/p  closure @ time of CABG.   Psoriatic arthritis ([place])    Skin cancer    Sleep apnea    Stroke [facility])    a. 07/2005 right brain; b. 05/2005 left brain; c. Resultant memory deficits.   Thrombocytopenia [facility])     Past Surgical History:  Procedure Laterality Date   ACUTE PANCREATITIS  5/11   APPENDECTOMY  1984   CARDIAC CATHETERIZATION     CHOLECYSTECTOMY  08/19/2009   COLONOSCOPY WITH PROPOFOL N/A 06/07/2017   Procedure: COLONOSCOPY WITH PROPOFOL;  Surgeon: Virgel Manifold, MD;  Location: [facility] ENDOSCOPY;  Service: Endoscopy;  Laterality: N/A;   CORONARY ARTERY BYPASS GRAFT  05/12/2008   x4 by PeterVan Trigt,MD   ESOPHAGOGASTRODUODENOSCOPY N/A 03/03/2020   Procedure: ESOPHAGOGASTRODUODENOSCOPY (EGD);  Surgeon: Virgel Manifold, MD;  Location: [facility] ENDOSCOPY;  Service: Endoscopy;  Laterality: N/A;   LEFT HEART CATH AND CORONARY ANGIOGRAPHY N/A 11/29/2016   Procedure: LEFT HEART CATH AND CORONARY ANGIOGRAPHY;  Surgeon: Wellington Hampshire, MD;  Location: [place] CV LAB;  Service: Cardiovascular;  Laterality: N/A;   PATENT FORAMEN OVALE CLOSURE     TONSILLECTOMY     AS A CHILD    Social History   Socioeconomic History   Marital status: Married    Spouse name: [name]   Number of children: 3   Years of education: Not on file   Highest education level: Not on file  Occupational History   Occupation: Full time    Employer: [employer]  Tobacco Use   Smoking status: Never   Smokeless tobacco: Never  Vaping Use   Vaping Use: Never used  Substance and Sexual Activity   Alcohol use: Yes    Comment: occassionally   Drug use: No   Sexual activity: Yes    Birth control/protection: None  Other Topics Concern   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 Went to high school. He has worked at his present job for 22 years. He has been married for 34 years. They  have 3 children. 2 of his daughters live in [place]. He drinks less than two cups of caffeine per day. He does not use  tobacco or recreational drugs. He has rare alcohol intake. Lives with wife and daughter      OSA diagnosed at [facility] , had his  sleep studies there  reviewed them all in detail today he  has retrognathia, sinusitis,  rhinitis      His ESS remains very high  at 16 and FSS at 21 . His falls assessment tool score is 5.   nasonex, refitted mask, the recent  HST failed to document that  apnea is present at all. education about REM BD and EDS, narcolepsy , cataplexy.  45 minutes.    Social Determinants of Health   Financial Resource Strain: Not on file  Food Insecurity: Not on file  Transportation Needs: Not on file  Physical Activity: Not on file  Stress: Not on file  Social Connections: Not on file  Intimate Partner  Violence: Not on file    Family History  Problem Relation Age of Onset   Hypertension Mother    Heart disease Mother    Psychiatric Illness Father    Heart attack Father    Diabetes Sister    Ovarian cancer Sister    Lung cancer Paternal Aunt    Cancer Paternal Uncle    Heart disease Paternal Grandmother    Heart disease Paternal Grandfather    Diabetes Other    Coronary artery disease Other    Diabetes Other    Hypertension Other    Hyperlipidemia Other    Sleep apnea Neg Hx      Current Outpatient Medications:    albuterol (VENTOLIN HFA) 108 (90 Base) MCG/ACT inhaler, Inhale 2 puffs into the lungs every 6 (six) hours as needed for wheezing or shortness of breath., Disp: 8 g, Rfl: 0   ALPRAZolam (XANAX) 0.5 MG tablet, TAKE 1 TO 2 TABLETS BY MOUTH EVERY EVENING AT BEDTIME, Disp: 60 tablet, Rfl: 1   ARTHRITIS PAIN RELIEF 650 MG CR tablet, Take 650 mg by mouth every 8 (eight) hours as needed., Disp: , Rfl:    aspirin EC 81 MG tablet, Take 81 mg by mouth daily., Disp: , Rfl:    cetirizine (ZYRTEC) 10 MG tablet, Take 10 mg by mouth at  bedtime., Disp: , Rfl:    clopidogrel (PLAVIX) 75 MG tablet, TAKE 1 TABLET BY MOUTH ONCE DAILY, Disp: 90 tablet, Rfl: 3   desonide (DESOWEN) 0.05 % cream, Apply 1 application topically 2 (two) times daily as needed (for psoriasis)., Disp: 30 g, Rfl: 2   ezetimibe (ZETIA) 10 MG tablet, TAKE 1 TABLET BY MOUTH ONCE A DAY, Disp: 90 tablet, Rfl: 3   isosorbide mononitrate (IMDUR) 30 MG 24 hr tablet, TAKE 1 TABLET BY MOUTH ONCE DAILY, Disp: 90 tablet, Rfl: 1   LEXAPRO 20 MG tablet, TAKE 1 TABLET BY MOUTH DAILY., Disp: 90 tablet, Rfl: 0   metoprolol tartrate (LOPRESSOR) 25 MG tablet, TAKE 1 TABLET BY MOUTH 2 TIMES DAILY, Disp: 180 tablet, Rfl: 3   pantoprazole (PROTONIX) 20 MG tablet, Take 1 tablet by mouth once a day, Disp: 90 tablet, Rfl: 3   RANEXA 1000 MG SR tablet, TAKE 1 TABLET BY MOUTH 2 TIMES DAILY, Disp: 180 tablet, Rfl: 0   rosuvastatin (CRESTOR) 40 MG tablet, TAKE 1 TABLET BY MOUTH ONCE A DAY, Disp: 90 tablet, Rfl: 3   amoxicillin (AMOXIL) 500 MG capsule, TAKE 4 CAPSULES BY MOUTH 1 HOUR PRIOR TO DENTAL APPT, Disp: 16 capsule, Rfl: 0   nitroGLYCERIN (NITROSTAT) 0.4 MG SL tablet, PLACE 1 TABLET (0.4 MG TOTAL) UNDER THE TONGUE EVERY 5 (FIVE) MINUTES AS NEEDED. MAY REPEAT FOR UP TO 3 DOSES. IF NO RELIEF WITH 1ST DOSE, CALL 911., Disp: 25 tablet, Rfl: 1   tacrolimus (PROTOPIC) 0.1 % ointment, Apply twice daily to affected areas as needed, Disp: 30 g, Rfl: 2  No results found.  No images are attached to the encounter.   CMP Latest Ref Rng & Units 12/08/2020  Glucose 65 - 99 mg/dL 108(H)  BUN 8 - 27 mg/dL 20  Creatinine 0.76 - 1.27 mg/dL 1.15  Sodium 134 - 144 mmol/L 142  Potassium 3.5 - 5.2 mmol/L 4.6  Chloride 96 - 106 mmol/L 105  CO2 20 - 29 mmol/L 23  Calcium 8.6 - 10.2 mg/dL 9.2  Total Protein 6.0 - 8.5 g/dL 6.6  Total Bilirubin 0.0 - 1.2 mg/dL 0.5  Alkaline Phos 44 -  121 IU/L 48  AST 0 - 40 IU/L 20  ALT 0 - 44 IU/L 14   CBC Latest Ref Rng & Units 01/24/2021  WBC 4.0 - 10.5 K/uL  7.0  Hemoglobin 13.0 - 17.0 g/dL 12.9(L)  Hematocrit 39.0 - 52.0 % 38.0(L)  Platelets 150 - 400 K/uL 179     Observation/objective: Appears in no acute distress over video visit today.  Breathing is nonlabored  Assessment and plan: Patient is a 65 year old male referred for normocytic anemia and this is a visit to discuss results of blood work and further management  Discussed the results of blood work from 01/24/2021 which shows an H&H of 12.9/38 indicating a mild normocytic anemia.  Haptoglobin, folate, reticulocyte count, LDH normal.  B12 levels mildly low at 218.  I have asked the patient to take oral B12 1000 mcg daily.  His iron studies from September 2022 were not indicative of iron deficiency.  He does not require any IV iron at this time.  Repeat CBC ferritin and iron studies and B12 levels in about 3 months and I will see him thereafter  Follow-up instructions: As above  I discussed the assessment and treatment plan with the patient. The patient was provided an opportunity to ask questions and all were answered. The patient agreed with the plan and demonstrated an understanding of the instructions.   The patient was advised to call back or seek an in-person evaluation if the symptoms worsen or if the condition fails to improve as anticipated.   Visit Diagnosis: 1. B12 deficiency   2. Normocytic anemia     Dr. Randa Evens, MD, MPH [facility] Tel- [phone] 02/15/2021 3:58 PM

## 2021-02-17 ENCOUNTER — Encounter: Payer: Self-pay | Admitting: Surgery

## 2021-02-17 NOTE — Progress Notes (Signed)
Patient ID: Kevin [name], male   DOB: 15-Nov-1955, 65 y.o.   MRN: [id number]  HPI Kevin [name] is a 65 y.o. male seen in consultation at the request of Dr. Bonna [name] for recurrent diverticulitis.  Please note that the patient has had recurrent episodes since 2015 that was the first 1.  Over the last few months he has had 2.  He has been seen by Dr. Bonna [name] who has made the referral.  Currently patient is pain-free and just finished an antibiotic course.  He denies any fevers any chills.  He does state that when he gets a diverticulitis attack he feels a lower abdominal pain and some change in bowel habits.  He has tried changing his diet and does not seem to have an impact on his disease. Of note he had diverticulitis of both distal descending colon and sigmoid colon there seem to be 2 separate locations.  The last time he had a CT scan of the abdomen pelvis was June 2022.  Please note that I have personally reviewed it there is evidence of some colonic diverticulitis of the descending colon no evidence of abscess or perforation. He had a complicated episode requiring drain placement. He does have a prior history of his stroke and had some memory issues.  Currently he still works and he is very active.  He comes accompanied by his wife.  He is able to perform more than 4 METS of activity without any shortness of breath or chest pain.  He does have a history of coronary artery disease and heart require a CABG in 2010.  He is taking aspirin and Plavix after he had a stent placed. He had a colonoscopy 3 years ago showing evidence of diverticulosis but no evidence of major other issues.  He was recommended to have a repeat colonoscopy in 5 years.  His last CBC and CMP were completely normal.  He does have some chronic kidney insufficiency with a baseline creatinine of around 1.2.  He has had a prior appendectomy and cholecystectomy in the remote past HPI  Past Medical History:  Diagnosis Date   Brachial  plexopathy    CKD (chronic kidney disease), stage II - III (Kevin [name])    Coronary artery disease    a. s/p 4 vessel CABG in 05/2008 w/ LIMA-LAD, SVG-Diag, sequential SVG-OM1/OM2; b. s/p PCI/DES x 2 to LAD in 10/2009; c. LHC 03/2013 stable disease and patent LAD stents; d. 10/2016 Cath: LM min irregs, LAD 20p, 55m ISR, D2 80ost, LCX small, min irregs, OM1/2/3 min irregs, RCA/RPDA/RPAV min irregs, RPL1/2 nl RPL3 min irregs, VG->D2 nl, VG->OM1->OM2 100, EF 55-65%.   H/O maze procedure 05/17/2008   a. 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    Hypertension    Memory deficit after cerebral infarction    OSA (obstructive sleep apnea)    Overweight(278.02)    PAF (paroxysmal atrial fibrillation) (Kevin [name])    a. s/p Maze 05/2008, previously on Eliquis->discontinued 05/2016; c. CHADS2VASc => 4 (HTN, stroke x 2, vascular disease)   Patent foramen ovale    a. 05/2008 s/p closure @ time of CABG.   Psoriatic arthritis (Kevin [name])    Skin cancer    Sleep apnea    Stroke Kevin [name])    a. 07/2005 right brain; b. 05/2005 left brain; c. Resultant memory deficits.   Thrombocytopenia Kevin [name])     Past Surgical  History:  Procedure Laterality Date   ACUTE PANCREATITIS  5/11   APPENDECTOMY  1984   CARDIAC CATHETERIZATION     CHOLECYSTECTOMY  08/19/2009   COLONOSCOPY WITH PROPOFOL N/A 06/07/2017   Procedure: COLONOSCOPY WITH PROPOFOL;  Surgeon: Kevin Manifold, MD;  Location: [name];  Service: [name];  Laterality: N/A;   CORONARY ARTERY BYPASS GRAFT  05/12/2008   x4 by Kevin Trigt,MD   ESOPHAGOGASTRODUODENOSCOPY N/A 03/03/2020   Procedure: ESOPHAGOGASTRODUODENOSCOPY (EGD);  Surgeon: Kevin Manifold, MD;  Location: [name];  Service: [name];  Laterality: N/A;   LEFT HEART CATH AND CORONARY ANGIOGRAPHY N/A 11/29/2016   Procedure: LEFT HEART CATH AND CORONARY ANGIOGRAPHY;  Surgeon: Kevin Hampshire, MD;  Location: [name];   Service: Cardiovascular;  Laterality: N/A;   PATENT FORAMEN OVALE CLOSURE     TONSILLECTOMY     AS A CHILD    Family History  Problem Relation Age of Onset   Hypertension Mother    Heart disease Mother    Psychiatric Illness Father    Heart attack Father    Diabetes Sister    Ovarian cancer Sister    Lung cancer Paternal Aunt    Cancer Paternal Uncle    Heart disease Paternal Grandmother    Heart disease Paternal Grandfather    Diabetes Other    Coronary artery disease Other    Diabetes Other    Hypertension Other    Hyperlipidemia Other    Sleep apnea Neg Hx     Social History Social History   Tobacco Use   Smoking status: Never   Smokeless tobacco: Never  Vaping Use   Vaping Use: Never used  Substance Use Topics   Alcohol use: Yes    Comment: occassionally   Drug use: No    Allergies  Allergen Reactions   Strawberry Extract Anaphylaxis        Dilaudid [Hydromorphone Hcl] Nausea And Vomiting    Current Outpatient Medications  Medication Sig Dispense Refill   albuterol (VENTOLIN HFA) 108 (90 Base) MCG/ACT inhaler Inhale 2 puffs into the lungs every 6 (six) hours as needed for wheezing or shortness of breath. 8 g 0   ALPRAZolam (XANAX) 0.5 MG tablet TAKE 1 TO 2 TABLETS BY MOUTH EVERY EVENING AT BEDTIME 60 tablet 1   amoxicillin (AMOXIL) 500 MG capsule TAKE 4 CAPSULES BY MOUTH 1 HOUR PRIOR TO DENTAL APPT 16 capsule 0   ARTHRITIS PAIN RELIEF 650 MG CR tablet Take 650 mg by mouth every 8 (eight) hours as needed.     aspirin EC 81 MG tablet Take 81 mg by mouth daily.     cetirizine (ZYRTEC) 10 MG tablet Take 10 mg by mouth at bedtime.     clopidogrel (PLAVIX) 75 MG tablet TAKE 1 TABLET BY MOUTH ONCE DAILY 90 tablet 3   desonide (DESOWEN) 0.05 % cream Apply 1 application topically 2 (two) times daily as needed (for psoriasis). 30 g 2   ezetimibe (ZETIA) 10 MG tablet TAKE 1 TABLET BY MOUTH ONCE A DAY 90 tablet 3   isosorbide mononitrate (IMDUR) 30 MG 24 hr tablet  TAKE 1 TABLET BY MOUTH ONCE DAILY 90 tablet 1   LEXAPRO 20 MG tablet TAKE 1 TABLET BY MOUTH DAILY. 90 tablet 0   metoprolol tartrate (LOPRESSOR) 25 MG tablet TAKE 1 TABLET BY MOUTH 2 TIMES DAILY 180 tablet 3   nitroGLYCERIN (NITROSTAT) 0.4 MG SL tablet PLACE 1 TABLET (0.4 MG TOTAL) UNDER THE TONGUE EVERY 5 (  FIVE) MINUTES AS NEEDED. MAY REPEAT FOR UP TO 3 DOSES. IF NO RELIEF WITH 1ST DOSE, CALL 911. 25 tablet 1   pantoprazole (PROTONIX) 20 MG tablet Take 1 tablet by mouth once a day 90 tablet 3   RANEXA 1000 MG SR tablet TAKE 1 TABLET BY MOUTH 2 TIMES DAILY 180 tablet 0   rosuvastatin (CRESTOR) 40 MG tablet TAKE 1 TABLET BY MOUTH ONCE A DAY 90 tablet 3   tacrolimus (PROTOPIC) 0.1 % ointment Apply twice daily to affected areas as needed 30 g 2   No current facility-administered medications for this visit.     Review of Systems Full ROS  was asked and was negative except for the information on the HPI  Physical Exam Blood pressure 123/74, pulse 66, temperature 97.9 F (36.6 C), temperature source Oral, height 5\' 6"  (1.676 m), weight 197 lb 3.2 oz (89.4 kg), SpO2 99 %. CONSTITUTIONAL: NAD. EYES: Pupils are equal, round, Sclera are non-icteric. EARS, NOSE, MOUTH AND THROAT: He is wearing a mask, Hearing is intact to voice. LYMPH NODES:  Lymph nodes in the neck are normal. RESPIRATORY:  Lungs are clear. There is normal respiratory effort, with equal breath sounds bilaterally, and without pathologic use of accessory muscles. CARDIOVASCULAR: Heart is regular without murmurs, gallops, or rubs. GI: The abdomen is  soft, nontender, and nondistended. There are no palpable masses. There is no hepatosplenomegaly. There are normal bowel sounds in all quadrants. GU: Rectal deferred.   MUSCULOSKELETAL: Normal muscle strength and tone. No cyanosis or edema.   SKIN: Turgor is good and there are no pathologic skin lesions or ulcers. NEUROLOGIC: Motor and sensation is grossly normal. Cranial nerves are  grossly intact. PSYCH:  Oriented to person, place and time. Affect is normal.  Data Reviewed  I have personally reviewed the patient's imaging, laboratory findings and medical records.    Assessment/Plan 65 year old male with at least 6 episode of diverticulitis.  He does have a history of stroke, CAD and is on dual antiplatelet therapy to include aspirin and Plavix.  I had an extensive discussion with the patient and his wife about the natural history of diverticulitis.  I did explain to him that current recommendations have changed and there is no specific number of recurrent diverticulitis that will mandate elective sigmoid colectomy.  I also stated that it was an individual decision.  I had an extensive discussion with them regarding what entails to have elective sigmoid colectomy.  The risks, the benefits and the possible complications to include infection, bleeding, anastomotic leak, increasing bowel frequency and potential cardiovascular and neurologic complications related to the procedure.  For now they wish to think about this and there not ready to make any major decisions at this time.  He is leaning towards continuation of medical therapy with antibiotics.  He also understands that this carries its own risk.  He wishes to f/u  w me in a few months.  Please note that I spent at least 70 minutes in this encounter including personally reviewing imaging studies, counseling the patient, coordinating his care and performing appropriate documentation.  Caroleen Hamman, MD FACS General Surgeon 02/17/2021, 3:54 PM

## 2021-03-21 ENCOUNTER — Other Ambulatory Visit (HOSPITAL_COMMUNITY): Payer: Self-pay

## 2021-03-21 ENCOUNTER — Other Ambulatory Visit: Payer: Self-pay | Admitting: Cardiovascular Disease

## 2021-03-21 ENCOUNTER — Other Ambulatory Visit: Payer: Self-pay | Admitting: Family Medicine

## 2021-03-21 DIAGNOSIS — F419 Anxiety disorder, unspecified: Secondary | ICD-10-CM

## 2021-03-21 MED FILL — Nitroglycerin SL Tab 0.4 MG: SUBLINGUAL | 7 days supply | Qty: 25 | Fill #0 | Status: AC

## 2021-03-22 ENCOUNTER — Other Ambulatory Visit (HOSPITAL_COMMUNITY): Payer: Self-pay

## 2021-03-22 MED ORDER — METOPROLOL TARTRATE 25 MG PO TABS
25.0000 mg | ORAL_TABLET | Freq: Two times a day (BID) | ORAL | 0 refills | Status: DC
  Filled 2021-03-22: qty 60, 30d supply, fill #0

## 2021-03-22 MED ORDER — EZETIMIBE 10 MG PO TABS
10.0000 mg | ORAL_TABLET | Freq: Every day | ORAL | 0 refills | Status: DC
  Filled 2021-03-22: qty 30, 30d supply, fill #0

## 2021-03-22 MED ORDER — LEXAPRO 20 MG PO TABS
20.0000 mg | ORAL_TABLET | Freq: Every day | ORAL | 0 refills | Status: DC
  Filled 2021-03-22 – 2021-03-23 (×2): qty 90, 90d supply, fill #0

## 2021-03-22 MED ORDER — RANEXA 1000 MG PO TB12
1000.0000 mg | ORAL_TABLET | Freq: Two times a day (BID) | ORAL | 0 refills | Status: DC
  Filled 2021-03-22: qty 60, 30d supply, fill #0

## 2021-03-22 MED ORDER — ALPRAZOLAM 0.5 MG PO TABS
0.5000 mg | ORAL_TABLET | Freq: Every evening | ORAL | 0 refills | Status: DC | PRN
  Filled 2021-03-22: qty 15, 15d supply, fill #0

## 2021-03-22 MED ORDER — ROSUVASTATIN CALCIUM 40 MG PO TABS
40.0000 mg | ORAL_TABLET | Freq: Every day | ORAL | 0 refills | Status: DC
  Filled 2021-03-22: qty 30, 30d supply, fill #0

## 2021-03-22 NOTE — Telephone Encounter (Signed)
He was here recently but anxiety was not discussed last note I see on anxiety was that he was managing well w/ lexapro alone, need to discuss ok to do virtual visit

## 2021-03-22 NOTE — Telephone Encounter (Signed)
Patient scheduled for a mychart video visit 04/04/21

## 2021-03-22 NOTE — Telephone Encounter (Signed)
Requested Prescriptions  Pending Prescriptions Disp Refills   LEXAPRO 20 MG tablet 90 tablet 0    Sig: TAKE 1 TABLET BY MOUTH DAILY.     Psychiatry:  Antidepressants - SSRI Passed - 03/21/2021  4:57 PM      Passed - Valid encounter within last 6 months    Recent Outpatient Visits          1 month ago [place] Thedore Mins, [place], PA-C   4 months ago Annual physical exam   [employer], Vickki Muff, PA-C   1 year ago [place], Vickki Muff, PA-C   1 year ago Annual physical exam   [employer], Vickki Muff, PA-C   1 year ago Dysfunction of left eustachian tube   [place], Vickki Muff, PA-C      Future Appointments            In 2 months Sindy Guadeloupe, MD [facility] [place]            ALPRAZolam (XANAX) 0.5 MG tablet 60 tablet 1    Sig: TAKE 1 TO 2 TABLETS BY MOUTH EVERY EVENING AT BEDTIME     Not Delegated - Psychiatry:  Anxiolytics/Hypnotics Failed - 03/21/2021  4:57 PM      Failed - This refill cannot be delegated      Failed - Urine Drug Screen completed in last 360 days      Passed - Valid encounter within last 6 months    Recent Outpatient Visits          1 month ago [place] Thedore Mins, [place], PA-C   4 months ago Annual physical exam   [employer], Vickki Muff, PA-C   1 year ago [place], Vickki Muff, PA-C   1 year ago Annual physical exam   [place], PA-C   1 year ago Dysfunction of left eustachian tube   [place], Vickki Muff, PA-C      Future Appointments            In 2 months Sindy Guadeloupe, MD [facility] [place]

## 2021-03-22 NOTE — Telephone Encounter (Signed)
Requested medications are due for refill today.  yes  Requested medications are on the active medications list.  yes  Last refill. 11/16/2020  Future visit scheduled.   no  Notes to clinic.  Medication not delegated.    Requested Prescriptions  Pending Prescriptions Disp Refills   ALPRAZolam (XANAX) 0.5 MG tablet 60 tablet 1    Sig: TAKE 1 TO 2 TABLETS BY MOUTH EVERY EVENING AT BEDTIME     Not Delegated - Psychiatry:  Anxiolytics/Hypnotics Failed - 03/21/2021  4:57 PM      Failed - This refill cannot be delegated      Failed - Urine Drug Screen completed in last 360 days      Passed - Valid encounter within last 6 months    Recent Outpatient Visits           1 month ago [place] Thedore Mins, [place], PA-C   4 months ago Annual physical exam   [employer], Vickki Muff, PA-C   1 year ago [place], Vickki Muff, PA-C   1 year ago Annual physical exam   [employer], Vickki Muff, PA-C   1 year ago Dysfunction of left eustachian tube   [place], PA-C       Future Appointments             In 2 months Sindy Guadeloupe, MD [facility] [place]            Signed Prescriptions Disp Refills   LEXAPRO 20 MG tablet 90 tablet 0    Sig: TAKE 1 TABLET BY MOUTH DAILY.     Psychiatry:  Antidepressants - SSRI Passed - 03/21/2021  4:57 PM      Passed - Valid encounter within last 6 months    Recent Outpatient Visits           1 month ago [place] Thedore Mins, [place], PA-C   4 months ago Annual physical exam   [employer], Vickki Muff, PA-C   1 year ago [place], Vickki Muff, PA-C   1 year ago Annual physical exam   [place], PA-C   1 year ago Dysfunction of left eustachian tube    [place], Vickki Muff, PA-C       Future Appointments             In 2 months Sindy Guadeloupe, MD [facility] at [place]

## 2021-03-23 ENCOUNTER — Other Ambulatory Visit (HOSPITAL_COMMUNITY): Payer: Self-pay

## 2021-04-04 ENCOUNTER — Other Ambulatory Visit (HOSPITAL_COMMUNITY): Payer: Self-pay

## 2021-04-04 ENCOUNTER — Encounter: Payer: Self-pay | Admitting: Physician Assistant

## 2021-04-04 ENCOUNTER — Telehealth (INDEPENDENT_AMBULATORY_CARE_PROVIDER_SITE_OTHER): Payer: 59 | Admitting: Physician Assistant

## 2021-04-04 DIAGNOSIS — F419 Anxiety disorder, unspecified: Secondary | ICD-10-CM | POA: Diagnosis not present

## 2021-04-04 MED ORDER — LEXAPRO 20 MG PO TABS
20.0000 mg | ORAL_TABLET | Freq: Every day | ORAL | 3 refills | Status: DC
  Filled 2021-04-04 – 2021-06-15 (×2): qty 90, 90d supply, fill #0
  Filled 2021-09-16: qty 90, 90d supply, fill #1
  Filled 2021-12-16: qty 90, 90d supply, fill #2
  Filled 2022-03-20: qty 90, 90d supply, fill #3

## 2021-04-04 NOTE — Progress Notes (Signed)
MyChart Video Visit    Virtual Visit via Video Note   This visit type was conducted due to national recommendations for restrictions regarding the COVID-19 Pandemic (e.g. social distancing) in an effort to limit this patient's exposure and mitigate transmission in our community. This patient is at least at moderate risk for complications without adequate follow up. This format is felt to be most appropriate for this patient at this time. Physical exam was limited by quality of the video and audio technology used for the visit.   Patient location: home  Provider location: [facility]  I discussed the limitations of evaluation and management by telemedicine and the availability of in person appointments. The patient expressed understanding and agreed to proceed.  Patient: Kevin [name]   DOB: Aug 20, 1955   66 y.o. Male  MRN: [id number] Visit Date: 04/04/2021  Today's healthcare provider: Mikey Kirschner, PA-C   Cc. Anxiety f/u  Subjective    HPI  Anxiety, Follow-up  He was last seen for anxiety 1 years ago.(03/15/20) Changes made at last visit include continue Lexapro 20 mg qd.   He reports excellent compliance with treatment. He reports good tolerance of treatment. He is not having side effects.   He feels his anxiety is mild and controlled with medication and without it not so much and  stable  since last visit.  Symptoms: No chest pain No difficulty concentrating  No dizziness No fatigue  No feelings of losing control No insomnia  No irritable No palpitations  No panic attacks No racing thoughts  No shortness of breath No sweating  No tremors/shakes    GAD-7 Results GAD-7 Generalized Anxiety Disorder Screening Tool 04/04/2021  1. Feeling Nervous, Anxious, or on Edge 0  2. Not Being Able to Stop or Control Worrying 0  3. Worrying Too Much About Different Things 3  4. Trouble Relaxing 0  5. Being So Restless it's Hard To Sit Still 0  6. Becoming Easily  Annoyed or Irritable 0  7. Feeling Afraid As If Something Awful Might Happen 0  Total GAD-7 Score 3  Difficulty At Work, Home, or Getting  Along With Others? Not difficult at all    PHQ-9 Scores PHQ9 SCORE ONLY 01/31/2021 10/24/2020 03/15/2020  PHQ-9 Total Score 0 0 0    ---------------------------------------------------------------------------------------------------    Medications: Outpatient Medications Prior to Visit  Medication Sig   albuterol (VENTOLIN HFA) 108 (90 Base) MCG/ACT inhaler Inhale 2 puffs into the lungs every 6 (six) hours as needed for wheezing or shortness of breath.   ALPRAZolam (XANAX) 0.5 MG tablet Take 1 tablet (0.5 mg total) by mouth at bedtime as needed for anxiety.   ARTHRITIS PAIN RELIEF 650 MG CR tablet Take 650 mg by mouth every 8 (eight) hours as needed.   aspirin EC 81 MG tablet Take 81 mg by mouth daily.   cetirizine (ZYRTEC) 10 MG tablet Take 10 mg by mouth at bedtime.   clopidogrel (PLAVIX) 75 MG tablet TAKE 1 TABLET BY MOUTH ONCE DAILY   desonide (DESOWEN) 0.05 % cream Apply 1 application topically 2 (two) times daily as needed (for psoriasis).   ezetimibe (ZETIA) 10 MG tablet Take 1 tablet (10 mg total) by mouth daily. PLEASE CALL OFFICE FOR APPOINTMENT FOR FURTHER REFILLS.   isosorbide mononitrate (IMDUR) 30 MG 24 hr tablet TAKE 1 TABLET BY MOUTH ONCE DAILY   metoprolol tartrate (LOPRESSOR) 25 MG tablet Take 1 tablet (25 mg total) by mouth 2 (two) times daily. PLEASE  CALL OFFICE FOR APPOINTMENT FOR FURTHER REFILLS.   nitroGLYCERIN (NITROSTAT) 0.4 MG SL tablet PLACE 1 TABLET UNDER THE TONGUE EVERY 5 MINUTES AS NEEDED. MAY REPEAT FOR UP TO 3 DOSES. IF NO RELIEF WITH 1ST DOSE, CALL 911.   pantoprazole (PROTONIX) 20 MG tablet Take 1 tablet by mouth once a day   RANEXA 1000 MG SR tablet Take 1 tablet (1,000 mg total) by mouth 2 (two) times daily. PLEASE CALL OFFICE FOR APPOINTMENT FOR FURTHER REFILLS.   rosuvastatin (CRESTOR) 40 MG tablet Take 1 tablet  (40 mg total) by mouth daily. PLEASE CALL OFFICE FOR APPOINTMENT FOR FURTHER REFILLS.   tacrolimus (PROTOPIC) 0.1 % ointment Apply twice daily to affected areas as needed   [DISCONTINUED] LEXAPRO 20 MG tablet TAKE 1 TABLET BY MOUTH DAILY.   No facility-administered medications prior to visit.    Review of Systems  Constitutional:  Negative for fatigue.  Cardiovascular:  Negative for chest pain.  Gastrointestinal:  Negative for abdominal pain.  Neurological:  Negative for dizziness and headaches.  Psychiatric/Behavioral:  Negative for sleep disturbance and suicidal ideas. The patient is not nervous/anxious.      Objective    There were no vitals taken for this visit.   Physical Exam Constitutional:      General: He is not in acute distress.    Appearance: He is not ill-appearing.  Pulmonary:     Effort: No respiratory distress.  Neurological:     Mental Status: He is alert and oriented to person, place, and time.  Psychiatric:        Mood and Affect: Mood normal.        Behavior: Behavior normal.       Assessment & Plan     GAD Chronic and stable Pt has been on two different generic escitalopram agents with significant side effects, requires brand name Lexapro for appropriate management    Return in about 6 months (around 10/02/2021) for anxiety, as scheduled.     I discussed the assessment and treatment plan with the patient. The patient was provided an opportunity to ask questions and all were answered. The patient agreed with the plan and demonstrated an understanding of the instructions.   The patient was advised to call back or seek an in-person evaluation if the symptoms worsen or if the condition fails to improve as anticipated.  I provided 15 minutes of non-face-to-face time during this encounter.  I, Mikey Kirschner, PA-C have reviewed all documentation for this visit. The documentation on  04/04/2021  for the exam, diagnosis, procedures, and orders are all  accurate and complete.   Mikey Kirschner, PA-C [facility] [phone] (phone) [phone] (fax)  [place]

## 2021-04-17 DIAGNOSIS — G4733 Obstructive sleep apnea (adult) (pediatric): Secondary | ICD-10-CM | POA: Diagnosis not present

## 2021-04-23 ENCOUNTER — Other Ambulatory Visit: Payer: Self-pay | Admitting: Physician Assistant

## 2021-04-23 DIAGNOSIS — F419 Anxiety disorder, unspecified: Secondary | ICD-10-CM

## 2021-04-23 NOTE — Telephone Encounter (Signed)
Requested medication (s) are due for refill today: yes  Requested medication (s) are on the active medication list: yes  Last refill:  03/22/21 #15 tab  Future visit scheduled: no  Notes to clinic:  med not delegated to NT to RF   Requested Prescriptions  Pending Prescriptions Disp Refills   ALPRAZolam (XANAX) 0.5 MG tablet 15 tablet 0    Sig: Take 1 tablet (0.5 mg total) by mouth at bedtime as needed for anxiety.     Not Delegated - Psychiatry:  Anxiolytics/Hypnotics Failed - 04/23/2021  6:30 PM      Failed - This refill cannot be delegated      Failed - Urine Drug Screen completed in last 360 days      Passed - Valid encounter within last 6 months    Recent Outpatient Visits           2 weeks ago [place] Mikey Kirschner, PA-C   2 months ago [place] Thedore Mins, [place], PA-C   6 months ago Annual physical exam   [employer], Vickki Muff, PA-C   1 year ago [place], PA-C   1 year ago Annual physical exam   [place], PA-C       Future Appointments             In 1 week [place], Kathlene November, MD [facility] [place]   In 1 month Sindy Guadeloupe, MD [facility] [place]

## 2021-04-24 ENCOUNTER — Other Ambulatory Visit (HOSPITAL_COMMUNITY): Payer: Self-pay

## 2021-04-24 MED ORDER — ALPRAZOLAM 0.5 MG PO TABS
0.5000 mg | ORAL_TABLET | Freq: Every evening | ORAL | 0 refills | Status: DC | PRN
  Filled 2021-04-24: qty 15, 15d supply, fill #0

## 2021-05-01 ENCOUNTER — Other Ambulatory Visit: Payer: Self-pay

## 2021-05-01 ENCOUNTER — Encounter: Payer: Self-pay | Admitting: Physician Assistant

## 2021-05-01 DIAGNOSIS — F419 Anxiety disorder, unspecified: Secondary | ICD-10-CM

## 2021-05-01 NOTE — Progress Notes (Signed)
Cardiology Office Note  Date:  05/02/2021   ID:  Kevin [name], Kevin [name] 1956/02/28, MRN [id number]  PCP:  Mikey Kirschner, PA-C   Chief Complaint  Patient presents with   12 month follow up     Patient c/o shortness of breath and a tightness in throat at times while walking to the mailbox & syncope about 1 month ago while putting air in his tires. Medications reviewed by the patient verbally.     HPI:  Kevin [name] is a 66 y/o male with h/o  CVA, memory problems CAD,  atrial fib,  HTN,  hyperlipidemia,  OSA and previous CVA,   CABG with Maze and PFO closure in February 2010,   cath in 10/2008  for exertional dyspnea and CP showing 2-V CAD.  LAD 95% mid, LCX small RCA large OK.  SVG -> OM1 and OM2 was occluded. SVG-Diag was widely patent and backfilled the LAD well. LIMA - LAD was atretic. Myoview to assess LAD for ischemia with very mild reversible defect in very distal anterior wall and apex. Decision made to manage him medically, continued chest pain and cath Aug 9th 2011 with PCI of the LAD with 2 stents placed.  Cath 11/29/16 for chest pain, medical management He presents today for follow-up of his coronary artery disease  In general reports doing well but has had some issues to discuss Recently had a gas station in the afternoon Was filling all of his tires, standing up sitting down numerous times Reports that after feeling 3 tires, got lightheaded,/orthostatic,/near syncope, and passed out Reports that he woke up stood up and then passed out again Someone in the parking lot had to help him to his feet Has not had a any other further episodes since that time  Blood pressure running low on today's visit, afternoon visit, unclear if he has had enough to drink  Also reports pulling intrash can, getting throat tightness Happening more he thinks In the past this was his typical angina sx Otherwise has been active at work No regular exercise program Typically does not take nitro  EKG  personally reviewed by myself on todays visit Normal sinus rhythm rate 62 bpm no significant ST-T wave changes  Past medical history reviewed covid in sept 2020   chronic low-grade memory issues  On Crestor Zetia  Other past medical history reviewed Prior stress test December 13, 2017 Exercise myocardial perfusion imaging study with no significant ischemia Low risk scan    cardiac catheterization performed by Dr. Fletcher Anon August 2018 for stuttering chest pain Cath reviewed Significant underlying one-vessel coronary artery disease with patent SVG to second diagonal, patent LAD stent with mild in-stent restenosis and known occluded SVG to OM. Native RCA and left circumflex don't have obstructive disease.  Normal LV systolic function and mildly elevated left ventricular end-diastolic pressure.   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  PSH:    Past Surgical History:  Procedure Laterality Date   ACUTE PANCREATITIS  5/11   APPENDECTOMY  1984   CARDIAC CATHETERIZATION     CHOLECYSTECTOMY  08/19/2009   COLONOSCOPY WITH PROPOFOL N/A 06/07/2017   Procedure: COLONOSCOPY WITH PROPOFOL;  Surgeon: Bonna Gains,  Lennette Bihari, MD;  Location: [facility] ENDOSCOPY;  Service: Endoscopy;  Laterality: N/A;   CORONARY ARTERY BYPASS GRAFT  05/12/2008   x4 by PeterVan Trigt,MD   ESOPHAGOGASTRODUODENOSCOPY N/A 03/03/2020   Procedure: ESOPHAGOGASTRODUODENOSCOPY (EGD);  Surgeon: Virgel Manifold, MD;  Location: [facility] ENDOSCOPY;  Service: Endoscopy;  Laterality: N/A;   LEFT HEART  CATH AND CORONARY ANGIOGRAPHY N/A 11/29/2016   Procedure: LEFT HEART CATH AND CORONARY ANGIOGRAPHY;  Surgeon: Wellington Hampshire, MD;  Location: [facility];  Service: Cardiovascular;  Laterality: N/A;   PATENT FORAMEN OVALE CLOSURE     TONSILLECTOMY     AS A CHILD    Current Outpatient Medications  Medication Sig Dispense Refill   albuterol (VENTOLIN HFA) 108 (90 Base) MCG/ACT inhaler Inhale 2 puffs into the lungs every 6 (six) hours as needed for wheezing or shortness of breath. 8 g 0   ARTHRITIS PAIN RELIEF 650 MG CR tablet Take 650 mg by mouth every 8 (eight) hours as needed.     aspirin EC 81 MG tablet Take 81 mg by mouth daily.     cetirizine (ZYRTEC) 10 MG tablet Take 10 mg by mouth at bedtime.     clopidogrel (PLAVIX) 75 MG tablet TAKE 1 TABLET BY MOUTH ONCE DAILY 90 tablet 3   desonide (DESOWEN) 0.05 % cream Apply 1 application topically 2 (two) times daily as needed (for psoriasis). 30 g 2   ezetimibe (ZETIA) 10 MG tablet Take 1 tablet (10 mg total) by mouth daily. PLEASE CALL OFFICE FOR APPOINTMENT FOR FURTHER REFILLS. 30 tablet 0   isosorbide mononitrate (IMDUR) 30 MG 24 hr tablet TAKE 1 TABLET BY MOUTH ONCE DAILY 90 tablet 1   LEXAPRO 20 MG tablet TAKE 1 TABLET BY MOUTH DAILY. 90 tablet 3   metoprolol tartrate (LOPRESSOR) 25 MG tablet Take 1 tablet (25 mg total) by mouth 2 (two) times daily. PLEASE CALL OFFICE FOR APPOINTMENT FOR FURTHER REFILLS. 60 tablet 0   pantoprazole (PROTONIX) 20 MG tablet Take 1 tablet by mouth once a day 90 tablet 3   RANEXA 1000 MG SR tablet Take 1 tablet (1,000 mg total) by mouth 2 (two) times daily. PLEASE CALL OFFICE FOR APPOINTMENT FOR FURTHER REFILLS. 60 tablet 0   rosuvastatin (CRESTOR) 40 MG tablet Take 1 tablet (40 mg total) by mouth daily. PLEASE CALL OFFICE FOR APPOINTMENT FOR FURTHER REFILLS. 30 tablet 0   tacrolimus (PROTOPIC) 0.1 % ointment Apply twice daily to affected areas as needed 30 g 2   ALPRAZolam (XANAX) 0.5 MG tablet Take  1-2 tablets (0.5-1 mg total) by mouth at bedtime as needed for anxiety or sleep. 60 tablet 0   nitroGLYCERIN (NITROSTAT) 0.4 MG SL tablet PLACE 1 TABLET UNDER THE TONGUE EVERY 5 MINUTES AS NEEDED. MAY REPEAT FOR UP TO 3 DOSES. IF NO RELIEF WITH 1ST DOSE, CALL 911. 25 tablet 1   No current facility-administered medications for this visit.     Allergies:   Strawberry extract and Dilaudid [hydromorphone hcl]   Social History:  The patient  reports that he has never smoked. He has never used smokeless tobacco. He reports current alcohol use. He reports that he does not use drugs.   Family History:   family history includes Cancer in his paternal uncle; Coronary artery disease in an other family member; Diabetes in his sister and other family members; Heart attack in his father; Heart disease in his mother, paternal grandfather, and paternal grandmother; Hyperlipidemia in an other family member; Hypertension  in his mother and another family member; Lung cancer in his paternal aunt; Ovarian cancer in his sister; Psychiatric Illness in his father.    Review of Systems: Review of Systems  Constitutional: Negative.   HENT: Negative.    Respiratory: Negative.    Cardiovascular: Negative.   Gastrointestinal: Negative.   Musculoskeletal: Negative.   Neurological: Negative.   Psychiatric/Behavioral: Negative.    All other systems reviewed and are negative.  PHYSICAL EXAM: VS:  BP 110/70 (BP Location: Left Arm, Patient Position: Sitting, Cuff Size: Normal)    Pulse 62    Ht 5\' 6"  (1.676 m)    Wt 200 lb 8 oz (90.9 kg)    SpO2 98%    BMI 32.36 kg/m  , BMI Body mass index is 32.36 kg/m. Constitutional:  oriented to person, place, and time. No distress.  HENT:  Head: Grossly normal Eyes:  no discharge. No scleral icterus.  Neck: No JVD, no carotid bruits  Cardiovascular: Regular rate and rhythm, no murmurs appreciated Pulmonary/Chest: Clear to auscultation bilaterally, no wheezes or  rails Abdominal: Soft.  no distension.  no tenderness.  Musculoskeletal: Normal range of motion Neurological:  normal muscle tone. Coordination normal. No atrophy Skin: Skin warm and dry Psychiatric: normal affect, pleasant  Recent Labs: 12/08/2020: ALT 14; BUN 20; Creatinine, Ser 1.15; Potassium 4.6; Sodium 142; TSH 2.210 01/24/2021: Hemoglobin 12.9; Platelets 179    Lipid Panel Lab Results  Component Value Date   CHOL 108 12/08/2020   HDL 54 12/08/2020   LDLCALC 41 12/08/2020   TRIG 54 12/08/2020      Wt Readings from Last 3 Encounters:  05/02/21 200 lb 8 oz (90.9 kg)  02/15/21 197 lb 3.2 oz (89.4 kg)  02/06/21 193 lb 11.2 oz (87.9 kg)     ASSESSMENT AND PLAN:  Coronary artery disease of native artery of native heart with stable angina pectoris (HCC) -  Recent throat tightness, concerning for angina We have offered stress Myoview, he has declined at this time Strongly recommended if symptoms persist, get worse, or if he starts taking nitroglycerin that he call our office for testing  Essential hypertension - Plan: EKG 12-Lead Blood pressure low, recommended he stay hydrated  Paroxysmal atrial fibrillation (HCC) Previous maze procedure ,  Maintaining normal sinus rhythm  Hyperlipidemia Cholesterol is at goal on the current lipid regimen. No changes to the medications were made.  PATENT FORAMEN OVALE  previous PFO closure   Preventive care Cholesterol at goal, nondiabetic, no smoking   Total encounter time more than 30 minutes  Greater than 50% was spent in counseling and coordination of care with the patient    No orders of the defined types were placed in this encounter.    Signed, Esmond Plants, [name], Ph.D. 05/02/2021  [place], [place]

## 2021-05-02 ENCOUNTER — Other Ambulatory Visit: Payer: Self-pay | Admitting: Physician Assistant

## 2021-05-02 ENCOUNTER — Ambulatory Visit (INDEPENDENT_AMBULATORY_CARE_PROVIDER_SITE_OTHER): Payer: 59 | Admitting: Cardiovascular Disease

## 2021-05-02 ENCOUNTER — Encounter: Payer: Self-pay | Admitting: Cardiovascular Disease

## 2021-05-02 ENCOUNTER — Other Ambulatory Visit: Payer: Self-pay

## 2021-05-02 ENCOUNTER — Other Ambulatory Visit (HOSPITAL_COMMUNITY): Payer: Self-pay

## 2021-05-02 VITALS — BP 110/70 | HR 62 | Ht 66.0 in | Wt 200.5 lb

## 2021-05-02 DIAGNOSIS — E782 Mixed hyperlipidemia: Secondary | ICD-10-CM

## 2021-05-02 DIAGNOSIS — E785 Hyperlipidemia, unspecified: Secondary | ICD-10-CM

## 2021-05-02 DIAGNOSIS — I639 Cerebral infarction, unspecified: Secondary | ICD-10-CM | POA: Diagnosis not present

## 2021-05-02 DIAGNOSIS — I480 Paroxysmal atrial fibrillation: Secondary | ICD-10-CM | POA: Diagnosis not present

## 2021-05-02 DIAGNOSIS — F419 Anxiety disorder, unspecified: Secondary | ICD-10-CM

## 2021-05-02 DIAGNOSIS — I25110 Atherosclerotic heart disease of native coronary artery with unstable angina pectoris: Secondary | ICD-10-CM | POA: Diagnosis not present

## 2021-05-02 DIAGNOSIS — I10 Essential (primary) hypertension: Secondary | ICD-10-CM | POA: Diagnosis not present

## 2021-05-02 MED ORDER — ISOSORBIDE MONONITRATE ER 30 MG PO TB24
ORAL_TABLET | Freq: Every day | ORAL | 3 refills | Status: DC
  Filled 2021-05-02: qty 90, 90d supply, fill #0
  Filled 2021-08-06: qty 90, 90d supply, fill #1
  Filled 2021-10-22: qty 90, 90d supply, fill #2
  Filled 2022-01-11 – 2022-01-20 (×2): qty 90, 90d supply, fill #3

## 2021-05-02 MED ORDER — RANEXA 1000 MG PO TB12
1000.0000 mg | ORAL_TABLET | Freq: Two times a day (BID) | ORAL | 3 refills | Status: DC
  Filled 2021-05-02: qty 180, 90d supply, fill #0
  Filled 2021-05-02 – 2021-05-11 (×2): qty 60, 30d supply, fill #0
  Filled 2021-06-17: qty 60, 30d supply, fill #1
  Filled 2021-07-17: qty 60, 30d supply, fill #2

## 2021-05-02 MED ORDER — ROSUVASTATIN CALCIUM 40 MG PO TABS
40.0000 mg | ORAL_TABLET | Freq: Every day | ORAL | 3 refills | Status: DC
  Filled 2021-05-02: qty 90, 90d supply, fill #0
  Filled 2021-08-06: qty 90, 90d supply, fill #1
  Filled 2021-11-11: qty 90, 90d supply, fill #2
  Filled 2022-01-28: qty 90, 90d supply, fill #3

## 2021-05-02 MED ORDER — METOPROLOL TARTRATE 25 MG PO TABS
25.0000 mg | ORAL_TABLET | Freq: Two times a day (BID) | ORAL | 3 refills | Status: DC
  Filled 2021-05-02: qty 180, 90d supply, fill #0
  Filled 2021-08-28: qty 180, 90d supply, fill #1
  Filled 2021-11-11: qty 180, 90d supply, fill #2
  Filled 2022-02-16: qty 180, 90d supply, fill #3

## 2021-05-02 MED ORDER — EZETIMIBE 10 MG PO TABS
10.0000 mg | ORAL_TABLET | Freq: Every day | ORAL | 3 refills | Status: DC
  Filled 2021-05-02: qty 90, 90d supply, fill #0
  Filled 2021-08-06: qty 90, 90d supply, fill #1
  Filled 2021-10-22: qty 90, 90d supply, fill #2
  Filled 2022-01-28: qty 90, 90d supply, fill #3

## 2021-05-02 MED ORDER — ALPRAZOLAM 0.5 MG PO TABS
0.5000 mg | ORAL_TABLET | Freq: Every evening | ORAL | 0 refills | Status: DC | PRN
  Filled 2021-05-02 – 2021-05-07 (×2): qty 60, 30d supply, fill #0

## 2021-05-02 NOTE — Patient Instructions (Signed)
Medication Instructions:  No changes  If you need a refill on your cardiac medications before your next appointment, please call your pharmacy.   Lab work: No new labs needed  Testing/Procedures: No new testing needed  Follow-Up: At [facility], you and your health needs are our priority.  As part of our continuing mission to provide you with exceptional heart care, we have created designated Provider Care Teams.  These Care Teams include your primary Cardiologist (physician) and Advanced Practice Providers (APPs -  Physician Assistants and Nurse Practitioners) who all work together to provide you with the care you need, when you need it.  You will need a follow up appointment in 12 months  Providers on your designated Care Team:   Christopher Berge, NP Ryan Dunn, PA-C Cadence Furth, PA-C  COVID-19 Vaccine Information can be found at: [email or web address] For questions related to vaccine distribution or appointments, please email [email or web address] or call [phone].   

## 2021-05-03 ENCOUNTER — Other Ambulatory Visit (HOSPITAL_COMMUNITY): Payer: Self-pay

## 2021-05-08 ENCOUNTER — Other Ambulatory Visit (HOSPITAL_COMMUNITY): Payer: Self-pay

## 2021-05-08 ENCOUNTER — Telehealth: Payer: Self-pay

## 2021-05-08 NOTE — Telephone Encounter (Signed)
Prior Authorization initiated by [email or web address] for Ranexa 1000 mg tablets KEY: [id number] Dr. Donivan Scull most recent office note from 05-02-2021 has been attached.  RESPONSE: [employer] is reviewing your PA request. You may close this dialog, return to your dashboard, and perform other tasks.  To check for an update later, open this request again from your dashboard. If [employer] has not replied within 24 hours for urgent requests or within 48 hours for standard requests, please contact [employer] at [phone].

## 2021-05-09 ENCOUNTER — Other Ambulatory Visit (HOSPITAL_COMMUNITY): Payer: Self-pay

## 2021-05-11 ENCOUNTER — Encounter: Payer: Self-pay | Admitting: Oncology

## 2021-05-11 ENCOUNTER — Encounter: Payer: Self-pay | Admitting: Cardiovascular Disease

## 2021-05-11 ENCOUNTER — Other Ambulatory Visit (HOSPITAL_COMMUNITY): Payer: Self-pay

## 2021-05-11 NOTE — Telephone Encounter (Signed)
The request has been approved. The authorization is effective for a maximum of 12 fills from 05/10/2021 to 05/09/2022, as long as the member is enrolled in their current health plan. The request was reviewed and approved by a licensed clinical pharmacist. A written notification letter will follow with additional details. Patient informed.

## 2021-05-12 ENCOUNTER — Other Ambulatory Visit (HOSPITAL_COMMUNITY): Payer: Self-pay

## 2021-05-19 ENCOUNTER — Ambulatory Visit: Payer: 59

## 2021-05-24 ENCOUNTER — Ambulatory Visit: Payer: 59 | Admitting: Surgery

## 2021-05-25 ENCOUNTER — Other Ambulatory Visit: Payer: 59

## 2021-05-29 ENCOUNTER — Telehealth: Payer: 59 | Admitting: Oncology

## 2021-06-05 ENCOUNTER — Ambulatory Visit: Payer: 59 | Admitting: Surgery

## 2021-06-09 ENCOUNTER — Ambulatory Visit: Payer: 59 | Attending: Internal Medicine

## 2021-06-09 DIAGNOSIS — Z23 Encounter for immunization: Secondary | ICD-10-CM

## 2021-06-09 NOTE — Progress Notes (Signed)
? ?  Covid-19 Vaccination Clinic ? ?Name:  Kevin [name]    ?MRN: [id number] ?DOB: October 04, 1955 ? ?06/09/2021 ? ?[name] was observed post Covid-19 immunization for 30 minutes based on pre-vaccination screening without incident. He was provided with Vaccine Information Sheet and instruction to access the V-Safe system.  ? ?[name] was instructed to call 911 with any severe reactions post vaccine: ?Difficulty breathing  ?Swelling of face and throat  ?A fast heartbeat  ?A bad rash all over body  ?Dizziness and weakness  ? ?Immunizations Administered   ? ? Name Date Dose VIS Date Route  ? Ambulance person Booster 06/09/2021  3:09 PM 0.3 mL 11/30/2020 Intramuscular  ? Manufacturer: [place]: [phone]  ? [place]: [id number]  ? ?  ? ?

## 2021-06-12 ENCOUNTER — Other Ambulatory Visit: Payer: Self-pay

## 2021-06-12 MED ORDER — PFIZER COVID-19 VAC BIVALENT 30 MCG/0.3ML IM SUSP
INTRAMUSCULAR | 0 refills | Status: DC
  Filled 2021-06-12: qty 0.3, 1d supply, fill #0

## 2021-06-16 ENCOUNTER — Other Ambulatory Visit (HOSPITAL_COMMUNITY): Payer: Self-pay

## 2021-06-19 ENCOUNTER — Other Ambulatory Visit (HOSPITAL_COMMUNITY): Payer: Self-pay

## 2021-06-20 ENCOUNTER — Other Ambulatory Visit (HOSPITAL_COMMUNITY): Payer: Self-pay

## 2021-06-25 ENCOUNTER — Other Ambulatory Visit: Payer: Self-pay | Admitting: Physician Assistant

## 2021-06-25 DIAGNOSIS — F419 Anxiety disorder, unspecified: Secondary | ICD-10-CM

## 2021-06-26 ENCOUNTER — Other Ambulatory Visit (HOSPITAL_COMMUNITY): Payer: Self-pay

## 2021-06-26 MED ORDER — ALPRAZOLAM 0.5 MG PO TABS
0.5000 mg | ORAL_TABLET | Freq: Every evening | ORAL | 0 refills | Status: DC | PRN
Start: 2021-06-26 — End: 2021-08-06
  Filled 2021-06-26: qty 60, 30d supply, fill #0

## 2021-07-10 DIAGNOSIS — G4733 Obstructive sleep apnea (adult) (pediatric): Secondary | ICD-10-CM | POA: Diagnosis not present

## 2021-07-11 DIAGNOSIS — X32XXXA Exposure to sunlight, initial encounter: Secondary | ICD-10-CM | POA: Diagnosis not present

## 2021-07-11 DIAGNOSIS — Z85828 Personal history of other malignant neoplasm of skin: Secondary | ICD-10-CM | POA: Diagnosis not present

## 2021-07-11 DIAGNOSIS — D2261 Melanocytic nevi of right upper limb, including shoulder: Secondary | ICD-10-CM | POA: Diagnosis not present

## 2021-07-11 DIAGNOSIS — L570 Actinic keratosis: Secondary | ICD-10-CM | POA: Diagnosis not present

## 2021-07-11 DIAGNOSIS — D2271 Melanocytic nevi of right lower limb, including hip: Secondary | ICD-10-CM | POA: Diagnosis not present

## 2021-07-11 DIAGNOSIS — D2262 Melanocytic nevi of left upper limb, including shoulder: Secondary | ICD-10-CM | POA: Diagnosis not present

## 2021-07-17 ENCOUNTER — Other Ambulatory Visit (HOSPITAL_COMMUNITY): Payer: Self-pay

## 2021-07-19 ENCOUNTER — Other Ambulatory Visit: Payer: Self-pay | Admitting: Cardiovascular Disease

## 2021-07-19 ENCOUNTER — Other Ambulatory Visit (HOSPITAL_COMMUNITY): Payer: Self-pay

## 2021-07-19 ENCOUNTER — Encounter: Payer: Self-pay | Admitting: Cardiovascular Disease

## 2021-07-19 ENCOUNTER — Other Ambulatory Visit: Payer: Self-pay

## 2021-07-19 MED ORDER — RANOLAZINE ER 1000 MG PO TB12
1000.0000 mg | ORAL_TABLET | Freq: Two times a day (BID) | ORAL | 3 refills | Status: DC
  Filled 2021-07-19: qty 180, 90d supply, fill #0
  Filled 2021-10-22: qty 180, 90d supply, fill #1
  Filled 2021-12-16 – 2022-01-11 (×2): qty 180, 90d supply, fill #2
  Filled 2022-04-14: qty 180, 90d supply, fill #3

## 2021-07-19 NOTE — Telephone Encounter (Signed)
Message from pharmacy -- "Brand name Ranexa has been discontinued. Please change medication." ?

## 2021-07-20 ENCOUNTER — Other Ambulatory Visit (HOSPITAL_COMMUNITY): Payer: Self-pay

## 2021-08-06 ENCOUNTER — Other Ambulatory Visit: Payer: Self-pay | Admitting: Physician Assistant

## 2021-08-06 DIAGNOSIS — F419 Anxiety disorder, unspecified: Secondary | ICD-10-CM

## 2021-08-07 ENCOUNTER — Other Ambulatory Visit (HOSPITAL_COMMUNITY): Payer: Self-pay

## 2021-08-07 MED ORDER — ALPRAZOLAM 0.5 MG PO TABS
0.5000 mg | ORAL_TABLET | Freq: Every evening | ORAL | 0 refills | Status: DC | PRN
  Filled 2021-08-07: qty 60, 30d supply, fill #0

## 2021-08-29 ENCOUNTER — Other Ambulatory Visit (HOSPITAL_COMMUNITY): Payer: Self-pay

## 2021-09-16 ENCOUNTER — Other Ambulatory Visit: Payer: Self-pay | Admitting: Cardiovascular Disease

## 2021-09-16 ENCOUNTER — Other Ambulatory Visit (HOSPITAL_COMMUNITY): Payer: Self-pay

## 2021-09-18 ENCOUNTER — Other Ambulatory Visit (HOSPITAL_COMMUNITY): Payer: Self-pay

## 2021-09-18 MED ORDER — CLOPIDOGREL BISULFATE 75 MG PO TABS
ORAL_TABLET | Freq: Every day | ORAL | 2 refills | Status: DC
  Filled 2021-09-18: qty 90, 90d supply, fill #0
  Filled 2021-12-16: qty 90, 90d supply, fill #1
  Filled 2022-03-20: qty 90, 90d supply, fill #2

## 2021-09-18 MED ORDER — NITROGLYCERIN 0.4 MG SL SUBL
SUBLINGUAL_TABLET | SUBLINGUAL | 0 refills | Status: DC
  Filled 2021-09-18: qty 25, 8d supply, fill #0

## 2021-09-19 ENCOUNTER — Other Ambulatory Visit (HOSPITAL_COMMUNITY): Payer: Self-pay

## 2021-09-20 ENCOUNTER — Other Ambulatory Visit (HOSPITAL_COMMUNITY): Payer: Self-pay

## 2021-09-20 ENCOUNTER — Other Ambulatory Visit: Payer: Self-pay | Admitting: Cardiovascular Disease

## 2021-09-20 ENCOUNTER — Other Ambulatory Visit: Payer: Self-pay | Admitting: Physician Assistant

## 2021-09-20 DIAGNOSIS — F419 Anxiety disorder, unspecified: Secondary | ICD-10-CM

## 2021-09-20 MED ORDER — NITROGLYCERIN 0.4 MG SL SUBL
SUBLINGUAL_TABLET | SUBLINGUAL | 0 refills | Status: AC
  Filled 2021-09-20: qty 25, fill #0
  Filled 2022-03-20: qty 25, 8d supply, fill #0

## 2021-09-20 MED ORDER — ALPRAZOLAM 0.5 MG PO TABS
0.5000 mg | ORAL_TABLET | Freq: Every evening | ORAL | 3 refills | Status: DC | PRN
  Filled 2021-09-20: qty 60, 30d supply, fill #0
  Filled 2021-12-16: qty 60, 30d supply, fill #1
  Filled 2022-02-16: qty 60, 30d supply, fill #2

## 2021-10-02 DIAGNOSIS — G4733 Obstructive sleep apnea (adult) (pediatric): Secondary | ICD-10-CM | POA: Diagnosis not present

## 2021-10-16 ENCOUNTER — Encounter: Payer: Self-pay | Admitting: Cardiovascular Disease

## 2021-10-16 DIAGNOSIS — I25110 Atherosclerotic heart disease of native coronary artery with unstable angina pectoris: Secondary | ICD-10-CM

## 2021-10-19 ENCOUNTER — Telehealth: Payer: Self-pay | Admitting: Physician Assistant

## 2021-10-19 NOTE — Telephone Encounter (Signed)
Spoke with patient's wife.   Patient only has Medicare Part A- not eligible for AWV

## 2021-10-19 NOTE — Telephone Encounter (Signed)
Copied from [place] [id number]. Topic: Medicare AWV >> Oct 19, 2021  2:25 PM Jae Dire wrote: Reason for CRM:  Left message for patient to call back and schedule Medicare Annual Wellness Visit (AWV) in office.   If unable to come into the office for AWV,  please offer to do virtually or by telephone. Offer to complete October 26, 2021 at 8:15 before appointment with provider if still available   Last AWV: : 10/24/2020  Please schedule at anytime with [facility] Health Advisor.  30 minute appointment for Virtual or phone 45 minute appointment for in office or Initial virtual/phone  Any questions, please contact me at [phone]

## 2021-10-23 ENCOUNTER — Other Ambulatory Visit (HOSPITAL_COMMUNITY): Payer: Self-pay

## 2021-10-24 ENCOUNTER — Other Ambulatory Visit (HOSPITAL_COMMUNITY): Payer: Self-pay

## 2021-10-24 ENCOUNTER — Ambulatory Visit: Payer: 59

## 2021-10-26 ENCOUNTER — Encounter: Payer: Self-pay | Admitting: Physician Assistant

## 2021-10-26 ENCOUNTER — Ambulatory Visit (INDEPENDENT_AMBULATORY_CARE_PROVIDER_SITE_OTHER): Payer: 59 | Admitting: Physician Assistant

## 2021-10-26 VITALS — BP 123/75 | HR 53 | Ht 66.0 in | Wt 205.6 lb

## 2021-10-26 DIAGNOSIS — F419 Anxiety disorder, unspecified: Secondary | ICD-10-CM

## 2021-10-26 DIAGNOSIS — I10 Essential (primary) hypertension: Secondary | ICD-10-CM

## 2021-10-26 DIAGNOSIS — Z0000 Encounter for general adult medical examination without abnormal findings: Secondary | ICD-10-CM | POA: Diagnosis not present

## 2021-10-26 DIAGNOSIS — Z23 Encounter for immunization: Secondary | ICD-10-CM | POA: Diagnosis not present

## 2021-10-26 DIAGNOSIS — E782 Mixed hyperlipidemia: Secondary | ICD-10-CM

## 2021-10-26 DIAGNOSIS — G4730 Sleep apnea, unspecified: Secondary | ICD-10-CM | POA: Diagnosis not present

## 2021-10-26 DIAGNOSIS — I25708 Atherosclerosis of coronary artery bypass graft(s), unspecified, with other forms of angina pectoris: Secondary | ICD-10-CM | POA: Diagnosis not present

## 2021-10-26 DIAGNOSIS — I480 Paroxysmal atrial fibrillation: Secondary | ICD-10-CM

## 2021-10-26 NOTE — Assessment & Plan Note (Addendum)
Managed by cardiology. Has upcoming stress test. Takes asa 81 mg and plavix 75 mg; crestor 40 mg

## 2021-10-26 NOTE — Progress Notes (Signed)
I,Kevin [name],acting as Kevin Education administrator for [employer], PA-C.,have documented all relevant documentation on Kevin behalf of Kevin Kirschner, PA-C,as directed by  Kevin Kirschner, PA-C while in Kevin presence of Kevin Kirschner, PA-C.   Complete physical exam   Patient: Kevin [name]   DOB: 05-Jun-1955   66 y.o. Male  MRN: [id number] Visit Date: 10/26/2021  Today'Kevin healthcare provider: Mikey Kirschner, PA-C   Cc. cpe  Subjective    Kevin [name] is Kevin 66 y.o. male who presents today for Kevin complete physical exam.  He reports consuming Kevin general diet. Kevin patient does not participate in regular exercise at present. He generally feels well. He reports sleeping fairly well. He does not have additional problems to discuss today.   Reports night sweats for Kevin last 2-3 weeks. Feels Kevin air in his CPAP is too warm. Denies needing to change pjs/sheets. Denies cough, sob.  Past Medical History:  Diagnosis Date   Allergy    Since child   Asthma Childhood   Brachial plexopathy    CKD (chronic kidney disease), stage II - III (Kevin [name])    Coronary artery disease    Kevin. Kevin/p 4 vessel CABG in 05/2008 w/ LIMA-LAD, SVG-Diag, sequential SVG-OM1/OM2; b. Kevin/p PCI/DES x 2 to LAD in 10/2009; c. LHC 03/2013 stable disease and patent LAD stents; d. 10/2016 Cath: LM min irregs, LAD 20p, 88mISR, D2 80ost, LCX small, min irregs, OM1/2/3 min irregs, RCA/RPDA/RPAV min irregs, RPL1/2 nl RPL3 min irregs, VG->D2 nl, VG->OM1->OM2 100, EF 55-65%.   Depression 1988   H/O maze procedure 05/17/2008   Kevin. 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    Hypertension    Memory deficit after cerebral infarction    OSA (obstructive sleep apnea)    Overweight(278.02)    PAF (paroxysmal atrial fibrillation) (Kevin [name]    Kevin. Kevin/p Maze 05/2008, previously on Eliquis->discontinued 05/2016; c. CHADS2VASc => 4 (HTN, stroke x 2, vascular disease)   Patent foramen ovale    Kevin. 05/2008  Kevin/p closure @ time of CABG.   Psoriatic arthritis ([facility]    Skin cancer    Sleep apnea    Stroke (Kevin [name]    Kevin. 07/2005 right brain; b. 05/2005 left brain; c. Resultant memory deficits.   Thrombocytopenia (Kevin [name]    Past Surgical History:  Procedure Laterality Date   ACUTE PANCREATITIS  5/11   APPENDECTOMY  1984   CARDIAC CATHETERIZATION     CHOLECYSTECTOMY  08/19/2009   COLONOSCOPY WITH PROPOFOL N/Kevin 06/07/2017   Procedure: COLONOSCOPY WITH PROPOFOL;  Surgeon: TVirgel Manifold MD;  Location: [name];  Service: [name];  Laterality: N/Kevin;   CORONARY ARTERY BYPASS GRAFT  05/12/2008   x4 by PeterVan Trigt,MD   ESOPHAGOGASTRODUODENOSCOPY N/Kevin 03/03/2020   Procedure: ESOPHAGOGASTRODUODENOSCOPY (EGD);  Surgeon: TVirgel Manifold MD;  [facility];  Service: [name];  Laterality: N/Kevin;   LEFT HEART CATH AND CORONARY ANGIOGRAPHY N/Kevin 11/29/2016   Procedure: LEFT HEART CATH AND CORONARY ANGIOGRAPHY;  Surgeon: AWellington Hampshire MD;  Location: [name];  Service: Cardiovascular;  Laterality: N/Kevin;   PATENT FORAMEN OVALE CLOSURE     TONSILLECTOMY     AS Kevin CHILD   Social History   Socioeconomic History   Marital status: Married    Spouse name: SWynona [name]  Number of children: 3   Years of education: Not on file  Highest education level: Not on file  Occupational History   Occupation: Full time    Employer: [employer]  Tobacco Use   Smoking status: Never   Smokeless tobacco: Never  Vaping Use   Vaping Use: Never used  Substance and Sexual Activity   Alcohol use: Yes    Comment: occassionally   Drug use: No   Sexual activity: Yes    Birth control/protection: None  Other Topics Concern   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 Went to high school. He has worked at his present job for 22 years. He has been married for 34 years. They  have 3 children. 2 of his daughters live in [place]. He drinks less than two cups of caffeine per day. He does not use  tobacco or recreational drugs. He has rare alcohol intake. Lives with wife and daughter      OSA diagnosed at [name] , had his  sleep studies there  reviewed them all in detail today he  has retrognathia, sinusitis,  rhinitis      His ESS remains very high  at 16 and FSS at 73 . His falls assessment tool score is 5.   nasonex, refitted mask, Kevin recent  HST failed to document that  apnea is present at all. education about REM BD and EDS, narcolepsy , cataplexy.  45 minutes.    Social Determinants of Health   Financial Resource Strain: Not on file  Food Insecurity: Not on file  Transportation Needs: Not on file  Physical Activity: Not on file  Stress: Not on file  Social Connections: Not on file  Intimate Partner Violence: Not on file   Family Status  Relation Name Status   Mother N Deceased at age 76       CARDIAC ARREST   Father  Deceased at age 71       CARDIAC ARREST   Sister N Deceased at age 40   Pat Aunt  Deceased   Pat Uncle N (Not Specified)   PGM N (Not Specified)   PGF N (Not Specified)   Other AUNT Alive   Other  (Not Specified)   Neg Hx  (Not Specified)   Family History  Problem Relation Age of Onset   Hypertension Mother    Heart disease Mother    Psychiatric Illness Father    Heart attack Father    Diabetes Sister    Ovarian cancer Sister    Lung cancer Paternal Aunt    Cancer Paternal Uncle    Heart disease Paternal Grandmother    Heart disease Paternal Grandfather    Diabetes Other    Coronary artery disease Other    Diabetes Other    Hypertension Other    Hyperlipidemia Other    Sleep apnea Neg Hx    Allergies  Allergen Reactions   Strawberry Extract Anaphylaxis        Dilaudid [Hydromorphone Hcl] Nausea And Vomiting    Patient Care Team: Kevin Kirschner, PA-C as PCP - General (Physician Assistant) Minna Merritts, MD as PCP - Cardiology (Cardiology) Minna Merritts, MD as Consulting Physician (Cardiology)   Medications: Outpatient Medications Prior to Visit  Medication Sig   albuterol (VENTOLIN HFA) 108 (90 Base) MCG/ACT inhaler Inhale 2 puffs into Kevin lungs every 6 (six) hours as needed for wheezing or shortness of breath.  ALPRAZolam (XANAX) 0.5 MG tablet Take 1 to 2 tablets by mouth at bedtime as needed for anxiety or sleep.   ARTHRITIS PAIN RELIEF 650 MG CR tablet Take 650 mg by mouth every 8 (eight) hours as needed.   aspirin EC 81 MG tablet Take 81 mg by mouth daily.   cetirizine (ZYRTEC) 10 MG tablet Take 10 mg by mouth at bedtime.   clopidogrel (PLAVIX) 75 MG tablet TAKE 1 TABLET BY MOUTH ONCE DAILY   COVID-19 mRNA bivalent vaccine, Pfizer, (PFIZER COVID-19 VAC BIVALENT) injection Inject into Kevin muscle.   desonide (DESOWEN) 0.05 % cream Apply 1 application topically 2 (two) times daily as needed (for psoriasis).   ezetimibe (ZETIA) 10 MG tablet Take 1 tablet by mouth daily.   isosorbide mononitrate (IMDUR) 30 MG 24 hr tablet TAKE 1 TABLET BY MOUTH ONCE DAILY   LEXAPRO 20 MG tablet TAKE 1 TABLET BY MOUTH DAILY.   metoprolol tartrate (LOPRESSOR) 25 MG tablet Take 1 tablet by mouth 2 times daily.   nitroGLYCERIN (NITROSTAT) 0.4 MG SL tablet PLACE 1 TABLET UNDER Kevin TONGUE EVERY 5 MINUTES AS NEEDED. MAY REPEAT FOR UP TO 3 DOSES. IF NO RELIEF WITH 1ST DOSE, CALL 911.   pantoprazole (PROTONIX) 20 MG tablet Take 1 tablet by mouth once Kevin day   ranolazine (RANEXA) 1000 MG SR tablet Take 1 tablet (1,000 mg total) by mouth 2 (two) times daily.   rosuvastatin (CRESTOR) 40 MG tablet Take 1 tablet by mouth daily.   tacrolimus (PROTOPIC) 0.1 % ointment Apply twice daily to affected areas as needed   No facility-administered medications prior to visit.    Review of Systems  Constitutional:  Negative for fatigue and fever.  HENT:  Positive for hearing loss and tinnitus.   Respiratory:   Positive for apnea. Negative for cough and shortness of breath.   Cardiovascular:  Negative for chest pain, palpitations and leg swelling.  Genitourinary:  Positive for frequency.  Musculoskeletal:  Positive for arthralgias.  Neurological:  Negative for dizziness and headaches.  Hematological:  Bruises/bleeds easily.    Objective     Blood pressure 123/75, pulse (!) 53, height '5\' 6"'$  (1.676 m), weight 205 lb 9.6 oz (93.3 kg), SpO2 100 %.    Physical Exam Constitutional:      General: He is awake.     Appearance: He is well-developed.  HENT:     Head: Normocephalic.     Right Ear: Tympanic membrane, ear canal and external ear normal.     Left Ear: Tympanic membrane, ear canal and external ear normal.     Nose: Nose normal. No congestion or rhinorrhea.     Mouth/Throat:     Mouth: Mucous membranes are moist.     Pharynx: No oropharyngeal exudate or posterior oropharyngeal erythema.  Eyes:     Pupils: Pupils are equal, round, and reactive to light.  Cardiovascular:     Rate and Rhythm: Normal rate and regular rhythm.     Heart sounds: Normal heart sounds.  Pulmonary:     Effort: Pulmonary effort is normal.     Breath sounds: Normal breath sounds.  Abdominal:     General: There is no distension.     Palpations: Abdomen is soft.     Tenderness: There is no abdominal tenderness. There is no guarding.  Musculoskeletal:     Cervical back: Normal range of motion.     Right lower leg: No edema.     Left lower leg: No edema.  Lymphadenopathy:     Cervical: No cervical adenopathy.  Skin:    General: Skin is warm.     Comments: Large ecchymosis b/l arms  Neurological:     Mental Status: He is alert and oriented to person, place, and time.  Psychiatric:        Attention and Perception: Attention normal.        Mood and Affect: Mood normal.        Speech: Speech normal.        Behavior: Behavior normal. Behavior is cooperative.      Last depression screening scores     01/31/2021    1:55 PM 10/24/2020    1:33 PM 03/15/2020    1:10 PM  PHQ 2/9 Scores  PHQ - 2 Score 0 0 0  PHQ- 9 Score 0 0 0   Last fall risk screening    10/22/2021   11:02 AM  Fall Risk   Falls in Kevin past year? 0   Last Audit-C alcohol use screening    10/22/2021   11:02 AM  Alcohol Use Disorder Test (AUDIT)  1. How often do you have Kevin drink containing alcohol? 0   Kevin score of 3 or more in women, and 4 or more in men indicates increased risk for alcohol abuse, EXCEPT if all of Kevin points are from question 1   No results found for any visits on 10/26/21.  Assessment & Plan    Routine Health Maintenance and Physical Exam  Exercise Activities and Dietary recommendations --balanced diet high in fiber and protein, low in sugars, carbs, fats. --physical activity/exercise 30 minutes 3-5 times Kevin week     Immunization History  Administered Date(Kevin) Administered   Fluad Quad(high Dose 65+) 01/24/2021   Influenza Split 01/13/2009   Influenza,inj,Quad PF,6+ Mos 03/30/2016, 12/31/2017   Influenza-Unspecified 01/24/2019, 02/03/2020   PFIZER Comirnaty(Gray Top)Covid-19 Tri-Sucrose Vaccine 11/18/2020   PFIZER(Purple Top)SARS-COV-2 Vaccination 02/15/2020   Pfizer Covid-19 Vaccine Bivalent Booster 11yr & up 06/09/2021   Pneumococcal Conjugate-13 09/14/2019   Pneumococcal Polysaccharide-23 10/26/2021   Tdap 08/27/2012    Health Maintenance  Topic Date Due   Zoster Vaccines- Shingrix (1 of 2) Never done   COVID-19 Vaccine (4 - Pfizer series) 10/09/2021   INFLUENZA VACCINE  10/31/2021   TETANUS/TDAP  08/28/2022   COLONOSCOPY (Pts 45-465yrInsurance coverage will need to be confirmed)  06/08/2027   Pneumonia Vaccine 6585Years old  Completed   Hepatitis C Screening  Completed   HPV VACCINES  Aged Out    Discussed health benefits of physical activity, and encouraged him to engage in regular exercise appropriate for his age and condition.  Problem List Items Addressed This Visit        Cardiovascular and Mediastinum   Atherosclerosis of coronary artery bypass graft of native heart with stable angina pectoris ([facility]   Managed by cardiology. Has upcoming stress test. Takes asa 81 mg and plavix 75 mg; crestor 40 mg      Relevant Orders   Lipid Profile   Paroxysmal atrial fibrillation (HCC)    Normal rhythm today, takes asa 81 and plavix 75 mg daily      Hypertension    Chronic and well controlled Continue current medications Ordered cmp      Relevant Orders   CBC w/Diff/Platelet     Respiratory   Sleep apnea with use of continuous positive airway pressure (CPAP)    maintained with consistent CPAP use Advised seeing if he can adjust  humidified temp on machine to help with sweats in Kevin summer; may need to call company      Relevant Orders   Comprehensive Metabolic Panel (CMET)     Other   Hyperlipidemia    Managed with crestor 40 mg will get fasting lipids      Relevant Orders   Lipid Profile   Anxiety    Stable on lexapro 20 mg and prn xanax.  continue      Other Visit Diagnoses     Annual physical exam    -  Primary   Need for vaccination against Streptococcus pneumoniae       Relevant Orders   Pneumococcal polysaccharide vaccine 23-valent greater than or equal to 2yo subcutaneous/IM (Completed)        Return in about 6 months (around 04/28/2022) for anxiety.     I, Kevin Kirschner, PA-C have reviewed all documentation for this visit. Kevin documentation on  10/26/2021 for Kevin exam, diagnosis, procedures, and orders are all accurate and complete.  Kevin Kirschner, PA-C [facility] [address] [place], [place], [address] Office: [phone] Fax: [place]

## 2021-10-26 NOTE — Assessment & Plan Note (Signed)
Normal rhythm today, takes asa 81 and plavix 75 mg daily

## 2021-10-26 NOTE — Assessment & Plan Note (Signed)
maintained with consistent CPAP use Advised seeing if he can adjust humidified temp on machine to help with sweats in the summer; may need to call company

## 2021-10-26 NOTE — Assessment & Plan Note (Signed)
Managed with crestor 40 mg will get fasting lipids

## 2021-10-26 NOTE — Assessment & Plan Note (Signed)
Chronic and well controlled Continue current medications Ordered cmp

## 2021-10-26 NOTE — Assessment & Plan Note (Signed)
Stable on lexapro 20 mg and prn xanax.  continue

## 2021-10-27 ENCOUNTER — Encounter
Admission: RE | Admit: 2021-10-27 | Discharge: 2021-10-27 | Disposition: A | Discharge status: Home / Self Care | Payer: 59 | Source: Ambulatory Visit | Attending: Cardiovascular Disease | Admitting: Cardiovascular Disease

## 2021-10-27 DIAGNOSIS — I25110 Atherosclerotic heart disease of native coronary artery with unstable angina pectoris: Secondary | ICD-10-CM | POA: Insufficient documentation

## 2021-10-27 MED ORDER — TECHNETIUM TC 99M TETROFOSMIN IV KIT
10.0000 | PACK | Freq: Once | INTRAVENOUS | Status: AC
  Administered 2021-10-27: 10.82 via INTRAVENOUS

## 2021-10-27 MED ORDER — TECHNETIUM TC 99M TETROFOSMIN IV KIT
31.9300 | PACK | Freq: Once | INTRAVENOUS | Status: AC | PRN
  Administered 2021-10-27: 31.93 via INTRAVENOUS

## 2021-10-27 MED ORDER — REGADENOSON 0.4 MG/5ML IV SOLN
0.4000 mg | Freq: Once | INTRAVENOUS | Status: AC
  Administered 2021-10-27: 0.4 mg via INTRAVENOUS

## 2021-10-28 LAB — NM MYOCAR MULTI W/SPECT W/WALL MOTION / EF
LV dias vol: 109 mL (ref 62–150)
LV sys vol: 33 mL
Nuc Stress EF: 70 %
Peak HR: 80 {beats}/min
Percent HR: 52 %
Rest HR: 59 {beats}/min
Rest Nuclear Isotope Dose: 10.8 mCi
SDS: 4
SRS: 0
SSS: 4
ST Depression (mm): 0 mm
Stress Nuclear Isotope Dose: 31.9 mCi
TID: 0.85

## 2021-11-11 ENCOUNTER — Other Ambulatory Visit (HOSPITAL_COMMUNITY): Payer: Self-pay

## 2021-11-17 DIAGNOSIS — E782 Mixed hyperlipidemia: Secondary | ICD-10-CM | POA: Diagnosis not present

## 2021-11-17 DIAGNOSIS — I10 Essential (primary) hypertension: Secondary | ICD-10-CM | POA: Diagnosis not present

## 2021-11-17 DIAGNOSIS — I25708 Atherosclerosis of coronary artery bypass graft(s), unspecified, with other forms of angina pectoris: Secondary | ICD-10-CM | POA: Diagnosis not present

## 2021-11-17 DIAGNOSIS — G4730 Sleep apnea, unspecified: Secondary | ICD-10-CM | POA: Diagnosis not present

## 2021-11-18 LAB — COMPREHENSIVE METABOLIC PANEL
ALT: 12 IU/L (ref 0–44)
AST: 16 IU/L (ref 0–40)
Albumin/Globulin Ratio: 2.1 (ref 1.2–2.2)
Albumin: 4.5 g/dL (ref 3.9–4.9)
Alkaline Phosphatase: 42 IU/L — ABNORMAL LOW (ref 44–121)
BUN/Creatinine Ratio: 14 (ref 10–24)
BUN: 19 mg/dL (ref 8–27)
Bilirubin Total: 0.6 mg/dL (ref 0.0–1.2)
CO2: 23 mmol/L (ref 20–29)
Calcium: 9.2 mg/dL (ref 8.6–10.2)
Chloride: 105 mmol/L (ref 96–106)
Creatinine, Ser: 1.36 mg/dL — ABNORMAL HIGH (ref 0.76–1.27)
Globulin, Total: 2.1 g/dL (ref 1.5–4.5)
Glucose: 108 mg/dL — ABNORMAL HIGH (ref 70–99)
Potassium: 4.4 mmol/L (ref 3.5–5.2)
Sodium: 141 mmol/L (ref 134–144)
Total Protein: 6.6 g/dL (ref 6.0–8.5)
eGFR: 57 mL/min/{1.73_m2} — ABNORMAL LOW (ref >59)

## 2021-11-18 LAB — CBC WITH DIFFERENTIAL/PLATELET
Basophils Absolute: 0.1 10*3/uL (ref 0.0–0.2)
Basos: 1 %
EOS (ABSOLUTE): 0.4 10*3/uL (ref 0.0–0.4)
Eos: 7 %
Hematocrit: 37 % — ABNORMAL LOW (ref 37.5–51.0)
Hemoglobin: 12.5 g/dL — ABNORMAL LOW (ref 13.0–17.7)
Immature Grans (Abs): 0 10*3/uL (ref 0.0–0.1)
Immature Granulocytes: 1 %
Lymphocytes Absolute: 1.1 10*3/uL (ref 0.7–3.1)
Lymphs: 20 %
MCH: 32.6 pg (ref 26.6–33.0)
MCHC: 33.8 g/dL (ref 31.5–35.7)
MCV: 96 fL (ref 79–97)
Monocytes Absolute: 0.6 10*3/uL (ref 0.1–0.9)
Monocytes: 10 %
Neutrophils Absolute: 3.4 10*3/uL (ref 1.4–7.0)
Neutrophils: 61 %
Platelets: 178 10*3/uL (ref 150–450)
RBC: 3.84 x10E6/uL — ABNORMAL LOW (ref 4.14–5.80)
RDW: 12.4 % (ref 11.6–15.4)
WBC: 5.5 10*3/uL (ref 3.4–10.8)

## 2021-11-18 LAB — LIPID PANEL
Chol/HDL Ratio: 2 ratio (ref 0.0–5.0)
Cholesterol, Total: 107 mg/dL (ref 100–199)
HDL: 54 mg/dL (ref >39)
LDL Chol Calc (NIH): 39 mg/dL (ref 0–99)
Triglycerides: 66 mg/dL (ref 0–149)
VLDL Cholesterol Cal: 14 mg/dL (ref 5–40)

## 2021-11-21 ENCOUNTER — Encounter: Payer: Self-pay | Admitting: Physician Assistant

## 2021-11-21 ENCOUNTER — Other Ambulatory Visit: Payer: Self-pay | Admitting: Physician Assistant

## 2021-11-21 DIAGNOSIS — D649 Anemia, unspecified: Secondary | ICD-10-CM

## 2021-11-21 DIAGNOSIS — R7989 Other specified abnormal findings of blood chemistry: Secondary | ICD-10-CM

## 2021-11-24 ENCOUNTER — Ambulatory Visit (INDEPENDENT_AMBULATORY_CARE_PROVIDER_SITE_OTHER): Payer: 59 | Admitting: Physician Assistant

## 2021-11-24 DIAGNOSIS — Z23 Encounter for immunization: Secondary | ICD-10-CM

## 2021-12-16 ENCOUNTER — Other Ambulatory Visit (HOSPITAL_COMMUNITY): Payer: Self-pay

## 2021-12-20 ENCOUNTER — Other Ambulatory Visit (HOSPITAL_COMMUNITY): Payer: Self-pay

## 2022-01-11 ENCOUNTER — Other Ambulatory Visit: Payer: Self-pay

## 2022-01-12 ENCOUNTER — Other Ambulatory Visit (HOSPITAL_COMMUNITY): Payer: Self-pay

## 2022-01-15 ENCOUNTER — Other Ambulatory Visit (HOSPITAL_COMMUNITY): Payer: Self-pay

## 2022-01-18 ENCOUNTER — Telehealth: Payer: Self-pay | Admitting: Gastroenterology

## 2022-01-18 NOTE — Telephone Encounter (Signed)
Patient's wife called wanting a refill on pantoprazole for the patient. Patient was seen by Dr T in the past. I scheduled him an appointment with [facility]. The wife is wanting the prescription called in as soon as possible.

## 2022-01-19 ENCOUNTER — Other Ambulatory Visit: Payer: Self-pay

## 2022-01-19 ENCOUNTER — Other Ambulatory Visit (HOSPITAL_COMMUNITY): Payer: Self-pay

## 2022-01-19 MED ORDER — PANTOPRAZOLE SODIUM 20 MG PO TBEC
20.0000 mg | DELAYED_RELEASE_TABLET | Freq: Every day | ORAL | 0 refills | Status: DC
  Filled 2022-01-19: qty 90, 90d supply, fill #0

## 2022-01-19 NOTE — Telephone Encounter (Signed)
Returned pt's wife call. Refilled Pantoprazole '20mg'$ . Sent to [facility]. Pt given 90 days with no refills. Pt has an appt with Dr. Marius Ditch on Oct 23rd.

## 2022-01-20 ENCOUNTER — Other Ambulatory Visit (HOSPITAL_COMMUNITY): Payer: Self-pay

## 2022-01-22 ENCOUNTER — Ambulatory Visit (INDEPENDENT_AMBULATORY_CARE_PROVIDER_SITE_OTHER): Payer: 59 | Admitting: Gastroenterology

## 2022-01-22 ENCOUNTER — Encounter: Payer: Self-pay | Admitting: Gastroenterology

## 2022-01-22 ENCOUNTER — Other Ambulatory Visit (HOSPITAL_COMMUNITY): Payer: Self-pay

## 2022-01-22 VITALS — BP 130/78 | HR 71 | Temp 98.1°F | Ht 66.0 in | Wt 207.1 lb

## 2022-01-22 DIAGNOSIS — K219 Gastro-esophageal reflux disease without esophagitis: Secondary | ICD-10-CM

## 2022-01-22 NOTE — Progress Notes (Signed)
Cephas Darby, MD [address]  [place]  [place], [place] [address]  Main: [phone]  Fax: [phone]    Gastroenterology Consultation  Referring Provider:     Mikey Kirschner, PA-C Primary Care Physician:  Mikey Kirschner, PA-C Primary Gastroenterologist:  Dr. Cephas Darby Reason for Consultation: Chronic GERD, history of recurrent diverticulitis        HPI:   Kevin [name] is a 66 y.o. male referred by Mikey Kirschner, PA-C  for consultation & management of chronic GERD.  Patient has history of heartburn for which she is maintained on Protonix 20 mg daily, keeps his symptoms under control.  He has gained about 14 pounds since November last year.  He denies any recent episodes of diverticulitis  NSAIDs: None  Antiplts/Anticoagulants/Anti thrombotics: None  GI Procedures:  Upper endoscopy 03/03/2020 - Mild Schatzki ring. Dilated to 58m. - Z-line regular. - Normal esophagus. Biopsied. - Normal stomach. Biopsied. - Normal duodenal bulb, second portion of the duodenum and examined duodenum. DIAGNOSIS:  A.  STOMACH; COLD BIOPSY:  - ANTRAL AND OXYNTIC MUCOSA WITHOUT PATHOLOGIC CHANGES.  - NEGATIVE FOR H. PYLORI, INTESTINAL METAPLASIA, DYSPLASIA, AND  MALIGNANCY.   B.  ESOPHAGUS; COLD BIOPSY:  - STRATIFIED SQUAMOUS EPITHELIUM WITHOUT EOSINOPHILS, NEUTROPHILS, OR  REACTIVE CHANGES.  - NEGATIVE FOR DYSPLASIA AND MALIGNANCY.   Colonoscopy 06/07/2017 - Diverticulosis in the sigmoid colon, in the descending colon and in the ascending colon. - The examination was otherwise normal. - The rectum, sigmoid colon, descending colon, transverse colon, ascending colon and cecum are normal. - Internal hemorrhoids. - No specimens collected.  Past Medical History:  Diagnosis Date   Allergy    Since child   Asthma Childhood   Brachial plexopathy    CKD (chronic kidney disease), stage II - III ([facility]    Coronary artery disease    a. s/p 4 vessel CABG in 05/2008 w/  LIMA-LAD, SVG-Diag, sequential SVG-OM1/OM2; b. s/p PCI/DES x 2 to LAD in 10/2009; c. LHC 03/2013 stable disease and patent LAD stents; d. 10/2016 Cath: LM min irregs, LAD 20p, 136mSR, D2 80ost, LCX small, min irregs, OM1/2/3 min irregs, RCA/RPDA/RPAV min irregs, RPL1/2 nl RPL3 min irregs, VG->D2 nl, VG->OM1->OM2 100, EF 55-65%.   Depression 1988   H/O maze procedure 05/17/2008   a. 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    Hypertension    Memory deficit after cerebral infarction    OSA (obstructive sleep apnea)    Overweight(278.02)    PAF ([facility]oxysmal atrial fibrillation) ([facility]   a. s/p Maze 05/2008, previously on Eliquis->discontinued 05/2016; c. CHADS2VASc => 4 (HTN, stroke x 2, vascular disease)   Patent foramen ovale    a. 05/2008 s/p closure @ time of CABG.   [place]oriatic arthritis ([facility]   Skin cancer    Sleep apnea    [facility]   a. 07/2005 right brain; b. 05/2005 left brain; c. Resultant memory deficits.   [facility]    Past Surgical History:  Procedure Laterality Date   ACUTE PANCREATITIS  5/11   APPENDECTOMY  1984   CARDIAC CATHETERIZATION     CHOLECYSTECTOMY  08/19/2009   COLONOSCOPY WITH PROPOFOL N/A 06/07/2017   Procedure: COLONOSCOPY WITH PROPOFOL;  Surgeon: TaVirgel ManifoldMD;  Location: [facility] ENDOSCOPY;  Service: Endoscopy;  Laterality: N/A;   CORONARY ARTERY BYPASS GRAFT  05/12/2008   x4 by PeterVan Trigt,MD   ESOPHAGOGASTRODUODENOSCOPY N/A 03/03/2020   Procedure: ESOPHAGOGASTRODUODENOSCOPY (EGD);  Surgeon: Virgel Manifold, MD;  Location: [facility] ENDOSCOPY;  Service: Endoscopy;  Laterality: N/A;   LEFT HEART CATH AND CORONARY ANGIOGRAPHY N/A 11/29/2016   Procedure: LEFT HEART CATH AND CORONARY ANGIOGRAPHY;  Surgeon: Wellington Hampshire, MD;  Location: [facility];  Service: Cardiovascular;  Laterality: N/A;   PATENT FORAMEN OVALE CLOSURE     TONSILLECTOMY     AS A CHILD      Current Outpatient Medications:    albuterol (VENTOLIN HFA) 108 (90 Base) MCG/ACT inhaler, Inhale 2 puffs into the lungs every 6 (six) hours as needed for wheezing or shortness of breath., Disp: 8 g, Rfl: 0   ALPRAZolam (XANAX) 0.5 MG tablet, Take 1 to 2 tablets by mouth at bedtime as needed for anxiety or sleep., Disp: 60 tablet, Rfl: 3   ARTHRITIS PAIN RELIEF 650 MG CR tablet, Take 650 mg by mouth every 8 (eight) hours as needed., Disp: , Rfl:    aspirin EC 81 MG tablet, Take 81 mg by mouth daily., Disp: , Rfl:    cetirizine (ZYRTEC) 10 MG tablet, Take 10 mg by mouth at bedtime., Disp: , Rfl:    clopidogrel (PLAVIX) 75 MG tablet, TAKE 1 TABLET BY MOUTH ONCE DAILY, Disp: 90 tablet, Rfl: 2   COVID-19 mRNA bivalent vaccine, Pfizer, (PFIZER COVID-19 VAC BIVALENT) injection, Inject into the muscle., Disp: 0.3 mL, Rfl: 0   desonide (DESOWEN) 0.05 % cream, Apply 1 application topically 2 (two) times daily as needed (for psoriasis)., Disp: 30 g, Rfl: 2   ezetimibe (ZETIA) 10 MG tablet, Take 1 tablet by mouth daily., Disp: 90 tablet, Rfl: 3   isosorbide mononitrate (IMDUR) 30 MG 24 hr tablet, TAKE 1 TABLET BY MOUTH ONCE DAILY, Disp: 90 tablet, Rfl: 3   LEXAPRO 20 MG tablet, TAKE 1 TABLET BY MOUTH DAILY., Disp: 90 tablet, Rfl: 3   metoprolol tartrate (LOPRESSOR) 25 MG tablet, Take 1 tablet by mouth 2 times daily., Disp: 180 tablet, Rfl: 3   nitroGLYCERIN (NITROSTAT) 0.4 MG SL tablet, PLACE 1 TABLET UNDER THE TONGUE EVERY 5 MINUTES AS NEEDED. MAY REPEAT FOR UP TO 3 DOSES. IF NO RELIEF WITH 1ST DOSE, CALL 911., Disp: 25 tablet, Rfl: 0   pantoprazole (PROTONIX) 20 MG tablet, Take 1 tablet (20 mg total) by mouth daily., Disp: 90 tablet, Rfl: 0   ranolazine (RANEXA) 1000 MG SR tablet, Take 1 tablet (1,000 mg total) by mouth 2 (two) times daily., Disp: 180 tablet, Rfl: 3   rosuvastatin (CRESTOR) 40 MG tablet, Take 1 tablet by mouth daily., Disp: 90 tablet, Rfl: 3   tacrolimus (PROTOPIC) 0.1 % ointment,  Apply twice daily to affected areas as needed, Disp: 30 g, Rfl: 2   Family History  Problem Relation Age of Onset   Hypertension Mother    Heart disease Mother    Psychiatric Illness Father    Heart attack Father    Diabetes Sister    Ovarian cancer Sister    Lung cancer Paternal Aunt    Cancer Paternal Uncle    Heart disease Paternal Grandmother    Heart disease Paternal Grandfather    Diabetes Other    Coronary artery disease Other    Diabetes Other    Hypertension Other    Hyperlipidemia Other    Sleep apnea Neg Hx      Social History   Tobacco Use   Smoking  status: Never   Smokeless tobacco: Never  Vaping Use   Vaping Use: Never used  Substance Use Topics   Alcohol use: Yes    Comment: occassionally   Drug use: No    Allergies as of 01/22/2022 - Review Complete 01/22/2022  Allergen Reaction Noted   Strawberry extract Anaphylaxis 12/04/2011   Dilaudid [hydromorphone hcl] Nausea And Vomiting 06/21/2010    Review of Systems:    All systems reviewed and negative except where noted in HPI.   Physical Exam:  BP 130/78 (BP Location: Left Arm, Patient Position: Sitting, Cuff Size: Normal)   Pulse 71   Temp 98.1 F (36.7 C) (Oral)   Ht '5\' 6"'$  (1.676 m)   Wt 207 lb 2 oz (94 kg)   BMI 33.43 kg/m  No LMP for male patient.  General:   Alert,  Well-developed, well-nourished, pleasant and cooperative in NAD Head:  Normocephalic and atraumatic. Eyes:  Sclera clear, no icterus.   Conjunctiva pink. Ears:  Normal auditory acuity. Nose:  No deformity, discharge, or lesions. Mouth:  No deformity or lesions,oropharynx pink & moist. Neck:  Supple; no masses or thyromegaly. Lungs:  Respirations even and unlabored.  Clear throughout to auscultation.   No wheezes, crackles, or rhonchi. No acute distress. Heart:  Regular rate and rhythm; no murmurs, clicks, rubs, or gallops. Abdomen:  Normal bowel sounds. Soft, non-tender and non-distended without masses, hepatosplenomegaly  or hernias noted.  No guarding or rebound tenderness.   Rectal: Not performed Msk:  Symmetrical without gross deformities. Good, equal movement & strength bilaterally. Pulses:  Normal pulses noted. Extremities:  No clubbing or edema.  No cyanosis. Neurologic:  Alert and oriented x3;  grossly normal neurologically. Skin:  Intact without significant lesions or rashes. No jaundice. Psych:  Alert and cooperative. Normal mood and affect.  Imaging Studies: Reviewed  Assessment and Plan:   Leeland Lovelady is a 66 y.o. male with history of A-fib, CVA on aspirin and Plavix is seen in for follow-up of chronic GERD  Chronic GERD, underwent dilation of Schatzki's ring in 2022 Continue Protonix 20 mg daily before meals long-term  Colon cancer screening Schedule colonoscopy in 06/2022  Follow up annually   Cephas Darby, MD

## 2022-01-24 DIAGNOSIS — G4733 Obstructive sleep apnea (adult) (pediatric): Secondary | ICD-10-CM | POA: Diagnosis not present

## 2022-01-26 ENCOUNTER — Ambulatory Visit (INDEPENDENT_AMBULATORY_CARE_PROVIDER_SITE_OTHER): Payer: 59 | Admitting: Physician Assistant

## 2022-01-26 DIAGNOSIS — Z23 Encounter for immunization: Secondary | ICD-10-CM | POA: Diagnosis not present

## 2022-01-29 ENCOUNTER — Other Ambulatory Visit (HOSPITAL_COMMUNITY): Payer: Self-pay

## 2022-01-31 ENCOUNTER — Ambulatory Visit: Payer: 59 | Admitting: Adult Health

## 2022-02-16 ENCOUNTER — Other Ambulatory Visit (HOSPITAL_COMMUNITY): Payer: Self-pay

## 2022-02-17 ENCOUNTER — Other Ambulatory Visit (HOSPITAL_COMMUNITY): Payer: Self-pay

## 2022-02-19 IMAGING — CT CT ABD-PELV W/O CM
2 of 4 series · 15 of 46 positions shown, 17 images · non-contrast
Comparison: 09/25/2017

CLINICAL DATA: Left lower quadrant abdominal pain for the past 2
days. History of diverticulitis.

EXAM:
CT ABDOMEN AND PELVIS WITHOUT CONTRAST
TECHNIQUE: Multidetector CT imaging of the abdomen and pelvis was performed
following the standard protocol without IV contrast.

[Series 2: axial st · axial · 0.88mm/px · z∈[-511,-61]mm · 12 of 100 slices shown, 14 images]
[im 5/100  soft-tissue]
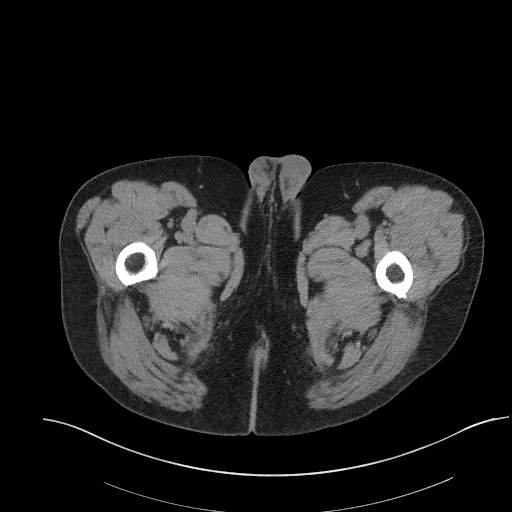
[im 5/100  bone]
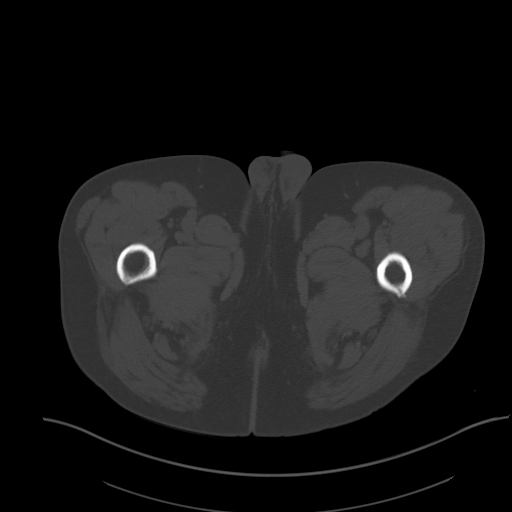
[im 13/100  soft-tissue]
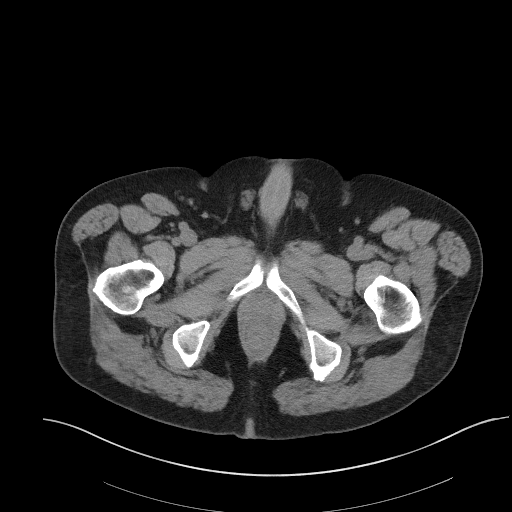
[im 22/100  soft-tissue]
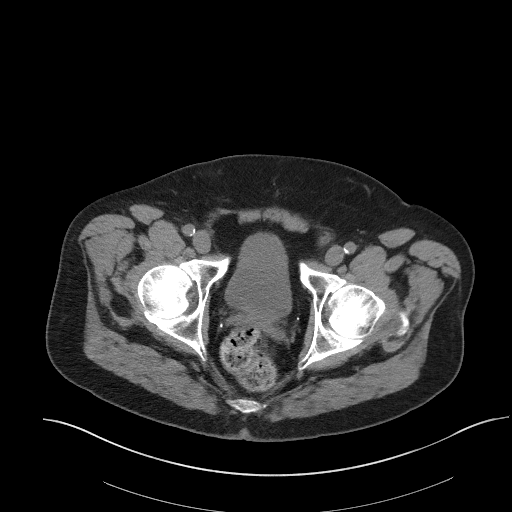
[im 31/100  soft-tissue]
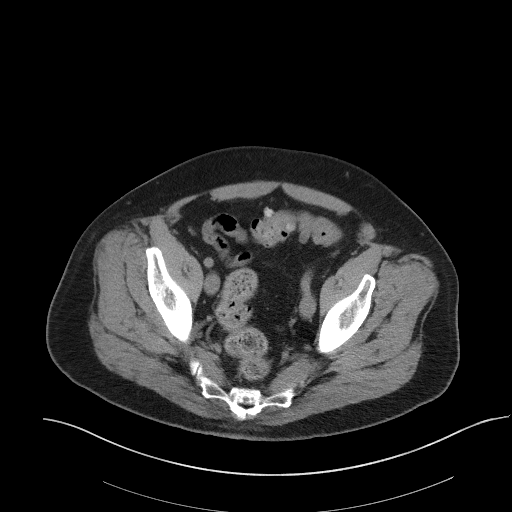
[im 39/100  soft-tissue]
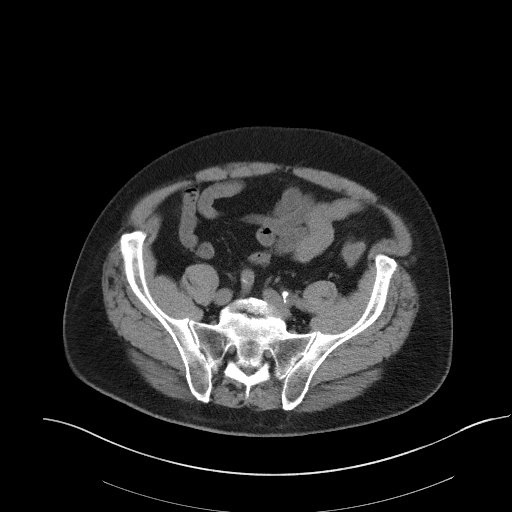
[im 48/100  soft-tissue]
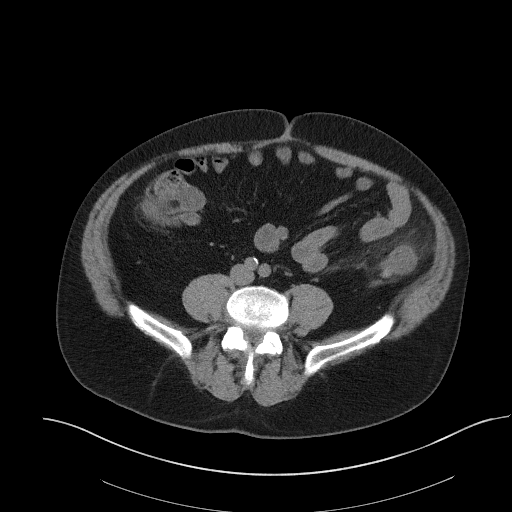
[im 52/100  soft-tissue]
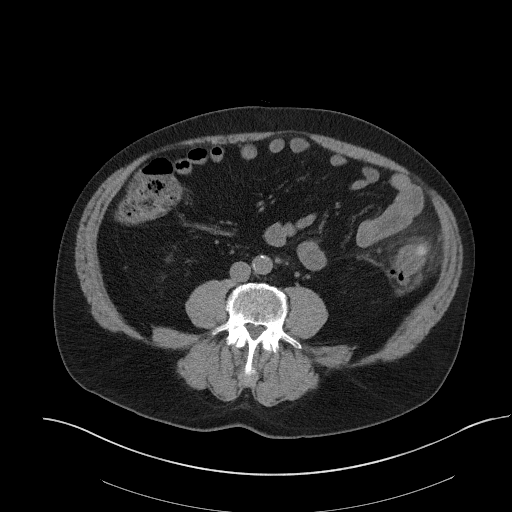
[im 61/100  soft-tissue]
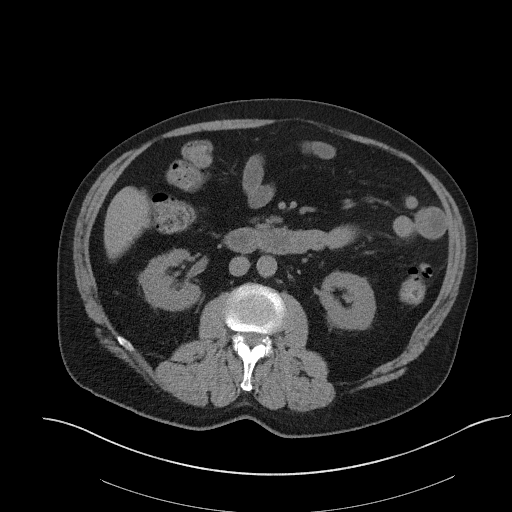
[im 69/100  soft-tissue]
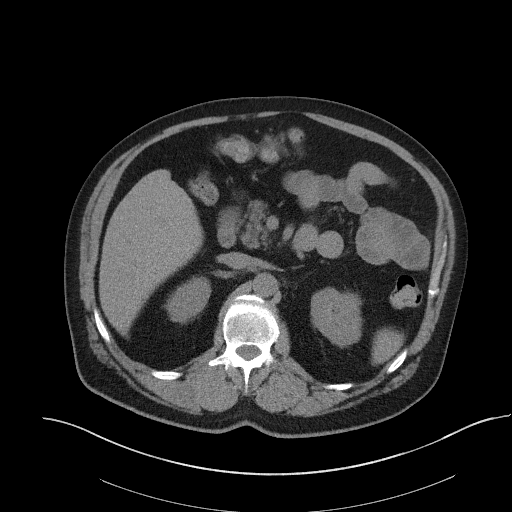
[im 69/100  bone]
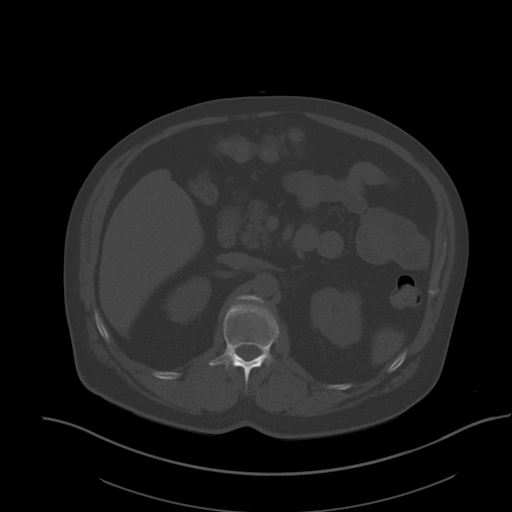
[im 78/100  soft-tissue]
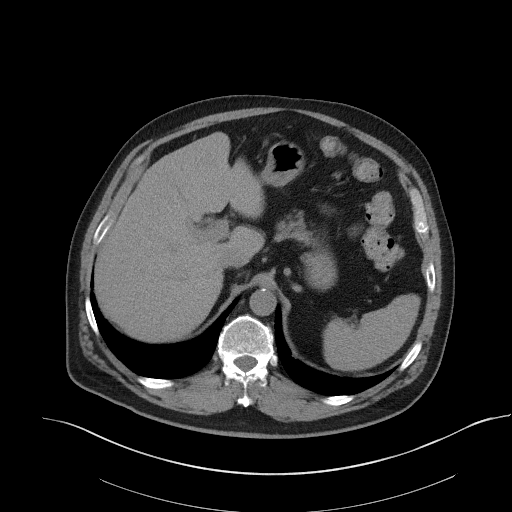
[im 87/100  soft-tissue]
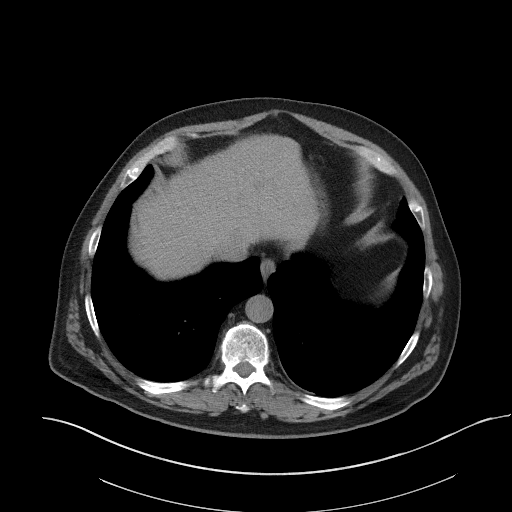
[im 95/100  soft-tissue]
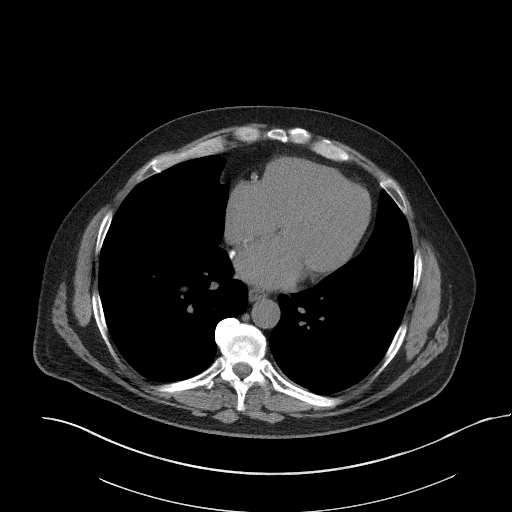

[Series 5: coronal st · coronal · 0.84mm/px · 3 of 108 slices shown]
[im 36/108  soft-tissue]
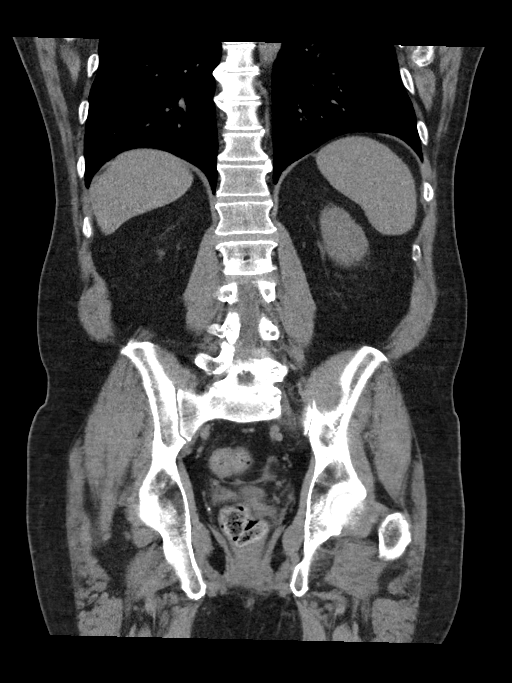
[im 48/108  soft-tissue]
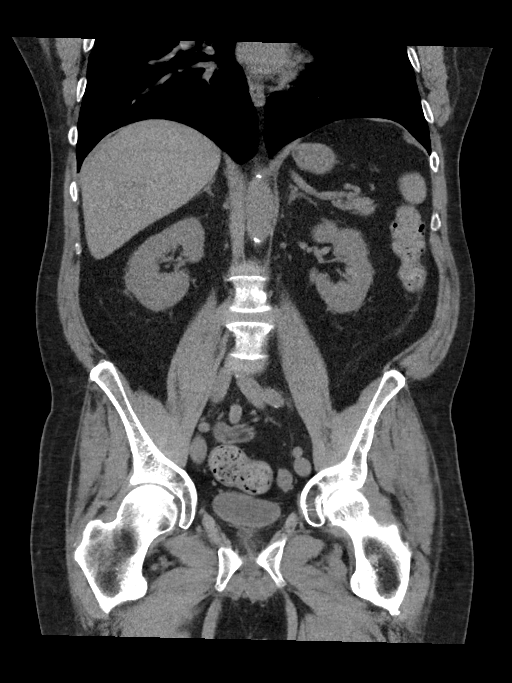
[im 60/108  soft-tissue]
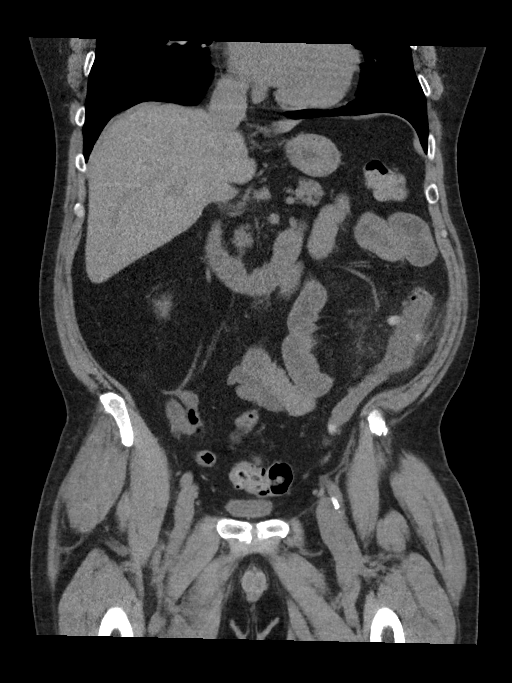

[15 of 46 positions shown; findings below may reference images not displayed]

FINDINGS: The lack of intravenous contrast limits the ability to evaluate
solid abdominal organs.

Lower chest: Limited visualization of the lower thorax is negative
for focal airspace opacity or pleural effusion.

Cardiomegaly. Coronary artery calcifications. No pericardial
effusion.

Hepatobiliary: Normal hepatic contour. Post cholecystectomy. No
ascites.

Pancreas: Normal noncontrast appearance of the pancreas.

Spleen: Normal noncontrast appearance of the spleen.

Adrenals/Urinary Tract: Normal noncontrast appearance of the
bilateral kidneys. No renal stones. No renal stones are seen along
the expected location of either ureter or the urinary bladder. There
is a very minimal amount of symmetric likely age and body habitus
related perinephric stranding. No urinary obstruction.

Normal noncontrast appearance the bilateral adrenal glands.

Normal noncontrast appearance of the urinary bladder given degree of
distention.

Stomach/Bowel: Rather extensive colonic diverticulosis. There is
ill-defined stranding surrounding a prominent diverticuli within the
mid descending colon (axial image 50, series 2; coronal image 48,
series 5) worrisome for an area of acute uncomplicated
diverticulitis. No evidence of perforation or definable/drainable
fluid collection. Moderate colonic stool burden without evidence of
enteric obstruction. Normal noncontrast appearance of the terminal
ileum. The appendix is not visualized, though there is no pericecal
inflammatory change.

Vascular/Lymphatic: Minimal to moderate amount of calcified
atherosclerotic plaque within a normal caliber abdominal aorta.

No bulky retroperitoneal, mesenteric, pelvic or inguinal
lymphadenopathy on this noncontrast examination.

Reproductive: Normal noncontrast within the pelvic cul-de-sac
appearance of the prostate gland. No free fluid.

Other: Regional soft tissues appear normal.

Musculoskeletal: No acute or aggressive osseous abnormalities.
Mild-to-moderate multilevel lumbar spine DDD, worse at L5-S1 with
disc space height loss, endplate irregularity and small posteriorly
directed disc osteophyte complex at this location. Stigmata of dish
within the lower thoracic spine.
IMPRESSION: 1. Findings compatible with acute uncomplicated diverticulitis
involving the mid descending colon. No evidence of perforation or
definable/drainable fluid collection. If not recently performed,
further evaluation with colonoscopy after the resolution of acute
symptoms is recommended to exclude the presence of an underlying
mass or lesion.
2. Moderate colonic stool burden without evidence of enteric
obstruction.
3. Coronary calcifications.  Aortic Atherosclerosis ([id number]).

## 2022-03-02 ENCOUNTER — Encounter: Payer: Self-pay | Admitting: Adult Health

## 2022-03-05 ENCOUNTER — Encounter: Payer: Self-pay | Admitting: *Deleted

## 2022-03-06 ENCOUNTER — Telehealth (INDEPENDENT_AMBULATORY_CARE_PROVIDER_SITE_OTHER): Payer: 59 | Admitting: Adult Health

## 2022-03-06 DIAGNOSIS — Z9989 Dependence on other enabling machines and devices: Secondary | ICD-10-CM | POA: Diagnosis not present

## 2022-03-06 DIAGNOSIS — G4733 Obstructive sleep apnea (adult) (pediatric): Secondary | ICD-10-CM

## 2022-03-06 NOTE — Progress Notes (Signed)
PATIENT: Kevin [name] DOB: [birth year]  REASON FOR VISIT: follow up HISTORY FROM: patient PRIMARY NEUROLOGIST:   Virtual Visit via Video Note  I connected with [name] on 03/06/22 at  3:15 PM EST by a video enabled telemedicine application located remotely at [facility] and verified that I am speaking with the correct person using two identifiers who was located at their own home.   I discussed the limitations of evaluation and management by telemedicine and the availability of in person appointments. The patient expressed understanding and agreed to proceed.   PATIENT: Kevin [name] DOB: [birth year]  REASON FOR VISIT: follow up HISTORY FROM: patient  HISTORY OF PRESENT ILLNESS: Today 03/06/22:  [name] is a 66 year old male with a history of obstructive sleep apnea on CPAP.  He returns today for follow-up.  His download indicates that he uses machine nightly for compliance of 100%.  He uses machine greater than 4 hours each night.  His residual AHI is 1.8 on 4 to 20 cm of water with EPR 2.  He reports that the CPAP is working well for him.  He denies any new issues. REVIEW OF SYSTEMS: Out of a complete 14 system review of symptoms, the patient complains only of the following symptoms, and all other reviewed systems are negative.  ALLERGIES: Allergies  Allergen Reactions   Strawberry Extract Anaphylaxis        Dilaudid [Hydromorphone Hcl] Nausea And Vomiting    HOME MEDICATIONS: Outpatient Medications Prior to Visit  Medication Sig Dispense Refill   albuterol (VENTOLIN HFA) 108 (90 Base) MCG/ACT inhaler Inhale 2 puffs into the lungs every 6 (six) hours as needed for wheezing or shortness of breath. 8 g 0   ALPRAZolam (XANAX) 0.5 MG tablet Take 1 to 2 tablets by mouth at bedtime as needed for anxiety or sleep. 60 tablet 3   ARTHRITIS PAIN RELIEF 650 MG CR tablet Take 650 mg by mouth every 8 (eight) hours as needed.     aspirin EC 81 MG tablet  Take 81 mg by mouth daily.     cetirizine (ZYRTEC) 10 MG tablet Take 10 mg by mouth at bedtime.     clopidogrel (PLAVIX) 75 MG tablet TAKE 1 TABLET BY MOUTH ONCE DAILY 90 tablet 2   COVID-19 mRNA bivalent vaccine, Pfizer, (PFIZER COVID-19 VAC BIVALENT) injection Inject into the muscle. 0.3 mL 0   desonide (DESOWEN) 0.05 % cream Apply 1 application topically 2 (two) times daily as needed (for psoriasis). 30 g 2   ezetimibe (ZETIA) 10 MG tablet Take 1 tablet by mouth daily. 90 tablet 3   isosorbide mononitrate (IMDUR) 30 MG 24 hr tablet TAKE 1 TABLET BY MOUTH ONCE DAILY 90 tablet 3   LEXAPRO 20 MG tablet TAKE 1 TABLET BY MOUTH DAILY. 90 tablet 3   metoprolol tartrate (LOPRESSOR) 25 MG tablet Take 1 tablet by mouth 2 times daily. 180 tablet 3   nitroGLYCERIN (NITROSTAT) 0.4 MG SL tablet PLACE 1 TABLET UNDER THE TONGUE EVERY 5 MINUTES AS NEEDED. MAY REPEAT FOR UP TO 3 DOSES. IF NO RELIEF WITH 1ST DOSE, CALL 911. 25 tablet 0   pantoprazole (PROTONIX) 20 MG tablet Take 1 tablet (20 mg total) by mouth daily. 90 tablet 0   ranolazine (RANEXA) 1000 MG SR tablet Take 1 tablet (1,000 mg total) by mouth 2 (two) times daily. 180 tablet 3   rosuvastatin (CRESTOR) 40 MG tablet Take 1 tablet by mouth daily. 90 tablet  3   tacrolimus (PROTOPIC) 0.1 % ointment Apply twice daily to affected areas as needed 30 g 2   No facility-administered medications prior to visit.    PAST MEDICAL HISTORY: Past Medical History:  Diagnosis Date   Allergy    Since child   Asthma Childhood   Brachial plexopathy    CKD (chronic kidney disease), stage II - III ([place])    Coronary artery disease    a. s/p 4 vessel CABG in 05/2008 w/ LIMA-LAD, SVG-Diag, sequential SVG-OM1/OM2; b. s/p PCI/DES x 2 to LAD in 10/2009; c. LHC 03/2013 stable disease and patent LAD stents; d. 10/2016 Cath: LM min irregs, LAD 20p, 30mISR, D2 80ost, LCX small, min irregs, OM1/2/3 min irregs, RCA/RPDA/RPAV min irregs, RPL1/2 nl RPL3 min irregs, VG->D2 nl,  VG->OM1->OM2 100, EF 55-65%.   Depression 1988   H/O maze procedure 05/17/2008   a. 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    Hypertension    Memory deficit after cerebral infarction    OSA (obstructive sleep apnea)    Overweight(278.02)    PAF (paroxysmal atrial fibrillation) ([place]    a. s/p Maze 05/2008, previously on Eliquis->discontinued 05/2016; c. CHADS2VASc => 4 (HTN, stroke x 2, vascular disease)   Patent foramen ovale    a. 05/2008 s/p closure @ time of CABG.   [place]atic arthritis [place]    Skin cancer    Sleep apnea    Stroke ([facility]    a. 07/2005 right brain; b. 05/2005 left brain; c. Resultant memory deficits.   Thrombocytopenia ([place]     PAST SURGICAL HISTORY: Past Surgical History:  Procedure Laterality Date   ACUTE PANCREATITIS  5/11   APPENDECTOMY  1984   CARDIAC CATHETERIZATION     CHOLECYSTECTOMY  08/19/2009   COLONOSCOPY WITH PROPOFOL N/A 06/07/2017   Procedure: COLONOSCOPY WITH PROPOFOL;  Surgeon: TVirgel Manifold MD;  Location: [facility] ENDOSCOPY;  Service: Endoscopy;  Laterality: N/A;   CORONARY ARTERY BYPASS GRAFT  05/12/2008   x4 by PeterVan Trigt,MD   ESOPHAGOGASTRODUODENOSCOPY N/A 03/03/2020   Procedure: ESOPHAGOGASTRODUODENOSCOPY (EGD);  Surgeon: TVirgel Manifold MD;  [facility];  Service: Endoscopy;  Laterality: N/A;   LEFT HEART CATH AND CORONARY ANGIOGRAPHY N/A 11/29/2016   Procedure: LEFT HEART CATH AND CORONARY ANGIOGRAPHY;  Surgeon: AWellington Hampshire MD;  [facility];  Service: Cardiovascular;  Laterality: N/A;   PATENT FORAMEN OVALE CLOSURE     TONSILLECTOMY     AS A CHILD    FAMILY HISTORY: Family History  Problem Relation Age of Onset   Hypertension Mother    Heart disease Mother    Psychiatric Illness Father    Heart attack Father    Diabetes Sister    Ovarian cancer Sister    Lung cancer Paternal Aunt    Cancer Paternal Uncle     Heart disease Paternal Grandmother    Heart disease Paternal Grandfather    Diabetes Other    Coronary artery disease Other    Diabetes Other    Hypertension Other    Hyperlipidemia Other    Sleep apnea Neg Hx     SOCIAL HISTORY: Social History   Socioeconomic History   Marital status: Married    Spouse name: SWynona Canes  Number of children: 3   Years of education: Not on file   Highest education level: Not on file  Occupational History   Occupation: Full time    Employer: [employer]  Tobacco Use   Smoking status: Never   Smokeless tobacco: Never  Vaping Use   Vaping Use: Never used  Substance and Sexual Activity   Alcohol use: Yes    Comment: occassionally   Drug use: No   Sexual activity: Yes    Birth control/protection: None  Other Topics Concern   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 Went to high school. He has worked at his present job for 22 years. He has been married for 34 years. They have 3 children. 2 of his daughters live in [place]. He drinks less than two cups of caffeine per day. He does not use  tobacco or recreational drugs. He has rare alcohol intake. Lives with wife and daughter      OSA diagnosed at [facility] , had his  sleep studies there  reviewed them all in detail today he  has retrognathia, sinusitis,  rhinitis      His ESS remains very high  at 16 and FSS at 29 . His falls assessment tool score is 5.   nasonex, refitted mask, the recent  HST failed to document that  apnea is present at all. education about REM BD and EDS, narcolepsy , cataplexy.  45 minutes.    Social Determinants of Health   Financial Resource Strain: Not on file  Food Insecurity: Not on file  Transportation Needs: Not on file  Physical Activity: Not on file  Stress: Not on file  Social Connections: Not on file  Intimate Partner Violence:  Not on file      PHYSICAL EXAM Generalized: Well developed, in no acute distress   Neurological examination  Mentation: Alert oriented to time, place, history taking. Follows all commands speech and language fluent Cranial nerve II-XII:Extraocular movements were full. Facial symmetry noted. uvula tongue midline. Head turning and shoulder shrug  were normal and symmetric. Reflexes: UTA  DIAGNOSTIC DATA (LABS, IMAGING, TESTING) - I reviewed patient records, labs, notes, testing and imaging myself where available.  Lab Results  Component Value Date   WBC 5.5 11/17/2021   HGB 12.5 (L) 11/17/2021   HCT 37.0 (L) 11/17/2021   MCV 96 11/17/2021   PLT 178 11/17/2021      Component Value Date/Time   NA 141 11/17/2021 0821   NA 141 03/18/2013 1739   K 4.4 11/17/2021 0821   K 4.0 03/18/2013 1739   CL 105 11/17/2021 0821   CL 107 03/18/2013 1739   CO2 23 11/17/2021 0821   CO2 30 03/18/2013 1739   GLUCOSE 108 (H) 11/17/2021 0821   GLUCOSE 106 (H) 08/24/2020 1218   GLUCOSE 79 03/18/2013 1739   BUN 19 11/17/2021 0821   BUN 23 (H) 03/18/2013 1739   CREATININE 1.36 (H) 11/17/2021 0821   CREATININE 1.50 (H) 12/18/2013 1008   CALCIUM 9.2 11/17/2021 0821   CALCIUM 9.2 03/18/2013 1739   PROT 6.6 11/17/2021 0821   ALBUMIN 4.5 11/17/2021 0821   AST 16 11/17/2021 0821   ALT 12 11/17/2021 0821   ALKPHOS 42 (L) 11/17/2021 0821   BILITOT 0.6 11/17/2021 0821   GFRNONAA >60 08/24/2020 1218   GFRNONAA 50 (L) 12/18/2013 1008   GFRAA 63 03/29/2020 1027   GFRAA 58 (L) 12/18/2013 1008   Lab Results  Component Value Date   CHOL 107  11/17/2021   HDL 54 11/17/2021   LDLCALC 39 11/17/2021   TRIG 66 11/17/2021   CHOLHDL 2.0 11/17/2021   Lab Results  Component Value Date   HGBA1C  05/11/2008    5.5 (NOTE)   The ADA recommends the following therapeutic goal for glycemic   control related to Hgb A1C measurement:   Goal of Therapy:   < 7.0% Hgb A1C   Reference: American Diabetes Association:  Clinical Practice   Recommendations 2008, Diabetes Care,  2008, 31:(Suppl 1).   Lab Results  Component Value Date   VITAMINB12 218 01/24/2021   Lab Results  Component Value Date   TSH 2.210 12/08/2020      ASSESSMENT AND PLAN 66 y.o. year old male  has a past medical history of Allergy, Asthma (Childhood), Brachial plexopathy, CKD (chronic kidney disease), stage II - III ([place]), Coronary artery disease, Depression (1988), H/O maze procedure (05/17/2008), Hyperlipidemia, Hypersomnia, organic (09/24/2012), Hypertension, Memory deficit after cerebral infarction, OSA (obstructive sleep apnea), Overweight(278.02), PAF (paroxysmal atrial fibrillation) ([place]), Patent foramen ovale, Psoriatic arthritis ([place]), Skin cancer, Sleep apnea, Stroke ([place]), and Thrombocytopenia ([place]). here with:  OSA on CPAP  CPAP compliance excellent Residual AHI is good Encouraged patient to continue using CPAP nightly and > 4 hours each night F/U in 1 year or sooner if needed    Ward Givens, MSN, NP-C 03/06/2022, 3:23 PM [facility] [address] [place], [place] [address] [phone]

## 2022-03-16 DIAGNOSIS — H2513 Age-related nuclear cataract, bilateral: Secondary | ICD-10-CM | POA: Diagnosis not present

## 2022-03-21 ENCOUNTER — Other Ambulatory Visit (HOSPITAL_COMMUNITY): Payer: Self-pay

## 2022-03-21 ENCOUNTER — Other Ambulatory Visit: Payer: Self-pay

## 2022-03-22 ENCOUNTER — Other Ambulatory Visit: Payer: Self-pay | Admitting: Cardiovascular Disease

## 2022-03-22 ENCOUNTER — Other Ambulatory Visit: Payer: Self-pay | Admitting: Gastroenterology

## 2022-03-22 ENCOUNTER — Other Ambulatory Visit (HOSPITAL_COMMUNITY): Payer: Self-pay

## 2022-03-22 ENCOUNTER — Other Ambulatory Visit: Payer: Self-pay | Admitting: Physician Assistant

## 2022-03-22 ENCOUNTER — Other Ambulatory Visit: Payer: Self-pay

## 2022-03-22 DIAGNOSIS — F419 Anxiety disorder, unspecified: Secondary | ICD-10-CM

## 2022-03-22 MED ORDER — ISOSORBIDE MONONITRATE ER 30 MG PO TB24
30.0000 mg | ORAL_TABLET | Freq: Every day | ORAL | 3 refills | Status: DC
  Filled 2022-03-22 – 2022-04-29 (×3): qty 90, 90d supply, fill #0
  Filled 2022-07-24 – 2022-07-29 (×2): qty 90, 90d supply, fill #1

## 2022-03-22 MED ORDER — METOPROLOL TARTRATE 25 MG PO TABS
25.0000 mg | ORAL_TABLET | Freq: Two times a day (BID) | ORAL | 3 refills | Status: DC
  Filled 2022-03-22 – 2022-05-19 (×2): qty 180, 90d supply, fill #0

## 2022-03-22 MED ORDER — CLOPIDOGREL BISULFATE 75 MG PO TABS
75.0000 mg | ORAL_TABLET | Freq: Every day | ORAL | 2 refills | Status: DC
  Filled 2022-03-22 – 2022-06-18 (×4): qty 90, 90d supply, fill #0

## 2022-03-22 MED ORDER — EZETIMIBE 10 MG PO TABS
10.0000 mg | ORAL_TABLET | Freq: Every day | ORAL | 3 refills | Status: DC
  Filled 2022-03-22 – 2022-04-29 (×3): qty 90, 90d supply, fill #0
  Filled 2022-07-24 – 2022-07-29 (×2): qty 90, 90d supply, fill #1

## 2022-03-22 MED ORDER — PANTOPRAZOLE SODIUM 20 MG PO TBEC
20.0000 mg | DELAYED_RELEASE_TABLET | Freq: Every day | ORAL | 0 refills | Status: DC
  Filled 2022-03-22 – 2022-04-29 (×3): qty 90, 90d supply, fill #0

## 2022-03-22 MED ORDER — ROSUVASTATIN CALCIUM 40 MG PO TABS
40.0000 mg | ORAL_TABLET | Freq: Every day | ORAL | 3 refills | Status: DC
  Filled 2022-03-22 – 2022-04-29 (×3): qty 90, 90d supply, fill #0
  Filled 2022-07-30: qty 90, 90d supply, fill #1

## 2022-04-14 ENCOUNTER — Other Ambulatory Visit: Payer: Self-pay | Admitting: Physician Assistant

## 2022-04-14 DIAGNOSIS — F419 Anxiety disorder, unspecified: Secondary | ICD-10-CM

## 2022-04-16 ENCOUNTER — Other Ambulatory Visit (HOSPITAL_COMMUNITY): Payer: Self-pay

## 2022-04-16 ENCOUNTER — Other Ambulatory Visit: Payer: Self-pay

## 2022-04-16 ENCOUNTER — Encounter: Payer: Self-pay | Admitting: Pharmacist

## 2022-04-17 ENCOUNTER — Other Ambulatory Visit: Payer: Self-pay

## 2022-04-17 DIAGNOSIS — D2271 Melanocytic nevi of right lower limb, including hip: Secondary | ICD-10-CM | POA: Diagnosis not present

## 2022-04-17 DIAGNOSIS — X32XXXA Exposure to sunlight, initial encounter: Secondary | ICD-10-CM | POA: Diagnosis not present

## 2022-04-17 DIAGNOSIS — D225 Melanocytic nevi of trunk: Secondary | ICD-10-CM | POA: Diagnosis not present

## 2022-04-17 DIAGNOSIS — L400 Psoriasis vulgaris: Secondary | ICD-10-CM | POA: Diagnosis not present

## 2022-04-17 DIAGNOSIS — L218 Other seborrheic dermatitis: Secondary | ICD-10-CM | POA: Diagnosis not present

## 2022-04-17 DIAGNOSIS — Z85828 Personal history of other malignant neoplasm of skin: Secondary | ICD-10-CM | POA: Diagnosis not present

## 2022-04-17 DIAGNOSIS — L570 Actinic keratosis: Secondary | ICD-10-CM | POA: Diagnosis not present

## 2022-04-17 MED ORDER — CLOBETASOL PROPIONATE 0.05 % EX OINT
1.0000 | TOPICAL_OINTMENT | Freq: Two times a day (BID) | CUTANEOUS | 2 refills | Status: AC
  Filled 2022-04-17: qty 30, 30d supply, fill #0

## 2022-04-17 MED ORDER — DESONIDE 0.05 % EX CREA
1.0000 | TOPICAL_CREAM | Freq: Two times a day (BID) | CUTANEOUS | 2 refills | Status: AC
  Filled 2022-04-17: qty 30, 30d supply, fill #0
  Filled 2022-07-01: qty 30, 30d supply, fill #1
  Filled 2022-12-31: qty 30, 30d supply, fill #2

## 2022-04-17 MED ORDER — KETOCONAZOLE 2 % EX SHAM
1.0000 | MEDICATED_SHAMPOO | CUTANEOUS | 11 refills | Status: AC
  Filled 2022-04-17: qty 120, 30d supply, fill #0

## 2022-04-20 ENCOUNTER — Other Ambulatory Visit: Payer: Self-pay

## 2022-04-20 MED ORDER — COVID-19 MRNA VAC-TRIS(PFIZER) 30 MCG/0.3ML IM SUSY
PREFILLED_SYRINGE | INTRAMUSCULAR | 0 refills | Status: DC
  Filled 2022-04-20: qty 0.3, 1d supply, fill #0

## 2022-04-20 MED ORDER — INFLUENZA VAC A&B SA ADJ QUAD 0.5 ML IM PRSY
PREFILLED_SYRINGE | INTRAMUSCULAR | 0 refills | Status: DC
  Filled 2022-04-20: qty 0.5, 1d supply, fill #0

## 2022-04-24 DIAGNOSIS — G4733 Obstructive sleep apnea (adult) (pediatric): Secondary | ICD-10-CM | POA: Diagnosis not present

## 2022-04-29 ENCOUNTER — Other Ambulatory Visit: Payer: Self-pay | Admitting: Physician Assistant

## 2022-04-29 DIAGNOSIS — F419 Anxiety disorder, unspecified: Secondary | ICD-10-CM

## 2022-04-30 ENCOUNTER — Ambulatory Visit: Payer: Self-pay

## 2022-04-30 ENCOUNTER — Encounter: Payer: Self-pay | Admitting: Physician Assistant

## 2022-04-30 ENCOUNTER — Ambulatory Visit: Payer: Commercial Managed Care - PPO | Admitting: Physician Assistant

## 2022-04-30 ENCOUNTER — Ambulatory Visit (INDEPENDENT_AMBULATORY_CARE_PROVIDER_SITE_OTHER): Payer: 59 | Admitting: Physician Assistant

## 2022-04-30 ENCOUNTER — Other Ambulatory Visit: Payer: Self-pay

## 2022-04-30 ENCOUNTER — Other Ambulatory Visit (HOSPITAL_COMMUNITY): Payer: Self-pay

## 2022-04-30 VITALS — BP 122/78 | HR 68 | Temp 98.1°F | Resp 16 | Wt 211.0 lb

## 2022-04-30 DIAGNOSIS — F419 Anxiety disorder, unspecified: Secondary | ICD-10-CM | POA: Diagnosis not present

## 2022-04-30 MED ORDER — ALPRAZOLAM 0.5 MG PO TABS
0.5000 mg | ORAL_TABLET | Freq: Every evening | ORAL | 0 refills | Status: DC | PRN
  Filled 2022-04-30 – 2022-05-04 (×2): qty 60, 30d supply, fill #0

## 2022-04-30 NOTE — Progress Notes (Deleted)
Established patient visit   Patient: Kevin [name]   DOB: 1956-03-23   67 y.o. Male  MRN: [id number] Visit Date: 04/30/2022  Today's healthcare provider: Mikey Kirschner, PA-C   No chief complaint on file.  Subjective    HPI  Anxiety, Follow-up  He was last seen for anxiety {NUMBERS 1-12:18279} {days/wks/mos/yrs:310907} ago. Changes made at last visit include ***.   He reports {excellent/good/fair/poor:19665} compliance with treatment. He reports {good/fair/poor:18685} tolerance of treatment. He {is/is not:21021397} having side effects. {document side effects if present:1}  He feels his anxiety is {Desc; severity:60313} and {improved/worse/unchanged:3041574} since last visit.  Symptoms: {Yes/No:20286} chest pain {Yes/No:20286} difficulty concentrating  {Yes/No:20286} dizziness {Yes/No:20286} fatigue  {Yes/No:20286} feelings of losing control {Yes/No:20286} insomnia  {Yes/No:20286} irritable {Yes/No:20286} palpitations  {Yes/No:20286} panic attacks {Yes/No:20286} racing thoughts  {Yes/No:20286} shortness of breath {Yes/No:20286} sweating  {Yes/No:20286} tremors/shakes    GAD-7 Results    04/04/2021    3:11 PM  GAD-7 Generalized Anxiety Disorder Screening Tool  1. Feeling Nervous, Anxious, or on Edge 0  2. Not Being Able to Stop or Control Worrying 0  3. Worrying Too Much About Different Things 3  4. Trouble Relaxing 0  5. Being So Restless it's Hard To Sit Still 0  6. Becoming Easily Annoyed or Irritable 0  7. Feeling Afraid As If Something Awful Might Happen 0  Total GAD-7 Score 3  Difficulty At Work, Home, or Getting  Along With Others? Not difficult at all    PHQ-9 Scores    01/31/2021    1:55 PM 10/24/2020    1:33 PM 03/15/2020    1:10 PM  PHQ9 SCORE ONLY  PHQ-9 Total Score 0 0 0    ---------------------------------------------------------------------------------------------------   Medications: Outpatient Medications Prior to Visit  Medication Sig    albuterol (VENTOLIN HFA) 108 (90 Base) MCG/ACT inhaler Inhale 2 puffs into the lungs every 6 (six) hours as needed for wheezing or shortness of breath.   ALPRAZolam (XANAX) 0.5 MG tablet Take 1 to 2 tablets by mouth at bedtime as needed for anxiety or sleep.   ARTHRITIS PAIN RELIEF 650 MG CR tablet Take 650 mg by mouth every 8 (eight) hours as needed.   aspirin EC 81 MG tablet Take 81 mg by mouth daily.   cetirizine (ZYRTEC) 10 MG tablet Take 10 mg by mouth at bedtime.   clobetasol ointment (TEMOVATE) [id number] % Apply 1 Application topically 2 (two) times daily until smooth, then stop and use as needed.   clopidogrel (PLAVIX) 75 MG tablet Take 1 tablet (75 mg total) by mouth daily. (pls call to schedule 12 month follow up [phone])   COVID-19 mRNA bivalent vaccine, Pfizer, (PFIZER COVID-19 VAC BIVALENT) injection Inject into the muscle.   COVID-19 mRNA vaccine 2023-2024 (COMIRNATY) syringe Inject into the muscle.   desonide (DESOWEN) 0.05 % cream Apply 1 application topically 2 (two) times daily as needed (for psoriasis).   desonide (DESOWEN) 0.05 % cream Apply 1 Application topically 2 (two) times daily to affected areas on face and neck when flared.   ezetimibe (ZETIA) 10 MG tablet Take 1 tablet (10 mg total) by mouth daily. (pls call to sch 12 month follow up [phone])   influenza vaccine adjuvanted (FLUAD) 0.5 ML injection Inject into the muscle.   isosorbide mononitrate (IMDUR) 30 MG 24 hr tablet Take 1 tablet (30 mg total) by mouth daily. (pls call to sch 12 month follow up [phone])   ketoconazole (NIZORAL) 2 % shampoo Apply 1 Application  topically to affected area (leave on for 5 minutes) , rinse well, use 2-3 times per week.   LEXAPRO 20 MG tablet TAKE 1 TABLET BY MOUTH DAILY.   metoprolol tartrate (LOPRESSOR) 25 MG tablet Take 1 tablet (25 mg total) by mouth 2 (two) times daily. (pls call to sch 12 month follow up [phone])   nitroGLYCERIN (NITROSTAT) 0.4 MG SL tablet PLACE  1 TABLET UNDER THE TONGUE EVERY 5 MINUTES AS NEEDED. MAY REPEAT FOR UP TO 3 DOSES. IF NO RELIEF WITH 1ST DOSE, CALL 911.   pantoprazole (PROTONIX) 20 MG tablet Take 1 tablet (20 mg total) by mouth daily.   ranolazine (RANEXA) 1000 MG SR tablet Take 1 tablet (1,000 mg total) by mouth 2 (two) times daily.   rosuvastatin (CRESTOR) 40 MG tablet Take 1 tablet (40 mg total) by mouth daily. (pls call to sch 12 month follow up [phone])   tacrolimus (PROTOPIC) 0.1 % ointment Apply twice daily to affected areas as needed   No facility-administered medications prior to visit.    Review of Systems  {Labs  Heme  Chem  Endocrine  Serology  Results Review (optional):23779}   Objective    There were no vitals taken for this visit. {Show previous vital signs (optional):23777}  Physical Exam  ***  No results found for any visits on 04/30/22.  Assessment & Plan     ***  No follow-ups on file.      {provider attestation***:1}   Mikey Kirschner, PA-C  [facility] [phone] (phone) [phone] (fax)  [place]

## 2022-04-30 NOTE — Patient Instructions (Signed)
[email or web address]  -virtual therapy

## 2022-04-30 NOTE — Telephone Encounter (Signed)
  Chief Complaint: Anxiety Symptoms: overwhelmed Frequency: awhile Pertinent Negatives: Patient denies self harm Disposition: '[]'$ ED /'[]'$ Urgent Care (no appt availability in office) / '[]'$ Appointment(In office/virtual)/ '[]'$  [facility]/ '[]'$ Home Care/ '[]'$ Refused Recommended Disposition /'[]'$ [facility]/ '[x]'$  Follow-up with PCP Additional Notes: Pt has many challenges right now and is feeling overwhelmed. Pt has attp this afternoon.    Reason for Disposition . MODERATE anxiety (e.g., persistent or frequent anxiety symptoms; interferes with sleep, school, or work)  Answer Assessment - Initial Assessment Questions 1. CONCERN: "Did anything happen that prompted you to call today?"      Missed appointment 2. ANXIETY SYMPTOMS: "Can you describe how you (your loved one; patient) have been feeling?" (e.g., tense, restless, panicky, anxious, keyed up, overwhelmed, sense of impending doom).      Overwhelmed 3. ONSET: "How long have you been feeling this way?" (e.g., hours, days, weeks)     Weeks 4. SEVERITY: "How would you rate the level of anxiety?" (e.g., 0 - 10; or mild, moderate, severe).     moderate 5. FUNCTIONAL IMPAIRMENT: "How have these feelings affected your ability to do daily activities?" "Have you had more difficulty than usual doing your normal daily activities?" (e.g., getting better, same, worse; self-care, school, work, interactions)      6. HISTORY: "Have you felt this way before?" "Have you ever been diagnosed with an anxiety problem in the past?" (e.g., generalized anxiety disorder, panic attacks, PTSD). If Yes, ask: "How was this problem treated?" (e.g., medicines, counseling, etc.)      7. RISK OF HARM - SUICIDAL IDEATION: "Do you ever have thoughts of hurting or killing yourself?" If Yes, ask:  "Do you have these feelings now?" "Do you have a plan on how you would do this?"      8. TREATMENT:  "What has been done so far to treat this anxiety?" (e.g., medicines,  relaxation strategies). "What has helped?"      9. TREATMENT - THERAPIST: "Do you have a counselor or therapist? Name?"     yes 10. POTENTIAL TRIGGERS: "Do you drink caffeinated beverages (e.g., coffee, colas, teas), and how much daily?" "Do you drink alcohol or use any drugs?" "Have you started any new medicines recently?"        11. PATIENT SUPPORT: "Who is with you now?" "Who do you live with?" "Do you have family or friends who you can talk to?"         12. OTHER SYMPTOMS: "Do you have any other symptoms?" (e.g., feeling depressed, trouble concentrating, trouble sleeping, trouble breathing, palpitations or fast heartbeat, chest pain, sweating, nausea, or diarrhea)        13. PREGNANCY: "Is there any chance you are pregnant?" "When was your last menstrual period?"  Protocols used: Anxiety and Panic Attack-A-AH

## 2022-04-30 NOTE — Progress Notes (Signed)
I,Sulibeya S Dimas,acting as a Education administrator for [employer], PA-C.,have documented all relevant documentation on the behalf of Mikey Kirschner, PA-C,as directed by  Mikey Kirschner, PA-C while in the presence of Mikey Kirschner, PA-C.     Established patient visit   Patient: Kevin [name]   DOB: December 30, 1955   67 y.o. Male  MRN: [id number] Visit Date: 04/30/2022  Today's healthcare provider: Mikey Kirschner, PA-C   Chief Complaint  Patient presents with   Anxiety   Subjective    HPI  Anxiety, Follow-up  He was last seen for anxiety 6 months ago. Changes made at last visit include continue lexapro 20 mg daily and prn xanax .   He reports excellent compliance with treatment. He reports excellent tolerance of treatment. He is not having side effects.  He feels his anxiety is severe and Worse since last visit. He reports under a lot of stress at work. --Significant stress with work and home life. Pt has struggles with not being able to control all aspects. He reports taking Xanax 1 tablet daily. Is was thinking of adding at least 1/2 a tablet more.     GAD-7 Results    04/30/2022    2:06 PM 04/04/2021    3:11 PM  GAD-7 Generalized Anxiety Disorder Screening Tool  1. Feeling Nervous, Anxious, or on Edge 2 0  2. Not Being Able to Stop or Control Worrying 3 0  3. Worrying Too Much About Different Things 0 3  4. Trouble Relaxing 3 0  5. Being So Restless it's Hard To Sit Still 0 0  6. Becoming Easily Annoyed or Irritable 3 0  7. Feeling Afraid As If Something Awful Might Happen 0 0  Total GAD-7 Score 11 3  Difficulty At Work, Home, or Getting  Along With Others? Somewhat difficult Not difficult at all    PHQ-9 Scores    04/30/2022    2:04 PM 01/31/2021    1:55 PM 10/24/2020    1:33 PM  PHQ9 SCORE ONLY  PHQ-9 Total Score 4 0 0    ---------------------------------------------------------------------------------------------------   Medications: Outpatient Medications Prior to  Visit  Medication Sig   albuterol (VENTOLIN HFA) 108 (90 Base) MCG/ACT inhaler Inhale 2 puffs into the lungs every 6 (six) hours as needed for wheezing or shortness of breath.   ALPRAZolam (XANAX) 0.5 MG tablet Take 1 to 2 tablets by mouth at bedtime as needed for anxiety or sleep.   ARTHRITIS PAIN RELIEF 650 MG CR tablet Take 650 mg by mouth every 8 (eight) hours as needed.   aspirin EC 81 MG tablet Take 81 mg by mouth daily.   cetirizine (ZYRTEC) 10 MG tablet Take 10 mg by mouth at bedtime.   clobetasol ointment (TEMOVATE) 3.81 % Apply 1 Application topically 2 (two) times daily until smooth, then stop and use as needed.   clopidogrel (PLAVIX) 75 MG tablet Take 1 tablet (75 mg total) by mouth daily. (pls call to schedule 12 month follow up [phone])   COVID-19 mRNA bivalent vaccine, Pfizer, (PFIZER COVID-19 VAC BIVALENT) injection Inject into the muscle.   COVID-19 mRNA vaccine 2023-2024 (COMIRNATY) syringe Inject into the muscle.   desonide (DESOWEN) 0.05 % cream Apply 1 application topically 2 (two) times daily as needed (for psoriasis).   desonide (DESOWEN) 0.05 % cream Apply 1 Application topically 2 (two) times daily to affected areas on face and neck when flared.   ezetimibe (ZETIA) 10 MG tablet Take 1 tablet (10 mg total) by mouth  daily. (pls call to sch 12 month follow up [phone])   isosorbide mononitrate (IMDUR) 30 MG 24 hr tablet Take 1 tablet (30 mg total) by mouth daily. (pls call to sch 12 month follow up [phone])   ketoconazole (NIZORAL) 2 % shampoo Apply 1 Application topically to affected area (leave on for 5 minutes) , rinse well, use 2-3 times per week.   LEXAPRO 20 MG tablet TAKE 1 TABLET BY MOUTH DAILY.   metoprolol tartrate (LOPRESSOR) 25 MG tablet Take 1 tablet (25 mg total) by mouth 2 (two) times daily. (pls call to sch 12 month follow up [phone])   nitroGLYCERIN (NITROSTAT) 0.4 MG SL tablet PLACE 1 TABLET UNDER THE TONGUE EVERY 5 MINUTES AS NEEDED. MAY  REPEAT FOR UP TO 3 DOSES. IF NO RELIEF WITH 1ST DOSE, CALL 911.   pantoprazole (PROTONIX) 20 MG tablet Take 1 tablet (20 mg total) by mouth daily.   ranolazine (RANEXA) 1000 MG SR tablet Take 1 tablet (1,000 mg total) by mouth 2 (two) times daily.   rosuvastatin (CRESTOR) 40 MG tablet Take 1 tablet (40 mg total) by mouth daily. (pls call to sch 12 month follow up [phone])   tacrolimus (PROTOPIC) 0.1 % ointment Apply twice daily to affected areas as needed   [DISCONTINUED] influenza vaccine adjuvanted (FLUAD) 0.5 ML injection Inject into the muscle. (Patient not taking: Reported on 04/30/2022)   No facility-administered medications prior to visit.    Review of Systems  Constitutional:  Negative for appetite change, chills and fatigue.  Respiratory:  Negative for chest tightness and shortness of breath.   Cardiovascular:  Negative for chest pain.  Psychiatric/Behavioral:  Positive for agitation, dysphoric mood and sleep disturbance. The patient is nervous/anxious.      Objective    BP 122/78 (BP Location: Left Arm, Patient Position: Sitting, Cuff Size: Large)   Pulse 68   Temp 98.1 F (36.7 C) (Temporal)   Resp 16   Wt 211 lb (95.7 kg)   BMI 34.06 kg/m  BP Readings from Last 3 Encounters:  04/30/22 122/78  01/22/22 130/78  10/26/21 123/75   Wt Readings from Last 3 Encounters:  04/30/22 211 lb (95.7 kg)  01/22/22 207 lb 2 oz (94 kg)  10/26/21 205 lb 9.6 oz (93.3 kg)      Physical Exam Vitals reviewed.  Constitutional:      Appearance: He is not ill-appearing.  HENT:     Head: Normocephalic.  Eyes:     Conjunctiva/sclera: Conjunctivae normal.  Cardiovascular:     Rate and Rhythm: Normal rate.  Pulmonary:     Effort: Pulmonary effort is normal. No respiratory distress.  Neurological:     General: No focal deficit present.     Mental Status: He is alert and oriented to person, place, and time.  Psychiatric:        Mood and Affect: Mood normal.        Behavior:  Behavior normal.      No results found for any visits on 04/30/22.  Assessment & Plan     Problem List Items Addressed This Visit       Other   Anxiety - Primary    Managed with lexapro 20 mg and xanax 0.5 mg 1 tab QHS Advised pt he could try 0.5 tab in early evening and 1 QHS also discussed switching to a longer-acting alternative.  We also had a therapeutic discussion; pt would be a good candidate for therapy. Advised as such  Return in about 6 weeks (around 06/11/2022) for anxiety.      I, Mikey Kirschner, PA-C have reviewed all documentation for this visit. The documentation on  04/30/22  for the exam, diagnosis, procedures, and orders are all accurate and complete.  Mikey Kirschner, PA-C [facility] [address] [place], [place], [address] Office: [phone] Fax: [place]

## 2022-04-30 NOTE — Assessment & Plan Note (Signed)
Managed with lexapro 20 mg and xanax 0.5 mg 1 tab QHS Advised pt he could try 0.5 tab in early evening and 1 QHS also discussed switching to a longer-acting alternative.  We also had a therapeutic discussion; pt would be a good candidate for therapy. Advised as such

## 2022-05-02 ENCOUNTER — Other Ambulatory Visit (HOSPITAL_COMMUNITY): Payer: Self-pay

## 2022-05-03 ENCOUNTER — Other Ambulatory Visit (HOSPITAL_COMMUNITY): Payer: Self-pay

## 2022-05-04 ENCOUNTER — Other Ambulatory Visit (HOSPITAL_COMMUNITY): Payer: Self-pay

## 2022-05-04 ENCOUNTER — Other Ambulatory Visit: Payer: Self-pay

## 2022-05-16 ENCOUNTER — Other Ambulatory Visit: Payer: Self-pay

## 2022-05-21 ENCOUNTER — Other Ambulatory Visit: Payer: Self-pay

## 2022-05-25 DIAGNOSIS — G4733 Obstructive sleep apnea (adult) (pediatric): Secondary | ICD-10-CM | POA: Diagnosis not present

## 2022-06-12 ENCOUNTER — Other Ambulatory Visit: Payer: Self-pay | Admitting: Physician Assistant

## 2022-06-12 ENCOUNTER — Other Ambulatory Visit: Payer: Self-pay | Admitting: Cardiovascular Disease

## 2022-06-12 DIAGNOSIS — F419 Anxiety disorder, unspecified: Secondary | ICD-10-CM

## 2022-06-13 ENCOUNTER — Other Ambulatory Visit (HOSPITAL_COMMUNITY): Payer: Self-pay

## 2022-06-13 MED ORDER — LEXAPRO 20 MG PO TABS
20.0000 mg | ORAL_TABLET | Freq: Every day | ORAL | 3 refills | Status: DC
  Filled 2022-06-13: qty 90, 90d supply, fill #0
  Filled 2022-09-07: qty 30, 30d supply, fill #1
  Filled 2022-09-07: qty 15, 15d supply, fill #1
  Filled 2022-09-28 – 2022-10-05 (×2): qty 90, 90d supply, fill #2
  Filled 2022-12-31: qty 90, 90d supply, fill #3

## 2022-06-13 MED ORDER — ALPRAZOLAM 0.5 MG PO TABS
0.5000 mg | ORAL_TABLET | Freq: Every evening | ORAL | 1 refills | Status: DC | PRN
  Filled 2022-06-13: qty 60, 30d supply, fill #0
  Filled 2022-07-24 – 2022-07-27 (×2): qty 60, 30d supply, fill #1

## 2022-06-15 ENCOUNTER — Other Ambulatory Visit (HOSPITAL_COMMUNITY): Payer: Self-pay

## 2022-06-18 ENCOUNTER — Other Ambulatory Visit: Payer: Self-pay

## 2022-06-23 ENCOUNTER — Other Ambulatory Visit (HOSPITAL_COMMUNITY): Payer: Self-pay

## 2022-06-23 DIAGNOSIS — G4733 Obstructive sleep apnea (adult) (pediatric): Secondary | ICD-10-CM | POA: Diagnosis not present

## 2022-06-25 ENCOUNTER — Telehealth: Payer: Self-pay | Admitting: Cardiovascular Disease

## 2022-06-25 ENCOUNTER — Other Ambulatory Visit (HOSPITAL_COMMUNITY): Payer: Self-pay

## 2022-06-25 MED ORDER — RANOLAZINE ER 1000 MG PO TB12
1000.0000 mg | ORAL_TABLET | Freq: Two times a day (BID) | ORAL | 0 refills | Status: DC
  Filled 2022-06-25 – 2022-06-29 (×3): qty 60, 30d supply, fill #0

## 2022-06-25 NOTE — Telephone Encounter (Signed)
*  STAT* If patient is at the pharmacy, call can be transferred to refill team.   1. Which medications need to be refilled? (please list name of each medication and dose if known)  ranexa, 1000mg   2. Which pharmacy/location (including street and city if local pharmacy) is medication to be sent to? [facility]  3. Do they need a 30 day or 90 day supply? 90 day suppy  Urgent request refill misplaced need [place].

## 2022-06-25 NOTE — Telephone Encounter (Signed)
Requested Prescriptions   Signed Prescriptions Disp Refills   ranolazine (RANEXA) 1000 MG SR tablet 60 tablet 0    Sig: Take 1 tablet (1,000 mg total) by mouth 2 (two) times daily.    Authorizing Provider: Minna Merritts    Ordering User: Raelene Bott, Kerry-Anne Mezo L   Patient overdue for F/U appt. Scheduled with Gerrie Nordmann, NP on 07/30/22. Further refills to be given at office visit.

## 2022-06-28 ENCOUNTER — Other Ambulatory Visit (HOSPITAL_COMMUNITY): Payer: Self-pay

## 2022-06-29 ENCOUNTER — Other Ambulatory Visit (HOSPITAL_COMMUNITY): Payer: Self-pay

## 2022-06-29 ENCOUNTER — Other Ambulatory Visit: Payer: Self-pay

## 2022-07-02 ENCOUNTER — Other Ambulatory Visit (HOSPITAL_COMMUNITY): Payer: Self-pay

## 2022-07-24 ENCOUNTER — Other Ambulatory Visit: Payer: Self-pay | Admitting: Cardiovascular Disease

## 2022-07-24 ENCOUNTER — Other Ambulatory Visit: Payer: Self-pay

## 2022-07-24 ENCOUNTER — Other Ambulatory Visit: Payer: Self-pay | Admitting: Gastroenterology

## 2022-07-24 MED ORDER — RANOLAZINE ER 1000 MG PO TB12
1000.0000 mg | ORAL_TABLET | Freq: Two times a day (BID) | ORAL | 0 refills | Status: DC
  Filled 2022-07-24: qty 60, 30d supply, fill #0

## 2022-07-24 MED ORDER — PANTOPRAZOLE SODIUM 20 MG PO TBEC
20.0000 mg | DELAYED_RELEASE_TABLET | Freq: Every day | ORAL | 0 refills | Status: DC
  Filled 2022-07-24: qty 90, 90d supply, fill #0

## 2022-07-25 ENCOUNTER — Other Ambulatory Visit (HOSPITAL_COMMUNITY): Payer: Self-pay

## 2022-07-27 ENCOUNTER — Other Ambulatory Visit (HOSPITAL_COMMUNITY): Payer: Self-pay

## 2022-07-27 ENCOUNTER — Other Ambulatory Visit: Payer: Self-pay

## 2022-07-30 ENCOUNTER — Ambulatory Visit: Payer: Commercial Managed Care - PPO | Admitting: Cardiology

## 2022-07-30 ENCOUNTER — Other Ambulatory Visit: Payer: Self-pay

## 2022-07-31 ENCOUNTER — Other Ambulatory Visit: Payer: Self-pay

## 2022-08-30 ENCOUNTER — Encounter: Payer: Self-pay | Admitting: Cardiology

## 2022-08-30 ENCOUNTER — Other Ambulatory Visit (HOSPITAL_COMMUNITY): Payer: Self-pay

## 2022-08-30 ENCOUNTER — Ambulatory Visit: Payer: 59 | Attending: Cardiology | Admitting: Cardiology

## 2022-08-30 VITALS — BP 116/66 | HR 65 | Ht 66.0 in | Wt 202.8 lb

## 2022-08-30 DIAGNOSIS — E785 Hyperlipidemia, unspecified: Secondary | ICD-10-CM

## 2022-08-30 DIAGNOSIS — I25110 Atherosclerotic heart disease of native coronary artery with unstable angina pectoris: Secondary | ICD-10-CM | POA: Diagnosis not present

## 2022-08-30 DIAGNOSIS — I480 Paroxysmal atrial fibrillation: Secondary | ICD-10-CM | POA: Diagnosis not present

## 2022-08-30 DIAGNOSIS — I639 Cerebral infarction, unspecified: Secondary | ICD-10-CM | POA: Diagnosis not present

## 2022-08-30 DIAGNOSIS — I10 Essential (primary) hypertension: Secondary | ICD-10-CM | POA: Diagnosis not present

## 2022-08-30 MED ORDER — EZETIMIBE 10 MG PO TABS
10.0000 mg | ORAL_TABLET | Freq: Every day | ORAL | 3 refills | Status: DC
  Filled 2022-08-30 – 2022-10-20 (×2): qty 90, 90d supply, fill #0
  Filled 2023-01-22: qty 90, 90d supply, fill #1
  Filled 2023-04-14: qty 90, 90d supply, fill #2
  Filled 2023-07-27: qty 90, 90d supply, fill #3

## 2022-08-30 MED ORDER — METOPROLOL TARTRATE 25 MG PO TABS
25.0000 mg | ORAL_TABLET | Freq: Two times a day (BID) | ORAL | 3 refills | Status: DC
  Filled 2022-08-30 – 2022-08-31 (×2): qty 180, 90d supply, fill #0
  Filled 2022-11-20: qty 180, 90d supply, fill #1
  Filled 2023-02-23: qty 180, 90d supply, fill #2
  Filled 2023-04-14 – 2023-05-23 (×2): qty 180, 90d supply, fill #3

## 2022-08-30 MED ORDER — ROSUVASTATIN CALCIUM 40 MG PO TABS
40.0000 mg | ORAL_TABLET | Freq: Every day | ORAL | 3 refills | Status: DC
  Filled 2022-08-30 – 2022-10-20 (×2): qty 90, 90d supply, fill #0
  Filled 2023-01-22: qty 90, 90d supply, fill #1
  Filled 2023-04-14: qty 90, 90d supply, fill #2
  Filled 2023-05-01 – 2023-07-27 (×2): qty 90, 90d supply, fill #3

## 2022-08-30 MED ORDER — ISOSORBIDE MONONITRATE ER 30 MG PO TB24
30.0000 mg | ORAL_TABLET | Freq: Every day | ORAL | 3 refills | Status: DC
  Filled 2022-08-30 – 2022-10-20 (×2): qty 90, 90d supply, fill #0
  Filled 2023-01-22: qty 90, 90d supply, fill #1
  Filled 2023-04-14: qty 90, 90d supply, fill #2
  Filled 2023-05-01 – 2023-07-27 (×2): qty 90, 90d supply, fill #3

## 2022-08-30 MED ORDER — CLOPIDOGREL BISULFATE 75 MG PO TABS
75.0000 mg | ORAL_TABLET | Freq: Every day | ORAL | 2 refills | Status: DC
  Filled 2022-08-30 – 2022-09-28 (×2): qty 90, 90d supply, fill #0
  Filled 2022-12-31: qty 90, 90d supply, fill #1
  Filled 2023-03-31: qty 90, 90d supply, fill #2

## 2022-08-30 MED ORDER — RANOLAZINE ER 1000 MG PO TB12
1000.0000 mg | ORAL_TABLET | Freq: Two times a day (BID) | ORAL | 3 refills | Status: DC
  Filled 2022-08-30 – 2022-08-31 (×2): qty 180, 90d supply, fill #0
  Filled 2022-11-20: qty 180, 90d supply, fill #1
  Filled 2023-02-23: qty 180, 90d supply, fill #2
  Filled 2023-05-23: qty 180, 90d supply, fill #3

## 2022-08-30 NOTE — Progress Notes (Signed)
Cardiology Office Note:   Date:  08/30/2022  ID:  Kevin [name], DOB Aug 07, 1955, MRN [id number] PCP: Alfredia Ferguson, PA-C  [facility] Providers Cardiologist:  Julien Nordmann, MD    History of Present Illness:   Kevin [name] is a 67 y.o. 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  Left heart catheterization in 8/18 after exertional dyspnea and chest pain revealed left ventricular ejection fraction 55 to 65%.,  Mid LAD lesion 30% stenosis, proximal LAD lesion 40%.  Second second-patient with significant stenosis, SVG was normal in caliber, With minimal luminal irregularities, SVG "present stenosis.  Significant underlying one-vessel coronary artery disease with patent SVG to second diagonal, patent LAD stent with in-stent restenosis noted occluded SVG to OM.  Native RCA and left circumflex did not have any obstructive disease.  Normal LV systolic function mildly elevated left ventricular end-diastolic pressures.  Myoview completed in 2019 and was considered a low risk scan.  Small region of mild fixed perfusion deficit distal inferior lateral wall, apical inferior lateral consistent with previous MI/scar.  Lexiscan Myoview completed in 2023 with an LVEF estimated 63%, no EKG changes concerning for ischemia at peak stress or recovery, no significant ischemia, considered low risk.  He was last seen in clinic 05/02/2021 by Dr. Mariah Milling.  He had complaints of shortness of breath and tightness at the present time for continued hypoxic, and syncope about 1 month ago after the areas dialysis.  At that time he was scheduled.  Last nuclear stress testing revealed low risk.  There were no medication changes that were made.  He returns to clinic today  accompanied by his wife.  She stated that when Ranexa was changed from training to generic he did have some issues with chest discomfort.  Unfortunately with his previous CVA and his memory issues he does not recall complaining of chest pain or short of breath.  Wife is with him and states that he has not had any complaints since being on the medication as of late.  He has been compliant with his medications.  Denies any hospitalizations or visits to the emergency department.  ROS: 10 point review of systems has been reviewed and considered negative with exception of what is listed in HPI.  Studies Reviewed:    EKG: Sinus rhythm with a rate of 65, RSR prime in V1 and V2, left axis deviation  Lexiscan MPI 09/2021 Pharmacological myocardial perfusion imaging study with no significant  ischemia Normal wall motion, EF estimated at 63% No EKG changes concerning for ischemia at peak stress or in recovery. Low risk scan  Risk Assessment/Calculations:                  Physical Exam:   VS:  BP 116/66 (BP Location: Left Arm, Patient Position: Sitting, Cuff Size: Normal)   Pulse 65   Ht 5\' 6"  (1.676 m)   Wt 202 lb 12.8 oz (92 kg)   SpO2 98%   BMI 32.73 kg/m    Wt Readings from Last 3 Encounters:  08/30/22 202 lb 12.8 oz (92 kg)  04/30/22 211 lb (95.7 kg)  01/22/22 207 lb 2 oz (94 kg)     GEN: Well nourished, well developed in no acute distress NECK: No JVD; No carotid bruits CARDIAC: RRR, no murmurs, rubs, gallops  RESPIRATORY:  Clear to auscultation without rales, wheezing or rhonchi  ABDOMEN: Soft, non-tender, non-distended EXTREMITIES:  No edema; No deformity   ASSESSMENT AND PLAN:   Coronary artery disease of native coronary arteries with stable angina. Denies any recurrent chest discomfort after getting used to be being on generic Ranexa.  Has not required the use of any sublingual nitro.  Lexiscan Myoview completed in 09/2021 was considered a low risk scan.  He is continued on  aspirin, clopidogrel, Zetia, Imdur, rosuvastatin. EKG today without any ischemic changes noted today. Routine CBC and BMP ordered today with last last 2023.  Hypertension with blood pressure today 116/66. Blood pressure remains stable. Remains on metoprolol tartrate 25 mg twice daily, Imdur and Renaxa. Encouraged to continue to monitor blood pressures at home.  Mixed hyperlipidemia continued on Zetia and rosuvastatin. Lipid panel ordered today.  Paroxysmal atrial fibrillation s/p Maze procedure. Maintaining normal sinus rhythm. Continued on beta blocker therapy,  Patent foramen ovale status post previous PFO closure.  No further testing is indicated.  Previous CVA with residual deficits of memory loss.  He is continued on aspirin and clopidogrel as well as statin therapy.  Disposition patient return to clinic to see MD/APP in 11 to 12 months or sooner if needed with an EKG on return.        Signed, Ercilia Bettinger, NP

## 2022-08-30 NOTE — Patient Instructions (Signed)
Medication Instructions:   Your physician recommends that you continue on your current medications as directed. Please refer to the Current Medication list given to you today.  *If you need a refill on your cardiac medications before your next appointment, please call your pharmacy*   Lab Work:  Your physician recommends that you return for lab work next week.   - Please go to the [facility]. You will check in at the front desk to the right as you walk into the atrium. Valet Parking is offered if needed. - No appointment needed. You may go any day between 7 am and 6 pm.  CBC/LIPID/BMP  - You will need to be fasting. Please do not have anything to eat or drink after midnight the morning you have the lab work. You may only have water or black coffee with no cream or sugar.   If you have labs (blood work) drawn today and your tests are completely normal, you will receive your results only by: MyChart Message (if you have MyChart) OR A paper copy in the mail If you have any lab test that is abnormal or we need to change your treatment, we will call you to review the results.   Testing/Procedures:  No testing ordered today.   Follow-Up: At [facility], you and your health needs are our priority.  As part of our continuing mission to provide you with exceptional heart care, we have created designated Provider Care Teams.  These Care Teams include your primary Cardiologist (physician) and Advanced Practice Providers (APPs -  Physician Assistants and Nurse Practitioners) who all work together to provide you with the care you need, when you need it.  We recommend signing up for the patient portal called "MyChart".  Sign up information is provided on this After Visit Summary.  MyChart is used to connect with patients for Virtual Visits (Telemedicine).  Patients are able to view lab/test results, encounter notes, upcoming appointments, etc.  Non-urgent messages can be sent to  your provider as well.   To learn more about what you can do with MyChart, go to [email or web address].    Your next appointment:   11-12 month(s)  Provider:   You may see Julien Nordmann, MD or one of the following Advanced Practice Providers on your designated Care Team:   Nicolasa Ducking, NP Eula Listen, PA-C Cadence Fransico Michael, PA-C Charlsie Quest, NP

## 2022-08-31 ENCOUNTER — Other Ambulatory Visit: Payer: Self-pay

## 2022-08-31 ENCOUNTER — Other Ambulatory Visit (HOSPITAL_COMMUNITY): Payer: Self-pay

## 2022-09-01 ENCOUNTER — Other Ambulatory Visit (HOSPITAL_COMMUNITY): Payer: Self-pay

## 2022-09-04 ENCOUNTER — Other Ambulatory Visit (HOSPITAL_BASED_OUTPATIENT_CLINIC_OR_DEPARTMENT_OTHER): Payer: Self-pay

## 2022-09-07 ENCOUNTER — Encounter (HOSPITAL_COMMUNITY): Payer: Self-pay

## 2022-09-07 ENCOUNTER — Other Ambulatory Visit (HOSPITAL_COMMUNITY): Payer: Self-pay

## 2022-09-08 ENCOUNTER — Other Ambulatory Visit (HOSPITAL_COMMUNITY): Payer: Self-pay

## 2022-09-10 ENCOUNTER — Other Ambulatory Visit (HOSPITAL_COMMUNITY): Payer: Self-pay

## 2022-09-10 ENCOUNTER — Other Ambulatory Visit: Payer: Self-pay

## 2022-09-18 DIAGNOSIS — G4733 Obstructive sleep apnea (adult) (pediatric): Secondary | ICD-10-CM | POA: Diagnosis not present

## 2022-09-22 ENCOUNTER — Other Ambulatory Visit
Admission: RE | Admit: 2022-09-22 | Discharge: 2022-09-22 | Disposition: A | Discharge status: Home / Self Care | Payer: 59 | Source: Ambulatory Visit | Attending: Cardiovascular Disease | Admitting: Cardiovascular Disease

## 2022-09-22 DIAGNOSIS — I480 Paroxysmal atrial fibrillation: Secondary | ICD-10-CM | POA: Diagnosis not present

## 2022-09-22 DIAGNOSIS — I639 Cerebral infarction, unspecified: Secondary | ICD-10-CM | POA: Diagnosis not present

## 2022-09-22 DIAGNOSIS — E785 Hyperlipidemia, unspecified: Secondary | ICD-10-CM | POA: Diagnosis not present

## 2022-09-22 DIAGNOSIS — I10 Essential (primary) hypertension: Secondary | ICD-10-CM | POA: Insufficient documentation

## 2022-09-22 DIAGNOSIS — I25110 Atherosclerotic heart disease of native coronary artery with unstable angina pectoris: Secondary | ICD-10-CM | POA: Insufficient documentation

## 2022-09-22 LAB — LIPID PANEL
Cholesterol: 112 mg/dL (ref 0–200)
HDL: 53 mg/dL (ref >40)
LDL Cholesterol: 47 mg/dL (ref 0–99)
Total CHOL/HDL Ratio: 2.1 RATIO
Triglycerides: 62 mg/dL (ref <150)
VLDL: 12 mg/dL (ref 0–40)

## 2022-09-22 LAB — BASIC METABOLIC PANEL
Anion gap: 9 (ref 5–15)
BUN: 24 mg/dL — ABNORMAL HIGH (ref 8–23)
CO2: 24 mmol/L (ref 22–32)
Calcium: 8.9 mg/dL (ref 8.9–10.3)
Chloride: 107 mmol/L (ref 98–111)
Creatinine, Ser: 1.44 mg/dL — ABNORMAL HIGH (ref 0.61–1.24)
GFR, Estimated: 53 mL/min — ABNORMAL LOW (ref >60)
Glucose, Bld: 115 mg/dL — ABNORMAL HIGH (ref 70–99)
Potassium: 4.4 mmol/L (ref 3.5–5.1)
Sodium: 140 mmol/L (ref 135–145)

## 2022-09-22 LAB — CBC
HCT: 37.4 % — ABNORMAL LOW (ref 39.0–52.0)
Hemoglobin: 12.8 g/dL — ABNORMAL LOW (ref 13.0–17.0)
MCH: 32.2 pg (ref 26.0–34.0)
MCHC: 34.2 g/dL (ref 30.0–36.0)
MCV: 94 fL (ref 80.0–100.0)
Platelets: 175 10*3/uL (ref 150–400)
RBC: 3.98 MIL/uL — ABNORMAL LOW (ref 4.22–5.81)
RDW: 12.2 % (ref 11.5–15.5)
WBC: 6.3 10*3/uL (ref 4.0–10.5)
nRBC: 0 % (ref 0.0–0.2)

## 2022-09-23 NOTE — Progress Notes (Signed)
Blood counts have remained stable. Cholesterol is still at goal. Kidney function is slightly worsened. Stay hydrated with increasing water intake. Repeat BMP in 4 weeks.

## 2022-09-24 ENCOUNTER — Other Ambulatory Visit: Payer: Self-pay

## 2022-09-24 DIAGNOSIS — I25110 Atherosclerotic heart disease of native coronary artery with unstable angina pectoris: Secondary | ICD-10-CM

## 2022-09-28 ENCOUNTER — Other Ambulatory Visit (HOSPITAL_COMMUNITY): Payer: Self-pay

## 2022-09-28 ENCOUNTER — Other Ambulatory Visit: Payer: Self-pay

## 2022-09-28 ENCOUNTER — Other Ambulatory Visit (HOSPITAL_BASED_OUTPATIENT_CLINIC_OR_DEPARTMENT_OTHER): Payer: Self-pay

## 2022-09-28 ENCOUNTER — Other Ambulatory Visit: Payer: Self-pay | Admitting: Physician Assistant

## 2022-09-28 DIAGNOSIS — F419 Anxiety disorder, unspecified: Secondary | ICD-10-CM

## 2022-09-28 MED ORDER — ALPRAZOLAM 0.5 MG PO TABS
0.5000 mg | ORAL_TABLET | Freq: Every evening | ORAL | 1 refills | Status: DC | PRN
Start: 2022-09-28 — End: 2023-01-24
  Filled 2022-09-28: qty 60, 30d supply, fill #0
  Filled 2022-11-20: qty 60, 30d supply, fill #1

## 2022-10-01 ENCOUNTER — Other Ambulatory Visit: Payer: Self-pay

## 2022-10-02 ENCOUNTER — Encounter: Payer: Self-pay | Admitting: Pharmacist

## 2022-10-02 ENCOUNTER — Other Ambulatory Visit: Payer: Self-pay

## 2022-10-03 ENCOUNTER — Other Ambulatory Visit: Payer: Self-pay

## 2022-10-05 ENCOUNTER — Other Ambulatory Visit (HOSPITAL_COMMUNITY): Payer: Self-pay

## 2022-10-05 ENCOUNTER — Other Ambulatory Visit: Payer: Self-pay

## 2022-10-18 DIAGNOSIS — G4733 Obstructive sleep apnea (adult) (pediatric): Secondary | ICD-10-CM | POA: Diagnosis not present

## 2022-10-20 ENCOUNTER — Other Ambulatory Visit: Payer: Self-pay | Admitting: Gastroenterology

## 2022-10-22 ENCOUNTER — Other Ambulatory Visit (HOSPITAL_COMMUNITY): Payer: Self-pay

## 2022-10-22 ENCOUNTER — Other Ambulatory Visit: Payer: Self-pay

## 2022-10-22 MED ORDER — PANTOPRAZOLE SODIUM 20 MG PO TBEC
20.0000 mg | DELAYED_RELEASE_TABLET | Freq: Every day | ORAL | 0 refills | Status: DC
  Filled 2022-10-22: qty 90, 90d supply, fill #0

## 2022-11-18 DIAGNOSIS — G4733 Obstructive sleep apnea (adult) (pediatric): Secondary | ICD-10-CM | POA: Diagnosis not present

## 2022-11-20 ENCOUNTER — Other Ambulatory Visit (HOSPITAL_COMMUNITY): Payer: Self-pay

## 2022-11-21 ENCOUNTER — Other Ambulatory Visit: Payer: Self-pay

## 2022-12-17 DIAGNOSIS — G4733 Obstructive sleep apnea (adult) (pediatric): Secondary | ICD-10-CM | POA: Diagnosis not present

## 2022-12-31 ENCOUNTER — Other Ambulatory Visit (HOSPITAL_COMMUNITY): Payer: Self-pay

## 2022-12-31 ENCOUNTER — Other Ambulatory Visit: Payer: Self-pay | Admitting: Physician Assistant

## 2022-12-31 ENCOUNTER — Other Ambulatory Visit: Payer: Self-pay

## 2022-12-31 DIAGNOSIS — F419 Anxiety disorder, unspecified: Secondary | ICD-10-CM

## 2023-01-01 ENCOUNTER — Other Ambulatory Visit: Payer: Self-pay

## 2023-01-02 ENCOUNTER — Other Ambulatory Visit: Payer: Self-pay

## 2023-01-16 DIAGNOSIS — G4733 Obstructive sleep apnea (adult) (pediatric): Secondary | ICD-10-CM | POA: Diagnosis not present

## 2023-01-18 ENCOUNTER — Other Ambulatory Visit (HOSPITAL_COMMUNITY): Payer: Self-pay

## 2023-01-18 ENCOUNTER — Encounter (HOSPITAL_COMMUNITY): Payer: Self-pay

## 2023-01-22 ENCOUNTER — Other Ambulatory Visit (HOSPITAL_COMMUNITY): Payer: Self-pay

## 2023-01-22 ENCOUNTER — Other Ambulatory Visit: Payer: Self-pay | Admitting: Gastroenterology

## 2023-01-23 ENCOUNTER — Other Ambulatory Visit: Payer: Self-pay

## 2023-01-23 NOTE — Progress Notes (Unsigned)
Established patient visit  Patient: Kevin [name]   DOB: [birth year]   67 y.o. Male  MRN: [id number] Visit Date: 01/24/2023  Today's healthcare provider: Debera Lat, PA-C   No chief complaint on file.  Subjective    HPI  *** Discussed the use of AI scribe software for clinical note transcription with the patient, who gave verbal consent to proceed.  History of Present Illness               04/30/2022    2:04 PM 01/31/2021    1:55 PM 10/24/2020    1:33 PM  Depression screen PHQ 2/9  Decreased Interest 0 0 0  Down, Depressed, Hopeless 0 0 0  PHQ - 2 Score 0 0 0  Altered sleeping 2 0 0  Tired, decreased energy 0 0 0  Change in appetite 2 0 0  Feeling bad or failure about yourself  0 0 0  Trouble concentrating 0 0 0  Moving slowly or fidgety/restless 0 0 0  Suicidal thoughts 0 0 0  PHQ-9 Score 4 0 0  Difficult doing work/chores Somewhat difficult Not difficult at all Not difficult at all      04/30/2022    2:06 PM 04/04/2021    3:11 PM  GAD 7 : Generalized Anxiety Score  Nervous, Anxious, on Edge 2 0  Control/stop worrying 3 0  Worry too much - different things 0 3  Trouble relaxing 3 0  Restless 0 0  Easily annoyed or irritable 3 0  Afraid - awful might happen 0 0  Total GAD 7 Score 11 3  Anxiety Difficulty Somewhat difficult Not difficult at all    Medications: Outpatient Medications Prior to Visit  Medication Sig   albuterol (VENTOLIN HFA) 108 (90 Base) MCG/ACT inhaler Inhale 2 puffs into the lungs every 6 (six) hours as needed for wheezing or shortness of breath.   ALPRAZolam (XANAX) 0.5 MG tablet Take 1-2 tablets (0.5-1 mg total) by mouth at bedtime as needed for anxiety or sleep.   ARTHRITIS PAIN RELIEF 650 MG CR tablet Take 650 mg by mouth every 8 (eight) hours as needed.   aspirin EC 81 MG tablet Take 81 mg by mouth daily.   cetirizine (ZYRTEC) 10 MG tablet Take 10 mg by mouth at bedtime.   clobetasol ointment (TEMOVATE) 0.05 % Apply 1 Application  topically 2 (two) times daily until smooth, then stop and use as needed.   clopidogrel (PLAVIX) 75 MG tablet Take 1 tablet (75 mg total) by mouth daily.   COVID-19 mRNA bivalent vaccine, Pfizer, (PFIZER COVID-19 VAC BIVALENT) injection Inject into the muscle. (Patient not taking: Reported on 08/30/2022)   COVID-19 mRNA vaccine 2023-2024 (COMIRNATY) syringe Inject into the muscle. (Patient not taking: Reported on 08/30/2022)   desonide (DESOWEN) 0.05 % cream Apply 1 application topically 2 (two) times daily as needed (for psoriasis).   desonide (DESOWEN) 0.05 % cream Apply 1 Application topically 2 (two) times daily to affected areas on face and neck when flared.   ezetimibe (ZETIA) 10 MG tablet Take 1 tablet (10 mg total) by mouth daily. (pls call to sch 12 month follow up [phone])   isosorbide mononitrate (IMDUR) 30 MG 24 hr tablet Take 1 tablet (30 mg total) by mouth daily.   ketoconazole (NIZORAL) 2 % shampoo Apply 1 Application topically to affected area (leave on for 5 minutes) , rinse well, use 2-3 times per week.   LEXAPRO 20 MG tablet Take 1 tablet (20 mg total)  by mouth daily.   metoprolol tartrate (LOPRESSOR) 25 MG tablet Take 1 tablet (25 mg total) by mouth 2 (two) times daily.   nitroGLYCERIN (NITROSTAT) 0.4 MG SL tablet PLACE 1 TABLET UNDER THE TONGUE EVERY 5 MINUTES AS NEEDED. MAY REPEAT FOR UP TO 3 DOSES. IF NO RELIEF WITH 1ST DOSE, CALL 911.   pantoprazole (PROTONIX) 20 MG tablet Take 1 tablet (20 mg total) by mouth daily.   ranolazine (RANEXA) 1000 MG SR tablet Take 1 tablet (1,000 mg total) by mouth 2 (two) times daily.   rosuvastatin (CRESTOR) 40 MG tablet Take 1 tablet (40 mg total) by mouth daily.   tacrolimus (PROTOPIC) 0.1 % ointment Apply twice daily to affected areas as needed   No facility-administered medications prior to visit.    Review of Systems Except see HPI   {Insert previous labs (optional):23779} {See past labs  Heme  Chem  Endocrine  Serology   Results Review (optional):1}   Objective    There were no vitals taken for this visit. {Insert last BP/Wt (optional):23777}{See vitals history (optional):1}   Physical Exam   No results found for any visits on 01/24/23.  Assessment & Plan    *** Assessment and Plan              No follow-ups on file.      [facility]

## 2023-01-24 ENCOUNTER — Other Ambulatory Visit: Payer: Self-pay

## 2023-01-24 ENCOUNTER — Encounter: Payer: Self-pay | Admitting: Physician Assistant

## 2023-01-24 ENCOUNTER — Other Ambulatory Visit (HOSPITAL_COMMUNITY): Payer: Self-pay

## 2023-01-24 ENCOUNTER — Ambulatory Visit: Payer: 59 | Admitting: Physician Assistant

## 2023-01-24 VITALS — BP 114/87 | HR 58 | Ht 66.0 in | Wt 204.0 lb

## 2023-01-24 DIAGNOSIS — F419 Anxiety disorder, unspecified: Secondary | ICD-10-CM | POA: Diagnosis not present

## 2023-01-24 DIAGNOSIS — Z23 Encounter for immunization: Secondary | ICD-10-CM | POA: Diagnosis not present

## 2023-01-24 DIAGNOSIS — E66811 Obesity, class 1: Secondary | ICD-10-CM

## 2023-01-24 DIAGNOSIS — Z8673 Personal history of transient ischemic attack (TIA), and cerebral infarction without residual deficits: Secondary | ICD-10-CM

## 2023-01-24 MED ORDER — ALPRAZOLAM 0.5 MG PO TABS
0.5000 mg | ORAL_TABLET | Freq: Every day | ORAL | 1 refills | Status: DC
Start: 2023-01-24 — End: 2023-05-01
  Filled 2023-01-24: qty 30, 30d supply, fill #0
  Filled 2023-02-23: qty 30, 30d supply, fill #1

## 2023-01-24 MED ORDER — LEXAPRO 20 MG PO TABS
20.0000 mg | ORAL_TABLET | Freq: Every day | ORAL | 3 refills | Status: DC
Start: 2023-01-24 — End: 2023-11-15
  Filled 2023-01-24 – 2023-03-31 (×2): qty 90, 90d supply, fill #0
  Filled 2023-06-29: qty 90, 90d supply, fill #1
  Filled 2023-09-02 – 2023-09-14 (×2): qty 30, 30d supply, fill #2
  Filled 2023-10-18 – 2023-11-02 (×4): qty 30, 30d supply, fill #3
  Filled 2023-11-15: qty 30, 30d supply, fill #4

## 2023-01-25 LAB — CBC WITH DIFFERENTIAL/PLATELET
Basophils Absolute: 0.1 10*3/uL (ref 0.0–0.2)
Basos: 1 %
EOS (ABSOLUTE): 0.3 10*3/uL (ref 0.0–0.4)
Eos: 5 %
Hematocrit: 40.6 % (ref 37.5–51.0)
Hemoglobin: 12.9 g/dL — ABNORMAL LOW (ref 13.0–17.7)
Immature Grans (Abs): 0 10*3/uL (ref 0.0–0.1)
Immature Granulocytes: 1 %
Lymphocytes Absolute: 1.3 10*3/uL (ref 0.7–3.1)
Lymphs: 20 %
MCH: 31.6 pg (ref 26.6–33.0)
MCHC: 31.8 g/dL (ref 31.5–35.7)
MCV: 100 fL — ABNORMAL HIGH (ref 79–97)
Monocytes Absolute: 0.6 10*3/uL (ref 0.1–0.9)
Monocytes: 9 %
Neutrophils Absolute: 4.2 10*3/uL (ref 1.4–7.0)
Neutrophils: 64 %
Platelets: 186 10*3/uL (ref 150–450)
RBC: 4.08 x10E6/uL — ABNORMAL LOW (ref 4.14–5.80)
RDW: 11.9 % (ref 11.6–15.4)
WBC: 6.5 10*3/uL (ref 3.4–10.8)

## 2023-01-25 LAB — LIPID PANEL
Chol/HDL Ratio: 1.9 ratio (ref 0.0–5.0)
Cholesterol, Total: 114 mg/dL (ref 100–199)
HDL: 61 mg/dL (ref >39)
LDL Chol Calc (NIH): 40 mg/dL (ref 0–99)
Triglycerides: 57 mg/dL (ref 0–149)
VLDL Cholesterol Cal: 13 mg/dL (ref 5–40)

## 2023-01-25 LAB — COMPREHENSIVE METABOLIC PANEL
ALT: 12 [IU]/L (ref 0–44)
AST: 16 [IU]/L (ref 0–40)
Albumin: 4.5 g/dL (ref 3.9–4.9)
Alkaline Phosphatase: 43 [IU]/L — ABNORMAL LOW (ref 44–121)
BUN/Creatinine Ratio: 16 (ref 10–24)
BUN: 21 mg/dL (ref 8–27)
Bilirubin Total: 0.6 mg/dL (ref 0.0–1.2)
CO2: 25 mmol/L (ref 20–29)
Calcium: 9.4 mg/dL (ref 8.6–10.2)
Chloride: 104 mmol/L (ref 96–106)
Creatinine, Ser: 1.34 mg/dL — ABNORMAL HIGH (ref 0.76–1.27)
Globulin, Total: 2.1 g/dL (ref 1.5–4.5)
Glucose: 110 mg/dL — ABNORMAL HIGH (ref 70–99)
Potassium: 4.7 mmol/L (ref 3.5–5.2)
Sodium: 141 mmol/L (ref 134–144)
Total Protein: 6.6 g/dL (ref 6.0–8.5)
eGFR: 58 mL/min/{1.73_m2} — ABNORMAL LOW (ref >59)

## 2023-01-28 ENCOUNTER — Other Ambulatory Visit: Payer: Self-pay

## 2023-02-07 DIAGNOSIS — H04123 Dry eye syndrome of bilateral lacrimal glands: Secondary | ICD-10-CM | POA: Diagnosis not present

## 2023-02-07 DIAGNOSIS — H2513 Age-related nuclear cataract, bilateral: Secondary | ICD-10-CM | POA: Diagnosis not present

## 2023-02-07 DIAGNOSIS — H53462 Homonymous bilateral field defects, left side: Secondary | ICD-10-CM | POA: Diagnosis not present

## 2023-02-07 DIAGNOSIS — H43813 Vitreous degeneration, bilateral: Secondary | ICD-10-CM | POA: Diagnosis not present

## 2023-02-16 DIAGNOSIS — G4733 Obstructive sleep apnea (adult) (pediatric): Secondary | ICD-10-CM | POA: Diagnosis not present

## 2023-02-19 ENCOUNTER — Other Ambulatory Visit (HOSPITAL_COMMUNITY): Payer: Self-pay

## 2023-02-23 ENCOUNTER — Other Ambulatory Visit: Payer: Self-pay | Admitting: Gastroenterology

## 2023-02-23 ENCOUNTER — Other Ambulatory Visit (HOSPITAL_COMMUNITY): Payer: Self-pay

## 2023-02-25 ENCOUNTER — Other Ambulatory Visit: Payer: Self-pay

## 2023-02-25 ENCOUNTER — Ambulatory Visit: Payer: 59 | Admitting: Physician Assistant

## 2023-02-26 ENCOUNTER — Other Ambulatory Visit (HOSPITAL_COMMUNITY): Payer: Self-pay

## 2023-02-28 NOTE — Progress Notes (Unsigned)
Established patient  Patient: Kevin [name]   DOB: 08/13/1955   67 y.o. Male  MRN: [id number] Visit Date: 03/04/2023  Today's healthcare provider: Debera Lat, PA-C   Chief Complaint  Patient presents with   Medical Management of Chronic Issues    5 week follow-up. Patient last seen on 01/24/23. Taking medications as prescribed. Wife present during visit   Immunizations    Patient declined tetanus vaccine today.    Subjective    Kevin [name] is a 67 y.o. male who presents today for a complete physical exam.   Discussed the use of AI scribe software for clinical note transcription with the patient, who gave verbal consent to proceed.  History of Present Illness   The patient, with a history of stroke, coronary artery disease, and anxiety, presents for a routine follow-up. The patient's wife reports that he has been stable with no new symptoms. He continues to take Plavix and aspirin for stroke prevention, and Lexapro and Xanax for anxiety. The patient's wife expresses concern about the long-term use of Xanax due to potential cognitive decline. The patient also has a history of sleep apnea and uses a CPAP machine. The patient's wife reports that he has been compliant with his CPAP use and has not had any issues. The patient's wife also mentions that the patient has a history of chronic angina, which presents as tightness and pressure in the upper chest and throat area.        03/04/2023    8:45 AM 04/30/2022    2:06 PM 04/04/2021    3:11 PM  GAD 7 : Generalized Anxiety Score  Nervous, Anxious, on Edge 0 2 0  Control/stop worrying 0 3 0  Worry too much - different things 0 0 3  Trouble relaxing 0 3 0  Restless 0 0 0  Easily annoyed or irritable 0 3 0  Afraid - awful might happen 0 0 0  Total GAD 7 Score 0 11 3  Anxiety Difficulty Not difficult at all Somewhat difficult Not difficult at all       Last depression screening scores    03/04/2023    8:45 AM 04/30/2022    2:04  PM 01/31/2021    1:55 PM  PHQ 2/9 Scores  PHQ - 2 Score 0 0 0  PHQ- 9 Score  4 0   Last fall risk screening    03/04/2023    8:45 AM  Fall Risk   Falls in the past year? 0  Number falls in past yr: 0  Injury with Fall? 0  Risk for fall due to : No Fall Risks  Follow up Falls evaluation completed   Last Audit-C alcohol use screening    03/03/2023   12:37 PM  Alcohol Use Disorder Test (AUDIT)  1. How often do you have a drink containing alcohol? 0   A score of 3 or more in women, and 4 or more in men indicates increased risk for alcohol abuse, EXCEPT if all of the points are from question 1   Past Medical History:  Diagnosis Date   Allergy    Since child   Asthma Childhood   Brachial plexopathy    CKD (chronic kidney disease), stage II - III (HCC)    Coronary artery disease    a. s/p 4 vessel CABG in 05/2008 w/ LIMA-LAD, SVG-Diag, sequential SVG-OM1/OM2; b. s/p PCI/DES x 2 to LAD in 10/2009; c. LHC 03/2013 stable disease and patent LAD  stents; d. 10/2016 Cath: LM min irregs, LAD 20p, 21m ISR, D2 80ost, LCX small, min irregs, OM1/2/3 min irregs, RCA/RPDA/RPAV min irregs, RPL1/2 nl RPL3 min irregs, VG->D2 nl, VG->OM1->OM2 100, EF 55-65%.   Depression 1988   H/O maze procedure 05/17/2008   a. 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    Hypertension    Memory deficit after cerebral infarction    OSA (obstructive sleep apnea)    Overweight(278.02)    PAF (paroxysmal atrial fibrillation) (HCC)    a. s/p Maze 05/2008, previously on Eliquis->discontinued 05/2016; c. CHADS2VASc => 4 (HTN, stroke x 2, vascular disease)   Patent foramen ovale    a. 05/2008 s/p closure @ time of CABG.   Psoriatic arthritis (HCC)    Skin cancer    Sleep apnea    Stroke [facility])    a. 07/2005 right brain; b. 05/2005 left brain; c. Resultant memory deficits.   Thrombocytopenia [facility])    Past Surgical History:  Procedure Laterality Date    ACUTE PANCREATITIS  5/11   APPENDECTOMY  1984   CARDIAC CATHETERIZATION     CHOLECYSTECTOMY  08/19/2009   COLONOSCOPY WITH PROPOFOL N/A 06/07/2017   Procedure: COLONOSCOPY WITH PROPOFOL;  Surgeon: Pasty Spillers, MD;  Location: [facility] ENDOSCOPY;  Service: Endoscopy;  Laterality: N/A;   CORONARY ARTERY BYPASS GRAFT  05/12/2008   x4 by PeterVan Trigt,MD   ESOPHAGOGASTRODUODENOSCOPY N/A 03/03/2020   Procedure: ESOPHAGOGASTRODUODENOSCOPY (EGD);  Surgeon: Pasty Spillers, MD;  Location: [facility] ENDOSCOPY;  Service: Endoscopy;  Laterality: N/A;   LEFT HEART CATH AND CORONARY ANGIOGRAPHY N/A 11/29/2016   Procedure: LEFT HEART CATH AND CORONARY ANGIOGRAPHY;  Surgeon: Iran Ouch, MD;  Location: [facility] INVASIVE CV LAB;  Service: Cardiovascular;  Laterality: N/A;   PATENT FORAMEN OVALE CLOSURE     TONSILLECTOMY     AS A CHILD   Social History   Socioeconomic History   Marital status: Married    Spouse name: Lynnae Sandhoff   Number of children: 3   Years of education: Not on file   Highest education level: Some college, no degree  Occupational History   Occupation: Full time    Employer: [employer]  Tobacco Use   Smoking status: Never   Smokeless tobacco: Never  Vaping Use   Vaping status: Never Used  Substance and Sexual Activity   Alcohol use: Yes    Comment: occassionally   Drug use: No   Sexual activity: Yes    Birth control/protection: None  Other Topics Concern   Not on file  Social History Narrative   Married Naval architect) ,Scientist, 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 He has worked at his present job for 22 years. He has been married for 34 years. They have 3 children. 2 of his daughters live in [place]. He drinks less than two cups of caffeine per day. He does not use  tobacco or recreational drugs. He has rare alcohol intake. Lives with wife and daughter      OSA diagnosed at [facility] ,  had his  sleep studies there  reviewed them all in detail today he  has retrognathia, sinusitis,  rhinitis      His ESS remains very high  at 16 and FSS at 15 . His falls assessment tool score is 5.   nasonex,  refitted mask, the recent  HST failed to document that  apnea is present at all. education about REM BD and EDS, narcolepsy , cataplexy.  45 minutes.    Social Determinants of Health   Financial Resource Strain: Low Risk  (03/03/2023)   Overall Financial Resource Strain (CARDIA)    Difficulty of Paying Living Expenses: Not hard at all  Food Insecurity: No Food Insecurity (03/03/2023)   Hunger Vital Sign    Worried About Running Out of Food in the Last Year: Never true    Ran Out of Food in the Last Year: Never true  Transportation Needs: No Transportation Needs (03/03/2023)   PRAPARE - Administrator, Civil Service (Medical): No    Lack of Transportation (Non-Medical): No  Physical Activity: Unknown (03/03/2023)   Exercise Vital Sign    Days of Exercise per Week: 0 days    Minutes of Exercise per Session: Not on file  Stress: No Stress Concern Present (03/03/2023)   [employer] of Occupational Health - Occupational Stress Questionnaire    Feeling of Stress : Not at all  Social Connections: Unknown (03/03/2023)   Social Connection and Isolation Panel [NHANES]    Frequency of Communication with Friends and Family: More than three times a week    Frequency of Social Gatherings with Friends and Family: More than three times a week    Attends Religious Services: Patient declined    Database administrator or Organizations: No    Attends Engineer, structural: Not on file    Marital Status: Married  Catering manager Violence: Not on file   Family Status  Relation Name Status   Mother N Deceased at age 22       CARDIAC ARREST   Father  Deceased at age 40       CARDIAC ARREST   Sister N Deceased at age 69   Pat Aunt  Deceased   Pat Uncle N (Not Specified)    PGM N (Not Specified)   PGF N (Not Specified)   Other AUNT Alive   Other  (Not Specified)   Neg Hx  (Not Specified)  No partnership data on file   Family History  Problem Relation Age of Onset   Hypertension Mother    Heart disease Mother    Psychiatric Illness Father    Heart attack Father    Diabetes Sister    Ovarian cancer Sister    Lung cancer Paternal Aunt    Cancer Paternal Uncle    Heart disease Paternal Grandmother    Heart disease Paternal Grandfather    Diabetes Other    Coronary artery disease Other    Diabetes Other    Hypertension Other    Hyperlipidemia Other    Sleep apnea Neg Hx    Allergies  Allergen Reactions   Strawberry Extract Anaphylaxis        Dilaudid [Hydromorphone Hcl] Nausea And Vomiting    Patient Care Team: Debera Lat, PA-C as PCP - General (Physician Assistant) Antonieta Iba, MD as PCP - Cardiology (Cardiology) Antonieta Iba, MD as Consulting Physician (Cardiology)   Medications: Outpatient Medications Prior to Visit  Medication Sig   albuterol (VENTOLIN HFA) 108 (90 Base) MCG/ACT inhaler Inhale 2 puffs into the lungs every 6 (six) hours as needed for wheezing or shortness of breath.   ALPRAZolam (XANAX) 0.5 MG tablet Take 1 tablet (0.5 mg total) by mouth at bedtime.   ARTHRITIS PAIN RELIEF 650 MG  CR tablet Take 650 mg by mouth every 8 (eight) hours as needed.   aspirin EC 81 MG tablet Take 81 mg by mouth daily.   cetirizine (ZYRTEC) 10 MG tablet Take 10 mg by mouth at bedtime.   clobetasol ointment (TEMOVATE) 0.05 % Apply 1 Application topically 2 (two) times daily until smooth, then stop and use as needed.   clopidogrel (PLAVIX) 75 MG tablet Take 1 tablet (75 mg total) by mouth daily.   COVID-19 mRNA bivalent vaccine, Pfizer, (PFIZER COVID-19 VAC BIVALENT) injection Inject into the muscle. (Patient not taking: Reported on 08/30/2022)   COVID-19 mRNA vaccine 2023-2024 (COMIRNATY) syringe Inject into the muscle. (Patient not  taking: Reported on 08/30/2022)   desonide (DESOWEN) 0.05 % cream Apply 1 application topically 2 (two) times daily as needed (for psoriasis).   desonide (DESOWEN) 0.05 % cream Apply 1 Application topically 2 (two) times daily to affected areas on face and neck when flared.   ezetimibe (ZETIA) 10 MG tablet Take 1 tablet (10 mg total) by mouth daily. (pls call to sch 12 month follow up [phone])   isosorbide mononitrate (IMDUR) 30 MG 24 hr tablet Take 1 tablet (30 mg total) by mouth daily.   ketoconazole (NIZORAL) 2 % shampoo Apply 1 Application topically to affected area (leave on for 5 minutes) , rinse well, use 2-3 times per week.   LEXAPRO 20 MG tablet Take 1 tablet (20 mg total) by mouth daily.   metoprolol tartrate (LOPRESSOR) 25 MG tablet Take 1 tablet (25 mg total) by mouth 2 (two) times daily.   nitroGLYCERIN (NITROSTAT) 0.4 MG SL tablet PLACE 1 TABLET UNDER THE TONGUE EVERY 5 MINUTES AS NEEDED. MAY REPEAT FOR UP TO 3 DOSES. IF NO RELIEF WITH 1ST DOSE, CALL 911.   pantoprazole (PROTONIX) 20 MG tablet Take 1 tablet (20 mg total) by mouth daily.   ranolazine (RANEXA) 1000 MG SR tablet Take 1 tablet (1,000 mg total) by mouth 2 (two) times daily.   rosuvastatin (CRESTOR) 40 MG tablet Take 1 tablet (40 mg total) by mouth daily.   tacrolimus (PROTOPIC) 0.1 % ointment Apply twice daily to affected areas as needed   No facility-administered medications prior to visit.    Review of Systems  All other systems reviewed and are negative.  Except see HPI     Objective    BP 121/72 (BP Location: Right Arm, Patient Position: Sitting, Cuff Size: Normal)   Pulse 65   Ht 5\' 6"  (1.676 m)   Wt 202 lb 8 oz (91.9 kg)   BMI 32.68 kg/m      Physical Exam Vitals reviewed.  Constitutional:      General: He is not in acute distress.    Appearance: Normal appearance. He is obese. He is not diaphoretic.  HENT:     Head: Normocephalic and atraumatic.  Eyes:     General: No scleral icterus.     Conjunctiva/sclera: Conjunctivae normal.  Cardiovascular:     Rate and Rhythm: Normal rate and regular rhythm.     Pulses: Normal pulses.     Heart sounds: Normal heart sounds. No murmur heard. Pulmonary:     Effort: Pulmonary effort is normal. No respiratory distress.     Breath sounds: Normal breath sounds. No wheezing or rhonchi.  Musculoskeletal:     Cervical back: Neck supple.     Right lower leg: No edema.     Left lower leg: No edema.  Lymphadenopathy:     Cervical: No cervical  adenopathy.  Skin:    General: Skin is warm and dry.     Findings: No rash.  Neurological:     Mental Status: He is alert and oriented to person, place, and time. Mental status is at baseline.  Psychiatric:        Mood and Affect: Mood normal.        Behavior: Behavior normal.      No results found for any visits on 03/04/23.  Assessment & Plan       Immunization History  Administered Date(s) Administered   Fluad Quad(high Dose 65+) 01/24/2021, 04/20/2022   Fluad Trivalent(High Dose 65+) 01/24/2023   Influenza Split 01/13/2009   Influenza,inj,Quad PF,6+ Mos 03/30/2016, 12/31/2017   Influenza-Unspecified 01/26/2013, 01/24/2019, 02/03/2020   PFIZER Comirnaty(Gray Top)Covid-19 Tri-Sucrose Vaccine 11/18/2020   PFIZER(Purple Top)SARS-COV-2 Vaccination 06/23/2019, 07/14/2019, 02/15/2020   Pfizer Covid-19 Vaccine Bivalent Booster 44yrs & up 06/09/2021   Pfizer(Comirnaty)Fall Seasonal Vaccine 12 years and older 04/20/2022   Pneumococcal Conjugate-13 09/14/2019   Pneumococcal Polysaccharide-23 10/26/2021   Zoster Recombinant(Shingrix) 11/24/2021, 01/26/2022    Health Maintenance  Topic Date Due   DTaP/Tdap/Td vaccine (1 - Tdap) Never done   COVID-19 Vaccine (7 - 2023-24 season) 12/02/2022   Colon Cancer Screening  06/08/2027   Pneumonia Vaccine  Completed   Flu Shot  Completed   Hepatitis C Screening  Completed   Zoster (Shingles) Vaccine  Completed   HPV Vaccine  Aged Out   Obesity  (BMI 30.0-34.9) Chronic and stable Weight loss of 5% of pt's current weight via healthy diet and daily exercise encouraged.  History of Stroke and Coronary Artery Disease/hld On dual antiplatelet therapy with Aspirin and Plavix for secondary prevention of stroke and coronary artery disease. Last stroke in 2007. History of bypass surgery and stent placement in 2010 and 2011. -Continue Aspirin81mg  and Plavix  75mg  as agreed upon by neurologist and cardiologist. Continue lipids control with zetia 10mh and rosuvastatin 40mg /LP WNL  Atrial Fibrillation History of intermittent atrial fibrillation leading to stroke. Was on Coumadin for almost four years, but currently not on anticoagulation post maze procedure in 2010. -Continue current management.  Anxiety Managed with Lexapro and Xanax. Concerns raised about long-term use of Xanax and its potential contribution to cognitive decline. -Consider consultation with a psychiatrist for potential adjustment of anxiety medications and possible transition off Xanax. -Consider counseling and relaxation techniques as adjunctive therapy.  Chronic Kidney Disease Mild decrease in GFR noted. No current symptoms of kidney disease. -Increase hydration, avoid NSAIDs, and monitor renal function periodically.  Sleep Apnea Managed with CPAP. No current issues reported. -Continue CPAP use.  General Health Maintenance -Consider use of bed wedge and compression socks for prolonged standing. -Schedule physical in six weeks. -Consider tetanus shot today or at next visit.  Vaccination? Return in about 2 months (around 05/05/2023) for CPE.    The patient was advised to call back or seek an in-person evaluation if the symptoms worsen or if the condition fails to improve as anticipated.  I discussed the assessment and treatment plan with the patient. The patient was provided an opportunity to ask questions and all were answered. The patient agreed with the plan and  demonstrated an understanding of the instructions.  I, Debera Lat, PA-C have reviewed all documentation for this visit. The documentation on  03/04/23  for the exam, diagnosis, procedures, and orders are all accurate and complete.  Debera Lat, [facility], MMS [facility] [phone] (phone) [phone] (fax)  [facility]

## 2023-03-04 ENCOUNTER — Encounter: Payer: Self-pay | Admitting: Physician Assistant

## 2023-03-04 ENCOUNTER — Ambulatory Visit (INDEPENDENT_AMBULATORY_CARE_PROVIDER_SITE_OTHER): Payer: 59 | Admitting: Physician Assistant

## 2023-03-04 VITALS — BP 121/72 | HR 65 | Ht 66.0 in | Wt 202.5 lb

## 2023-03-04 DIAGNOSIS — F419 Anxiety disorder, unspecified: Secondary | ICD-10-CM | POA: Diagnosis not present

## 2023-03-04 DIAGNOSIS — Z8673 Personal history of transient ischemic attack (TIA), and cerebral infarction without residual deficits: Secondary | ICD-10-CM

## 2023-03-04 DIAGNOSIS — E66811 Obesity, class 1: Secondary | ICD-10-CM

## 2023-03-04 DIAGNOSIS — Z0000 Encounter for general adult medical examination without abnormal findings: Secondary | ICD-10-CM

## 2023-03-04 DIAGNOSIS — E782 Mixed hyperlipidemia: Secondary | ICD-10-CM | POA: Diagnosis not present

## 2023-03-04 DIAGNOSIS — I480 Paroxysmal atrial fibrillation: Secondary | ICD-10-CM | POA: Diagnosis not present

## 2023-03-04 DIAGNOSIS — N189 Chronic kidney disease, unspecified: Secondary | ICD-10-CM

## 2023-03-05 DIAGNOSIS — N189 Chronic kidney disease, unspecified: Secondary | ICD-10-CM | POA: Insufficient documentation

## 2023-03-07 ENCOUNTER — Encounter: Payer: Self-pay | Admitting: Anesthesiology

## 2023-03-11 ENCOUNTER — Other Ambulatory Visit (HOSPITAL_COMMUNITY): Payer: Self-pay

## 2023-03-11 NOTE — Telephone Encounter (Signed)
Called pt and spoke with wife Sussane(ok per [name]) regarding his appt and not being able to see his CPAP download. Pt's wife stated she had called [facility] and they were going to send her the report today or tomorrow. She will send it to [place] as soon as she receives it.

## 2023-03-12 ENCOUNTER — Other Ambulatory Visit: Payer: Self-pay | Admitting: Gastroenterology

## 2023-03-12 ENCOUNTER — Telehealth (INDEPENDENT_AMBULATORY_CARE_PROVIDER_SITE_OTHER): Payer: 59 | Admitting: Adult Health

## 2023-03-12 DIAGNOSIS — G4733 Obstructive sleep apnea (adult) (pediatric): Secondary | ICD-10-CM | POA: Diagnosis not present

## 2023-03-12 NOTE — Progress Notes (Signed)
PATIENT: Kevin [name] DOB: March 25, 1956  REASON FOR VISIT: follow up HISTORY FROM: patient   Virtual Visit via Video Note  I connected with Kevin [name] on 03/12/23 at  1:00 PM EST by a video enabled telemedicine application located remotely at [facility] and verified that I am speaking with the correct person using two identifiers who was located at their own home.   I discussed the limitations of evaluation and management by telemedicine and the availability of in person appointments. The patient expressed understanding and agreed to proceed.   PATIENT: Kevin [name] DOB: Aug 06, 1955  REASON FOR VISIT: follow up HISTORY FROM: patient     HISTORY OF PRESENT ILLNESS: Today 03/12/23:  Kevin [name] is a 67 y.o. male with a history of OSA on CPAP. Returns today for follow-up. He reports that the CPAP is working well for him.  We have not been able to obtain a download.  Was unable to get a wirelessly.  We are reaching out to the DME company.  He states that he approximately uses the CPAP 9 hours each night.  Denies any new issues.      REVIEW OF SYSTEMS: Out of a complete 14 system review of symptoms, the patient complains only of the following symptoms, and all other reviewed systems are negative.  ALLERGIES: Allergies  Allergen Reactions   Strawberry Extract Anaphylaxis        Dilaudid [Hydromorphone Hcl] Nausea And Vomiting    HOME MEDICATIONS: Outpatient Medications Prior to Visit  Medication Sig Dispense Refill   albuterol (VENTOLIN HFA) 108 (90 Base) MCG/ACT inhaler Inhale 2 puffs into the lungs every 6 (six) hours as needed for wheezing or shortness of breath. 8 g 0   ALPRAZolam (XANAX) 0.5 MG tablet Take 1 tablet (0.5 mg total) by mouth at bedtime. 30 tablet 1   ARTHRITIS PAIN RELIEF 650 MG CR tablet Take 650 mg by mouth every 8 (eight) hours as needed.     aspirin EC 81 MG tablet Take 81 mg by mouth daily.     cetirizine (ZYRTEC) 10 MG  tablet Take 10 mg by mouth at bedtime.     clobetasol ointment (TEMOVATE) 0.05 % Apply 1 Application topically 2 (two) times daily until smooth, then stop and use as needed. 30 g 2   clopidogrel (PLAVIX) 75 MG tablet Take 1 tablet (75 mg total) by mouth daily. 90 tablet 2   COVID-19 mRNA bivalent vaccine, Pfizer, (PFIZER COVID-19 VAC BIVALENT) injection Inject into the muscle. (Patient not taking: Reported on 08/30/2022) 0.3 mL 0   COVID-19 mRNA vaccine 2023-2024 (COMIRNATY) syringe Inject into the muscle. (Patient not taking: Reported on 08/30/2022) 0.3 mL 0   desonide (DESOWEN) 0.05 % cream Apply 1 application topically 2 (two) times daily as needed (for psoriasis). 30 g 2   desonide (DESOWEN) 0.05 % cream Apply 1 Application topically 2 (two) times daily to affected areas on face and neck when flared. 30 g 2   ezetimibe (ZETIA) 10 MG tablet Take 1 tablet (10 mg total) by mouth daily. (pls call to sch 12 month follow up [phone]) 90 tablet 3   isosorbide mononitrate (IMDUR) 30 MG 24 hr tablet Take 1 tablet (30 mg total) by mouth daily. 90 tablet 3   ketoconazole (NIZORAL) 2 % shampoo Apply 1 Application topically to affected area (leave on for 5 minutes) , rinse well, use 2-3 times per week. 120 mL 11   LEXAPRO 20 MG  tablet Take 1 tablet (20 mg total) by mouth daily. 90 tablet 3   metoprolol tartrate (LOPRESSOR) 25 MG tablet Take 1 tablet (25 mg total) by mouth 2 (two) times daily. 180 tablet 3   nitroGLYCERIN (NITROSTAT) 0.4 MG SL tablet PLACE 1 TABLET UNDER THE TONGUE EVERY 5 MINUTES AS NEEDED. MAY REPEAT FOR UP TO 3 DOSES. IF NO RELIEF WITH 1ST DOSE, CALL 911. 25 tablet 0   pantoprazole (PROTONIX) 20 MG tablet Take 1 tablet (20 mg total) by mouth daily. 90 tablet 0   ranolazine (RANEXA) 1000 MG SR tablet Take 1 tablet (1,000 mg total) by mouth 2 (two) times daily. 180 tablet 3   rosuvastatin (CRESTOR) 40 MG tablet Take 1 tablet (40 mg total) by mouth daily. 90 tablet 3   tacrolimus (PROTOPIC)  0.1 % ointment Apply twice daily to affected areas as needed 30 g 2   No facility-administered medications prior to visit.    PAST MEDICAL HISTORY: Past Medical History:  Diagnosis Date   Allergy    Since child   Asthma Childhood   Brachial plexopathy    CKD (chronic kidney disease), stage II - III (HCC)    Coronary artery disease    a. s/p 4 vessel CABG in 05/2008 w/ LIMA-LAD, SVG-Diag, sequential SVG-OM1/OM2; b. s/p PCI/DES x 2 to LAD in 10/2009; c. LHC 03/2013 stable disease and patent LAD stents; d. 10/2016 Cath: LM min irregs, LAD 20p, 9m ISR, D2 80ost, LCX small, min irregs, OM1/2/3 min irregs, RCA/RPDA/RPAV min irregs, RPL1/2 nl RPL3 min irregs, VG->D2 nl, VG->OM1->OM2 100, EF 55-65%.   Depression 1988   H/O maze procedure 05/17/2008   a. 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    Hypertension    Memory deficit after cerebral infarction    OSA (obstructive sleep apnea)    Overweight(278.02)    PAF (paroxysmal atrial fibrillation) (HCC)    a. s/p Maze 05/2008, previously on Eliquis->discontinued 05/2016; c. CHADS2VASc => 4 (HTN, stroke x 2, vascular disease)   Patent foramen ovale    a. 05/2008 s/p closure @ time of CABG.   Psoriatic arthritis (HCC)    Skin cancer    Sleep apnea    Stroke [facility])    a. 07/2005 right brain; b. 05/2005 left brain; c. Resultant memory deficits.   Thrombocytopenia (HCC)     PAST SURGICAL HISTORY: Past Surgical History:  Procedure Laterality Date   ACUTE PANCREATITIS  5/11   APPENDECTOMY  1984   CARDIAC CATHETERIZATION     CHOLECYSTECTOMY  08/19/2009   COLONOSCOPY WITH PROPOFOL N/A 06/07/2017   Procedure: COLONOSCOPY WITH PROPOFOL;  Surgeon: Pasty Spillers, MD;  Location: [facility] ENDOSCOPY;  Service: Endoscopy;  Laterality: N/A;   CORONARY ARTERY BYPASS GRAFT  05/12/2008   x4 by PeterVan Trigt,MD   ESOPHAGOGASTRODUODENOSCOPY N/A 03/03/2020   Procedure:  ESOPHAGOGASTRODUODENOSCOPY (EGD);  Surgeon: Pasty Spillers, MD;  Location: [facility] ENDOSCOPY;  Service: Endoscopy;  Laterality: N/A;   LEFT HEART CATH AND CORONARY ANGIOGRAPHY N/A 11/29/2016   Procedure: LEFT HEART CATH AND CORONARY ANGIOGRAPHY;  Surgeon: Iran Ouch, MD;  Location: [facility] INVASIVE CV LAB;  Service: Cardiovascular;  Laterality: N/A;   PATENT FORAMEN OVALE CLOSURE     TONSILLECTOMY     AS A CHILD    FAMILY HISTORY: Family History  Problem Relation Age of Onset   Hypertension Mother  Heart disease Mother    Psychiatric Illness Father    Heart attack Father    Diabetes Sister    Ovarian cancer Sister    Lung cancer Paternal Aunt    Cancer Paternal Uncle    Heart disease Paternal Grandmother    Heart disease Paternal Grandfather    Diabetes Other    Coronary artery disease Other    Diabetes Other    Hypertension Other    Hyperlipidemia Other    Sleep apnea Neg Hx     SOCIAL HISTORY: Social History   Socioeconomic History   Marital status: Married    Spouse name: Lynnae Sandhoff   Number of children: 3   Years of education: Not on file   Highest education level: Some college, no degree  Occupational History   Occupation: Full time    Employer: [employer]  Tobacco Use   Smoking status: Never   Smokeless tobacco: Never  Vaping Use   Vaping status: Never Used  Substance and Sexual Activity   Alcohol use: Yes    Comment: occassionally   Drug use: No   Sexual activity: Yes    Birth control/protection: None  Other Topics Concern   Not on file  Social History Narrative   Married Naval architect) ,Scientist, 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 He has worked at his present job for 22 years. He has been married for 34 years. They have 3 children. 2 of his daughters live in [place]. He drinks less than two cups of caffeine per day. He does not use  tobacco or recreational drugs. He has  rare alcohol intake. Lives with wife and daughter      OSA diagnosed at [facility] , had his  sleep studies there  reviewed them all in detail today he  has retrognathia, sinusitis,  rhinitis      His ESS remains very high  at 16 and FSS at 48 . His falls assessment tool score is 5.   nasonex, refitted mask, the recent  HST failed to document that  apnea is present at all. education about REM BD and EDS, narcolepsy , cataplexy.  45 minutes.    Social Determinants of Health   Financial Resource Strain: Low Risk  (03/03/2023)   Overall Financial Resource Strain (CARDIA)    Difficulty of Paying Living Expenses: Not hard at all  Food Insecurity: No Food Insecurity (03/03/2023)   Hunger Vital Sign    Worried About Running Out of Food in the Last Year: Never true    Ran Out of Food in the Last Year: Never true  Transportation Needs: No Transportation Needs (03/03/2023)   PRAPARE - Administrator, Civil Service (Medical): No    Lack of Transportation (Non-Medical): No  Physical Activity: Unknown (03/03/2023)   Exercise Vital Sign    Days of Exercise per Week: 0 days    Minutes of Exercise per Session: Not on file  Stress: No Stress Concern Present (03/03/2023)   [employer] of Occupational Health - Occupational Stress Questionnaire    Feeling of Stress : Not at all  Social Connections: Unknown (03/03/2023)   Social Connection and Isolation Panel [NHANES]    Frequency of Communication with Friends and Family: More than three times a week    Frequency of Social Gatherings with Friends and Family: More than three times a week    Attends Religious Services: Patient declined  Active Member of Clubs or Organizations: No    Attends Banker Meetings: Not on file    Marital Status: Married  Catering manager Violence: Not on file      PHYSICAL EXAM Generalized: Well developed, in no acute distress   Neurological examination  Mentation: Alert oriented to  time, place, history taking. Follows all commands speech and language fluent Cranial nerve II-XII: Facial symmetry noted DIAGNOSTIC DATA (LABS, IMAGING, TESTING) - I reviewed patient records, labs, notes, testing and imaging myself where available.  Lab Results  Component Value Date   WBC 6.5 01/24/2023   HGB 12.9 (L) 01/24/2023   HCT 40.6 01/24/2023   MCV 100 (H) 01/24/2023   PLT 186 01/24/2023      Component Value Date/Time   NA 141 01/24/2023 0932   NA 141 03/18/2013 1739   K 4.7 01/24/2023 0932   K 4.0 03/18/2013 1739   CL 104 01/24/2023 0932   CL 107 03/18/2013 1739   CO2 25 01/24/2023 0932   CO2 30 03/18/2013 1739   GLUCOSE 110 (H) 01/24/2023 0932   GLUCOSE 115 (H) 09/22/2022 0839   GLUCOSE 79 03/18/2013 1739   BUN 21 01/24/2023 0932   BUN 23 (H) 03/18/2013 1739   CREATININE 1.34 (H) 01/24/2023 0932   CREATININE 1.50 (H) 12/18/2013 1008   CALCIUM 9.4 01/24/2023 0932   CALCIUM 9.2 03/18/2013 1739   PROT 6.6 01/24/2023 0932   ALBUMIN 4.5 01/24/2023 0932   AST 16 01/24/2023 0932   ALT 12 01/24/2023 0932   ALKPHOS 43 (L) 01/24/2023 0932   BILITOT 0.6 01/24/2023 0932   GFRNONAA 53 (L) 09/22/2022 0839   GFRNONAA 50 (L) 12/18/2013 1008   GFRAA 63 03/29/2020 1027   GFRAA 58 (L) 12/18/2013 1008   Lab Results  Component Value Date   CHOL 114 01/24/2023   HDL 61 01/24/2023   LDLCALC 40 01/24/2023   TRIG 57 01/24/2023   CHOLHDL 1.9 01/24/2023   Lab Results  Component Value Date   HGBA1C  05/11/2008    5.5 (NOTE)   The ADA recommends the following therapeutic goal for glycemic   control related to Hgb A1C measurement:   Goal of Therapy:   < 7.0% Hgb A1C   Reference: American Diabetes Association: Clinical Practice   Recommendations 2008, Diabetes Care,  2008, 31:(Suppl 1).   Lab Results  Component Value Date   VITAMINB12 218 01/24/2021   Lab Results  Component Value Date   TSH 2.210 12/08/2020      ASSESSMENT AND PLAN 67 y.o. year old male  has a past  medical history of Allergy, Asthma (Childhood), Brachial plexopathy, CKD (chronic kidney disease), stage II - III (HCC), Coronary artery disease, Depression (1988), H/O maze procedure (05/17/2008), Hyperlipidemia, Hypersomnia, organic (09/24/2012), Hypertension, Memory deficit after cerebral infarction, OSA (obstructive sleep apnea), Overweight(278.02), PAF (paroxysmal atrial fibrillation) (HCC), Patent foramen ovale, Psoriatic arthritis (HCC), Skin cancer, Sleep apnea, Stroke (HCC), and Thrombocytopenia (HCC). here with:  OSA on CPAP  Will reach out to DME regarding CPAP download. Encouraged patient to continue using CPAP nightly and > 4 hours each night F/U in 1 year or sooner if needed    Butch Penny, MSN, NP-C 03/12/2023, 1:14 PM [facility] [address] [place], [place] [address] [phone]

## 2023-03-13 NOTE — Telephone Encounter (Signed)
Last office visit 01/22/2022 GERD  Last refill 10/22/2022 Told on 01/23/2023 needed a appointment

## 2023-03-20 ENCOUNTER — Telehealth: Payer: Self-pay | Admitting: Gastroenterology

## 2023-03-20 MED ORDER — PANTOPRAZOLE SODIUM 20 MG PO TBEC
20.0000 mg | DELAYED_RELEASE_TABLET | Freq: Every day | ORAL | 0 refills | Status: DC
  Filled 2023-03-20: qty 90, 90d supply, fill #0

## 2023-03-20 NOTE — Telephone Encounter (Signed)
Last office visit 01/22/2022 GERD  Last refill 10/22/2022 0 refills  Sent medication to the pharmacy

## 2023-03-20 NOTE — Addendum Note (Signed)
Addended by: Radene Knee L on: 03/20/2023 04:18 PM   Modules accepted: Orders

## 2023-03-20 NOTE — Telephone Encounter (Signed)
The patient wife called in to schedule him for an office visit for his medication refill. He takes (Protonix) 20 MG and his pharmacy is [facility] [place] on [address] [place], [place], [place] [address]. His appointment is on 05/20/2023 at 3:30.

## 2023-03-21 ENCOUNTER — Other Ambulatory Visit: Payer: Self-pay

## 2023-03-21 ENCOUNTER — Other Ambulatory Visit (HOSPITAL_COMMUNITY): Payer: Self-pay

## 2023-03-31 ENCOUNTER — Other Ambulatory Visit (HOSPITAL_COMMUNITY): Payer: Self-pay

## 2023-04-01 ENCOUNTER — Other Ambulatory Visit: Payer: Self-pay

## 2023-04-02 ENCOUNTER — Other Ambulatory Visit: Payer: Self-pay

## 2023-04-04 ENCOUNTER — Other Ambulatory Visit: Payer: Self-pay

## 2023-04-15 ENCOUNTER — Other Ambulatory Visit: Payer: Self-pay

## 2023-05-01 ENCOUNTER — Other Ambulatory Visit: Payer: Self-pay | Admitting: Physician Assistant

## 2023-05-01 DIAGNOSIS — F419 Anxiety disorder, unspecified: Secondary | ICD-10-CM

## 2023-05-02 ENCOUNTER — Other Ambulatory Visit (HOSPITAL_COMMUNITY): Payer: Self-pay

## 2023-05-03 NOTE — Telephone Encounter (Signed)
Requested medication (s) are due for refill today: yes  Requested medication (s) are on the active medication list: yes  Last refill:  01/24/23  Future visit scheduled: no  Notes to clinic:  Unable to refill per protocol, cannot delegate.      Requested Prescriptions  Pending Prescriptions Disp Refills   ALPRAZolam (XANAX) 0.5 MG tablet 30 tablet 1    Sig: Take 1 tablet (0.5 mg total) by mouth at bedtime.     Not Delegated - Psychiatry: Anxiolytics/Hypnotics 2 Failed - 05/03/2023  8:22 AM      Failed - This refill cannot be delegated      Failed - Urine Drug Screen completed in last 360 days      Passed - Patient is not pregnant      Passed - Valid encounter within last 6 months    Recent Outpatient Visits           2 months ago Anxiety   [facility] [place], [place], PA-C   3 months ago Obesity (BMI 30.0-34.9)   [facility] [place], [place], [place]   1 year ago Anxiety   [facility] Alfredia [place], [place]   2 years ago Anxiety   [facility] [place], [place]   2 years ago Hematospermia   [facility] [place], [place]       Future Appointments             In 2 weeks Vanga, Loel Dubonnet, MD [facility] [place]   In 10 months Butch Penny, NP [facility]

## 2023-05-04 ENCOUNTER — Other Ambulatory Visit (HOSPITAL_COMMUNITY): Payer: Self-pay

## 2023-05-06 ENCOUNTER — Other Ambulatory Visit: Payer: Self-pay

## 2023-05-06 ENCOUNTER — Other Ambulatory Visit (HOSPITAL_COMMUNITY): Payer: Self-pay

## 2023-05-06 MED ORDER — ALPRAZOLAM 0.5 MG PO TABS
0.5000 mg | ORAL_TABLET | Freq: Every day | ORAL | 1 refills | Status: DC
  Filled 2023-05-06 (×2): qty 30, 30d supply, fill #0
  Filled 2023-06-07: qty 30, 30d supply, fill #1

## 2023-05-20 ENCOUNTER — Encounter: Payer: Self-pay | Admitting: Gastroenterology

## 2023-05-20 ENCOUNTER — Other Ambulatory Visit: Payer: Self-pay

## 2023-05-20 ENCOUNTER — Ambulatory Visit (INDEPENDENT_AMBULATORY_CARE_PROVIDER_SITE_OTHER): Payer: 59 | Admitting: Gastroenterology

## 2023-05-20 ENCOUNTER — Telehealth: Payer: Self-pay | Admitting: *Deleted

## 2023-05-20 ENCOUNTER — Telehealth: Payer: Self-pay

## 2023-05-20 VITALS — BP 134/82 | HR 81 | Temp 97.7°F | Wt 204.0 lb

## 2023-05-20 DIAGNOSIS — K219 Gastro-esophageal reflux disease without esophagitis: Secondary | ICD-10-CM | POA: Diagnosis not present

## 2023-05-20 DIAGNOSIS — Z8719 Personal history of other diseases of the digestive system: Secondary | ICD-10-CM | POA: Diagnosis not present

## 2023-05-20 DIAGNOSIS — Z1211 Encounter for screening for malignant neoplasm of colon: Secondary | ICD-10-CM

## 2023-05-20 MED ORDER — PEG 3350-KCL-NABCB-NACL-NASULF 236 G PO SOLR
4000.0000 mL | Freq: Once | ORAL | 0 refills | Status: AC
Start: 2023-05-20 — End: 2023-05-22
  Filled 2023-05-20: qty 4000, 1d supply, fill #0

## 2023-05-20 NOTE — Telephone Encounter (Signed)
Clearance faxed to Dr Mariah Milling faxed x 2

## 2023-05-20 NOTE — Progress Notes (Signed)
Kevin Repress, MD [address]  [address]  [place], [place] [address]  Main: [phone]  Fax: [phone]    Gastroenterology Consultation  Referring Provider:     Debera Lat, PA-C Primary Care Physician:  Debera Lat, PA-C Primary Gastroenterologist:  Dr. Arlyss [name] Reason for Consultation: Chronic GERD, history of recurrent diverticulitis        HPI:   Usher Hedberg is a 68 y.o. male referred by Debera Lat, PA-C  for consultation & management of chronic GERD.  Patient has history of heartburn for which she is maintained on Protonix 20 mg daily, keeps his symptoms under control.  He has gained about 14 pounds since November last year.  He denies any recent episodes of diverticulitis  Follow-up visit 2 1725 Mr. Whitcomb is doing well without any GI concerns.  He is past due for his colonoscopy.  He continues to take low-dose pantoprazole 20 mg daily.  He is accompanied by his wife today  NSAIDs: None  Antiplts/Anticoagulants/Anti thrombotics: None  GI Procedures:  Upper endoscopy 03/03/2020 - Mild Schatzki ring. Dilated to 20mm. - Z-line regular. - Normal esophagus. Biopsied. - Normal stomach. Biopsied. - Normal duodenal bulb, second portion of the duodenum and examined duodenum. DIAGNOSIS:  A.  STOMACH; COLD BIOPSY:  - ANTRAL AND OXYNTIC MUCOSA WITHOUT PATHOLOGIC CHANGES.  - NEGATIVE FOR H. PYLORI, INTESTINAL METAPLASIA, DYSPLASIA, AND  MALIGNANCY.   B.  ESOPHAGUS; COLD BIOPSY:  - STRATIFIED SQUAMOUS EPITHELIUM WITHOUT EOSINOPHILS, NEUTROPHILS, OR  REACTIVE CHANGES.  - NEGATIVE FOR DYSPLASIA AND MALIGNANCY.   Colonoscopy 06/07/2017 - Diverticulosis in the sigmoid colon, in the descending colon and in the ascending colon. - The examination was otherwise normal. - The rectum, sigmoid colon, descending colon, transverse colon, ascending colon and cecum are normal. - Internal hemorrhoids. - No specimens collected.  Past Medical History:  Diagnosis  Date   Allergy    Since child   Asthma Childhood   Brachial plexopathy    CKD (chronic kidney disease), stage II - III (HCC)    Coronary artery disease    a. s/p 4 vessel CABG in 05/2008 w/ LIMA-LAD, SVG-Diag, sequential SVG-OM1/OM2; b. s/p PCI/DES x 2 to LAD in 10/2009; c. LHC 03/2013 stable disease and patent LAD stents; d. 10/2016 Cath: LM min irregs, LAD 20p, 41m ISR, D2 80ost, LCX small, min irregs, OM1/2/3 min irregs, RCA/RPDA/RPAV min irregs, RPL1/2 nl RPL3 min irregs, VG->D2 nl, VG->OM1->OM2 100, EF 55-65%.   Depression 1988   H/O maze procedure 05/17/2008   a. 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    Hypertension    Memory deficit after cerebral infarction    OSA (obstructive sleep apnea)    Overweight(278.02)    PAF (paroxysmal atrial fibrillation) (HCC)    a. s/p Maze 05/2008, previously on Eliquis->discontinued 05/2016; c. CHADS2VASc => 4 (HTN, stroke x 2, vascular disease)   Patent foramen ovale    a. 05/2008 s/p closure @ time of CABG.   Psoriatic arthritis (HCC)    Skin cancer    Sleep apnea    Stroke [facility])    a. 07/2005 right brain; b. 05/2005 left brain; c. Resultant memory deficits.   Thrombocytopenia [facility])     Past Surgical History:  Procedure Laterality Date   ACUTE PANCREATITIS  5/11   APPENDECTOMY  1984   CARDIAC CATHETERIZATION  CHOLECYSTECTOMY  08/19/2009   COLONOSCOPY WITH PROPOFOL N/A 06/07/2017   Procedure: COLONOSCOPY WITH PROPOFOL;  Surgeon: Pasty Spillers, MD;  Location: [facility] ENDOSCOPY;  Service: Endoscopy;  Laterality: N/A;   CORONARY ARTERY BYPASS GRAFT  05/12/2008   x4 by PeterVan Trigt,MD   ESOPHAGOGASTRODUODENOSCOPY N/A 03/03/2020   Procedure: ESOPHAGOGASTRODUODENOSCOPY (EGD);  Surgeon: Pasty Spillers, MD;  Location: [facility] ENDOSCOPY;  Service: Endoscopy;  Laterality: N/A;   LEFT HEART CATH AND CORONARY ANGIOGRAPHY N/A 11/29/2016   Procedure: LEFT HEART CATH AND  CORONARY ANGIOGRAPHY;  Surgeon: Iran Ouch, MD;  Location: [facility] INVASIVE CV LAB;  Service: Cardiovascular;  Laterality: N/A;   PATENT FORAMEN OVALE CLOSURE     TONSILLECTOMY     AS A CHILD     Current Outpatient Medications:    albuterol (VENTOLIN HFA) 108 (90 Base) MCG/ACT inhaler, Inhale 2 puffs into the lungs every 6 (six) hours as needed for wheezing or shortness of breath., Disp: 8 g, Rfl: 0   ALPRAZolam (XANAX) 0.5 MG tablet, Take 1 tablet (0.5 mg total) by mouth at bedtime., Disp: 30 tablet, Rfl: 1   ARTHRITIS PAIN RELIEF 650 MG CR tablet, Take 650 mg by mouth every 8 (eight) hours as needed., Disp: , Rfl:    aspirin EC 81 MG tablet, Take 81 mg by mouth daily., Disp: , Rfl:    cetirizine (ZYRTEC) 10 MG tablet, Take 10 mg by mouth at bedtime., Disp: , Rfl:    clobetasol ointment (TEMOVATE) 0.05 %, Apply 1 Application topically 2 (two) times daily until smooth, then stop and use as needed., Disp: 30 g, Rfl: 2   clopidogrel (PLAVIX) 75 MG tablet, Take 1 tablet (75 mg total) by mouth daily., Disp: 90 tablet, Rfl: 2   COVID-19 mRNA bivalent vaccine, Pfizer, (PFIZER COVID-19 VAC BIVALENT) injection, Inject into the muscle., Disp: 0.3 mL, Rfl: 0   COVID-19 mRNA vaccine 2023-2024 (COMIRNATY) syringe, Inject into the muscle., Disp: 0.3 mL, Rfl: 0   desonide (DESOWEN) 0.05 % cream, Apply 1 application topically 2 (two) times daily as needed (for psoriasis)., Disp: 30 g, Rfl: 2   desonide (DESOWEN) 0.05 % cream, Apply 1 Application topically 2 (two) times daily to affected areas on face and neck when flared., Disp: 30 g, Rfl: 2   ezetimibe (ZETIA) 10 MG tablet, Take 1 tablet (10 mg total) by mouth daily. (pls call to sch 12 month follow up [phone]), Disp: 90 tablet, Rfl: 3   isosorbide mononitrate (IMDUR) 30 MG 24 hr tablet, Take 1 tablet (30 mg total) by mouth daily., Disp: 90 tablet, Rfl: 3   ketoconazole (NIZORAL) 2 % shampoo, Apply 1 Application topically to affected area (leave on  for 5 minutes) , rinse well, use 2-3 times per week., Disp: 120 mL, Rfl: 11   LEXAPRO 20 MG tablet, Take 1 tablet (20 mg total) by mouth daily., Disp: 90 tablet, Rfl: 3   metoprolol tartrate (LOPRESSOR) 25 MG tablet, Take 1 tablet (25 mg total) by mouth 2 (two) times daily., Disp: 180 tablet, Rfl: 3   pantoprazole (PROTONIX) 20 MG tablet, Take 1 tablet (20 mg total) by mouth daily., Disp: 90 tablet, Rfl: 0   polyethylene glycol (GOLYTELY) 236 g solution, Take 4,000 mLs by mouth once for 1 dose., Disp: 4000 mL, Rfl: 0   ranolazine (RANEXA) 1000 MG SR tablet, Take 1 tablet (1,000 mg total) by mouth 2 (two) times daily., Disp: 180 tablet, Rfl: 3   rosuvastatin (CRESTOR) 40 MG tablet, Take 1 tablet (  40 mg total) by mouth daily., Disp: 90 tablet, Rfl: 3   tacrolimus (PROTOPIC) 0.1 % ointment, Apply twice daily to affected areas as needed, Disp: 30 g, Rfl: 2   nitroGLYCERIN (NITROSTAT) 0.4 MG SL tablet, PLACE 1 TABLET UNDER THE TONGUE EVERY 5 MINUTES AS NEEDED. MAY REPEAT FOR UP TO 3 DOSES. IF NO RELIEF WITH 1ST DOSE, CALL 911., Disp: 25 tablet, Rfl: 0   Family History  Problem Relation Age of Onset   Hypertension Mother    Heart disease Mother    Psychiatric Illness Father    Heart attack Father    Diabetes Sister    Ovarian cancer Sister    Lung cancer Paternal Aunt    Cancer Paternal Uncle    Heart disease Paternal Grandmother    Heart disease Paternal Grandfather    Diabetes Other    Coronary artery disease Other    Diabetes Other    Hypertension Other    Hyperlipidemia Other    Sleep apnea Neg Hx      Social History   Tobacco Use   Smoking status: Never   Smokeless tobacco: Never  Vaping Use   Vaping status: Never Used  Substance Use Topics   Alcohol use: Yes    Comment: occassionally   Drug use: No    Allergies as of 05/20/2023 - Review Complete 05/20/2023  Allergen Reaction Noted   Strawberry extract Anaphylaxis 12/04/2011   Dilaudid [hydromorphone hcl] Nausea And  Vomiting 06/21/2010    Review of Systems:    All systems reviewed and negative except where noted in HPI.   Physical Exam:  BP 134/82 (BP Location: Left Arm, Patient Position: Sitting, Cuff Size: Normal)   Pulse 81   Temp 97.7 F (36.5 C) (Oral)   Wt 204 lb (92.5 kg)   BMI 32.93 kg/m  No LMP for male patient.  General:   Alert,  Well-developed, well-nourished, pleasant and cooperative in NAD Head:  Normocephalic and atraumatic. Eyes:  Sclera clear, no icterus.   Conjunctiva pink. Ears:  Normal auditory acuity. Nose:  No deformity, discharge, or lesions. Mouth:  No deformity or lesions,oropharynx pink & moist. Neck:  Supple; no masses or thyromegaly. Lungs:  Respirations even and unlabored.  Clear throughout to auscultation.   No wheezes, crackles, or rhonchi. No acute distress. Heart:  Regular rate and rhythm; no murmurs, clicks, rubs, or gallops. Abdomen:  Normal bowel sounds. Soft, non-tender and non-distended without masses, hepatosplenomegaly or hernias noted.  No guarding or rebound tenderness.   Rectal: Not performed Msk:  Symmetrical without gross deformities. Good, equal movement & strength bilaterally. Pulses:  Normal pulses noted. Extremities:  No clubbing or edema.  No cyanosis. Neurologic:  Alert and oriented x3;  grossly normal neurologically. Skin:  Intact without significant lesions or rashes. No jaundice. Psych:  Alert and cooperative. Normal mood and affect.  Imaging Studies: Reviewed  Assessment and Plan:   Seena Face is a 68 y.o. male with history of A-fib, CVA on aspirin and Plavix is seen in for follow-up of chronic GERD  Chronic GERD, underwent dilation of Schatzki's ring in 2022 Continue Protonix 20 mg daily before meals long-term  Colon cancer screening Schedule colonoscopy  Follow up as needed   Kevin Repress, MD

## 2023-05-20 NOTE — Telephone Encounter (Signed)
   Pre-operative Risk Assessment    Patient Name: Kevin [name]  DOB: 1955/11/12 MRN: [id number]   Date of last office visit: 08/30/22 SHERI HAMMOCK, NP Date of next office visit: NONE   Request for Surgical Clearance    Procedure:   COLONOSCOPY  Date of Surgery:  Clearance 06/13/23                                Surgeon:  DR. Lannette Donath Surgeon's Group or Practice Name:  [facility] Phone number:  [phone]  Fax number:  [phone] ATTN: MELANIE   Type of Clearance Requested:   - Medical  - Pharmacy:  Hold Clopidogrel (Plavix)     Type of Anesthesia:  General    Additional requests/questions:    [name]   05/20/2023, 5:38 PM

## 2023-05-20 NOTE — Addendum Note (Signed)
Addended by: Roena Malady on: 05/20/2023 05:13 PM   Modules accepted: Orders

## 2023-05-21 ENCOUNTER — Other Ambulatory Visit: Payer: Self-pay

## 2023-05-21 ENCOUNTER — Telehealth: Payer: Self-pay | Admitting: *Deleted

## 2023-05-21 ENCOUNTER — Other Ambulatory Visit (HOSPITAL_COMMUNITY): Payer: Self-pay

## 2023-05-21 NOTE — Telephone Encounter (Signed)
S/w the pt's wife ([name]). She has scheduled tele preop appt 06/24/23 for the pt. She will need to call the pt in on the call as she will be at work, however she states his memory is not very good and needs help at times.   Preop APP will call her and she will call the pt into the call.   Med rec and consent are done.     Patient Consent for Virtual Visit        Kevin [name] has provided verbal consent on 05/21/2023 for a virtual visit (video or telephone).   CONSENT FOR VIRTUAL VISIT FOR:  Kevin [name]  By participating in this virtual visit I agree to the following:  I hereby voluntarily request, consent and authorize [facility] and its employed or contracted physicians, physician assistants, nurse practitioners or other licensed health care professionals (the Practitioner), to provide me with telemedicine health care services (the "Services") as deemed necessary by the treating Practitioner. I acknowledge and consent to receive the Services by the Practitioner via telemedicine. I understand that the telemedicine visit will involve communicating with the Practitioner through live audiovisual communication technology and the disclosure of certain medical information by electronic transmission. I acknowledge that I have been given the opportunity to request an in-person assessment or other available alternative prior to the telemedicine visit and am voluntarily participating in the telemedicine visit.  I understand that I have the right to withhold or withdraw my consent to the use of telemedicine in the course of my care at any time, without affecting my right to future care or treatment, and that the Practitioner or I may terminate the telemedicine visit at any time. I understand that I have the right to inspect all information obtained and/or recorded in the course of the telemedicine visit and may receive copies of available information for a reasonable fee.  I understand that some  of the potential risks of receiving the Services via telemedicine include:  Delay or interruption in medical evaluation due to technological equipment failure or disruption; Information transmitted may not be sufficient (e.g. poor resolution of images) to allow for appropriate medical decision making by the Practitioner; and/or  In rare instances, security protocols could fail, causing a breach of personal health information.  Furthermore, I acknowledge that it is my responsibility to provide information about my medical history, conditions and care that is complete and accurate to the best of my ability. I acknowledge that Practitioner's advice, recommendations, and/or decision may be based on factors not within their control, such as incomplete or inaccurate data provided by me or distortions of diagnostic images or specimens that may result from electronic transmissions. I understand that the practice of medicine is not an exact science and that Practitioner makes no warranties or guarantees regarding treatment outcomes. I acknowledge that a copy of this consent can be made available to me via my patient portal [facility] MyChart), or I can request a printed copy by calling the office of [facility].    I understand that my insurance will be billed for this visit.   I have read or had this consent read to me. I understand the contents of this consent, which adequately explains the benefits and risks of the Services being provided via telemedicine.  I have been provided ample opportunity to ask questions regarding this consent and the Services and have had my questions answered to my satisfaction. I give my informed consent for the  services to be provided through the use of telemedicine in my medical care

## 2023-05-21 NOTE — Telephone Encounter (Signed)
   Name: Kevin [name]  DOB: 1955/06/20  MRN: [id number]  Primary Cardiologist: Julien Nordmann, MD   Preoperative team, please contact this patient and set up a phone call appointment for further preoperative risk assessment. Please obtain consent and complete medication review. Thank you for your help.  I confirm that guidance regarding antiplatelet and oral anticoagulation therapy has been completed and, if necessary, noted below.  Per office protocol, he may hold Plavix for 5 days prior to procedure and should resume as soon as hemodynamically stable postoperatively. Patient should continue aspirin 81 mg daily throughout perioperative period.   I also confirmed the patient resides in the state of [place]. As per [facility] Medical Board telemedicine laws, the patient must reside in the state in which the provider is licensed.   Carlos Levering, NP 05/21/2023, 11:37 AM [facility]

## 2023-05-21 NOTE — Addendum Note (Signed)
Addended by: Roena Malady on: 05/21/2023 11:01 AM   Modules accepted: Orders

## 2023-05-21 NOTE — Telephone Encounter (Signed)
Pt is scheduled tele preop appt 06/04/23 for the pt.    Preop APP will call her and she will call the pt into the call.

## 2023-05-21 NOTE — Telephone Encounter (Signed)
S/w the pt's wife ([name]). She has scheduled tele preop appt 06/24/23 for the pt. She will need to call the pt in on the call as she will be at work, however she states his memory is not very good and needs help at times.    Preop APP will call her and she will call the pt into the call.    Med rec and consent are done.

## 2023-05-23 ENCOUNTER — Other Ambulatory Visit (HOSPITAL_COMMUNITY): Payer: Self-pay

## 2023-05-29 DIAGNOSIS — S92311A Displaced fracture of first metatarsal bone, right foot, initial encounter for closed fracture: Secondary | ICD-10-CM | POA: Diagnosis not present

## 2023-05-29 DIAGNOSIS — S92324A Nondisplaced fracture of second metatarsal bone, right foot, initial encounter for closed fracture: Secondary | ICD-10-CM | POA: Diagnosis not present

## 2023-05-29 DIAGNOSIS — S9781XA Crushing injury of right foot, initial encounter: Secondary | ICD-10-CM | POA: Diagnosis not present

## 2023-05-30 ENCOUNTER — Telehealth: Payer: Self-pay | Admitting: Gastroenterology

## 2023-05-30 ENCOUNTER — Telehealth: Payer: Self-pay | Admitting: Cardiovascular Disease

## 2023-05-30 NOTE — Telephone Encounter (Signed)
 Wife Lynnae Sandhoff) called to cancel tele-visit and stated patient broke his foot and his colonoscopy is being postponed.

## 2023-05-30 NOTE — Telephone Encounter (Signed)
 Called patient and talk to patient wife and she states that he does not want to cancel at this time but will reschedule when he knows more about his foot. Called endo and talk to Dava and she will get patient canceled

## 2023-05-30 NOTE — Telephone Encounter (Signed)
 Documented

## 2023-05-30 NOTE — Telephone Encounter (Signed)
 The patient wife Kevin [name]) called in and left a voicemail requesting to reschedule her husband colonoscopy with Dr. Allegra Lai. The patient broke his foot, and they are waiting to see if he is going to need surgery. I call her to let her know that I receive the patient message, I sent the message to the nurse.

## 2023-06-04 ENCOUNTER — Ambulatory Visit: Payer: 59

## 2023-06-04 DIAGNOSIS — S92311A Displaced fracture of first metatarsal bone, right foot, initial encounter for closed fracture: Secondary | ICD-10-CM | POA: Diagnosis not present

## 2023-06-04 DIAGNOSIS — S9781XA Crushing injury of right foot, initial encounter: Secondary | ICD-10-CM | POA: Diagnosis not present

## 2023-06-05 NOTE — Telephone Encounter (Signed)
 Patient canceled procedure and cardiology appointment due to breaking the foot

## 2023-06-07 ENCOUNTER — Other Ambulatory Visit: Payer: Self-pay | Admitting: Gastroenterology

## 2023-06-07 ENCOUNTER — Other Ambulatory Visit (HOSPITAL_COMMUNITY): Payer: Self-pay

## 2023-06-07 ENCOUNTER — Other Ambulatory Visit: Payer: Self-pay

## 2023-06-07 MED ORDER — PANTOPRAZOLE SODIUM 20 MG PO TBEC
20.0000 mg | DELAYED_RELEASE_TABLET | Freq: Every day | ORAL | 0 refills | Status: DC
  Filled 2023-06-07: qty 90, 90d supply, fill #0

## 2023-06-13 ENCOUNTER — Ambulatory Visit: Admit: 2023-06-13 | Payer: 59 | Admitting: Gastroenterology

## 2023-06-13 SURGERY — COLONOSCOPY WITH PROPOFOL
Anesthesia: General

## 2023-06-16 DIAGNOSIS — G4733 Obstructive sleep apnea (adult) (pediatric): Secondary | ICD-10-CM | POA: Diagnosis not present

## 2023-06-29 ENCOUNTER — Other Ambulatory Visit (HOSPITAL_COMMUNITY): Payer: Self-pay

## 2023-07-01 ENCOUNTER — Other Ambulatory Visit: Payer: Self-pay

## 2023-07-02 ENCOUNTER — Other Ambulatory Visit: Payer: Self-pay

## 2023-07-02 DIAGNOSIS — S92311D Displaced fracture of first metatarsal bone, right foot, subsequent encounter for fracture with routine healing: Secondary | ICD-10-CM | POA: Diagnosis not present

## 2023-07-02 DIAGNOSIS — S9781XA Crushing injury of right foot, initial encounter: Secondary | ICD-10-CM | POA: Diagnosis not present

## 2023-07-07 ENCOUNTER — Other Ambulatory Visit: Payer: Self-pay | Admitting: Cardiology

## 2023-07-08 ENCOUNTER — Other Ambulatory Visit: Payer: Self-pay

## 2023-07-08 ENCOUNTER — Other Ambulatory Visit (HOSPITAL_COMMUNITY): Payer: Self-pay

## 2023-07-08 MED ORDER — CLOPIDOGREL BISULFATE 75 MG PO TABS
75.0000 mg | ORAL_TABLET | Freq: Every day | ORAL | 2 refills | Status: DC
  Filled 2023-07-08: qty 90, 90d supply, fill #0
  Filled 2023-09-30: qty 90, 90d supply, fill #1
  Filled 2023-11-15 – 2023-12-07 (×3): qty 90, 90d supply, fill #2

## 2023-07-27 ENCOUNTER — Other Ambulatory Visit: Payer: Self-pay | Admitting: Physician Assistant

## 2023-07-27 DIAGNOSIS — F419 Anxiety disorder, unspecified: Secondary | ICD-10-CM

## 2023-07-29 ENCOUNTER — Other Ambulatory Visit: Payer: Self-pay | Admitting: Physician Assistant

## 2023-07-29 ENCOUNTER — Other Ambulatory Visit: Payer: Self-pay

## 2023-07-29 DIAGNOSIS — F419 Anxiety disorder, unspecified: Secondary | ICD-10-CM

## 2023-07-30 ENCOUNTER — Other Ambulatory Visit: Payer: Self-pay

## 2023-08-01 ENCOUNTER — Other Ambulatory Visit: Payer: Self-pay

## 2023-08-02 ENCOUNTER — Other Ambulatory Visit: Payer: Self-pay

## 2023-08-05 ENCOUNTER — Other Ambulatory Visit: Payer: Self-pay

## 2023-08-06 ENCOUNTER — Other Ambulatory Visit: Payer: Self-pay

## 2023-08-06 MED FILL — Alprazolam Tab 0.5 MG: ORAL | 30 days supply | Qty: 30 | Fill #0 | Status: AC

## 2023-08-11 ENCOUNTER — Other Ambulatory Visit: Payer: Self-pay | Admitting: Cardiology

## 2023-08-12 ENCOUNTER — Other Ambulatory Visit (HOSPITAL_COMMUNITY): Payer: Self-pay

## 2023-08-12 ENCOUNTER — Other Ambulatory Visit: Payer: Self-pay

## 2023-08-12 MED ORDER — RANOLAZINE ER 1000 MG PO TB12
1000.0000 mg | ORAL_TABLET | Freq: Two times a day (BID) | ORAL | 0 refills | Status: DC
  Filled 2023-08-12: qty 180, 90d supply, fill #0

## 2023-08-12 MED ORDER — METOPROLOL TARTRATE 25 MG PO TABS
25.0000 mg | ORAL_TABLET | Freq: Two times a day (BID) | ORAL | 0 refills | Status: DC
  Filled 2023-08-12: qty 180, 90d supply, fill #0

## 2023-08-13 DIAGNOSIS — S9781XA Crushing injury of right foot, initial encounter: Secondary | ICD-10-CM | POA: Diagnosis not present

## 2023-08-13 DIAGNOSIS — S92311D Displaced fracture of first metatarsal bone, right foot, subsequent encounter for fracture with routine healing: Secondary | ICD-10-CM | POA: Diagnosis not present

## 2023-08-30 ENCOUNTER — Ambulatory Visit: Admitting: Nurse Practitioner

## 2023-08-30 ENCOUNTER — Encounter: Payer: Self-pay | Admitting: Nurse Practitioner

## 2023-08-30 NOTE — Progress Notes (Deleted)
 Office Visit    Patient Name: Kevin [name] Date of Encounter: 08/30/2023  Primary Care Provider:  Ostwalt, Janna, PA-C Primary Cardiologist:  Belva Boyden, MD  Chief Complaint    68 y.o. male with a history of CAD status post four-vessel bypass and subsequent LAD stenting, paroxysmal atrial fibrillation status post Maze, PFO s/p closure, stroke with resultant memory deficits, hypertension, hyperlipidemia, end-stage 3 chronic kidney disease, who presents for follow-up related to***  Past Medical History   Subjective   Past Medical History:  Diagnosis Date   Allergy    Since child   Asthma Childhood   Brachial plexopathy    CKD (chronic kidney disease), stage II - III (HCC)    Coronary artery disease    a. 05/2008 CABGx4: LIMA-LAD, SVG-Diag, sequential SVG-OM1/OM2; b. 10/2009 s/p PCI/DES x 2 to LAD; c. 03/2013 Cath: patent LAD stents; d. 10/2016 Cath: LM min irregs, LAD 20p, 50m ISR, D2 80ost, LCX min irregs, OM1/2/3 min irregs, RCA/RPDA/RPAV min irregs, RPL1/2 nl RPL3 min irregs, VG->D2 nl, VG->OM1->OM2 100, EF 55-65%; e. 12/2017 MV: distal inflat/apical scar, no isch; f. 09/2021 MV: No isch. EF 63%.   Depression 1988   H/O maze procedure 05/17/2008   a. 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    Hypertension    Memory deficit after cerebral infarction    OSA (obstructive sleep apnea)    Overweight(278.02)    PAF (paroxysmal atrial fibrillation) (HCC)    a. s/p Maze 05/2008, previously on Eliquis->discontinued 05/2016; c. CHADS2VASc => 4 (HTN, stroke x 2, vascular disease)   Patent foramen ovale    a. 05/2008 s/p closure @ time of CABG.   Psoriatic arthritis (HCC)    Skin cancer    Sleep apnea    Stroke [facility])    a. 07/2005 right brain; b. 05/2005 left brain; c. Resultant memory deficits.   Thrombocytopenia [facility])    Past Surgical History:  Procedure Laterality Date   ACUTE PANCREATITIS  5/11    APPENDECTOMY  1984   CARDIAC CATHETERIZATION     CHOLECYSTECTOMY  08/19/2009   COLONOSCOPY WITH PROPOFOL  N/A 06/07/2017   Procedure: COLONOSCOPY WITH PROPOFOL ;  Surgeon: Irby Mannan, MD;  Location: [facility] ENDOSCOPY;  Service: Endoscopy;  Laterality: N/A;   CORONARY ARTERY BYPASS GRAFT  05/12/2008   x4 by PeterVan Trigt,MD   ESOPHAGOGASTRODUODENOSCOPY N/A 03/03/2020   Procedure: ESOPHAGOGASTRODUODENOSCOPY (EGD);  Surgeon: Irby Mannan, MD;  Location: [facility] ENDOSCOPY;  Service: Endoscopy;  Laterality: N/A;   LEFT HEART CATH AND CORONARY ANGIOGRAPHY N/A 11/29/2016   Procedure: LEFT HEART CATH AND CORONARY ANGIOGRAPHY;  Surgeon: Wenona Hamilton, MD;  Location: [facility] INVASIVE CV LAB;  Service: Cardiovascular;  Laterality: N/A;   PATENT FORAMEN OVALE CLOSURE     TONSILLECTOMY     AS A CHILD    Allergies  Allergies  Allergen Reactions   Strawberry Extract Anaphylaxis        Dilaudid [Hydromorphone Hcl] Nausea And Vomiting       History of Present Illness      68 y.o. y/o male with the above complex past medical history including CAD, paroxysmal atrial fibrillation, PFO, strokes, hypertension, hyperlipidemia, and stage II - III chronic kidney disease.  Cardiac history dates back to February 2010, when he underwent CABG x4.  At the time, he also underwent maze and PFO closure.  Due to recurrent  angina, he required diagnostic catheterization in August 2011 revealing an atretic LIMA to the LAD and an occlusion of the sequential graft to the OM1 and OM 2.  At that time, he underwent PCI and drug-eluting stent placement to the native LAD.  He underwent diagnostic catheterization in August 2018 due to recurrent rest and exertional chest discomfort. This showed stable anatomy with patent LAD stents as well as patency of the vein graft to the diagonal and native RCA.   In the setting of recurrent angina following changing Ranexa  therapy from namebrand to generic, he underwent stress testing  which showed a small region of mild fixed perfusion defect involving the distal inferolateral and apical inferolateral walls, consistent with prior scar.  There was no ischemia.  Repeat stress testing was performed in July 2023 showing no evidence of ischemia with an EF of 63%.   Kevin [name] was last seen in cardiology clinic in May 2024, at which time his wife pointed out that he again was having some chest discomfort after being placed to a generic version of ranolazine . Objective   Home Medications    Current Outpatient Medications  Medication Sig Dispense Refill   albuterol  (VENTOLIN  HFA) 108 (90 Base) MCG/ACT inhaler Inhale 2 puffs into the lungs every 6 (six) hours as needed for wheezing or shortness of breath. 8 g 0   ALPRAZolam  (XANAX ) 0.5 MG tablet Take 1 tablet (0.5 mg total) by mouth at bedtime. 30 tablet 0   ARTHRITIS PAIN RELIEF 650 MG CR tablet Take 650 mg by mouth every 8 (eight) hours as needed.     aspirin  EC 81 MG tablet Take 81 mg by mouth daily.     cetirizine (ZYRTEC) 10 MG tablet Take 10 mg by mouth at bedtime.     clobetasol  ointment (TEMOVATE ) 0.05 % Apply 1 Application topically 2 (two) times daily until smooth, then stop and use as needed. 30 g 2   clopidogrel  (PLAVIX ) 75 MG tablet Take 1 tablet (75 mg total) by mouth daily. 90 tablet 2   COVID-19 mRNA bivalent vaccine, Pfizer, (PFIZER COVID-19 VAC BIVALENT) injection Inject into the muscle. (Patient not taking: Reported on 05/21/2023) 0.3 mL 0   COVID-19 mRNA vaccine 2023-2024 (COMIRNATY ) syringe Inject into the muscle. 0.3 mL 0   desonide  (DESOWEN ) 0.05 % cream Apply 1 application topically 2 (two) times daily as needed (for psoriasis). (Patient not taking: Reported on 05/21/2023) 30 g 2   desonide  (DESOWEN ) 0.05 % cream Apply 1 Application topically 2 (two) times daily to affected areas on face and neck when flared. 30 g 2   ezetimibe  (ZETIA ) 10 MG tablet Take 1 tablet (10 mg total) by mouth daily. (pls call to sch 12  month follow up [phone]) 90 tablet 3   isosorbide  mononitrate (IMDUR ) 30 MG 24 hr tablet Take 1 tablet (30 mg total) by mouth daily. 90 tablet 3   ketoconazole  (NIZORAL ) 2 % shampoo Apply 1 Application topically to affected area (leave on for 5 minutes) , rinse well, use 2-3 times per week. 120 mL 11   LEXAPRO  20 MG tablet Take 1 tablet (20 mg total) by mouth daily. 90 tablet 3   metoprolol  tartrate (LOPRESSOR ) 25 MG tablet Take 1 tablet (25 mg total) by mouth 2 (two) times daily. 180 tablet 0   nitroGLYCERIN  (NITROSTAT ) 0.4 MG SL tablet PLACE 1 TABLET UNDER THE TONGUE EVERY 5 MINUTES AS NEEDED. MAY REPEAT FOR UP TO 3 DOSES. IF NO RELIEF WITH 1ST DOSE, CALL  911. 25 tablet 0   pantoprazole  (PROTONIX ) 20 MG tablet Take 1 tablet (20 mg total) by mouth daily. 90 tablet 0   ranolazine  (RANEXA ) 1000 MG SR tablet Take 1 tablet (1,000 mg total) by mouth 2 (two) times daily. 180 tablet 0   rosuvastatin  (CRESTOR ) 40 MG tablet Take 1 tablet (40 mg total) by mouth daily. 90 tablet 3   tacrolimus  (PROTOPIC ) 0.1 % ointment Apply twice daily to affected areas as needed 30 g 2   No current facility-administered medications for this visit.     Physical Exam    VS:  There were no vitals taken for this visit. , BMI There is no height or weight on file to calculate BMI.       GEN: Well nourished, well developed, in no acute distress. HEENT: normal. Neck: Supple, no JVD, carotid bruits, or masses. Cardiac: RRR, no murmurs, rubs, or gallops. No clubbing, cyanosis, edema.  Radials 2+/PT 2+ and equal bilaterally.  Respiratory:  Respirations regular and unlabored, clear to auscultation bilaterally. GI: Soft, nontender, nondistended, BS + x 4. MS: no deformity or atrophy. Skin: warm and dry, no rash. Neuro:  Strength and sensation are intact. Psych: Normal affect.  Accessory Clinical Findings    ECG personally reviewed by me today -    *** - no acute changes.  Lab Results  Component Value Date   WBC  6.5 01/24/2023   HGB 12.9 (L) 01/24/2023   HCT 40.6 01/24/2023   MCV 100 (H) 01/24/2023   PLT 186 01/24/2023   Lab Results  Component Value Date   CREATININE 1.34 (H) 01/24/2023   BUN 21 01/24/2023   NA 141 01/24/2023   K 4.7 01/24/2023   CL 104 01/24/2023   CO2 25 01/24/2023   Lab Results  Component Value Date   ALT 12 01/24/2023   AST 16 01/24/2023   ALKPHOS 43 (L) 01/24/2023   BILITOT 0.6 01/24/2023   Lab Results  Component Value Date   CHOL 114 01/24/2023   HDL 61 01/24/2023   LDLCALC 40 01/24/2023   TRIG 57 01/24/2023   CHOLHDL 1.9 01/24/2023    Lab Results  Component Value Date   HGBA1C  05/11/2008    5.5 (NOTE)   The ADA recommends the following therapeutic goal for glycemic   control related to Hgb A1C measurement:   Goal of Therapy:   < 7.0% Hgb A1C   Reference: American Diabetes Association: Clinical Practice   Recommendations 2008, Diabetes Care,  2008, 31:(Suppl 1).   Lab Results  Component Value Date   TSH 2.210 12/08/2020       Assessment & Plan    1.  ***  Laneta Pintos, NP 08/30/2023, 3:00 PM

## 2023-09-02 ENCOUNTER — Other Ambulatory Visit: Payer: Self-pay

## 2023-09-02 ENCOUNTER — Other Ambulatory Visit (HOSPITAL_COMMUNITY): Payer: Self-pay

## 2023-09-10 ENCOUNTER — Other Ambulatory Visit: Payer: Self-pay | Admitting: Gastroenterology

## 2023-09-14 ENCOUNTER — Other Ambulatory Visit (HOSPITAL_BASED_OUTPATIENT_CLINIC_OR_DEPARTMENT_OTHER): Payer: Self-pay

## 2023-09-14 ENCOUNTER — Other Ambulatory Visit (HOSPITAL_COMMUNITY): Payer: Self-pay

## 2023-09-14 ENCOUNTER — Other Ambulatory Visit: Payer: Self-pay

## 2023-09-14 ENCOUNTER — Other Ambulatory Visit: Payer: Self-pay | Admitting: Gastroenterology

## 2023-09-14 ENCOUNTER — Other Ambulatory Visit: Payer: Self-pay | Admitting: Physician Assistant

## 2023-09-14 ENCOUNTER — Other Ambulatory Visit: Payer: Self-pay | Admitting: Cardiology

## 2023-09-14 DIAGNOSIS — F419 Anxiety disorder, unspecified: Secondary | ICD-10-CM

## 2023-09-16 ENCOUNTER — Other Ambulatory Visit: Payer: Self-pay

## 2023-09-17 ENCOUNTER — Other Ambulatory Visit: Payer: Self-pay | Admitting: Gastroenterology

## 2023-09-17 ENCOUNTER — Other Ambulatory Visit: Payer: Self-pay

## 2023-09-17 MED FILL — Alprazolam Tab 0.5 MG: ORAL | 30 days supply | Qty: 30 | Fill #0 | Status: AC

## 2023-09-18 ENCOUNTER — Other Ambulatory Visit: Payer: Self-pay

## 2023-09-19 ENCOUNTER — Other Ambulatory Visit: Payer: Self-pay | Admitting: Gastroenterology

## 2023-09-19 ENCOUNTER — Other Ambulatory Visit (HOSPITAL_COMMUNITY): Payer: Self-pay

## 2023-09-19 ENCOUNTER — Other Ambulatory Visit: Payer: Self-pay

## 2023-09-20 NOTE — Telephone Encounter (Signed)
 Pt's spouse called requesting refill... She states their insurance will not cover [facility] so he cannot transfer care to [facility]. Pt saw you Feb 2025. Can you please send in refills for the pt while they figure out where they can transfer care to?

## 2023-09-20 NOTE — Addendum Note (Signed)
 Addended by: Lovie Rudder on: 09/20/2023 10:50 AM   Modules accepted: Orders

## 2023-09-21 ENCOUNTER — Other Ambulatory Visit (HOSPITAL_COMMUNITY): Payer: Self-pay

## 2023-09-23 ENCOUNTER — Other Ambulatory Visit: Payer: Self-pay

## 2023-09-23 ENCOUNTER — Other Ambulatory Visit (HOSPITAL_COMMUNITY): Payer: Self-pay

## 2023-09-23 MED ORDER — PANTOPRAZOLE SODIUM 20 MG PO TBEC
20.0000 mg | DELAYED_RELEASE_TABLET | Freq: Every day | ORAL | 1 refills | Status: DC
  Filled 2023-09-23: qty 90, 90d supply, fill #0
  Filled 2023-11-15 – 2023-12-07 (×3): qty 90, 90d supply, fill #1

## 2023-09-30 ENCOUNTER — Other Ambulatory Visit (HOSPITAL_COMMUNITY): Payer: Self-pay

## 2023-09-30 ENCOUNTER — Other Ambulatory Visit: Payer: Self-pay

## 2023-10-01 ENCOUNTER — Ambulatory Visit: Admitting: Podiatry

## 2023-10-01 ENCOUNTER — Telehealth: Payer: Self-pay

## 2023-10-01 DIAGNOSIS — Q667 Congenital pes cavus, unspecified foot: Secondary | ICD-10-CM

## 2023-10-01 DIAGNOSIS — Q6672 Congenital pes cavus, left foot: Secondary | ICD-10-CM

## 2023-10-01 DIAGNOSIS — Q6671 Congenital pes cavus, right foot: Secondary | ICD-10-CM | POA: Diagnosis not present

## 2023-10-01 NOTE — Progress Notes (Unsigned)
 Subjective:  Patient ID: Kevin [name], male    DOB: 1955/06/20,  MRN: [id number]  Chief Complaint  Patient presents with   Foot Pain    Pt stated that back in  February he broke his foot he stated that the bones have healed but it was recommended that he get an orthotic to help with his feet     68 y.o. male presents with the above complaint.  Presents with complaint of pes cavus foot structure.  His previous pair of orthotics are getting worn out he would like to do another pair denies any other acute complaints   Review of Systems: Negative except as noted in the HPI. Denies N/V/F/Ch.  Past Medical History:  Diagnosis Date   Allergy    Since child   Asthma Childhood   Brachial plexopathy    CKD (chronic kidney disease), stage II - III (HCC)    Coronary artery disease    a. 05/2008 CABGx4: LIMA-LAD, SVG-Diag, sequential SVG-OM1/OM2; b. 10/2009 s/p PCI/DES x 2 to LAD; c. 03/2013 Cath: patent LAD stents; d. 10/2016 Cath: LM min irregs, LAD 20p, 67m ISR, D2 80ost, LCX min irregs, OM1/2/3 min irregs, RCA/RPDA/RPAV min irregs, RPL1/2 nl RPL3 min irregs, VG->D2 nl, VG->OM1->OM2 100, EF 55-65%; e. 12/2017 MV: distal inflat/apical scar, no isch; f. 09/2021 MV: No isch. EF 63%.   Depression 1988   H/O maze procedure 05/17/2008   a. 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    Hypertension    Memory deficit after cerebral infarction    OSA (obstructive sleep apnea)    Overweight(278.02)    PAF (paroxysmal atrial fibrillation) (HCC)    a. s/p Maze 05/2008, previously on Eliquis->discontinued 05/2016; c. CHADS2VASc => 4 (HTN, stroke x 2, vascular disease)   Patent foramen ovale    a. 05/2008 s/p closure @ time of CABG.   Psoriatic arthritis (HCC)    Skin cancer    Sleep apnea    Stroke [facility])    a. 07/2005 right brain; b. 05/2005 left brain; c. Resultant memory deficits.   Thrombocytopenia (HCC)     Current Outpatient  Medications:    albuterol  (VENTOLIN  HFA) 108 (90 Base) MCG/ACT inhaler, Inhale 2 puffs into the lungs every 6 (six) hours as needed for wheezing or shortness of breath., Disp: 8 g, Rfl: 0   ALPRAZolam  (XANAX ) 0.5 MG tablet, Take 1 tablet (0.5 mg total) by mouth at bedtime., Disp: 30 tablet, Rfl: 0   ARTHRITIS PAIN RELIEF 650 MG CR tablet, Take 650 mg by mouth every 8 (eight) hours as needed., Disp: , Rfl:    aspirin  EC 81 MG tablet, Take 81 mg by mouth daily., Disp: , Rfl:    cetirizine (ZYRTEC) 10 MG tablet, Take 10 mg by mouth at bedtime., Disp: , Rfl:    clobetasol  ointment (TEMOVATE ) 0.05 %, Apply 1 Application topically 2 (two) times daily until smooth, then stop and use as needed., Disp: 30 g, Rfl: 2   clopidogrel  (PLAVIX ) 75 MG tablet, Take 1 tablet (75 mg total) by mouth daily., Disp: 90 tablet, Rfl: 2   COVID-19 mRNA bivalent vaccine, Pfizer, (PFIZER COVID-19 VAC BIVALENT) injection, Inject into the muscle. (Patient not taking: Reported on 05/21/2023), Disp: 0.3 mL, Rfl: 0   COVID-19 mRNA vaccine 2023-2024 (COMIRNATY ) syringe, Inject into the muscle., Disp: 0.3 mL, Rfl: 0   desonide  (DESOWEN ) 0.05 % cream,  Apply 1 application topically 2 (two) times daily as needed (for psoriasis). (Patient not taking: Reported on 05/21/2023), Disp: 30 g, Rfl: 2   desonide  (DESOWEN ) 0.05 % cream, Apply 1 Application topically 2 (two) times daily to affected areas on face and neck when flared., Disp: 30 g, Rfl: 2   ezetimibe  (ZETIA ) 10 MG tablet, Take 1 tablet (10 mg total) by mouth daily. (pls call to sch 12 month follow up [phone]), Disp: 90 tablet, Rfl: 3   isosorbide  mononitrate (IMDUR ) 30 MG 24 hr tablet, Take 1 tablet (30 mg total) by mouth daily., Disp: 90 tablet, Rfl: 3   ketoconazole  (NIZORAL ) 2 % shampoo, Apply 1 Application topically to affected area (leave on for 5 minutes) , rinse well, use 2-3 times per week., Disp: 120 mL, Rfl: 11   LEXAPRO  20 MG tablet, Take 1 tablet (20 mg total) by mouth  daily., Disp: 90 tablet, Rfl: 3   metoprolol  tartrate (LOPRESSOR ) 25 MG tablet, Take 1 tablet (25 mg total) by mouth 2 (two) times daily., Disp: 180 tablet, Rfl: 0   nitroGLYCERIN  (NITROSTAT ) 0.4 MG SL tablet, PLACE 1 TABLET UNDER THE TONGUE EVERY 5 MINUTES AS NEEDED. MAY REPEAT FOR UP TO 3 DOSES. IF NO RELIEF WITH 1ST DOSE, CALL 911., Disp: 25 tablet, Rfl: 0   pantoprazole  (PROTONIX ) 20 MG tablet, Take 1 tablet (20 mg total) by mouth daily., Disp: 90 tablet, Rfl: 1   ranolazine  (RANEXA ) 1000 MG SR tablet, Take 1 tablet (1,000 mg total) by mouth 2 (two) times daily., Disp: 180 tablet, Rfl: 0   rosuvastatin  (CRESTOR ) 40 MG tablet, Take 1 tablet (40 mg total) by mouth daily., Disp: 90 tablet, Rfl: 3   tacrolimus  (PROTOPIC ) 0.1 % ointment, Apply twice daily to affected areas as needed, Disp: 30 g, Rfl: 2  Social History   Tobacco Use  Smoking Status Never  Smokeless Tobacco Never    Allergies  Allergen Reactions   Strawberry Extract Anaphylaxis        Dilaudid [Hydromorphone Hcl] Nausea And Vomiting   Objective:  There were no vitals filed for this visit. There is no height or weight on file to calculate BMI. Constitutional Well developed. Well nourished.  Vascular Dorsalis pedis pulses palpable bilaterally. Posterior tibial pulses palpable bilaterally. Capillary refill normal to all digits.  No cyanosis or clubbing noted. Pedal hair growth normal.  Neurologic Normal speech. Oriented to person, place, and time. Epicritic sensation to light touch grossly present bilaterally.  Dermatologic Nails well groomed and normal in appearance. No open wounds. No skin lesions.  Orthopedic: Pes cavus foot structure noted semirigid in nature.  Nonreducible with Coleman block test.  Submet 1 pain noted slightly.   Radiographs:3 views of skeletally mature adult right foot: No fractures noted.  Sesamoidal complex intact.  No bony abnormalities identified.  Pes cavus foot structure noted with  decreasing calcaneal clinician angle bullet hole sinus tarsi decrease in talar declination angle.  Bipartite sesamoids noted. Assessment:   1. Pes cavus      Plan:  Patient was evaluated and treated and all questions answered.  Pes cavus -I explained the patient the etiology of pes cavus foot structure and various treatment options were discussed.  Given the amount of pain that he is having I believe patient will benefit from custom-made orthotics to help support the high arch foot structure with offloading of the first metatarsal with dancers pad to take the stress off of the sesamoid.  Patient states understand would like to  obtain orthotics - Patient was casted for orthotics previous pair is worn out   No follow-ups on file.

## 2023-10-01 NOTE — Telephone Encounter (Signed)
 Needs PA  Copied from CRM [id number]. Topic: Clinical - Prescription Issue >> Sep 30, 2023  1:53 PM Sasha H wrote: Reason for CRM: [employer] team [employer] needs clinical information about LEXAPRO  20 MG tablet for this pt. Call back is [phone] option 2

## 2023-10-02 ENCOUNTER — Telehealth: Payer: Self-pay

## 2023-10-02 ENCOUNTER — Other Ambulatory Visit (HOSPITAL_COMMUNITY): Payer: Self-pay

## 2023-10-02 NOTE — Telephone Encounter (Signed)
 Pharmacy Patient Advocate Encounter   Received notification from Pt Calls Messages that prior authorization for Lexapro  20mg  is required/requested.   Insurance verification completed.   The patient is insured through [facility] ADVANTAGE/RX ADVANCE .   Per test claim:  Escitalopram  20mg  is preferred by the insurance.  If suggested medication is appropriate, Please send in a new RX and discontinue this one. If not, please advise as to why it's not appropriate so that we may request a Prior Authorization. Please note, some preferred medications may still require a PA.  If the suggested medications have not been trialed and there are no contraindications to their use, the PA will not be submitted, as it will not be approved.  GENERIC WAS FILLED 09/14/2023 AND WILL FILL AGAIN 10/07/2023

## 2023-10-02 NOTE — Telephone Encounter (Signed)
 Per test claim, pt was able to fill generic lexapro  (escitalopram ) 20 mg on 09/14/2023, and the next fill date is 10/07/2023. Is patient needing brand name specifically? If so, please provide clinical reason to support PA. Thank you.

## 2023-10-09 ENCOUNTER — Ambulatory Visit: Payer: Self-pay | Attending: Nurse Practitioner | Admitting: Nurse Practitioner

## 2023-10-09 ENCOUNTER — Encounter: Payer: Self-pay | Admitting: Nurse Practitioner

## 2023-10-09 VITALS — BP 118/60 | HR 49 | Ht 66.0 in | Wt 203.8 lb

## 2023-10-09 DIAGNOSIS — R001 Bradycardia, unspecified: Secondary | ICD-10-CM | POA: Diagnosis not present

## 2023-10-09 DIAGNOSIS — I25119 Atherosclerotic heart disease of native coronary artery with unspecified angina pectoris: Secondary | ICD-10-CM

## 2023-10-09 DIAGNOSIS — I480 Paroxysmal atrial fibrillation: Secondary | ICD-10-CM

## 2023-10-09 DIAGNOSIS — E785 Hyperlipidemia, unspecified: Secondary | ICD-10-CM | POA: Diagnosis not present

## 2023-10-09 DIAGNOSIS — I10 Essential (primary) hypertension: Secondary | ICD-10-CM

## 2023-10-09 MED ORDER — METOPROLOL TARTRATE 25 MG PO TABS: 12.5000 mg | ORAL_TABLET | Freq: Two times a day (BID) | ORAL | Status: DC

## 2023-10-09 NOTE — Progress Notes (Signed)
 Office Visit    Patient Name: Kevin [name] Date of Encounter: 10/09/2023  Primary Care Provider:  Ostwalt, Janna, PA-C Primary Cardiologist:  Kevin Lunger, MD  Cardiology APP:  Kevin Lonni Ingle, NP   Chief Complaint    68 y.o. male with a history of CAD status post four-vessel bypass and subsequent LAD stenting, paroxysmal atrial fibrillation status post Maze procedure, PFO s/p closure, stroke with resultant memory deficits, hypertension, hyperlipidemia, CKD 3, and obstructive sleep apnea, who presents for CAD follow-up.  Past Medical History   Subjective   Past Medical History:  Diagnosis Date   Allergy    Since child   Asthma Childhood   Brachial plexopathy    CKD (chronic kidney disease), stage II - III (HCC)    Coronary artery disease    a. 05/2008 CABGx4: LIMA-LAD, SVG-Diag, sequential SVG-OM1/OM2; b. 10/2009 s/p PCI/DES x 2 to LAD; c. 03/2013 Cath: patent LAD stents; d. 10/2016 Cath: LM min irregs, LAD 20p, 76m ISR, D2 80ost, LCX min irregs, OM1/2/3 min irregs, RCA/RPDA/RPAV min irregs, RPL1/2 nl RPL3 min irregs, VG->D2 nl, VG->OM1->OM2 100, EF 55-65%; e. 12/2017 MV: distal inflat/apical scar, no isch; f. 09/2021 MV: No isch. EF 63%.   Depression 1988   H/O maze procedure 05/17/2008   a. 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    Hypertension    Memory deficit after cerebral infarction    OSA (obstructive sleep apnea)    Overweight(278.02)    PAF (paroxysmal atrial fibrillation) (HCC)    a. s/p Maze 05/2008, previously on Eliquis->discontinued 05/2016; c. CHADS2VASc => 4 (HTN, stroke x 2, vascular disease)   Patent foramen ovale    a. 05/2008 s/p closure @ time of CABG.   Psoriatic arthritis (HCC)    Skin cancer    Sleep apnea    Stroke [facility])    a. 07/2005 right brain; b. 05/2005 left brain; c. Resultant memory deficits.   Thrombocytopenia [place])    Past Surgical History:  Procedure  Laterality Date   ACUTE PANCREATITIS  5/11   APPENDECTOMY  1984   CARDIAC CATHETERIZATION     CHOLECYSTECTOMY  08/19/2009   COLONOSCOPY WITH PROPOFOL  N/A 06/07/2017   Procedure: COLONOSCOPY WITH PROPOFOL ;  Surgeon: Kevin Keene NOVAK, MD;  Location: [facility] ENDOSCOPY;  Service: Endoscopy;  Laterality: N/A;   CORONARY ARTERY BYPASS GRAFT  05/12/2008   x4 by Kevin Trigt,MD   ESOPHAGOGASTRODUODENOSCOPY N/A 03/03/2020   Procedure: ESOPHAGOGASTRODUODENOSCOPY (EGD);  Surgeon: Kevin Keene NOVAK, MD;  Location: [facility] ENDOSCOPY;  Service: Endoscopy;  Laterality: N/A;   LEFT HEART CATH AND CORONARY ANGIOGRAPHY N/A 11/29/2016   Procedure: LEFT HEART CATH AND CORONARY ANGIOGRAPHY;  Surgeon: Kevin Deatrice LABOR, MD;  Location: [facility] INVASIVE CV LAB;  Service: Cardiovascular;  Laterality: N/A;   PATENT FORAMEN OVALE CLOSURE     TONSILLECTOMY     AS A CHILD    Allergies  Allergies  Allergen Reactions   Strawberry Extract Anaphylaxis        Dilaudid [Hydromorphone Hcl] Nausea And Vomiting       History of Present Illness      68 y.o. y/o male with a history of CAD status post four-vessel bypass and subsequent LAD stenting, paroxysmal atrial fibrillation status post Maze procedure, PFO s/p closure, stroke with resultant memory deficits, hypertension, hyperlipidemia, CKD 3, and obstructive sleep apnea.  Cardiac history dates back to  February 2010, when he underwent CABG x4. At the time, he also underwent maze and PFO closure. Due to recurrent angina, he required diagnostic catheterization in August 2011 revealing an atretic LIMA to the LAD and an occlusion of the sequential graft to the OM1 and OM 2. At that time, he underwent PCI and drug-eluting stent placement to the native LAD. He has had intermittent chest pain over the years and has been maintained on long-acting nitrate and Ranexa  therapy. He underwent diagnostic catheterization in August 2018 due to recurrent rest and exertional chest discomfort.  This showed stable anatomy with patent LAD stents as well as patency of the vein graft to the diagonal and native RCA.   In the setting of recurrent chest pain, he underwent stress testing in September 2019 and again in July 2023, both times showing normal LV function without ischemia.   Kevin [name] was last seen in cardiology clinic in May 2024, at which time he was doing well.  Over the past year, he has cont to do well.  He cont to work and is very active at his job w/o symptoms or limitations.  He has a long h/o intermittent chest tightness that occurs most evenings, and is overall unchanged.  He also has chronic dyspnea on exertion at higher levels of activity such as when he drags his to garbage cans up his driveway, though both sets of symptoms resolved in short order.  His heart rate is 49 today.  He denies any presyncope/syncope, palpitations, PND, orthopnea, edema, or early satiety. Objective   Home Medications    Current Outpatient Medications  Medication Sig Dispense Refill   albuterol  (VENTOLIN  HFA) 108 (90 Base) MCG/ACT inhaler Inhale 2 puffs into the lungs every 6 (six) hours as needed for wheezing or shortness of breath. 8 g 0   ALPRAZolam  (XANAX ) 0.5 MG tablet Take 1 tablet (0.5 mg total) by mouth at bedtime. 30 tablet 0   ARTHRITIS PAIN RELIEF 650 MG CR tablet Take 650 mg by mouth every 8 (eight) hours as needed.     aspirin  EC 81 MG tablet Take 81 mg by mouth daily.     cetirizine (ZYRTEC) 10 MG tablet Take 10 mg by mouth at bedtime.     clobetasol  ointment (TEMOVATE ) 0.05 % Apply 1 Application topically 2 (two) times daily until smooth, then stop and use as needed. 30 g 2   clopidogrel  (PLAVIX ) 75 MG tablet Take 1 tablet (75 mg total) by mouth daily. 90 tablet 2   COVID-19 mRNA bivalent vaccine, Pfizer, (PFIZER COVID-19 VAC BIVALENT) injection Inject into the muscle. 0.3 mL 0   COVID-19 mRNA vaccine 2023-2024 (COMIRNATY ) syringe Inject into the muscle. 0.3 mL 0   desonide   (DESOWEN ) 0.05 % cream Apply 1 application topically 2 (two) times daily as needed (for psoriasis). 30 g 2   desonide  (DESOWEN ) 0.05 % cream Apply 1 Application topically 2 (two) times daily to affected areas on face and neck when flared. 30 g 2   ezetimibe  (ZETIA ) 10 MG tablet Take 1 tablet (10 mg total) by mouth daily. (pls call to sch 12 month follow up [phone]) 90 tablet 3   isosorbide  mononitrate (IMDUR ) 30 MG 24 hr tablet Take 1 tablet (30 mg total) by mouth daily. 90 tablet 3   ketoconazole  (NIZORAL ) 2 % shampoo Apply 1 Application topically to affected area (leave on for 5 minutes) , rinse well, use 2-3 times per week. 120 mL 11   LEXAPRO  20 MG tablet  Take 1 tablet (20 mg total) by mouth daily. 90 tablet 3   nitroGLYCERIN  (NITROSTAT ) 0.4 MG SL tablet PLACE 1 TABLET UNDER THE TONGUE EVERY 5 MINUTES AS NEEDED. MAY REPEAT FOR UP TO 3 DOSES. IF NO RELIEF WITH 1ST DOSE, CALL 911. 25 tablet 0   pantoprazole  (PROTONIX ) 20 MG tablet Take 1 tablet (20 mg total) by mouth daily. 90 tablet 1   ranolazine  (RANEXA ) 1000 MG SR tablet Take 1 tablet (1,000 mg total) by mouth 2 (two) times daily. 180 tablet 0   rosuvastatin  (CRESTOR ) 40 MG tablet Take 1 tablet (40 mg total) by mouth daily. 90 tablet 3   tacrolimus  (PROTOPIC ) 0.1 % ointment Apply twice daily to affected areas as needed 30 g 2   metoprolol  tartrate (LOPRESSOR ) 25 MG tablet Take 0.5 tablets (12.5 mg total) by mouth 2 (two) times daily.     No current facility-administered medications for this visit.     Physical Exam    VS:  BP 118/60 (BP Location: Left Arm, Patient Position: Sitting, Cuff Size: Normal)   Pulse (!) 49   Ht 5' 6 (1.676 m)   Wt 203 lb 12.8 oz (92.4 kg)   SpO2 98%   BMI 32.89 kg/m  , BMI Body mass index is 32.89 kg/m.          GEN: Well nourished, well developed, in no acute distress. HEENT: normal. Neck: Supple, no JVD, carotid bruits, or masses. Cardiac: RRR, no murmurs, rubs, or gallops. No clubbing,  cyanosis, edema.  Radials 2+/PT 2+ and equal bilaterally.  Respiratory:  Respirations regular and unlabored, clear to auscultation bilaterally. GI: Soft, nontender, nondistended, BS + x 4. MS: no deformity or atrophy. Skin: warm and dry, no rash. Neuro:  Strength and sensation are intact. Psych: Normal affect.  Accessory Clinical Findings    ECG personally reviewed by me today - EKG Interpretation Date/Time:  Wednesday October 09 2023 09:04:51 EDT Ventricular Rate:  49 PR Interval:  210 QRS Duration:  102 QT Interval:  474 QTC Calculation: 428 R Axis:   206  Text Interpretation: Sinus bradycardia First degree A-V block Right superior axis deviation When compared with ECG of 24-Mar-2017 17:13, Junctional rhythm has replaced Sinus rhythm QRS axis Shifted left T wave inversion no longer evident in Anterior leads Reconfirmed by Kevin Bruckner [phone]) on 10/09/2023 1:09:50 PM  - no acute changes.  Lab Results  Component Value Date   WBC 6.5 01/24/2023   HGB 12.9 (L) 01/24/2023   HCT 40.6 01/24/2023   MCV 100 (H) 01/24/2023   PLT 186 01/24/2023   Lab Results  Component Value Date   CREATININE 1.34 (H) 01/24/2023   BUN 21 01/24/2023   NA 141 01/24/2023   K 4.7 01/24/2023   CL 104 01/24/2023   CO2 25 01/24/2023   Lab Results  Component Value Date   ALT 12 01/24/2023   AST 16 01/24/2023   ALKPHOS 43 (L) 01/24/2023   BILITOT 0.6 01/24/2023   Lab Results  Component Value Date   CHOL 114 01/24/2023   HDL 61 01/24/2023   LDLCALC 40 01/24/2023   TRIG 57 01/24/2023   CHOLHDL 1.9 01/24/2023    Lab Results  Component Value Date   HGBA1C  05/11/2008    5.5 (NOTE)   The ADA recommends the following therapeutic goal for glycemic   control related to Hgb A1C measurement:   Goal of Therapy:   < 7.0% Hgb A1C   Reference: American Diabetes Association: Clinical  Practice   Recommendations 2008, Diabetes Care,  2008, 31:(Suppl 1).   Lab Results  Component Value Date   TSH 2.210  12/08/2020       Assessment & Plan    1.  CAD status post CABG x 4 in 2010 with subsequent drug-eluting stent placement to the LAD in August 2011 (LIMA to the LAD was atretic at that time).  He has somewhat chronic intermittent chest discomfort occurring most evenings, typically with rest, which is unchanged dating back to stress testing in July 2023.  He is very active throughout his day without symptoms or limitations and overall, has been doing well.  He remains on aspirin , Plavix , nitrate, Zetia , Ranexa , and statin therapy.  In the setting of sinus bradycardia with a first-degree AV block and heart rate of 49, I am reducing his metoprolol  to 12.5 mg twice daily.  2.  Sinus bradycardia: Heart rate 49 with first-degree AV block.  Asymptomatic.  Reducing metoprolol  to 12.5 mg twice daily.  He and his wife will continue to monitor his heart rate via a pulse oximeter at home and will contact us  within the next few weeks to report trends or symptoms.  3.  Primary hypertension: Blood pressure stable at 118/60 on beta-blocker and nitrate therapy.  4.  Hyperlipidemia: LDL 40 last October.  He remains on statin and Zetia  therapy.  5.  Paroxysmal atrial fibrillation: Status post Maze procedure at the time of his CABG.  Maintaining sinus rhythm-reducing beta-blocker in the setting of bradycardia.  He is on aspirin  and Plavix  only.  6.  History of stroke: History of memory loss but otherwise doing well.  Remains on aspirin , Plavix , and statin therapy.  7.  History of PFO: Status post closure.  8.  Disposition: Follow-up in 6 months or sooner if necessary.  Lonni Meager, NP 10/09/2023, 1:10 PM

## 2023-10-09 NOTE — Patient Instructions (Addendum)
 Medication Instructions:  Decrease Metoprolol  12.5 mg BID  *If you need a refill on your cardiac medications before your next appointment, please call your pharmacy*  Follow-Up: At [facility], you and your health needs are our priority.  As part of our continuing mission to provide you with exceptional heart care, our providers are all part of one team.  This team includes your primary Cardiologist (physician) and Advanced Practice Providers or APPs (Physician Assistants and Nurse Practitioners) who all work together to provide you with the care you need, when you need it.  Your next appointment:   6 month(s)  Provider:   Timothy Gollan, MD or Lonni Meager, NP  We recommend signing up for the patient portal called MyChart.  Sign up information is provided on this After Visit Summary.  MyChart is used to connect with patients for Virtual Visits (Telemedicine).  Patients are able to view lab/test results, encounter notes, upcoming appointments, etc.  Non-urgent messages can be sent to your provider as well.   To learn more about what you can do with MyChart, go to [email or web address].

## 2023-10-10 NOTE — Progress Notes (Signed)
  Orthotic order placed will schedule for fitting when in

## 2023-10-18 ENCOUNTER — Other Ambulatory Visit: Payer: Self-pay | Admitting: Cardiology

## 2023-10-18 ENCOUNTER — Other Ambulatory Visit (HOSPITAL_COMMUNITY): Payer: Self-pay

## 2023-10-18 MED ORDER — EZETIMIBE 10 MG PO TABS
10.0000 mg | ORAL_TABLET | Freq: Every day | ORAL | 3 refills | Status: AC
  Filled 2023-10-18: qty 90, 90d supply, fill #0
  Filled 2024-01-24: qty 90, 90d supply, fill #1
  Filled 2024-04-12: qty 90, 90d supply, fill #2

## 2023-10-18 MED ORDER — ROSUVASTATIN CALCIUM 40 MG PO TABS
40.0000 mg | ORAL_TABLET | Freq: Every day | ORAL | 3 refills | Status: AC
  Filled 2023-10-18: qty 90, 90d supply, fill #0
  Filled 2024-01-19 – 2024-01-24 (×2): qty 90, 90d supply, fill #1
  Filled 2024-04-12: qty 90, 90d supply, fill #2

## 2023-10-18 MED ORDER — ISOSORBIDE MONONITRATE ER 30 MG PO TB24
30.0000 mg | ORAL_TABLET | Freq: Every day | ORAL | 3 refills | Status: AC
  Filled 2023-10-18: qty 90, 90d supply, fill #0
  Filled 2024-01-19 – 2024-01-24 (×2): qty 90, 90d supply, fill #1
  Filled 2024-04-12: qty 90, 90d supply, fill #2

## 2023-10-19 ENCOUNTER — Other Ambulatory Visit: Payer: Self-pay

## 2023-10-19 ENCOUNTER — Other Ambulatory Visit (HOSPITAL_BASED_OUTPATIENT_CLINIC_OR_DEPARTMENT_OTHER): Payer: Self-pay

## 2023-10-19 ENCOUNTER — Other Ambulatory Visit (HOSPITAL_COMMUNITY): Payer: Self-pay

## 2023-10-21 ENCOUNTER — Encounter: Payer: Self-pay | Admitting: Adult Health

## 2023-10-21 ENCOUNTER — Other Ambulatory Visit (HOSPITAL_COMMUNITY): Payer: Self-pay

## 2023-10-21 ENCOUNTER — Other Ambulatory Visit: Payer: Self-pay

## 2023-10-21 DIAGNOSIS — G4733 Obstructive sleep apnea (adult) (pediatric): Secondary | ICD-10-CM

## 2023-10-22 ENCOUNTER — Other Ambulatory Visit (HOSPITAL_COMMUNITY): Payer: Self-pay

## 2023-10-22 ENCOUNTER — Telehealth: Payer: Self-pay | Admitting: Neurology

## 2023-10-22 NOTE — Telephone Encounter (Signed)
 HST- HTA pending

## 2023-10-24 ENCOUNTER — Telehealth: Payer: Self-pay

## 2023-10-24 NOTE — Telephone Encounter (Signed)
 Called to schedule orthotic fitting spouse (on [place]) answered// they will call back to schedule

## 2023-10-25 ENCOUNTER — Other Ambulatory Visit (HOSPITAL_COMMUNITY): Payer: Self-pay

## 2023-10-29 ENCOUNTER — Other Ambulatory Visit (HOSPITAL_COMMUNITY): Payer: Self-pay

## 2023-10-30 NOTE — Telephone Encounter (Signed)
 HST HTA shara: [id number] (exp. 10/22/23 to 01/20/24)

## 2023-11-02 ENCOUNTER — Other Ambulatory Visit (HOSPITAL_COMMUNITY): Payer: Self-pay

## 2023-11-04 NOTE — Telephone Encounter (Signed)
 Orthotics in burl bin

## 2023-11-14 ENCOUNTER — Ambulatory Visit (INDEPENDENT_AMBULATORY_CARE_PROVIDER_SITE_OTHER): Admitting: Podiatry

## 2023-11-14 DIAGNOSIS — Q667 Congenital pes cavus, unspecified foot: Secondary | ICD-10-CM

## 2023-11-14 NOTE — Progress Notes (Signed)
 Orthotics dispensed are functioning well no acute complaints.  Break-in period was discussed.  If any foot and ankle issues in future he will come back and see me

## 2023-11-15 ENCOUNTER — Other Ambulatory Visit: Payer: Self-pay | Admitting: Cardiology

## 2023-11-15 ENCOUNTER — Other Ambulatory Visit: Payer: Self-pay

## 2023-11-15 ENCOUNTER — Other Ambulatory Visit: Payer: Self-pay | Admitting: Physician Assistant

## 2023-11-15 ENCOUNTER — Telehealth: Payer: Self-pay

## 2023-11-15 ENCOUNTER — Other Ambulatory Visit (HOSPITAL_COMMUNITY): Payer: Self-pay

## 2023-11-15 DIAGNOSIS — F419 Anxiety disorder, unspecified: Secondary | ICD-10-CM

## 2023-11-15 MED ORDER — RANOLAZINE ER 1000 MG PO TB12
1000.0000 mg | ORAL_TABLET | Freq: Two times a day (BID) | ORAL | 3 refills | Status: AC
  Filled 2023-11-15: qty 180, 90d supply, fill #0
  Filled 2024-02-17: qty 180, 90d supply, fill #1

## 2023-11-15 MED ORDER — LEXAPRO 20 MG PO TABS
20.0000 mg | ORAL_TABLET | Freq: Every day | ORAL | 0 refills | Status: DC
  Filled 2023-11-15 – 2023-11-25 (×2): qty 90, 90d supply, fill #0

## 2023-11-15 MED ORDER — METOPROLOL TARTRATE 25 MG PO TABS
12.5000 mg | ORAL_TABLET | Freq: Two times a day (BID) | ORAL | 3 refills | Status: AC
  Filled 2023-11-15: qty 90, 90d supply, fill #0
  Filled 2024-02-17: qty 90, 90d supply, fill #1

## 2023-11-15 NOTE — Telephone Encounter (Signed)
 Copied from CRM #[id number]. Topic: Clinical - Medication Question >> Nov 15, 2023  1:16 PM DeAngela L wrote: Reason for CRM: patient is going to need a 90 day supply for the medication  LEXAPRO  20 MG tablet brand name only please Patient will be leaving the country for vacation and he needs more than a 30 day supply and calling in advance cause his pharmacy is sending over a refill request and the patient get 30 days but needs 60 or 90 they are trying to take extra medication with them on vacay cause never know the circumstances, the patient is fine with all other meds just need update on LEXAPRO  20 MG tablet   Pt num [phone] (H)

## 2023-11-18 ENCOUNTER — Other Ambulatory Visit: Payer: Self-pay

## 2023-11-18 ENCOUNTER — Other Ambulatory Visit: Payer: Self-pay | Admitting: Physician Assistant

## 2023-11-18 DIAGNOSIS — F419 Anxiety disorder, unspecified: Secondary | ICD-10-CM

## 2023-11-19 ENCOUNTER — Other Ambulatory Visit: Payer: Self-pay

## 2023-11-20 ENCOUNTER — Other Ambulatory Visit: Payer: Self-pay

## 2023-11-20 MED FILL — Alprazolam Tab 0.5 MG: ORAL | 30 days supply | Qty: 30 | Fill #0 | Status: AC

## 2023-11-21 ENCOUNTER — Other Ambulatory Visit: Payer: Self-pay

## 2023-11-25 ENCOUNTER — Other Ambulatory Visit (HOSPITAL_COMMUNITY): Payer: Self-pay

## 2023-11-25 ENCOUNTER — Other Ambulatory Visit: Payer: Self-pay

## 2023-11-26 ENCOUNTER — Other Ambulatory Visit (HOSPITAL_COMMUNITY): Payer: Self-pay

## 2023-11-26 ENCOUNTER — Encounter (HOSPITAL_COMMUNITY): Payer: Self-pay

## 2023-11-26 ENCOUNTER — Other Ambulatory Visit: Payer: Self-pay

## 2023-11-26 ENCOUNTER — Ambulatory Visit: Admitting: Neurology

## 2023-11-26 DIAGNOSIS — I2510 Atherosclerotic heart disease of native coronary artery without angina pectoris: Secondary | ICD-10-CM

## 2023-11-26 DIAGNOSIS — I63413 Cerebral infarction due to embolism of bilateral middle cerebral arteries: Secondary | ICD-10-CM

## 2023-11-26 DIAGNOSIS — I2089 Other forms of angina pectoris: Secondary | ICD-10-CM

## 2023-11-26 DIAGNOSIS — G4733 Obstructive sleep apnea (adult) (pediatric): Secondary | ICD-10-CM | POA: Diagnosis not present

## 2023-11-26 DIAGNOSIS — I480 Paroxysmal atrial fibrillation: Secondary | ICD-10-CM

## 2023-11-27 NOTE — Progress Notes (Signed)
 [facility]  Kevin [name] 68 year old male [birth year]   HOME SLEEP TEST REPORT ( by Watch PAT)   STUDY DATE:  11-26-2023   ORDERING CLINICIAN:  Dedra Gores, MD  REFERRING CLINICIAN: MM, NP    CLINICAL INFORMATION/HISTORY:   CPAP dating from 2019 finally needs replaced. HST ordered by Duwaine Russell, NP . 68 y.o. 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 here with:  03-12-2023: MM video visit : Kevin [name] is a 68 y.o. male with a history of OSA on CPAP. Returns today for follow-up. He reports that the CPAP is working well for him.  We have not been able to obtain a download.  Was unable to get a modem.  We are reaching out to the DME company.  He states that he approximately uses the CPAP 9 hours each night.  Denies any new issues.   06-07-2020- ESS 15/ 24. Based on his home sleep test in September 2019 he had a mild apnea at 14.9/h AHI but loud snoring , no associated hypoxemia and normal heart rate variation. He received a [employer] that is probably part of recall.  Mr [name] returns for regular memory testing today.   6//25/15: [name] Kevin is a 68 y.o. male stroke patient of Dr Wellington, here in new visit, after last year's referral for transition of care from Dr. Maurice. HST in 2013 negative for OSA, Epworth score is 18, FSS 43 points, hypersomnia persistent. 2007 two strokes , in March affecting the left and in May 2007 affecting  right hemisphere. He has typical right brain deficits based on his neuropsychological testing. He had been a patient of Dr Rosemarie after the strokes. Was non complaint with CPAP in the past before he tested negative for sleep apnea.03-21-2012.       Epworth sleepiness score: 12/24.   BMI: 32.3 kg/m   Neck Circumference: 16     Sleep Summary:    Total Recording Time (hours, min):   8 hours 55 minutes  Total Sleep Time (hours, min):    7 hours and  14 minutes             Percent REM (%): 18%     Sleep latency was 19 minutes and REM sleep latency 127 minutes with 37 minutes of wakefulness after sleep onset.                                    Respiratory Indices:   Calculated pAHI (per CMS guideline): 2.8/h.  Following AASM criteria this patient would have had mild obstructive sleep apnea with an AHI of 12/h.  No central events were recorded.                       REM pAHI:    5/h  NREM pAHI: 2.2/h                            Positional AHI: The patient stayed the majority of time on his right side, 268 minutes with an AHI of 0.3 per CMS criteria followed by 170 minutes on his left side with an AHI of 5.1/h and supine sleep for 94 minutes associated with an AHI of 5.8/h.  Snoring level reached a mean volume of 41 dB and snoring was present for 36% of the recording time. Snoring:                                                Oxygen Saturation Statistics:   Oxygen Saturation (%) Mean: 94% with a nadir at 89% and the maximum saturation of 98% and 0 minutes of hypoxia, defined as O2 Saturation (minutes) <89%:           Pulse Rate Statistics:   Pulse Mean (bpm): Mean pulse rate was 62 bpm with a minimum heart rate of 45 and a maximum of 87 bpm.                     IMPRESSION:  This HST was scored by CMS Medicare criteria and does no longer fulfill the diagnosis of obstructive sleep apnea .  RECOMMENDATION: A significant degree of  apnea was not seen except during REM sleep in supine position I would therefore asked the patient to avoid sleeping on his back this will also help to reduce snoring.  I regret to inform Mr. [name] that by Medicare criteria he no longer qualifies for CPAP.   Any patient should be cautioned not to drive, work at heights, or operate dangerous or heavy equipment when tired or sleepy.   Review of good sleep hygiene measures is accessible to any sleep clinic patient and can be reiterated through online material- I we recommend the Guide to better Sleep   by the NIH.   Weight loss and Core Strength improvement is highly recommended for individuals with low muscle tone and/ or a BMI over 30.  Any CPAP patient should be reminded to be fully compliant with PAP therapy , (defined as using PAP therapy for more than 4 hours each night ) with the goal to improve sleep related symptoms and decrease long term cardiovascular risks. Any PAP therapy patient should be reminded, that it may take up to 3 months to get fully used to using PAP and it may take 1-2 weeks for an established CPAP user to acclimatize to changes in pressure or mask. The earlier full compliance is achieved, the better long term compliance tends to be.   Please note that untreated obstructive sleep apnea may carry additional perioperative morbidity. Patients with significant obstructive sleep apnea should receive perioperative PAP therapy and the surgical team should be informed of the diagnosis and degree of sleep disordered breathing.  Sleep fragmentation in the presence of normal proportional sleep stages is a nonspecific findings and per se does not signify an intrinsic sleep disorder or a cause for the patient's sleep-related symptoms.  Causes include (but are not limited to) the unfamiliarity of sleeping while recorded by HST device or sleeping in a sleep lab for a full Polysomnography sleep study, but also circadian rhythm disturbances,  medication side  effects or an underlying mood disorder or medical problem.   The referring physician will be notified of the test results.       INTERPRETING PHYSICIAN:   Dedra Gores, MD  [facility] Sleep Board certified by The [employer] of Sleep Medicine and Diplomate of the [employer] of Sleep Medicine. Board certified In Neurology through the ABPN, Fellow of the [employer] of Neu[employer].

## 2023-12-06 ENCOUNTER — Ambulatory Visit: Payer: Self-pay | Admitting: Neurology

## 2023-12-06 DIAGNOSIS — I63413 Cerebral infarction due to embolism of bilateral middle cerebral arteries: Secondary | ICD-10-CM | POA: Insufficient documentation

## 2023-12-06 NOTE — Procedures (Signed)
 [facility]  Saahil Herbster 68 year old male [birth year]   HOME SLEEP TEST REPORT ( by Watch PAT)   STUDY DATE:  11-26-2023   ORDERING CLINICIAN:  Dedra Gores, MD  REFERRING CLINICIAN: MM, NP    CLINICAL INFORMATION/HISTORY:   CPAP dating from 2019 finally needs replaced. HST ordered by Duwaine Russell, NP . 68 y.o. 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 here with:  03-12-2023: MM video visit : [name] is a 68 y.o. male with a history of OSA on CPAP. Returns today for follow-up. He reports that the CPAP is working well for him.  We have not been able to obtain a download.  Was unable to get a modem.  We are reaching out to the DME company.  He states that he approximately uses the CPAP 9 hours each night.  Denies any new issues.   06-07-2020- ESS 15/ 24. Based on his home sleep test in September 2019 he had a mild apnea at 14.9/h AHI but loud snoring , no associated hypoxemia and normal heart rate variation. He received a [employer] that is probably part of recall.  Mr [name] returns for regular memory testing today.   6//25/15: BOWIE DELIA is a 68 y.o. male stroke patient of Dr Wellington, here in new visit, after last year's referral for transition of care from Dr. Maurice. HST in 2013 negative for OSA, Epworth score is 18, FSS 43 points, hypersomnia persistent. 2007 two strokes , in March affecting the left and in May 2007 affecting  right hemisphere. He has typical right brain deficits based on his neuropsychological testing. He had been a patient of Dr Rosemarie after the strokes. Was non complaint with CPAP in the past before he tested negative for sleep apnea.03-21-2012.       Epworth sleepiness score: 12/24.   BMI: 32.3 kg/m   Neck Circumference: 16     Sleep Summary:    Total Recording Time (hours, min):   8 hours 55 minutes  Total Sleep Time (hours, min):    7 hours and  14 minutes             Percent REM (%): 18%     Sleep latency was 19 minutes and REM sleep latency 127 minutes with 37 minutes of wakefulness after sleep onset.                                    Respiratory Indices:   Calculated pAHI (per CMS guideline): 2.8/h.  Following AASM criteria this patient would have had mild obstructive sleep apnea with an AHI of 12/h.  No central events were recorded.                       REM pAHI:    5/h  NREM pAHI: 2.2/h                            Positional AHI: The patient stayed the majority of time on his right side, 268 minutes with an AHI of 0.3 per CMS criteria followed by 170 minutes on his left side with an AHI of 5.1/h and supine sleep for 94 minutes associated with an AHI of 5.8/h.  Snoring level reached a mean volume of 41 dB and snoring was present for 36% of the recording time. Snoring:                                                Oxygen Saturation Statistics:   Oxygen Saturation (%) Mean: 94% with a nadir at 89% and the maximum saturation of 98% and 0 minutes of hypoxia, defined as O2 Saturation (minutes) <89%:           Pulse Rate Statistics:   Pulse Mean (bpm): Mean pulse rate was 62 bpm with a minimum heart rate of 45 and a maximum of 87 bpm.                     IMPRESSION:  This HST was scored by CMS Medicare criteria and does no longer fulfill the diagnosis of obstructive sleep apnea .  RECOMMENDATION: A significant degree of apnea was  not seen except during REM sleep in supine position I would therefore asked the patient to avoid sleeping on his back this will also help to reduce snoring.  I regret to inform Mr. Salts that by Medicare criteria he no longer qualifies for CPAP.   Any patient should be cautioned not to drive, work at heights, or operate dangerous or heavy equipment when tired or sleepy.   Review of good sleep hygiene measures is accessible to any sleep clinic patient and can be reiterated through online material- I we recommend the Guide to better Sleep   by the NIH.   Weight loss and Core Strength improvement is highly recommended for individuals with low muscle tone and/ or a BMI over 30.  Any CPAP patient should be reminded to be fully compliant with PAP therapy , (defined as using PAP therapy for more than 4 hours each night ) with the goal to improve sleep related symptoms and decrease long term cardiovascular risks. Any PAP therapy patient should be reminded, that it may take up to 3 months to get fully used to using PAP and it may take 1-2 weeks for an established CPAP user to acclimatize to changes in pressure or mask. The earlier full compliance is achieved, the better long term compliance tends to be.   Please note that untreated obstructive sleep apnea may carry additional perioperative morbidity. Patients with significant obstructive sleep apnea should receive perioperative PAP therapy and the surgical team should be informed of the diagnosis and degree of sleep disordered breathing.  Sleep fragmentation in the presence of normal proportional sleep stages is a nonspecific findings and per se does not signify an intrinsic sleep disorder or a cause for the patient's sleep-related symptoms.  Causes include (but are not limited to) the unfamiliarity of sleeping while recorded by HST device or sleeping in a sleep lab for a full Polysomnography sleep study, but also circadian rhythm disturbances, medication side  effects or an underlying mood disorder or medical problem.   The referring physician will be notified of the test results.       INTERPRETING PHYSICIAN:   Dedra Gores, MD  [facility] Sleep Board certified by The [employer] of Sleep Medicine and Diplomate of the [employer] of Sleep Medicine. Board certified In Neurology through the ABPN, Fellow of the [employer] of Neu[employer].

## 2024-01-13 ENCOUNTER — Encounter: Payer: Self-pay | Admitting: Neurology

## 2024-01-13 DIAGNOSIS — G4730 Sleep apnea, unspecified: Secondary | ICD-10-CM

## 2024-01-13 DIAGNOSIS — I10 Essential (primary) hypertension: Secondary | ICD-10-CM

## 2024-01-13 DIAGNOSIS — G3184 Mild cognitive impairment, so stated: Secondary | ICD-10-CM

## 2024-01-13 DIAGNOSIS — I480 Paroxysmal atrial fibrillation: Secondary | ICD-10-CM

## 2024-01-13 DIAGNOSIS — G4710 Hypersomnia, unspecified: Secondary | ICD-10-CM

## 2024-01-13 NOTE — Telephone Encounter (Signed)
 Patient thad only mild sleep apnea on HST but reports severe sleepiness , fatigue and fragmented sleep when being without CPAP. He would like to confirm that there is truly no apnea left by PSG , he feels he needs a new CPAP.   I have ordered the in lab sleep study. Dedra Gores, MD

## 2024-01-16 ENCOUNTER — Encounter: Payer: Self-pay | Admitting: Ophthalmology

## 2024-01-16 ENCOUNTER — Telehealth: Payer: Self-pay | Admitting: Neurology

## 2024-01-16 NOTE — Telephone Encounter (Signed)
 [facility] HTA pending

## 2024-01-16 NOTE — Anesthesia Preprocedure Evaluation (Addendum)
 Anesthesia Evaluation  Patient identified by MRN, date of birth, ID band Patient awake    Reviewed: Allergy & Precautions, H&P , NPO status , Patient's Chart, lab work & pertinent test results  Airway Mallampati: IV  TM Distance: <3 FB Neck ROM: Full   Comment: Short, thick neck, very short thyromental distance, OSA on CPAP Dental no notable dental hx.    Pulmonary neg pulmonary ROS, asthma , sleep apnea    Pulmonary exam normal breath sounds clear to auscultation       Cardiovascular hypertension, + angina  + CAD and + DOE  negative cardio ROS Normal cardiovascular exam Rhythm:Regular Rate:Normal  Wife is cardiac stepdown nurse  Patient is male with a history of CAD status post four-vessel bypass and subsequent LAD stenting, paroxysmal atrial fibrillation status post Maze procedure, PFO s/p closure, stroke with resultant memory deficits, hypertension, hyperlipidemia, CKD 3, and obstructive sleep apnea.  Cardiac history dates back to February 2010, when he underwent CABG x4. At the time, he also underwent maze and PFO closure. Due to recurrent angina, he required diagnostic catheterization in August 2011 revealing an atretic LIMA to the LAD and an occlusion of the sequential graft to the OM1 and OM 2. At that time, he underwent PCI and drug-eluting stent placement to the native LAD. He has had intermittent chest pain over the years and has been maintained on long-acting nitrate and Ranexa  therapy. He underwent diagnostic catheterization in August 2018 due to recurrent rest and exertional chest discomfort. This showed stable anatomy with patent LAD stents as well as patency of the vein graft to the diagonal and native RCA.    In the setting of recurrent chest pain, he underwent stress testing in September 2019 and again in July 2023, both times showing normal LV function without ischemia.   Kevin [name] was last seen in cardiology clinic in May  2024, at which time he was doing well.  Over the past year, he has cont to do well.  He cont to work and is very active at his job w/o symptoms or limitations.  He has a long h/o intermittent chest tightness that occurs most evenings, and is overall unchanged.  He also has chronic dyspnea on exertion at higher levels of activity such as when he drags his to garbage cans up his driveway, though both sets of symptoms resolved in short order.  His heart rate is 49 today.  He denies any presyncope/syncope, palpitations, PND, orthopnea, edema, or early satiety  ECG personally reviewed by me today - EKG Interpretation Date/Time:              Wednesday October 09 2023 09:04:51 EDT Ventricular Rate:    49 PR Interval:             210 QRS Duration:                  102 QT Interval:             474 QTC Calculation:428 R Axis:                               206   Text Interpretation:Sinus bradycardia First degree A-V block Right superior axis deviation When compared with ECG of 24-Mar-2017 17:13, Junctional rhythm has replaced Sinus rhythm QRS axis Shifted left T wave inversion no longer evident in Anterior leads Reconfirmed by Kevin [name]  1.  CAD status post CABG  x 4 in 2010 with subsequent drug-eluting stent placement to the LAD in August 2011 (LIMA to the LAD was atretic at that time).  He has somewhat chronic intermittent chest discomfort occurring most evenings, typically with rest, which is unchanged dating back to stress testing in July 2023.  He is very active throughout his day without symptoms or limitations and overall, has been doing well.  He remains on aspirin , Plavix , nitrate, Zetia , Ranexa , and statin therapy.  In the setting of sinus bradycardia with a first-degree AV block and heart rate of 49, I am reducing his metoprolol  to 12.5 mg twice daily.  11-29-16 cath   The left ventricular systolic function is normal.  LV end diastolic pressure is mildly elevated.  The left ventricular  ejection fraction is 55-65% by visual estimate.  Mid LAD lesion, 15 %stenosed.  Prox LAD lesion, 20 %stenosed.  Ost 2nd Diag to 2nd Diag lesion, 80 %stenosed.  SVG and is normal in caliber.  The graft exhibits minimal luminal irregularities.  SVG.  Origin lesion, 100 %stenosed.   1. Significant underlying one-vessel coronary artery disease with patent SVG to second diagonal, patent LAD stent with mild in-stent restenosis and known occluded SVG to OM. Native RCA and left circumflex don't have obstructive disease.   2. Normal LV systolic function and mildly elevated left ventricular end-diastolic pressure.       Neuro/Psych  PSYCHIATRIC DISORDERS Anxiety Depression    2007 two strokes , in March affecting the left and in May 2007 affecting right hemisphere. He has typical right brain deficits based on his neuropsychological testing. He had been a patient of Dr Rosemarie after the strokes  Neuromuscular disease CVA negative neurological ROS  negative psych ROS   GI/Hepatic negative GI ROS, Neg liver ROS,GERD  ,,  Endo/Other  negative endocrine ROS    Renal/GU Renal diseasenegative Renal ROS  negative genitourinary   Musculoskeletal negative musculoskeletal ROS (+) Arthritis ,    Abdominal   Peds negative pediatric ROS (+)  Hematology negative hematology ROS (+)   Anesthesia Other Findings Hyperlipidemia  Sleep apnea Thrombocytopenia  Overweight(278.02) Stroke (HCC) Hypertension Coronary artery disease H/O maze procedure  Brachial plexopathy Psoriatic arthritis (HCC) Hypersomnia, organic PAF (paroxysmal atrial fibrillation) (HCC)  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), stage II - III  Skin cancer Allergy Depression Asthma  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  OSA on CPAP Stable angina Atherosclerosis of native coronary artery of native heart, unspecified whether angina  present  Psoriasis 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[place])  Mild cognitive impairment Anxiety  Obesity Mesenteric ischemia  History of maze procedure Brachial plexopathy  History of surgical closure of patent foramen ovale (PFO) during CABG S/P CABG x 4 History of heart artery stent DOE (dyspnea on exertion)     Reproductive/Obstetrics negative OB ROS                              Anesthesia Physical Anesthesia Plan  ASA: 3  Anesthesia Plan: MAC   Post-op Pain Management:    Induction: Intravenous  PONV Risk Score and Plan:   Airway Management Planned: Natural Airway and Nasal Cannula  Additional Equipment:   Intra-op Plan:   Post-operative Plan:   Informed Consent: I have reviewed the patients History and Physical, chart, labs and discussed the procedure including the risks, benefits and alternatives for the proposed anesthesia with the patient or authorized  representative who has indicated his/her understanding and acceptance.     Dental Advisory Given  Plan Discussed with: Anesthesiologist, CRNA and Surgeon  Anesthesia Plan Comments: (Patient consented for risks of anesthesia including but not limited to:  - adverse reactions to medications - damage to eyes, teeth, lips or other oral mucosa - nerve damage due to positioning  - sore throat or hoarseness - Damage to heart, brain, nerves, lungs, other parts of body or loss of life  Patient voiced understanding and assent.)         Anesthesia Quick Evaluation

## 2024-01-19 ENCOUNTER — Other Ambulatory Visit: Payer: Self-pay | Admitting: Physician Assistant

## 2024-01-19 DIAGNOSIS — F419 Anxiety disorder, unspecified: Secondary | ICD-10-CM

## 2024-01-20 ENCOUNTER — Other Ambulatory Visit: Payer: Self-pay | Admitting: Physician Assistant

## 2024-01-20 ENCOUNTER — Other Ambulatory Visit (HOSPITAL_COMMUNITY): Payer: Self-pay

## 2024-01-20 ENCOUNTER — Other Ambulatory Visit: Payer: Self-pay

## 2024-01-20 DIAGNOSIS — F419 Anxiety disorder, unspecified: Secondary | ICD-10-CM

## 2024-01-20 NOTE — Discharge Instructions (Signed)

## 2024-01-21 ENCOUNTER — Other Ambulatory Visit: Payer: Self-pay

## 2024-01-22 ENCOUNTER — Ambulatory Visit: Payer: Self-pay | Admitting: Anesthesiology

## 2024-01-22 ENCOUNTER — Ambulatory Visit
Admission: RE | Admit: 2024-01-22 | Discharge: 2024-01-22 | Disposition: A | Discharge status: Home / Self Care | Attending: Ophthalmology | Admitting: Ophthalmology

## 2024-01-22 ENCOUNTER — Other Ambulatory Visit: Payer: Self-pay

## 2024-01-22 ENCOUNTER — Encounter
Admission: RE | Disposition: A | Discharge status: Home / Self Care | Payer: Self-pay | Source: Home / Self Care | Attending: Ophthalmology

## 2024-01-22 ENCOUNTER — Encounter: Payer: Self-pay | Admitting: Ophthalmology

## 2024-01-22 DIAGNOSIS — I25119 Atherosclerotic heart disease of native coronary artery with unspecified angina pectoris: Secondary | ICD-10-CM | POA: Insufficient documentation

## 2024-01-22 DIAGNOSIS — G4733 Obstructive sleep apnea (adult) (pediatric): Secondary | ICD-10-CM | POA: Insufficient documentation

## 2024-01-22 DIAGNOSIS — I129 Hypertensive chronic kidney disease with stage 1 through stage 4 chronic kidney disease, or unspecified chronic kidney disease: Secondary | ICD-10-CM | POA: Insufficient documentation

## 2024-01-22 DIAGNOSIS — M1990 Unspecified osteoarthritis, unspecified site: Secondary | ICD-10-CM | POA: Diagnosis not present

## 2024-01-22 DIAGNOSIS — J45909 Unspecified asthma, uncomplicated: Secondary | ICD-10-CM | POA: Insufficient documentation

## 2024-01-22 DIAGNOSIS — R0609 Other forms of dyspnea: Secondary | ICD-10-CM | POA: Insufficient documentation

## 2024-01-22 DIAGNOSIS — N183 Chronic kidney disease, stage 3 unspecified: Secondary | ICD-10-CM | POA: Diagnosis not present

## 2024-01-22 DIAGNOSIS — Z955 Presence of coronary angioplasty implant and graft: Secondary | ICD-10-CM | POA: Diagnosis not present

## 2024-01-22 DIAGNOSIS — H2511 Age-related nuclear cataract, right eye: Secondary | ICD-10-CM | POA: Diagnosis present

## 2024-01-22 DIAGNOSIS — F419 Anxiety disorder, unspecified: Secondary | ICD-10-CM | POA: Diagnosis not present

## 2024-01-22 DIAGNOSIS — Z951 Presence of aortocoronary bypass graft: Secondary | ICD-10-CM | POA: Diagnosis not present

## 2024-01-22 DIAGNOSIS — K219 Gastro-esophageal reflux disease without esophagitis: Secondary | ICD-10-CM | POA: Insufficient documentation

## 2024-01-22 DIAGNOSIS — Z7951 Long term (current) use of inhaled steroids: Secondary | ICD-10-CM | POA: Insufficient documentation

## 2024-01-22 DIAGNOSIS — F32A Depression, unspecified: Secondary | ICD-10-CM | POA: Insufficient documentation

## 2024-01-22 DIAGNOSIS — Z7982 Long term (current) use of aspirin: Secondary | ICD-10-CM | POA: Diagnosis not present

## 2024-01-22 DIAGNOSIS — Z79899 Other long term (current) drug therapy: Secondary | ICD-10-CM | POA: Diagnosis not present

## 2024-01-22 HISTORY — DX: Psoriasis, unspecified: L40.9

## 2024-01-22 HISTORY — DX: Unspecified osteoarthritis, unspecified site: M19.90

## 2024-01-22 HISTORY — DX: Cerebral infarction due to embolism of bilateral middle cerebral arteries: I63.413

## 2024-01-22 HISTORY — DX: Gastro-esophageal reflux disease without esophagitis: K21.9

## 2024-01-22 HISTORY — DX: Personal history of (corrected) congenital malformations of heart and circulatory system: Z87.74

## 2024-01-22 HISTORY — DX: Mild cognitive impairment of uncertain or unknown etiology: G31.84

## 2024-01-22 HISTORY — DX: Esophageal obstruction: K22.2

## 2024-01-22 HISTORY — PX: CATARACT EXTRACTION W/PHACO: SHX586

## 2024-01-22 HISTORY — DX: Other forms of angina pectoris: I20.89

## 2024-01-22 HISTORY — DX: Obstructive sleep apnea (adult) (pediatric): G47.33

## 2024-01-22 HISTORY — DX: Other specified postprocedural states: Z98.890

## 2024-01-22 HISTORY — DX: Obesity, unspecified: E66.9

## 2024-01-22 HISTORY — DX: Vascular disorder of intestine, unspecified: K55.9

## 2024-01-22 HISTORY — DX: Atherosclerotic heart disease of native coronary artery without angina pectoris: I25.10

## 2024-01-22 HISTORY — DX: Other forms of dyspnea: R06.09

## 2024-01-22 HISTORY — DX: Anxiety disorder, unspecified: F41.9

## 2024-01-22 SURGERY — PHACOEMULSIFICATION, CATARACT, WITH IOL INSERTION
Anesthesia: Monitor Anesthesia Care | Site: Eye | Laterality: Right

## 2024-01-22 MED ORDER — TETRACAINE HCL 0.5 % OP SOLN
1.0000 [drp] | OPHTHALMIC | Status: DC | PRN
  Administered 2024-01-22 (×3): 1 [drp] via OPHTHALMIC

## 2024-01-22 MED ORDER — SIGHTPATH DOSE#1 NA HYALUR & NA CHOND-NA HYALUR IO KIT
PACK | INTRAOCULAR | Status: DC | PRN
  Administered 2024-01-22: 1 via OPHTHALMIC

## 2024-01-22 MED ORDER — FENTANYL CITRATE (PF) 100 MCG/2ML IJ SOLN
INTRAMUSCULAR | Status: AC
  Filled 2024-01-22: qty 2

## 2024-01-22 MED ORDER — SIGHTPATH DOSE#1 BSS IO SOLN
INTRAOCULAR | Status: DC | PRN
  Administered 2024-01-22: 15 mL via INTRAOCULAR

## 2024-01-22 MED ORDER — BRIMONIDINE TARTRATE-TIMOLOL 0.2-0.5 % OP SOLN
OPHTHALMIC | Status: DC | PRN
  Administered 2024-01-22: 1 [drp] via OPHTHALMIC

## 2024-01-22 MED ORDER — LACTATED RINGERS IV SOLN: INTRAVENOUS | Status: DC

## 2024-01-22 MED ORDER — MIDAZOLAM HCL (PF) 2 MG/2ML IJ SOLN
INTRAMUSCULAR | Status: DC | PRN
  Administered 2024-01-22 (×2): 1 mg via INTRAVENOUS

## 2024-01-22 MED ORDER — MIDAZOLAM HCL 2 MG/2ML IJ SOLN
INTRAMUSCULAR | Status: AC
  Filled 2024-01-22: qty 2

## 2024-01-22 MED ORDER — TETRACAINE HCL 0.5 % OP SOLN
OPHTHALMIC | Status: AC
  Filled 2024-01-22: qty 4

## 2024-01-22 MED ORDER — LIDOCAINE HCL (PF) 2 % IJ SOLN
INTRAOCULAR | Status: DC | PRN
  Administered 2024-01-22: 2 mL

## 2024-01-22 MED ORDER — ARMC OPHTHALMIC DILATING DROPS
OPHTHALMIC | Status: AC
  Filled 2024-01-22: qty 0.5

## 2024-01-22 MED ORDER — CEFUROXIME OPHTHALMIC INJECTION 1 MG/0.1 ML
INJECTION | OPHTHALMIC | Status: DC | PRN
  Administered 2024-01-22: .1 mL via INTRACAMERAL

## 2024-01-22 MED ORDER — SIGHTPATH DOSE#1 BSS IO SOLN
INTRAOCULAR | Status: DC | PRN
  Administered 2024-01-22: 74 mL via OPHTHALMIC

## 2024-01-22 MED ORDER — ARMC OPHTHALMIC DILATING DROPS
1.0000 | OPHTHALMIC | Status: DC | PRN
Start: 2024-01-22 — End: 2024-01-22
  Administered 2024-01-22 (×3): 1 via OPHTHALMIC

## 2024-01-22 MED ORDER — FENTANYL CITRATE (PF) 100 MCG/2ML IJ SOLN
INTRAMUSCULAR | Status: DC | PRN
  Administered 2024-01-22 (×2): 25 ug via INTRAVENOUS

## 2024-01-22 SURGICAL SUPPLY — 8 items
FEE CATARACT SUITE SIGHTPATH (MISCELLANEOUS) ×2 IMPLANT
GLOVE BIOGEL PI IND STRL 8 (GLOVE) ×2 IMPLANT
GLOVE SURG LX STRL 7.5 STRW (GLOVE) ×2 IMPLANT
GLOVE SURG SYN 6.5 PF PI BL (GLOVE) ×2 IMPLANT
LENS IOL PANO PRO 11.0 IMPLANT
NDL FILTER BLUNT 18X1 1/2 (NEEDLE) ×2 IMPLANT
NEEDLE FILTER BLUNT 18X1 1/2 (NEEDLE) ×1 IMPLANT
SYR 3ML LL SCALE MARK (SYRINGE) ×2 IMPLANT

## 2024-01-22 NOTE — Op Note (Signed)
 LOCATION:  [facility]   PREOPERATIVE DIAGNOSIS:    Nuclear sclerotic cataract right eye. H25.11   POSTOPERATIVE DIAGNOSIS:  Nuclear sclerotic cataract right eye.     PROCEDURE:  Phacoemusification with posterior chamber intraocular lens placement of the right eye   ULTRASOUND TIME: Procedure(s): PHACOEMULSIFICATION, CATARACT, WITH IOL INSERTION 6.90 00:49.3 (Right)  LENS:   Implant Name Type Inv. Item Serial No. Manufacturer Lot No. LRB No. Used Action  LENS IOL PANO PRO 11.0 - [id number]  LENS IOL PANO PRO 11.0 [id number] [employer]  Right 1 Implanted         SURGEON:  Dene FABIENE Etienne, MD   ANESTHESIA:  Topical with tetracaine drops and 2% Xylocaine  jelly, augmented with 1% preservative-free intracameral lidocaine .    COMPLICATIONS:  None.   DESCRIPTION OF PROCEDURE:  The patient was identified in the holding room and transported to the operating room and placed in the supine position under the operating microscope.  The right eye was identified as the operative eye and it was prepped and draped in the usual sterile ophthalmic fashion.   A 1 millimeter clear-corneal paracentesis was made at the 12:00 position.  0.5 ml of preservative-free 1% lidocaine  was injected into the anterior chamber. The anterior chamber was filled with Viscoat viscoelastic.  A 2.4 millimeter keratome was used to make a near-clear corneal incision at the 9:00 position.  A curvilinear capsulorrhexis was made with a cystotome and capsulorrhexis forceps.  Balanced salt solution was used to hydrodissect and hydrodelineate the nucleus.   Phacoemulsification was then used in stop and chop fashion to remove the lens nucleus and epinucleus.  The remaining cortex was then removed using the irrigation and aspiration handpiece. Provisc was then placed into the capsular bag to distend it for lens placement.  A lens was then injected into the capsular bag.  The remaining viscoelastic was aspirated.    Wounds were hydrated with balanced salt solution.  The anterior chamber was inflated to a physiologic pressure with balanced salt solution.  No wound leaks were noted. Cefuroxime 0.1 ml of a 10mg /ml solution was injected into the anterior chamber for a dose of 1 mg of intracameral antibiotic at the completion of the case.   Timolol and Brimonidine drops were applied to the eye.  The patient was taken to the recovery room in stable condition without complications of anesthesia or surgery.   Wheeler [employer]orvaia 01/22/2024, 10:07 AM

## 2024-01-22 NOTE — H&P (Signed)
 [facility]   Primary Care Physician:  Ostwalt, Janna, PA-C Ophthalmologist: Dr. Dene Etienne  Pre-Procedure History & Physical: HPI:  Kevin [name] is a 68 y.o. male here for ophthalmic surgery.   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 05/2008 CABGx4: LIMA-LAD, SVG-Diag, sequential SVG-OM1/OM2; b. 10/2009 s/p PCI/DES x 2 to LAD; c. 03/2013 Cath: patent LAD stents; d. 10/2016 Cath: LM min irregs, LAD 20p, 86m ISR, D2 80ost, LCX min irregs, OM1/2/3 min irregs, RCA/RPDA/RPAV min irregs, RPL1/2 nl RPL3 min irregs, VG->D2 nl, VG->OM1->OM2 100, EF 55-65%; e. 12/2017 MV: distal inflat/apical scar, no isch; f. 09/2021 MV: No isch. EF 63%.   Depression 1988   DOE (dyspnea on exertion)    GERD (gastroesophageal reflux disease)    H/O maze procedure 05/17/2008   a. 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    Hypertension    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Schatzki)    Memory deficit after cerebral infarction    Mesenteric ischemia    Mild cognitive impairment    Obesity    OSA on CPAP    Overweight(278.02)    PAF (paroxysmal atrial fibrillation) (HCC)    a. s/p Maze 05/2008, previously on Eliquis->discontinued 05/2016; c. CHADS2VASc => 4 (HTN, stroke x 2, vascular disease)   Patent foramen ovale    a. 05/2008 s/p closure @ time of CABG.   Psoriasis    S/P CABG x 4 2010   Skin cancer    Sleep apnea     Stable angina    Stroke [facility]) 2007   a. 07/2005 right brain; b. 05/2005 left brain; c. Resultant memory deficits.2007 two strokes , in March affecting the left and in May 2007 affecting right hemisphere   Thrombocytopenia     Past Surgical History:  Procedure Laterality Date   ACUTE PANCREATITIS  5/11   APPENDECTOMY  1984   CARDIAC CATHETERIZATION     CHOLECYSTECTOMY  08/19/2009   COLONOSCOPY WITH PROPOFOL  N/A 06/07/2017   Procedure: COLONOSCOPY WITH PROPOFOL ;  Surgeon: Janalyn Keene NOVAK, MD;  Location: [facility] ENDOSCOPY;  Service: Endoscopy;  Laterality: N/A;   CORONARY ARTERY BYPASS GRAFT  05/12/2008   x4 by PeterVan Trigt,MD   ESOPHAGOGASTRODUODENOSCOPY N/A 03/03/2020   Procedure: ESOPHAGOGASTRODUODENOSCOPY (EGD);  Surgeon: Janalyn Keene NOVAK, MD;  Location: [facility] ENDOSCOPY;  Service: Endoscopy;  Laterality: N/A;   LEFT HEART CATH AND CORONARY ANGIOGRAPHY N/A 11/29/2016   Procedure: LEFT HEART CATH AND CORONARY ANGIOGRAPHY;  Surgeon: Darron Deatrice LABOR, MD;  Location: [facility] INVASIVE CV LAB;  Service: Cardiovascular;  Laterality: N/A;   PATENT FORAMEN OVALE CLOSURE     TONSILLECTOMY     AS A CHILD    Prior to Admission medications   Medication Sig Start Date End Date Taking? Authorizing Provider  albuterol  (VENTOLIN  HFA) 108 (90 Base) MCG/ACT inhaler Inhale 2 puffs into the lungs every 6 (six) hours as needed for wheezing or shortness of breath. 10/01/20  Yes Kennyth Domino, FNP  ALPRAZolam  (XANAX ) 0.5 MG tablet Take 1 tablet (0.5 mg total) by mouth at bedtime. 11/20/23  Yes Ostwalt, Janna, PA-C  ARTHRITIS PAIN RELIEF 650 MG CR tablet Take 650 mg by mouth every 8 (eight) hours as needed. 08/05/19  Yes [provider]  aspirin  EC 81 MG tablet Take 81 mg by mouth daily.   Yes [provider]  cetirizine (ZYRTEC) 10 MG tablet Take 10 mg by mouth at bedtime.   Yes [provider]  clopidogrel  (PLAVIX ) 75 MG tablet Take 1 tablet (75 mg total) by mouth daily. 07/08/23  Yes  Hammock, Tylene, NP  ezetimibe  (ZETIA ) 10 MG tablet Take 1 tablet (10 mg total) by mouth daily. 10/18/23  Yes Hammock, Tylene, NP  isosorbide  mononitrate (IMDUR ) 30 MG 24 hr tablet Take 1 tablet (30 mg total) by mouth daily. 10/18/23  Yes Hammock, Sheri, NP  LEXAPRO  20 MG tablet Take 1 tablet (20 mg total) by mouth daily. Must make appointment for refills 11/15/23  Yes Ostwalt, Janna, PA-C  metoprolol  tartrate (LOPRESSOR ) 25 MG tablet Take 0.5 tablets (12.5 mg total) by mouth 2 (two) times daily. 10/09/23  Yes Vivienne Lonni Ingle, NP  metoprolol  tartrate (LOPRESSOR ) 25 MG tablet Take 0.5 tablets (12.5 mg total) by mouth 2 (two) times daily. 11/15/23  Yes Vivienne Lonni Ingle, NP  pantoprazole  (PROTONIX ) 20 MG tablet Take 1 tablet (20 mg total) by mouth daily. 09/23/23  Yes Jinny Carmine, MD  ranolazine  (RANEXA ) 1000 MG SR tablet Take 1 tablet (1,000 mg total) by mouth 2 (two) times daily. 11/15/23  Yes Vivienne Lonni Ingle, NP  rosuvastatin  (CRESTOR ) 40 MG tablet Take 1 tablet (40 mg total) by mouth daily. 10/18/23  Yes Hammock, Tylene, NP  tacrolimus  (PROTOPIC ) 0.1 % ointment Apply twice daily to affected areas as needed 07/11/20  Yes   clobetasol  ointment (TEMOVATE ) 0.05 % Apply 1 Application topically 2 (two) times daily until smooth, then stop and use as needed. 04/17/22     desonide  (DESOWEN ) 0.05 % cream Apply 1 application topically 2 (two) times daily as needed (for psoriasis). 10/24/20   Chrismon, Marinda BRAVO, PA-C  desonide  (DESOWEN ) 0.05 % cream Apply 1 Application topically 2 (two) times daily to affected areas on face and neck when flared. 04/17/22     ketoconazole  (NIZORAL ) 2 % shampoo Apply 1 Application topically to affected area (leave on for 5 minutes) , rinse well, use 2-3 times per week. 04/17/22     nitroGLYCERIN  (NITROSTAT ) 0.4 MG SL tablet PLACE 1 TABLET UNDER THE TONGUE EVERY 5 MINUTES AS NEEDED. MAY REPEAT FOR UP TO 3 DOSES. IF NO RELIEF WITH 1ST DOSE, CALL 911. 09/20/21 10/09/23   Gollan, Timothy J, MD    Allergies as of 11/26/2023 - Review Complete 11/14/2023  Allergen Reaction Noted   Strawberry extract Anaphylaxis 12/04/2011   Dilaudid [hydromorphone hcl] Nausea And Vomiting 06/21/2010    Family History  Problem Relation Age of Onset   Hypertension Mother    Heart disease Mother    Psychiatric Illness Father    Heart attack Father    Diabetes Sister    Ovarian cancer Sister    Lung cancer Paternal Aunt    Cancer Paternal Uncle    Heart disease Paternal Grandmother    Heart disease Paternal Grandfather    Diabetes  Other    Coronary artery disease Other    Diabetes Other    Hypertension Other    Hyperlipidemia Other    Sleep apnea Neg Hx     Social History   Socioeconomic History   Marital status: Married    Spouse name: Othel   Number of children: 3   Years of education: Not on file   Highest education level: Some college, no degree  Occupational History   Occupation: Full time    Employer: [employer]  Tobacco Use   Smoking status: Never   Smokeless tobacco: Never  Vaping Use   Vaping status: Never Used  Substance and Sexual Activity   Alcohol use: Yes    Comment: occassionally   Drug use: Never   Sexual activity: Yes    Birth control/protection: None  Other Topics Concern   Not on file  Social History Narrative   Married Naval architect) ,Scientist, 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 He has worked at his present job for 22 years. He has been married for 34 years. They have 3 children. 2 of his daughters live in [place]. He drinks less than two cups of caffeine per day. He does not use  tobacco or recreational drugs. He has rare alcohol intake. Lives with wife and daughter      OSA diagnosed at [facility] , had his  sleep studies there  reviewed them all in detail today he  has retrognathia, sinusitis,  rhinitis      His ESS remains very high  at 16  and FSS at 90 . His falls assessment tool score is 5.   nasonex, refitted mask, the recent  HST failed to document that  apnea is present at all. education about REM BD and EDS, narcolepsy , cataplexy.  45 minutes.    Social Drivers of Corporate investment banker Strain: Low Risk  (03/03/2023)   Overall Financial Resource Strain (CARDIA)    Difficulty of Paying Living Expenses: Not hard at all  Food Insecurity: No Food Insecurity (03/03/2023)   Hunger Vital Sign    Worried About Running Out of Food in the Last Year: Never true    Ran Out of Food in the Last Year: Never true  Transportation Needs: No Transportation Needs (03/03/2023)   PRAPARE - Administrator, Civil Service (Medical): No    Lack of Transportation (Non-Medical): No  Physical Activity: Unknown (03/03/2023)   Exercise Vital Sign    Days of Exercise per Week: 0 days    Minutes of Exercise per Session: Not on file  Stress: No Stress Concern Present (03/03/2023)   [employer] of Occupational Health - Occupational Stress Questionnaire    Feeling of Stress : Not at all  Social Connections: Unknown (03/03/2023)   Social Connection and Isolation Panel    Frequency of Communication with Friends and Family: More than three times a week    Frequency of Social Gatherings with Friends and Family: More than three times a week    Attends Religious Services: Patient declined    Database administrator or Organizations: No    Attends Engineer, structural: Not on file    Marital Status: Married  Catering manager Violence: Not on file    Review of Systems: See HPI, otherwise negative ROS  Physical Exam: BP 127/78   Pulse 63   Temp 97.6 F (36.4 C) (Temporal)  Resp 18   Ht 5' 6 (1.676 m)   Wt 91.2 kg   SpO2 98%   BMI 32.44 kg/m  General:   Alert,  pleasant and cooperative in NAD Head:  Normocephalic and atraumatic. Lungs:  Clear to auscultation.    Heart:  Regular rate and rhythm.    Impression/Plan: Kevin [name] is here for ophthalmic surgery.  Risks, benefits, limitations, and alternatives regarding ophthalmic surgery have been reviewed with the patient.  Questions have been answered.  All parties agreeable.   MITTIE GASKIN, MD  01/22/2024, 9:23 AM

## 2024-01-22 NOTE — Transfer of Care (Signed)
 Immediate Anesthesia Transfer of Care Note  Patient: Kevin [name]  Procedure(s) Performed: PHACOEMULSIFICATION, CATARACT, WITH IOL INSERTION 6.90 00:49.3 (Right: Eye)  Patient Location: PACU  Anesthesia Type: MAC  Level of Consciousness: awake, alert  and patient cooperative  Airway and Oxygen Therapy: Patient Spontanous Breathing   Post-op Assessment: Post-op Vital signs reviewed, Patient's Cardiovascular Status Stable, Respiratory Function Stable, Patent Airway and No signs of Nausea or vomiting  Post-op Vital Signs: Reviewed and stable  Complications: No notable events documented.

## 2024-01-22 NOTE — Anesthesia Postprocedure Evaluation (Signed)
 Anesthesia Post Note  Patient: Kevin [name]  Procedure(s) Performed: PHACOEMULSIFICATION, CATARACT, WITH IOL INSERTION 6.90 00:49.3 (Right: Eye)  Patient location during evaluation: PACU Anesthesia Type: MAC Level of consciousness: awake and alert Pain management: pain level controlled Vital Signs Assessment: post-procedure vital signs reviewed and stable Respiratory status: spontaneous breathing, nonlabored ventilation, respiratory function stable and patient connected to nasal cannula oxygen Cardiovascular status: stable and blood pressure returned to baseline Postop Assessment: no apparent nausea or vomiting Anesthetic complications: no   No notable events documented.   Last Vitals:  Vitals:   01/22/24 1010 01/22/24 1013  BP:  130/81  Pulse: 61 64  Resp: (!) 8 12  Temp: 36.8 C   SpO2: 96% 96%    Last Pain:  Vitals:   01/22/24 1013  TempSrc:   PainSc: 0-No pain                 Kevin [name]

## 2024-01-23 ENCOUNTER — Other Ambulatory Visit (HOSPITAL_COMMUNITY): Payer: Self-pay

## 2024-01-24 ENCOUNTER — Other Ambulatory Visit (HOSPITAL_COMMUNITY): Payer: Self-pay

## 2024-01-24 ENCOUNTER — Other Ambulatory Visit: Payer: Self-pay

## 2024-01-27 NOTE — Telephone Encounter (Signed)
 Called [employer] they informed me their turn around time is 01/30/24 to hear back about approval for [id number].

## 2024-01-29 NOTE — Telephone Encounter (Signed)
 Noted

## 2024-01-29 NOTE — Telephone Encounter (Signed)
 Spoke with pt's wife (on [place]). She understands Dr Chalice will not be able to read a study done at [facility]. She has agreed for patient to come to [facility] for the study. She is aware the sleep lab will be calling her to schedule the study once insurance has approved and they can answer her more detailed questions about the study process during that call. She was appreciative.

## 2024-02-03 NOTE — Telephone Encounter (Signed)
 Sent mychart

## 2024-02-03 NOTE — Discharge Instructions (Signed)

## 2024-02-03 NOTE — Telephone Encounter (Signed)
 [employer] auth: [id number] (exp. 01/16/24 to 04/01/24)

## 2024-02-05 ENCOUNTER — Other Ambulatory Visit: Payer: Self-pay

## 2024-02-05 ENCOUNTER — Ambulatory Visit: Admitting: Registered Nurse

## 2024-02-05 ENCOUNTER — Ambulatory Visit
Admission: RE | Admit: 2024-02-05 | Discharge: 2024-02-05 | Disposition: A | Discharge status: Home / Self Care | Attending: Ophthalmology | Admitting: Ophthalmology

## 2024-02-05 ENCOUNTER — Encounter: Payer: Self-pay | Admitting: Ophthalmology

## 2024-02-05 ENCOUNTER — Encounter
Admission: RE | Disposition: A | Discharge status: Home / Self Care | Payer: Self-pay | Source: Home / Self Care | Attending: Ophthalmology

## 2024-02-05 DIAGNOSIS — G4733 Obstructive sleep apnea (adult) (pediatric): Secondary | ICD-10-CM | POA: Insufficient documentation

## 2024-02-05 DIAGNOSIS — M1990 Unspecified osteoarthritis, unspecified site: Secondary | ICD-10-CM | POA: Diagnosis not present

## 2024-02-05 DIAGNOSIS — F419 Anxiety disorder, unspecified: Secondary | ICD-10-CM | POA: Insufficient documentation

## 2024-02-05 DIAGNOSIS — N183 Chronic kidney disease, stage 3 unspecified: Secondary | ICD-10-CM | POA: Diagnosis not present

## 2024-02-05 DIAGNOSIS — Z79899 Other long term (current) drug therapy: Secondary | ICD-10-CM | POA: Diagnosis not present

## 2024-02-05 DIAGNOSIS — I480 Paroxysmal atrial fibrillation: Secondary | ICD-10-CM | POA: Diagnosis not present

## 2024-02-05 DIAGNOSIS — Z955 Presence of coronary angioplasty implant and graft: Secondary | ICD-10-CM | POA: Insufficient documentation

## 2024-02-05 DIAGNOSIS — Z951 Presence of aortocoronary bypass graft: Secondary | ICD-10-CM | POA: Insufficient documentation

## 2024-02-05 DIAGNOSIS — E785 Hyperlipidemia, unspecified: Secondary | ICD-10-CM | POA: Insufficient documentation

## 2024-02-05 DIAGNOSIS — Z8774 Personal history of (corrected) congenital malformations of heart and circulatory system: Secondary | ICD-10-CM | POA: Insufficient documentation

## 2024-02-05 DIAGNOSIS — I129 Hypertensive chronic kidney disease with stage 1 through stage 4 chronic kidney disease, or unspecified chronic kidney disease: Secondary | ICD-10-CM | POA: Diagnosis not present

## 2024-02-05 DIAGNOSIS — J45909 Unspecified asthma, uncomplicated: Secondary | ICD-10-CM | POA: Insufficient documentation

## 2024-02-05 DIAGNOSIS — I25119 Atherosclerotic heart disease of native coronary artery with unspecified angina pectoris: Secondary | ICD-10-CM | POA: Diagnosis not present

## 2024-02-05 DIAGNOSIS — H2512 Age-related nuclear cataract, left eye: Secondary | ICD-10-CM | POA: Insufficient documentation

## 2024-02-05 DIAGNOSIS — I69311 Memory deficit following cerebral infarction: Secondary | ICD-10-CM | POA: Diagnosis not present

## 2024-02-05 DIAGNOSIS — F32A Depression, unspecified: Secondary | ICD-10-CM | POA: Diagnosis not present

## 2024-02-05 DIAGNOSIS — K219 Gastro-esophageal reflux disease without esophagitis: Secondary | ICD-10-CM | POA: Diagnosis not present

## 2024-02-05 HISTORY — PX: CATARACT EXTRACTION W/PHACO: SHX586

## 2024-02-05 SURGERY — PHACOEMULSIFICATION, CATARACT, WITH IOL INSERTION
Anesthesia: Monitor Anesthesia Care | Site: Eye | Laterality: Left

## 2024-02-05 MED ORDER — PHENYLEPHRINE HCL 10 % OP SOLN
OPHTHALMIC | Status: AC
  Filled 2024-02-05: qty 5

## 2024-02-05 MED ORDER — LIDOCAINE HCL (PF) 2 % IJ SOLN
INTRAOCULAR | Status: DC | PRN
  Administered 2024-02-05: 2 mL

## 2024-02-05 MED ORDER — SIGHTPATH DOSE#1 BSS IO SOLN
INTRAOCULAR | Status: DC | PRN
Start: 2024-02-05 — End: 2024-02-05
  Administered 2024-02-05: 15 mL via INTRAOCULAR

## 2024-02-05 MED ORDER — PHENYLEPHRINE HCL 10 % OP SOLN
1.0000 [drp] | OPHTHALMIC | Status: AC
  Administered 2024-02-05 (×3): 1 [drp] via OPHTHALMIC

## 2024-02-05 MED ORDER — TETRACAINE HCL 0.5 % OP SOLN
1.0000 [drp] | OPHTHALMIC | Status: DC | PRN
  Administered 2024-02-05 (×3): 1 [drp] via OPHTHALMIC

## 2024-02-05 MED ORDER — MIDAZOLAM HCL 2 MG/2ML IJ SOLN
INTRAMUSCULAR | Status: AC
  Filled 2024-02-05: qty 2

## 2024-02-05 MED ORDER — EPINEPHRINE PF 1 MG/ML IJ SOLN
INTRAMUSCULAR | Status: DC | PRN
  Administered 2024-02-05: 80 mL via OPHTHALMIC

## 2024-02-05 MED ORDER — TETRACAINE HCL 0.5 % OP SOLN
OPHTHALMIC | Status: AC
  Filled 2024-02-05: qty 4

## 2024-02-05 MED ORDER — CEFUROXIME OPHTHALMIC INJECTION 1 MG/0.1 ML
INJECTION | OPHTHALMIC | Status: DC | PRN
  Administered 2024-02-05: .1 mL via INTRACAMERAL

## 2024-02-05 MED ORDER — CYCLOPENTOLATE HCL 2 % OP SOLN
OPHTHALMIC | Status: AC
  Filled 2024-02-05: qty 2

## 2024-02-05 MED ORDER — FENTANYL CITRATE (PF) 100 MCG/2ML IJ SOLN
INTRAMUSCULAR | Status: AC
  Filled 2024-02-05: qty 2

## 2024-02-05 MED ORDER — MIDAZOLAM HCL (PF) 2 MG/2ML IJ SOLN
INTRAMUSCULAR | Status: DC | PRN
  Administered 2024-02-05: 1 mg via INTRAVENOUS

## 2024-02-05 MED ORDER — CYCLOPENTOLATE HCL 2 % OP SOLN
1.0000 [drp] | OPHTHALMIC | Status: AC
  Administered 2024-02-05 (×3): 1 [drp] via OPHTHALMIC

## 2024-02-05 MED ORDER — SIGHTPATH DOSE#1 NA HYALUR & NA CHOND-NA HYALUR IO KIT
PACK | INTRAOCULAR | Status: DC | PRN
  Administered 2024-02-05: 1 via OPHTHALMIC

## 2024-02-05 MED ORDER — FENTANYL CITRATE (PF) 100 MCG/2ML IJ SOLN
INTRAMUSCULAR | Status: DC | PRN
  Administered 2024-02-05: 50 ug via INTRAVENOUS

## 2024-02-05 MED ORDER — BRIMONIDINE TARTRATE-TIMOLOL 0.2-0.5 % OP SOLN
OPHTHALMIC | Status: DC | PRN
Start: 2024-02-05 — End: 2024-02-05
  Administered 2024-02-05: 1 [drp] via OPHTHALMIC

## 2024-02-05 SURGICAL SUPPLY — 8 items
FEE CATARACT SUITE SIGHTPATH (MISCELLANEOUS) ×2 IMPLANT
GLOVE BIOGEL PI IND STRL 8 (GLOVE) ×2 IMPLANT
GLOVE SURG LX STRL 7.5 STRW (GLOVE) ×2 IMPLANT
GLOVE SURG SYN 6.5 PF PI BL (GLOVE) ×2 IMPLANT
LENS IOL CLRN PAN PRO TORIC 11 IMPLANT
NDL FILTER BLUNT 18X1 1/2 (NEEDLE) ×2 IMPLANT
NEEDLE FILTER BLUNT 18X1 1/2 (NEEDLE) ×1 IMPLANT
SYR 3ML LL SCALE MARK (SYRINGE) ×2 IMPLANT

## 2024-02-05 NOTE — H&P (Signed)
 [facility]   Primary Care Physician:  Ostwalt, Janna, PA-C Ophthalmologist: Dr. Dene Etienne  Pre-Procedure History & Physical: HPI:  Kevin [name] is a 68 y.o. male here for ophthalmic surgery.   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 05/2008 CABGx4: LIMA-LAD, SVG-Diag, sequential SVG-OM1/OM2; b. 10/2009 s/p PCI/DES x 2 to LAD; c. 03/2013 Cath: patent LAD stents; d. 10/2016 Cath: LM min irregs, LAD 20p, 41m ISR, D2 80ost, LCX min irregs, OM1/2/3 min irregs, RCA/RPDA/RPAV min irregs, RPL1/2 nl RPL3 min irregs, VG->D2 nl, VG->OM1->OM2 100, EF 55-65%; e. 12/2017 MV: distal inflat/apical scar, no isch; f. 09/2021 MV: No isch. EF 63%.   Depression 1988   DOE (dyspnea on exertion)    GERD (gastroesophageal reflux disease)    H/O maze procedure 05/17/2008   a. 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    Hypertension    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Schatzki)    Memory deficit after cerebral infarction    Mesenteric ischemia    Mild cognitive impairment    Obesity    OSA on CPAP    Overweight(278.02)    PAF (paroxysmal atrial fibrillation) (HCC)    a. s/p Maze 05/2008, previously on Eliquis->discontinued 05/2016; c. CHADS2VASc => 4 (HTN, stroke x 2, vascular disease)   Patent foramen ovale    a. 05/2008 s/p closure @ time of CABG.   Psoriasis    S/P CABG x 4 2010   Skin cancer    Sleep apnea     Stable angina    Stroke [facility]) 2007   a. 07/2005 right brain; b. 05/2005 left brain; c. Resultant memory deficits.2007 two strokes , in March affecting the left and in May 2007 affecting right hemisphere   Thrombocytopenia     Past Surgical History:  Procedure Laterality Date   ACUTE PANCREATITIS  5/11   APPENDECTOMY  1984   CARDIAC CATHETERIZATION     CATARACT EXTRACTION W/PHACO Right 01/22/2024   Procedure: PHACOEMULSIFICATION, CATARACT, WITH IOL INSERTION 6.90 00:49.3;  Surgeon: Etienne Dene, MD;  Location: [facility];  Service: Ophthalmology;  Laterality: Right;   CHOLECYSTECTOMY  08/19/2009   COLONOSCOPY WITH PROPOFOL  N/A 06/07/2017   Procedure: COLONOSCOPY WITH PROPOFOL ;  Surgeon: Janalyn Keene NOVAK, MD;  Location: [facility] ENDOSCOPY;  Service: Endoscopy;  Laterality: N/A;   CORONARY ARTERY BYPASS GRAFT  05/12/2008   x4 by PeterVan Trigt,MD   ESOPHAGOGASTRODUODENOSCOPY N/A 03/03/2020   Procedure: ESOPHAGOGASTRODUODENOSCOPY (EGD);  Surgeon: Janalyn Keene NOVAK, MD;  Location: [facility] ENDOSCOPY;  Service: Endoscopy;  Laterality: N/A;   LEFT HEART CATH AND CORONARY ANGIOGRAPHY N/A 11/29/2016   Procedure: LEFT HEART CATH AND CORONARY ANGIOGRAPHY;  Surgeon: Darron Deatrice LABOR, MD;  Location: [facility] INVASIVE CV LAB;  Service: Cardiovascular;  Laterality: N/A;   PATENT FORAMEN  OVALE CLOSURE     TONSILLECTOMY     AS A CHILD    Prior to Admission medications   Medication Sig Start Date End Date Taking? Authorizing Provider  albuterol  (VENTOLIN  HFA) 108 (90 Base) MCG/ACT inhaler Inhale 2 puffs into the lungs every 6 (six) hours as needed for wheezing or shortness of breath. 10/01/20  Yes Kennyth Domino, FNP  ALPRAZolam  (XANAX ) 0.5 MG tablet Take 1 tablet (0.5 mg total) by mouth at bedtime. 11/20/23  Yes Ostwalt, Janna, PA-C  ARTHRITIS PAIN RELIEF 650 MG CR tablet Take 650 mg by mouth every 8 (eight) hours as needed. 08/05/19  Yes [provider]  aspirin  EC 81 MG tablet Take 81 mg  by mouth daily.   Yes [provider]  cetirizine (ZYRTEC) 10 MG tablet Take 10 mg by mouth at bedtime.   Yes [provider]  clobetasol  ointment (TEMOVATE ) 0.05 % Apply 1 Application topically 2 (two) times daily until smooth, then stop and use as needed. 04/17/22  Yes   desonide  (DESOWEN ) 0.05 % cream Apply 1 application topically 2 (two) times daily as needed (for psoriasis). 10/24/20  Yes Chrismon, Marinda BRAVO, PA-C  desonide  (DESOWEN ) 0.05 % cream Apply 1 Application topically 2 (two) times daily to affected areas on face and neck when flared. 04/17/22  Yes   ezetimibe  (ZETIA ) 10 MG tablet Take 1 tablet (10 mg total) by mouth daily. 10/18/23  Yes Hammock, Tylene, NP  isosorbide  mononitrate (IMDUR ) 30 MG 24 hr tablet Take 1 tablet (30 mg total) by mouth daily. 10/18/23  Yes Hammock, Sheri, NP  ketoconazole  (NIZORAL ) 2 % shampoo Apply 1 Application topically to affected area (leave on for 5 minutes) , rinse well, use 2-3 times per week. 04/17/22  Yes   LEXAPRO  20 MG tablet Take 1 tablet (20 mg total) by mouth daily. Must make appointment for refills 11/15/23  Yes Ostwalt, Janna, PA-C  metoprolol  tartrate (LOPRESSOR ) 25 MG tablet Take 0.5 tablets (12.5 mg total) by mouth 2 (two) times daily. 10/09/23  Yes Vivienne Lonni Ingle, NP  metoprolol  tartrate (LOPRESSOR ) 25 MG tablet Take 0.5 tablets (12.5 mg total) by mouth 2 (two) times daily. 11/15/23  Yes Vivienne Lonni Ingle, NP  pantoprazole  (PROTONIX ) 20 MG tablet Take 1 tablet (20 mg total) by mouth daily. 09/23/23  Yes Jinny Carmine, MD  ranolazine  (RANEXA ) 1000 MG SR tablet Take 1 tablet (1,000 mg total) by mouth 2 (two) times daily. 11/15/23  Yes Vivienne Lonni Ingle, NP  rosuvastatin  (CRESTOR ) 40 MG tablet Take 1 tablet (40 mg total) by mouth daily. 10/18/23  Yes Hammock, Tylene, NP  tacrolimus  (PROTOPIC ) 0.1 % ointment Apply twice daily to affected areas as needed 07/11/20  Yes   clopidogrel  (PLAVIX ) 75 MG tablet Take 1 tablet (75  mg total) by mouth daily. 07/08/23   Hammock, Tylene, NP  nitroGLYCERIN  (NITROSTAT ) 0.4 MG SL tablet PLACE 1 TABLET UNDER THE TONGUE EVERY 5 MINUTES AS NEEDED. MAY REPEAT FOR UP TO 3 DOSES. IF NO RELIEF WITH 1ST DOSE, CALL 911. 09/20/21 10/09/23  Gollan, Timothy J, MD    Allergies as of 11/26/2023 - Review Complete 11/14/2023  Allergen Reaction Noted   Strawberry extract Anaphylaxis 12/04/2011   Dilaudid [hydromorphone hcl] Nausea And Vomiting 06/21/2010    Family History  Problem Relation Age of Onset   Hypertension Mother    Heart disease Mother    Psychiatric Illness Father    Heart attack Father    Diabetes Sister    Ovarian  cancer Sister    Lung cancer Paternal Aunt    Cancer Paternal Uncle    Heart disease Paternal Grandmother    Heart disease Paternal Grandfather    Diabetes Other    Coronary artery disease Other    Diabetes Other    Hypertension Other    Hyperlipidemia Other    Sleep apnea Neg Hx     Social History   Socioeconomic History   Marital status: Married    Spouse name: Othel   Number of children: 3   Years of education: Not on file   Highest education level: Some college, no degree  Occupational History   Occupation: Full time    Employer: [employer]  Tobacco Use   Smoking status: Never   Smokeless tobacco: Never  Vaping Use   Vaping status: Never Used  Substance and Sexual Activity   Alcohol use: Yes    Comment: occassionally   Drug use: Never   Sexual activity: Yes    Birth control/protection: None  Other Topics Concern   Not on file  Social History Narrative   Married Naval Architect) ,Scientist, 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 He has worked at his present job for 22 years. He has been married for 34 years. They have 3 children. 2 of his daughters live in [place]. He drinks less than two cups of caffeine per day. He does not use  tobacco or recreational drugs. He  has rare alcohol intake. Lives with wife and daughter      OSA diagnosed at [facility] , had his  sleep studies there  reviewed them all in detail today he  has retrognathia, sinusitis,  rhinitis      His ESS remains very high  at 16 and FSS at 10 . His falls assessment tool score is 5.   nasonex, refitted mask, the recent  HST failed to document that  apnea is present at all. education about REM BD and EDS, narcolepsy , cataplexy.  45 minutes.    Social Drivers of Corporate Investment Banker Strain: Low Risk  (03/03/2023)   Overall Financial Resource Strain (CARDIA)    Difficulty of Paying Living Expenses: Not hard at all  Food Insecurity: No Food Insecurity (03/03/2023)   Hunger Vital Sign    Worried About Running Out of Food in the Last Year: Never true    Ran Out of Food in the Last Year: Never true  Transportation Needs: No Transportation Needs (03/03/2023)   PRAPARE - Administrator, Civil Service (Medical): No    Lack of Transportation (Non-Medical): No  Physical Activity: Unknown (03/03/2023)   Exercise Vital Sign    Days of Exercise per Week: 0 days    Minutes of Exercise per Session: Not on file  Stress: No Stress Concern Present (03/03/2023)   [employer] of Occupational Health - Occupational Stress Questionnaire    Feeling of Stress : Not at all  Social Connections: Unknown (03/03/2023)   Social Connection and Isolation Panel    Frequency of Communication with Friends and Family: More than three times a week    Frequency of Social Gatherings with Friends and Family: More than three times a week    Attends Religious Services: Patient declined    Database Administrator or Organizations: No    Attends Engineer, Structural: Not on file    Marital Status: Married  Intimate  Partner Violence: Not on file    Review of Systems: See HPI, otherwise negative ROS  Physical Exam: BP 122/77   Pulse (!) 55   Temp 98.2 F (36.8 C) (Temporal)   Resp  17   Ht 5' 6 (1.676 m)   Wt 91 kg   SpO2 97%   BMI 32.39 kg/m  General:   Alert,  pleasant and cooperative in NAD Head:  Normocephalic and atraumatic. Lungs:  Clear to auscultation.    Heart:  Regular rate and rhythm.   Impression/Plan: Kevin [name] is here for ophthalmic surgery.  Risks, benefits, limitations, and alternatives regarding ophthalmic surgery have been reviewed with the patient.  Questions have been answered.  All parties agreeable.   MITTIE GASKIN, MD  02/05/2024, 9:23 AM

## 2024-02-05 NOTE — Anesthesia Postprocedure Evaluation (Signed)
 Anesthesia Post Note  Patient: Kevin [name]  Procedure(s) Performed: PHACOEMULSIFICATION, CATARACT, WITH IOL INSERTION 3.74 00:28.5 (Left: Eye)  Patient location during evaluation: PACU Anesthesia Type: MAC Level of consciousness: awake and alert Pain management: pain level controlled Vital Signs Assessment: post-procedure vital signs reviewed and stable Respiratory status: spontaneous breathing, nonlabored ventilation, respiratory function stable and patient connected to nasal cannula oxygen Cardiovascular status: stable and blood pressure returned to baseline Postop Assessment: no apparent nausea or vomiting Anesthetic complications: no   No notable events documented.   Last Vitals:  Vitals:   02/05/24 1035 02/05/24 1039  BP: 138/73 123/83  Pulse: (!) 57 (!) 57  Resp: 14 14  Temp: (!) 36.2 C (!) 36.2 C  SpO2: 98% 96%    Last Pain:  Vitals:   02/05/24 1039  TempSrc:   PainSc: 0-No pain                 Prentice Murphy

## 2024-02-05 NOTE — Anesthesia Preprocedure Evaluation (Signed)
 Anesthesia Evaluation  Patient identified by MRN, date of birth, ID band Patient awake    Reviewed: Allergy & Precautions, H&P , NPO status , Patient's Chart, lab work & pertinent test results  Airway Mallampati: IV  TM Distance: <3 FB Neck ROM: Full   Comment: Short, thick neck, very short thyromental distance, OSA on CPAP Dental no notable dental hx.    Pulmonary neg pulmonary ROS, asthma , sleep apnea    Pulmonary exam normal breath sounds clear to auscultation       Cardiovascular hypertension, + angina  + CAD and + DOE  negative cardio ROS Normal cardiovascular exam Rhythm:Regular Rate:Normal  Wife is cardiac stepdown nurse  Patient is male with a history of CAD status post four-vessel bypass and subsequent LAD stenting, paroxysmal atrial fibrillation status post Maze procedure, PFO s/p closure, stroke with resultant memory deficits, hypertension, hyperlipidemia, CKD 3, and obstructive sleep apnea.  Cardiac history dates back to February 2010, when he underwent CABG x4. At the time, he also underwent maze and PFO closure. Due to recurrent angina, he required diagnostic catheterization in August 2011 revealing an atretic LIMA to the LAD and an occlusion of the sequential graft to the OM1 and OM 2. At that time, he underwent PCI and drug-eluting stent placement to the native LAD. He has had intermittent chest pain over the years and has been maintained on long-acting nitrate and Ranexa  therapy. He underwent diagnostic catheterization in August 2018 due to recurrent rest and exertional chest discomfort. This showed stable anatomy with patent LAD stents as well as patency of the vein graft to the diagonal and native RCA.    In the setting of recurrent chest pain, he underwent stress testing in September 2019 and again in July 2023, both times showing normal LV function without ischemia.   Mr. Klees was last seen in cardiology clinic in May  2024, at which time he was doing well.  Over the past year, he has cont to do well.  He cont to work and is very active at his job w/o symptoms or limitations.  He has a long h/o intermittent chest tightness that occurs most evenings, and is overall unchanged.  He also has chronic dyspnea on exertion at higher levels of activity such as when he drags his to garbage cans up his driveway, though both sets of symptoms resolved in short order.  His heart rate is 49 today.  He denies any presyncope/syncope, palpitations, PND, orthopnea, edema, or early satiety  ECG personally reviewed by me today - EKG Interpretation Date/Time:              Wednesday October 09 2023 09:04:51 EDT Ventricular Rate:    49 PR Interval:             210 QRS Duration:                  102 QT Interval:             474 QTC Calculation:428 R Axis:                               206   Text Interpretation:Sinus bradycardia First degree A-V block Right superior axis deviation When compared with ECG of 24-Mar-2017 17:13, Junctional rhythm has replaced Sinus rhythm QRS axis Shifted left T wave inversion no longer evident in Anterior leads Reconfirmed by Vivienne Bruckner  1.  CAD status post CABG  x 4 in 2010 with subsequent drug-eluting stent placement to the LAD in August 2011 (LIMA to the LAD was atretic at that time).  He has somewhat chronic intermittent chest discomfort occurring most evenings, typically with rest, which is unchanged dating back to stress testing in July 2023.  He is very active throughout his day without symptoms or limitations and overall, has been doing well.  He remains on aspirin , Plavix , nitrate, Zetia , Ranexa , and statin therapy.  In the setting of sinus bradycardia with a first-degree AV block and heart rate of 49, I am reducing his metoprolol  to 12.5 mg twice daily.  11-29-16 cath   The left ventricular systolic function is normal.  LV end diastolic pressure is mildly elevated.  The left ventricular  ejection fraction is 55-65% by visual estimate.  Mid LAD lesion, 15 %stenosed.  Prox LAD lesion, 20 %stenosed.  Ost 2nd Diag to 2nd Diag lesion, 80 %stenosed.  SVG and is normal in caliber.  The graft exhibits minimal luminal irregularities.  SVG.  Origin lesion, 100 %stenosed.   1. Significant underlying one-vessel coronary artery disease with patent SVG to second diagonal, patent LAD stent with mild in-stent restenosis and known occluded SVG to OM. Native RCA and left circumflex don't have obstructive disease.   2. Normal LV systolic function and mildly elevated left ventricular end-diastolic pressure.       Neuro/Psych  PSYCHIATRIC DISORDERS Anxiety Depression    2007 two strokes , in March affecting the left and in May 2007 affecting right hemisphere. He has typical right brain deficits based on his neuropsychological testing. He had been a patient of Dr Rosemarie after the strokes  Neuromuscular disease CVA negative neurological ROS  negative psych ROS   GI/Hepatic negative GI ROS, Neg liver ROS,GERD  ,,  Endo/Other  negative endocrine ROS    Renal/GU Renal diseasenegative Renal ROS  negative genitourinary   Musculoskeletal negative musculoskeletal ROS (+) Arthritis ,    Abdominal   Peds negative pediatric ROS (+)  Hematology negative hematology ROS (+)   Anesthesia Other Findings Hyperlipidemia  Sleep apnea Thrombocytopenia  Overweight(278.02) Stroke (HCC) Hypertension Coronary artery disease H/O maze procedure  Brachial plexopathy Psoriatic arthritis (HCC) Hypersomnia, organic PAF (paroxysmal atrial fibrillation) (HCC)  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), stage II - III  Skin cancer Allergy Depression Asthma  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  OSA on CPAP Stable angina Atherosclerosis of native coronary artery of native heart, unspecified whether angina  present  Psoriasis 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[place]atz[place])  Mild cognitive impairment Anxiety  Obesity Mesenteric ischemia  History of maze procedure Brachial plexopathy  History of surgical closure of patent foramen ovale (PFO) during CABG S/P CABG x 4 History of heart artery stent DOE (dyspnea on exertion)     Reproductive/Obstetrics negative OB ROS                              Anesthesia Physical Anesthesia Plan  ASA: 3  Anesthesia Plan: MAC   Post-op Pain Management:    Induction: Intravenous  PONV Risk Score and Plan:   Airway Management Planned: Natural Airway and Nasal Cannula  Additional Equipment:   Intra-op Plan:   Post-operative Plan:   Informed Consent: I have reviewed the patients History and Physical, chart, labs and discussed the procedure including the risks, benefits and alternatives for the proposed anesthesia with the patient or authorized  representative who has indicated his/her understanding and acceptance.     Dental Advisory Given  Plan Discussed with: Anesthesiologist, CRNA and Surgeon  Anesthesia Plan Comments: (Patient consented for risks of anesthesia including but not limited to:  - adverse reactions to medications - damage to eyes, teeth, lips or other oral mucosa - nerve damage due to positioning  - sore throat or hoarseness - Damage to heart, brain, nerves, lungs, other parts of body or loss of life  Patient voiced understanding and assent.)         Anesthesia Quick Evaluation

## 2024-02-05 NOTE — Op Note (Signed)
 LOCATION:  [facility]   PREOPERATIVE DIAGNOSIS:  Nuclear sclerotic cataract of the left eye.  H25.12  POSTOPERATIVE DIAGNOSIS:  Nuclear sclerotic cataract of the left eye.   PROCEDURE:  Phacoemulsification with Toric posterior chamber intraocular lens placement of the left eye.  Ultrasound time: Procedure(s): PHACOEMULSIFICATION, CATARACT, WITH IOL INSERTION 3.74 00:28.5 (Left)  LENS:   Implant Name Type Inv. Item Serial No. Manufacturer Lot No. LRB No. Used Action  LENS IOL CLRN PAN PRO TORIC 11 - [id number]  LENS IOL CLRN PAN PRO TORIC 11 [id number] [employer]  Left 1 Implanted     [id number] 11.0D Panoptix Toric intraocular lens with 1.5 diopters of cylindrical power with axis orientation at 36 degrees.     SURGEON:  Dene FABIENE Etienne, MD   ANESTHESIA:  Topical with tetracaine drops and 2% Xylocaine  jelly, augmented with 1% preservative-free intracameral lidocaine .  COMPLICATIONS:  None.   DESCRIPTION OF PROCEDURE:  The patient was identified in the holding room and transported to the operating suite and placed in the supine position under the operating microscope.  The left eye was identified as the operative eye, and it was prepped and draped in the usual sterile ophthalmic fashion.    A clear-corneal paracentesis incision was made at the 1:30 position.  0.5 ml of preservative-free 1% lidocaine  was injected into the anterior chamber. The anterior chamber was filled with Viscoat.  A 2.4 millimeter near clear corneal incision was then made at the 10:30 position.  A cystotome and capsulorrhexis forceps were then used to make a curvilinear capsulorrhexis.  Hydrodissection and hydrodelineation were then performed using balanced salt solution.   Phacoemulsification was then used in stop and chop fashion to remove the lens, nucleus and epinucleus.  The remaining cortex was aspirated using the irrigation and aspiration handpiece.  Provisc viscoelastic was then placed into the  capsular bag to distend it for lens placement.  The Verion digital marker was used to align the implant at the intended axis.   A Toric lens was then injected into the capsular bag.  It was rotated clockwise until the axis marks on the lens were approximately 15 degrees in the counterclockwise direction to the intended alignment.  The viscoelastic was aspirated from the eye using the irrigation aspiration handpiece.  Then, a Koch spatula through the sideport incision was used to rotate the lens in a clockwise direction until the axis markings of the intraocular lens were lined up with the Verion alignment.  Balanced salt solution was then used to hydrate the wounds. Cefuroxime 0.1 ml of a 10mg /ml solution was injected into the anterior chamber for a dose of 1 mg of intracameral antibiotic at the completion of the case.    The eye was noted to have a physiologic pressure and there was no wound leak noted.   Timolol and Brimonidine drops were applied to the eye.  The patient was taken to the recovery room in stable condition having had no complications of anesthesia or surgery.  Kevin [name] 02/05/2024, 10:35 AM

## 2024-02-05 NOTE — Transfer of Care (Signed)
 Immediate Anesthesia Transfer of Care Note  Patient: Kevin [name]  Procedure(s) Performed: PHACOEMULSIFICATION, CATARACT, WITH IOL INSERTION 3.74 00:28.5 (Left: Eye)  Patient Location: PACU  Anesthesia Type: MAC  Level of Consciousness: awake, alert  and patient cooperative  Airway and Oxygen Therapy: Patient Spontanous Breathing and Patient connected to supplemental oxygen  Post-op Assessment: Post-op Vital signs reviewed, Patient's Cardiovascular Status Stable, Respiratory Function Stable, Patent Airway and No signs of Nausea or vomiting  Post-op Vital Signs: Reviewed and stable  Complications: No notable events documented.

## 2024-02-07 ENCOUNTER — Ambulatory Visit: Admitting: Family Medicine

## 2024-02-07 ENCOUNTER — Other Ambulatory Visit (HOSPITAL_COMMUNITY): Payer: Self-pay

## 2024-02-07 ENCOUNTER — Encounter: Payer: Self-pay | Admitting: Family Medicine

## 2024-02-07 VITALS — BP 136/72 | HR 73 | Temp 98.4°F | Ht 66.0 in | Wt 203.8 lb

## 2024-02-07 DIAGNOSIS — I480 Paroxysmal atrial fibrillation: Secondary | ICD-10-CM

## 2024-02-07 DIAGNOSIS — E782 Mixed hyperlipidemia: Secondary | ICD-10-CM | POA: Diagnosis not present

## 2024-02-07 DIAGNOSIS — Z0000 Encounter for general adult medical examination without abnormal findings: Secondary | ICD-10-CM

## 2024-02-07 DIAGNOSIS — Z79899 Other long term (current) drug therapy: Secondary | ICD-10-CM

## 2024-02-07 DIAGNOSIS — D539 Nutritional anemia, unspecified: Secondary | ICD-10-CM

## 2024-02-07 DIAGNOSIS — R7301 Impaired fasting glucose: Secondary | ICD-10-CM

## 2024-02-07 DIAGNOSIS — J392 Other diseases of pharynx: Secondary | ICD-10-CM

## 2024-02-07 DIAGNOSIS — G4733 Obstructive sleep apnea (adult) (pediatric): Secondary | ICD-10-CM

## 2024-02-07 DIAGNOSIS — Z23 Encounter for immunization: Secondary | ICD-10-CM

## 2024-02-07 DIAGNOSIS — F419 Anxiety disorder, unspecified: Secondary | ICD-10-CM

## 2024-02-07 DIAGNOSIS — K5730 Diverticulosis of large intestine without perforation or abscess without bleeding: Secondary | ICD-10-CM

## 2024-02-07 DIAGNOSIS — N1831 Chronic kidney disease, stage 3a: Secondary | ICD-10-CM | POA: Diagnosis not present

## 2024-02-07 DIAGNOSIS — I25708 Atherosclerosis of coronary artery bypass graft(s), unspecified, with other forms of angina pectoris: Secondary | ICD-10-CM | POA: Diagnosis not present

## 2024-02-07 DIAGNOSIS — K219 Gastro-esophageal reflux disease without esophagitis: Secondary | ICD-10-CM

## 2024-02-07 MED ORDER — LEXAPRO 20 MG PO TABS
20.0000 mg | ORAL_TABLET | Freq: Every day | ORAL | 3 refills | Status: AC
  Filled 2024-02-07 – 2024-02-17 (×2): qty 90, 90d supply, fill #0

## 2024-02-07 NOTE — Assessment & Plan Note (Signed)
 On ranolazine . Fairly well-controlled stable angina currently. Follows with cardiology; defer to specialist management.

## 2024-02-07 NOTE — Progress Notes (Signed)
 Subjective:   Kevin [name] is a 68 y.o. male who presents for a Welcome to Medicare Exam.    Kevin [name] is a 68 year old male who presents for a Medicare annual wellness visit. He is accompanied by his wife, Susanna.  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 He describes his diet as a mix of home-cooked meals and dining out, with efforts to improve meal preparation at home. No current pain is reported.  He has a history of coronary artery disease, having undergone bypass surgery and stent placement. He experiences stable angina, managed with medications including ranolazine , which have kept his angina under control. He also had a PFO closure and a maze procedure following atrial fibrillation and strokes in 2007. He uses a CPAP machine daily for sleep apnea.  He takes pantoprazole  for gastrointestinal issues and has a history of diverticulitis. A scheduled colonoscopy was not completed due to changes at his GI provider's office. No recent gastrointestinal symptoms such as nausea, vomiting, or significant changes in bowel habits are reported.  He is on Lexapro  (escitalopram ) for depression, which works well for him, and he has had issues with generic versions in the past. He also takes Xanax  (alprazolam ) at bedtime since his strokes, which he has recently reduced to half the dose. He is considering further reduction but is cautious about potential irritability.  He recently underwent cataract surgery on both eyes, with the most recent procedure two days ago. He reports improved vision post-surgery. No recent respiratory symptoms, changes in appetite, or energy levels.  He has a history of anemia, which he attributes to his use of Plavix  and aspirin . No significant swelling or tenderness in his calves is reported. He experienced hip pain earlier this year due to a change in gait after a foot injury, which has since improved with new  orthotics.  He is on a regimen that includes Plavix  and aspirin , and he avoids NSAIDs. He was fasting for his last blood work.  He experiences tinnitus as a result of his strokes, which affects his hearing. No significant numbness or tingling is reported, though he occasionally feels pressure on his heart when lying on his back or left side.     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 05/2008 CABGx4: LIMA-LAD, SVG-Diag, sequential SVG-OM1/OM2; b. 10/2009 s/p PCI/DES x 2 to LAD; c. 03/2013 Cath: patent LAD stents; d. 10/2016 Cath: LM min irregs, LAD 20p, 51m ISR, D2 80ost, LCX min irregs, OM1/2/3 min irregs, RCA/RPDA/RPAV min irregs, RPL1/2 nl RPL3 min irregs, VG->D2 nl, VG->OM1->OM2 100, EF 55-65%; e. 12/2017 MV: distal inflat/apical scar, no isch; f. 09/2021 MV: No isch. EF 63%.   Depression 1988   DOE (dyspnea on exertion)    GERD (gastroesophageal reflux disease)    H/O maze procedure 05/17/2008   a. 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    Hypertension    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[place])    Memory deficit after cerebral infarction    Mesenteric ischemia    Mild cognitive impairment    Obesity    OSA on CPAP    Overweight(278.02)    PAF (paroxysmal atrial fibrillation) (HCC)    a. s/p  Maze 05/2008, previously on Eliquis->discontinued 05/2016; c. CHADS2VASc => 4 (HTN, stroke x 2, vascular disease)   Patent foramen ovale    a. 05/2008 s/p closure @ time of CABG.   Psoriasis    S/P CABG x 4 2010   Skin cancer    Sleep apnea    Stable angina    Stroke [facility]) 2007   a. 07/2005 right brain; b. 05/2005 left brain; c. Resultant memory deficits.2007 two strokes , in March affecting the left and in May 2007 affecting right hemisphere   Thrombocytopenia    Past Surgical History:  Procedure Laterality Date   ACUTE PANCREATITIS  5/11   APPENDECTOMY  1984   CARDIAC CATHETERIZATION     CATARACT EXTRACTION W/PHACO Right 01/22/2024   Procedure: PHACOEMULSIFICATION, CATARACT, WITH IOL INSERTION 6.90 00:49.3;  Surgeon: Mittie Gaskin, MD;  Location: [facility];  Service: Ophthalmology;  Laterality: Right;   CATARACT EXTRACTION W/PHACO Left 02/05/2024   Procedure: PHACOEMULSIFICATION, CATARACT, WITH IOL INSERTION 3.74 00:28.5;  Surgeon: Mittie Gaskin, MD;  Location: [facility];  Service: Ophthalmology;  Laterality: Left;   CHOLECYSTECTOMY  08/19/2009   COLONOSCOPY WITH PROPOFOL  N/A 06/07/2017   Procedure: COLONOSCOPY WITH PROPOFOL ;  Surgeon: Janalyn Keene NOVAK, MD;  Location: [facility] ENDOSCOPY;  Service: Endoscopy;  Laterality: N/A;   CORONARY ARTERY BYPASS GRAFT  05/12/2008   x4 by PeterVan Trigt,MD   ESOPHAGOGASTRODUODENOSCOPY N/A 03/03/2020   Procedure: ESOPHAGOGASTRODUODENOSCOPY (EGD);  Surgeon: Janalyn Keene NOVAK, MD;  Location: [facility] END[facility];  Service: Endoscopy;  Laterality: N/A;   LEFT HEART CATH AND CORONARY ANGIOGRAPHY N/A 11/29/2016   Procedure: LEFT HEART CATH AND CORONARY ANGIOGRAPHY;  Surgeon: Darron Deatrice LABOR, MD;  Location: [facility] INVASIVE CV LAB;  Service: Cardiovascular;  Laterality: N/A;   PATENT FORAMEN OVALE CLOSURE     TONSILLECTOMY     AS A CHILD   Family History  Problem Relation Age of Onset   Hypertension Mother    Heart disease  Mother    Psychiatric Illness Father    Heart attack Father    Diabetes Sister    Ovarian cancer Sister    Lung cancer Paternal Aunt    Cancer Paternal Uncle    Heart disease Paternal Grandmother    Heart disease Paternal Grandfather    Diabetes Other    Coronary artery disease Other    Diabetes Other    Hypertension Other    Hyperlipidemia Other    Sleep apnea Neg Hx    Social History   Occupational History   Occupation: Full time    Employer: [employer]  Tobacco Use   Smoking status: Never   Smokeless tobacco: Never  Vaping Use   Vaping status: Never Used  Substance and Sexual Activity   Alcohol use: Yes    Comment: occassionally   Drug use: Never   Sexual activity: Yes    Birth control/protection: None   Tobacco Counseling  Counseling given: N/A  SDOH Screenings   Food Insecurity: No Food Insecurity (03/03/2023)  Housing: Low Risk  (03/03/2023)  Transportation Needs: No Transportation Needs (03/03/2023)  Alcohol Screen: Low Risk  (01/31/2021)  Depression (PHQ2-9): Low Risk  (02/07/2024)  Financial Resource Strain: Low Risk  (03/03/2023)  Physical Activity: Unknown (03/03/2023)  Social Connections: Unknown (03/03/2023)  Stress: No Stress Concern Present (03/03/2023)  Tobacco Use: Low Risk  (02/07/2024)   Depression Screen    02/07/2024    2:02 PM 03/04/2023    8:45 AM 04/30/2022    2:04 PM 01/31/2021    1:55 PM 10/24/2020    1:33 PM 03/15/2020    1:10 PM 09/14/2019    1:49 PM  PHQ 2/9 Scores  PHQ - 2 Score 0 0 0 0 0 0 0  PHQ- 9 Score 0  4  0  0  0  0      Data saved with a previous flowsheet row definition     Goals Addressed   None    Visit info / Clinical Intake: Interpreter Needed?: No  Functional Status Activities of Daily Living (to include ambulation/medication): Independent Ambulation: Independent Medication Administration: Independent Home Management: Independent  Fall Screening Falls in the past year?: 1 Number of falls in past  year: 0 Was there an injury with Fall?: 1 (Knee, hands) Fall Risk Category Calculator: 2 Patient Fall Risk Level: Moderate Fall Risk  Fall Risk Patient at Risk for Falls Due to: No Fall Risks Fall risk Follow up: Falls evaluation completed  Cognitive Assessment What year is it?: 0 points What month is it?: 0 points Give patient an address phrase to remember (5 components): [place] Shar[place] [address] About what time is it?: 0 points Count backwards from 20 to 1: 0 points Say the months of the year in reverse: 0 points Repeat the address phrase from earlier: 0 points 6 CIT Score: 0 points  Advance Directives (For Healthcare) Does Patient Have a Medical Advance Directive?: Yes Does patient want to make changes to medical advance directive?: No - Patient declined Type of Advance Directive: Healthcare Power of [place]; Living will Copy of Healthcare Power of Attorney in Chart?: No - copy requested Copy of Living Will in Chart?: No - copy requested Would patient like information on creating a medical advance directive?: No - Patient declined        Objective:    Today's Vitals   02/07/24 1354  BP: 136/72  Pulse: 73  Temp: 98.4 F (36.9 C)  TempSrc: Oral  SpO2: 98%  Weight: 203 lb 12.8 oz (92.4 kg)  Height: 5' 6 (1.676 m)   Body mass index is 32.89 kg/m.   Physical Exam Vitals and nursing note reviewed.  Constitutional:      General: He is awake.     Appearance: Normal appearance.  HENT:     Head: Normocephalic and atraumatic.     Right Ear: Tympanic membrane, ear canal and external ear normal.     Left Ear: Tympanic membrane, ear canal and external ear normal.     Nose: Nose normal.     Mouth/Throat:     Mouth: Mucous membranes are moist.     Pharynx: Oropharynx is clear. No oropharyngeal exudate or posterior oropharyngeal erythema.  Eyes:     General: No scleral icterus.    Extraocular Movements: Extraocular movements intact.     Conjunctiva/sclera:  Conjunctivae normal.     Pupils: Pupils are equal, round, and reactive to light.  Neck:     Thyroid: No thyromegaly or thyroid tenderness.  Cardiovascular:     Rate and Rhythm: Normal rate and regular rhythm.     Pulses: Normal pulses.     Heart sounds: Normal heart sounds.  Pulmonary:     Effort: Pulmonary effort is normal. No tachypnea, bradypnea or respiratory distress.     Breath sounds: Normal breath sounds. No stridor. No wheezing, rhonchi or rales.  Abdominal:     General: Bowel sounds are normal. There is no distension.     Palpations: Abdomen is soft. There is no mass.     Tenderness: There is no abdominal tenderness. There is no guarding.     Hernia: No hernia is present.  Musculoskeletal:     Cervical back: Normal range of motion and neck supple.     Right lower leg: No edema.     Left lower leg: No edema.  Lymphadenopathy:     Cervical: No cervical adenopathy.  Skin:    General: Skin is warm and dry.  Neurological:     Mental Status: He is alert and oriented to person, place, and time. Mental status is at baseline.  Psychiatric:        Mood and Affect: Mood normal.        Behavior: Behavior normal.     Current Medications (verified) Outpatient Encounter Medications as of 02/07/2024  Medication Sig   ALPRAZolam  (XANAX ) 0.5 MG tablet Take 1 tablet (0.5 mg total) by mouth at bedtime. (Patient taking differently: Take 0.5 mg by mouth at bedtime. Taking only half)   ARTHRITIS PAIN RELIEF 650 MG CR tablet Take 650 mg by mouth every 8 (eight) hours as needed.   aspirin  EC 81 MG tablet Take 81 mg by mouth daily.   cetirizine (ZYRTEC) 10 MG tablet Take 10 mg by mouth at bedtime.   clobetasol  ointment (TEMOVATE ) 0.05 % Apply 1 Application topically 2 (two) times daily until smooth, then stop and use as needed.   clopidogrel  (PLAVIX ) 75 MG tablet Take 1 tablet (75 mg total) by mouth daily.   desonide  (DESOWEN ) 0.05 % cream Apply 1 application topically 2 (two) times daily as  needed (for psoriasis).   desonide  (DESOWEN ) 0.05 % cream Apply 1 Application topically 2 (two) times daily to affected areas on face and neck when flared.   ezetimibe  (ZETIA ) 10 MG tablet Take 1 tablet (10 mg total) by mouth daily.   isosorbide  mononitrate (IMDUR ) 30 MG 24 hr tablet Take 1 tablet (30 mg total) by mouth daily.   ketoconazole  (NIZORAL ) 2 % shampoo Apply 1 Application topically to affected area (leave on for 5 minutes) , rinse well, use 2-3 times per week.   metoprolol  tartrate (LOPRESSOR ) 25 MG tablet Take 0.5 tablets (12.5 mg total) by mouth 2 (two) times daily.   nitroGLYCERIN  (NITROSTAT ) 0.4 MG SL tablet PLACE 1 TABLET UNDER THE TONGUE EVERY 5 MINUTES AS NEEDED. MAY REPEAT FOR UP TO 3 DOSES. IF NO RELIEF WITH 1ST DOSE, CALL 911.   pantoprazole  (PROTONIX ) 20 MG tablet Take 1 tablet (20 mg total) by mouth daily.   ranolazine  (RANEXA ) 1000 MG SR tablet Take 1 tablet (1,000 mg total) by mouth 2 (two) times daily.   rosuvastatin  (CRESTOR ) 40 MG tablet Take 1 tablet (40 mg total) by mouth daily.   tacrolimus  (PROTOPIC ) 0.1 % ointment Apply twice daily to affected areas as needed   [DISCONTINUED] albuterol  (VENTOLIN  HFA) 108 (90 Base) MCG/ACT inhaler Inhale 2 puffs into the  lungs every 6 (six) hours as needed for wheezing or shortness of breath.   [DISCONTINUED] LEXAPRO  20 MG tablet Take 1 tablet (20 mg total) by mouth daily. Must make appointment for refills   LEXAPRO  20 MG tablet Take 1 tablet (20 mg total) by mouth daily. Must make appointment for refills   [DISCONTINUED] metoprolol  tartrate (LOPRESSOR ) 25 MG tablet Take 0.5 tablets (12.5 mg total) by mouth 2 (two) times daily.   No facility-administered encounter medications on file as of 02/07/2024.   Hearing/Vision screen No results found. Immunizations and Health Maintenance Health Maintenance  Topic Date Due   COVID-19 Vaccine (7 - 2025-26 season) 02/23/2024 (Originally 12/02/2023)   Influenza Vaccine  06/30/2024 (Originally  11/01/2023)   DTaP/Tdap/Td (1 - Tdap) 02/06/2025 (Originally 09/12/1974)   Medicare Annual Wellness (AWV)  02/06/2025   Colonoscopy  06/08/2027   Pneumococcal Vaccine: 50+ Years  Completed   Hepatitis C Screening  Completed   Zoster Vaccines- Shingrix   Completed   Meningococcal B Vaccine  Aged Out    EKG: Not done today; done July 2025 by cardiology.     Assessment/Plan:  This is a routine wellness examination for Gracen.   Welcome to Medicare preventive visit  Atherosclerosis of coronary artery bypass graft of native heart with stable angina pectoris Assessment & Plan: On ranolazine . Fairly well-controlled stable angina currently. Follows with cardiology; defer to specialist management.   Orders: -     Lipid panel  Paroxysmal atrial fibrillation (HCC)  Stage 3a chronic kidney disease (HCC) -     Microalbumin / creatinine urine ratio -     Comprehensive metabolic panel with GFR -     VITAMIN D 25 Hydroxy (Vit-D Deficiency, Fractures)  Mixed hyperlipidemia -     Lipid panel  Gastroesophageal reflux disease, unspecified whether esophagitis present  High risk medication use  Diverticulosis of large intestine without diverticulitis -     Ambulatory referral to Gastroenterology  Elevated fasting glucose -     Hemoglobin A1c  Macrocytic anemia -     CBC -     B12 and Folate Panel  Anxiety -     Lexapro ; Take 1 tablet (20 mg total) by mouth daily. Must make appointment for refills  Dispense: 90 tablet; Refill: 3  OSA on CPAP  Oropharyngeal lesion  Need for influenza vaccination      Welcome to medicare preventative visit Annual wellness visit conducted. Physical exam overall unremarkable except as noted above. Routine lab work ordered as noted.  Blood pressure slightly elevated but not concerning.  Discussion of upcoming vaccinations including flu shot, COVID booster, and tetanus vaccine. Plan to postpone flu shot due to recent cataract surgery. - Postponed flu  shot for 1-2 weeks. - Plan for COVID booster and tetanus vaccine at pharmacy.  - Monitor blood pressure at home. - Encouraged regular follow-up with cardiology.  Atherosclerosis of coronary artery bypass graft(s) of native heart with stable angina pectoris Coronary artery bypass grafts with stent placement. Angina managed with ranolazine  and Imdur . No recent chest pain or shortness of breath. - Continue ranolazine  and Imdur . - Monitor blood pressure at home. - Follow up with cardiology as scheduled.  Defer to specialist management.  Paroxysmal atrial fibrillation Paroxysmal atrial fibrillation, status post maze procedure and two strokes. Currently on clopidogrel , rosuvastatin  and aspirin . No recent episodes of atrial fibrillation reported. - Continue clopidogrel , rosuvastatin  and aspirin .  Stage 3a chronic kidney disease Chronic kidney disease stage 3a, likely related to atherosclerosis. Discussed potential use  of medications like Jardiance for kidney protection, but no immediate changes planned. - Avoid NSAIDs. - Encouraged healthy diet and hydration. - Ordered blood work to monitor kidney function.  Mixed hyperlipidemia Managed with current medications. - Continue current lipid-lowering therapy. - Encouraged healthy diet and lifestyle modifications.  Gastroesophageal reflux disease Managed with pantoprazole . No recent exacerbations reported. - Continue pantoprazole .  Diverticulosis of large intestine without diverticulitis Diverticulosis with previous diverticulitis. No recent episodes reported. - Continue current management and monitor for symptoms.  Elevated fasting glucose Impaired fasting glucose. - Encouraged healthy diet and lifestyle modifications.  Microcytic anemia Likely related to chronic use of pantoprazole  and Plavix . No acute symptoms reported. - Ordered blood work to assess anemia status. - Encouraged dietary intake of iron-rich foods.  Anxiety Anxiety  disorder managed with escitalopram  and Xanax . Plan to taper Xanax  due to concerns about cognitive effects. He is willing to attempt tapering. - Continue escitalopram . - Attempt to taper Xanax  by skipping doses gradually.  OSA on CPAP Obstructive sleep apnea managed with CPAP. No issues reported with CPAP use. - Continue CPAP therapy.  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 No acute changes reported. - Consider Debrox for ear wax removal.  Cataract, status post recent surgery Status post recent cataract surgery with improved vision. - Postpone flu shot for 1-2 weeks to ensure full recovery from cataract surgery.  Oropharyngeal lesion  White lesion in oropharynx. Differential includes leukoplakia or cholesterol deposit. - Referred to ENT for evaluation of oropharyngeal lesion.    Patient Care Team: Donzella Lauraine SAILOR, DO as PCP - General (Family Medicine) Perla Evalene PARAS, MD as PCP - Cardiology (Cardiology) Perla Evalene PARAS, MD as Consulting Physician (Cardiology) Vivienne Lonni Ingle, NP as Nurse Practitioner (Cardiology)  I have personally reviewed and noted the following in the patient's chart:   Medical and social history Use of alcohol, tobacco or illicit drugs  Current medications and supplements including opioid prescriptions. Functional ability and status Nutritional status Physical activity Advanced directives List of other physicians Hospitalizations, surgeries, and ER visits in previous 12 months Vitals Screenings to include cognitive, depression, and falls Referrals and appointments Patient is not currently on any opioid medications   Orders Placed This Encounter  Procedures   Microalbumin / creatinine urine ratio   Comprehensive metabolic panel with GFR   Hemoglobin A1c   Lipid panel   CBC   B12 and Folate Panel   VITAMIN D 25 Hydroxy (Vit-D Deficiency, Fractures)   Ambulatory referral to  Gastroenterology    Referral Priority:   Routine    Referral Type:   Consultation    Referral Reason:   Specialty Services Required    Number of Visits Requested:   1   In addition, I have reviewed and discussed with patient certain preventive protocols, quality metrics, and best practice recommendations. A written personalized care plan for preventive services as well as general preventive health recommendations were provided to patient.   Talyn Eddie N Primrose Oler, DO   02/17/2024   Return in about 6 months (around 08/06/2024), or or sooner as needed, for Chronic f/u, plus AWV with AWV nurse in 366 days.

## 2024-02-07 NOTE — Patient Instructions (Signed)
 Recommended vaccines to get at pharmacy: flu shot, covid booster and Tdap (tetanus, diphtheria and pertussis).

## 2024-02-10 ENCOUNTER — Other Ambulatory Visit (HOSPITAL_COMMUNITY): Payer: Self-pay

## 2024-02-13 ENCOUNTER — Other Ambulatory Visit: Payer: Self-pay

## 2024-02-13 LAB — COMPREHENSIVE METABOLIC PANEL WITH GFR
ALT: 13 IU/L (ref 0–44)
AST: 19 IU/L (ref 0–40)
Albumin: 4.5 g/dL (ref 3.9–4.9)
Alkaline Phosphatase: 44 IU/L — ABNORMAL LOW (ref 47–123)
BUN/Creatinine Ratio: 14 (ref 10–24)
BUN: 21 mg/dL (ref 8–27)
Bilirubin Total: 0.7 mg/dL (ref 0.0–1.2)
CO2: 25 mmol/L (ref 20–29)
Calcium: 9.4 mg/dL (ref 8.6–10.2)
Chloride: 102 mmol/L (ref 96–106)
Creatinine, Ser: 1.52 mg/dL — ABNORMAL HIGH (ref 0.76–1.27)
Globulin, Total: 2.4 g/dL (ref 1.5–4.5)
Glucose: 107 mg/dL — ABNORMAL HIGH (ref 70–99)
Potassium: 4.6 mmol/L (ref 3.5–5.2)
Sodium: 141 mmol/L (ref 134–144)
Total Protein: 6.9 g/dL (ref 6.0–8.5)
eGFR: 49 mL/min/1.73 — ABNORMAL LOW (ref >59)

## 2024-02-13 LAB — CBC
Hematocrit: 41.5 % (ref 37.5–51.0)
Hemoglobin: 13.6 g/dL (ref 13.0–17.7)
MCH: 32.6 pg (ref 26.6–33.0)
MCHC: 32.8 g/dL (ref 31.5–35.7)
MCV: 100 fL — ABNORMAL HIGH (ref 79–97)
Platelets: 190 x10E3/uL (ref 150–450)
RBC: 4.17 x10E6/uL (ref 4.14–5.80)
RDW: 11.9 % (ref 11.6–15.4)
WBC: 5.5 x10E3/uL (ref 3.4–10.8)

## 2024-02-13 LAB — LIPID PANEL
Chol/HDL Ratio: 2.2 ratio (ref 0.0–5.0)
Cholesterol, Total: 115 mg/dL (ref 100–199)
HDL: 52 mg/dL (ref >39)
LDL Chol Calc (NIH): 44 mg/dL (ref 0–99)
Triglycerides: 101 mg/dL (ref 0–149)
VLDL Cholesterol Cal: 19 mg/dL (ref 5–40)

## 2024-02-13 LAB — B12 AND FOLATE PANEL
Folate: 5.4 ng/mL (ref >3.0)
Vitamin B-12: 288 pg/mL (ref 232–1245)

## 2024-02-13 LAB — MICROALBUMIN / CREATININE URINE RATIO
Creatinine, Urine: 211.5 mg/dL
Microalb/Creat Ratio: 3 mg/g{creat} (ref 0–29)
Microalbumin, Urine: 7 ug/mL

## 2024-02-13 LAB — HEMOGLOBIN A1C
Est. average glucose Bld gHb Est-mCnc: 100 mg/dL
Hgb A1c MFr Bld: 5.1 % (ref 4.8–5.6)

## 2024-02-13 LAB — VITAMIN D 25 HYDROXY (VIT D DEFICIENCY, FRACTURES): Vit D, 25-Hydroxy: 33 ng/mL (ref 30.0–100.0)

## 2024-02-13 MED ORDER — AMOXICILLIN 500 MG PO CAPS
2000.0000 mg | ORAL_CAPSULE | ORAL | 0 refills | Status: DC
  Filled 2024-02-13: qty 16, 4d supply, fill #0

## 2024-02-17 ENCOUNTER — Other Ambulatory Visit: Payer: Self-pay | Admitting: Physician Assistant

## 2024-02-17 ENCOUNTER — Other Ambulatory Visit: Payer: Self-pay

## 2024-02-17 ENCOUNTER — Other Ambulatory Visit (HOSPITAL_COMMUNITY): Payer: Self-pay

## 2024-02-17 ENCOUNTER — Encounter: Admitting: Family Medicine

## 2024-02-17 ENCOUNTER — Ambulatory Visit: Payer: Self-pay | Admitting: Family Medicine

## 2024-02-17 DIAGNOSIS — F419 Anxiety disorder, unspecified: Secondary | ICD-10-CM

## 2024-02-17 MED ORDER — AMOXICILLIN 500 MG PO CAPS
ORAL_CAPSULE | ORAL | 0 refills | Status: AC
  Filled 2024-02-17 (×2): qty 22, 7d supply, fill #0

## 2024-02-17 NOTE — Addendum Note (Signed)
 Addended by: DONZELLA DOMINO on: 02/17/2024 09:08 PM   Modules accepted: Level of Service

## 2024-02-18 ENCOUNTER — Other Ambulatory Visit: Payer: Self-pay

## 2024-02-19 NOTE — Telephone Encounter (Signed)
 LOV- 02/07/2024 NOV- 08/07/2024 LRF- 11/20/2023 Outpatient Medication Detail   Disp Refills Start End   ALPRAZolam  (XANAX ) 0.5 MG tablet 30 tablet 0 11/20/2023 --   Sig - Route: Take 1 tablet (0.5 mg total) by mouth at bedtime. - Oral   Patient taking differently: Take 0.5 mg by mouth at bedtime. Taking only half   Sent to pharmacy as: ALPRAZolam  (XANAX ) 0.5 MG tablet

## 2024-02-20 ENCOUNTER — Other Ambulatory Visit: Payer: Self-pay

## 2024-02-20 ENCOUNTER — Other Ambulatory Visit: Payer: Self-pay | Admitting: Family Medicine

## 2024-02-20 DIAGNOSIS — F419 Anxiety disorder, unspecified: Secondary | ICD-10-CM

## 2024-02-20 NOTE — Telephone Encounter (Signed)
 LOV- 02/07/2024 NOV- 08/07/2024 LRF- 11/20/2023 Outpatient Medication Detail   Disp Refills Start End   ALPRAZolam  (XANAX ) 0.5 MG tablet 30 tablet 0 11/20/2023 --   Sig - Route: Take 1 tablet (0.5 mg total) by mouth at bedtime. - Oral   Patient taking differently: Take 0.5 mg by mouth at bedtime. Taking only half   Sent to pharmacy as: ALPRAZolam  (XANAX ) 0.5 MG tablet

## 2024-02-21 ENCOUNTER — Encounter: Payer: Self-pay | Admitting: Family Medicine

## 2024-02-21 DIAGNOSIS — F419 Anxiety disorder, unspecified: Secondary | ICD-10-CM

## 2024-02-24 ENCOUNTER — Other Ambulatory Visit: Payer: Self-pay

## 2024-02-24 ENCOUNTER — Other Ambulatory Visit: Payer: Self-pay | Admitting: Family Medicine

## 2024-02-24 DIAGNOSIS — F419 Anxiety disorder, unspecified: Secondary | ICD-10-CM

## 2024-02-25 ENCOUNTER — Other Ambulatory Visit: Payer: Self-pay

## 2024-02-25 MED FILL — Alprazolam Tab 0.5 MG: ORAL | 30 days supply | Qty: 30 | Fill #0 | Status: AC

## 2024-03-06 ENCOUNTER — Other Ambulatory Visit (HOSPITAL_COMMUNITY): Payer: Self-pay

## 2024-03-10 ENCOUNTER — Encounter

## 2024-03-10 ENCOUNTER — Other Ambulatory Visit (HOSPITAL_COMMUNITY): Payer: Self-pay

## 2024-03-10 ENCOUNTER — Other Ambulatory Visit: Payer: Self-pay

## 2024-03-10 MED ORDER — AMOXICILLIN 500 MG PO CAPS
2000.0000 mg | ORAL_CAPSULE | Freq: Every day | ORAL | 0 refills | Status: DC
  Filled 2024-03-10 (×2): qty 20, 5d supply, fill #0

## 2024-03-16 ENCOUNTER — Other Ambulatory Visit: Payer: Self-pay | Admitting: Gastroenterology

## 2024-03-17 ENCOUNTER — Telehealth: Payer: Medicare Other | Admitting: Adult Health

## 2024-03-18 ENCOUNTER — Encounter

## 2024-03-21 ENCOUNTER — Other Ambulatory Visit: Payer: Self-pay | Admitting: Gastroenterology

## 2024-03-25 ENCOUNTER — Other Ambulatory Visit: Payer: Self-pay | Admitting: Cardiology

## 2024-03-25 ENCOUNTER — Other Ambulatory Visit: Payer: Self-pay | Admitting: Gastroenterology

## 2024-03-27 ENCOUNTER — Other Ambulatory Visit: Payer: Self-pay

## 2024-03-27 ENCOUNTER — Other Ambulatory Visit (HOSPITAL_COMMUNITY): Payer: Self-pay

## 2024-03-27 MED ORDER — CLOPIDOGREL BISULFATE 75 MG PO TABS
75.0000 mg | ORAL_TABLET | Freq: Every day | ORAL | 2 refills | Status: AC
  Filled 2024-03-27: qty 90, 90d supply, fill #0

## 2024-03-29 ENCOUNTER — Other Ambulatory Visit: Payer: Self-pay | Admitting: Gastroenterology

## 2024-03-30 ENCOUNTER — Other Ambulatory Visit (HOSPITAL_COMMUNITY): Payer: Self-pay

## 2024-03-30 ENCOUNTER — Telehealth: Payer: Self-pay

## 2024-03-30 ENCOUNTER — Other Ambulatory Visit: Payer: Self-pay | Admitting: Gastroenterology

## 2024-03-30 ENCOUNTER — Other Ambulatory Visit: Payer: Self-pay

## 2024-03-30 MED ORDER — PANTOPRAZOLE SODIUM 20 MG PO TBEC
20.0000 mg | DELAYED_RELEASE_TABLET | Freq: Every day | ORAL | 0 refills | Status: DC
  Filled 2024-03-30: qty 90, 90d supply, fill #0

## 2024-03-30 NOTE — Telephone Encounter (Signed)
 Patient and spouse called to schedule an appointment for medication refill. Appointment was scheduled with Kevin [name] on 05/07/24 at 2:30 PM.

## 2024-04-02 ENCOUNTER — Other Ambulatory Visit: Payer: Self-pay | Admitting: Physician Assistant

## 2024-04-02 DIAGNOSIS — F419 Anxiety disorder, unspecified: Secondary | ICD-10-CM

## 2024-04-03 ENCOUNTER — Other Ambulatory Visit: Payer: Self-pay

## 2024-04-03 ENCOUNTER — Other Ambulatory Visit: Payer: Self-pay | Admitting: Family Medicine

## 2024-04-03 DIAGNOSIS — F419 Anxiety disorder, unspecified: Secondary | ICD-10-CM

## 2024-04-06 ENCOUNTER — Other Ambulatory Visit: Payer: Self-pay

## 2024-04-06 NOTE — Telephone Encounter (Signed)
 Requested medication (s) are due for refill today: yes  Requested medication (s) are on the active medication list: yes  Last refill:  02/25/24  Future visit scheduled: yes  Notes to clinic:  Unable to refill per protocol, cannot delegate.      Requested Prescriptions  Pending Prescriptions Disp Refills   ALPRAZolam  (XANAX ) 0.5 MG tablet 30 tablet 0    Sig: Take 1 tablet (0.5 mg total) by mouth at bedtime.     Not Delegated - Psychiatry: Anxiolytics/Hypnotics 2 Failed - 04/06/2024 12:12 PM      Failed - This refill cannot be delegated      Failed - Urine Drug Screen completed in last 360 days      Passed - Patient is not pregnant      Passed - Valid encounter within last 6 months    Recent Outpatient Visits           1 month ago Welcome to [employer] preventive visit   [facility] [place], Lauraine SAILOR, DO

## 2024-04-07 ENCOUNTER — Other Ambulatory Visit: Payer: Self-pay

## 2024-04-07 ENCOUNTER — Telehealth: Payer: Self-pay | Admitting: Adult Health

## 2024-04-07 NOTE — Telephone Encounter (Signed)
 [employer] H[employer] pending new auth because the auth we had has expired.   He is r/s for 04/21/2024.

## 2024-04-12 ENCOUNTER — Other Ambulatory Visit: Payer: Self-pay | Admitting: Physician Assistant

## 2024-04-12 DIAGNOSIS — F419 Anxiety disorder, unspecified: Secondary | ICD-10-CM

## 2024-04-13 ENCOUNTER — Other Ambulatory Visit: Payer: Self-pay

## 2024-04-13 ENCOUNTER — Other Ambulatory Visit: Payer: Self-pay | Admitting: Family Medicine

## 2024-04-13 DIAGNOSIS — F419 Anxiety disorder, unspecified: Secondary | ICD-10-CM

## 2024-04-14 ENCOUNTER — Other Ambulatory Visit: Payer: Self-pay

## 2024-04-14 ENCOUNTER — Other Ambulatory Visit (HOSPITAL_COMMUNITY): Payer: Self-pay

## 2024-04-14 MED FILL — Alprazolam Tab 0.5 MG: ORAL | 30 days supply | Qty: 30 | Fill #0 | Status: AC

## 2024-04-16 NOTE — Telephone Encounter (Signed)
 Updated auth: [employer] H[employer] auth: [id number] (exp. 04/07/24 to 07/06/24)   Patient is schedule at [facility] for 04/22/23 at 9 pm

## 2024-04-17 ENCOUNTER — Other Ambulatory Visit: Payer: Self-pay

## 2024-04-21 ENCOUNTER — Ambulatory Visit: Admitting: Neurology

## 2024-04-21 DIAGNOSIS — G4730 Sleep apnea, unspecified: Secondary | ICD-10-CM

## 2024-04-21 DIAGNOSIS — G3184 Mild cognitive impairment, so stated: Secondary | ICD-10-CM

## 2024-04-21 DIAGNOSIS — G4733 Obstructive sleep apnea (adult) (pediatric): Secondary | ICD-10-CM | POA: Diagnosis not present

## 2024-04-21 DIAGNOSIS — I480 Paroxysmal atrial fibrillation: Secondary | ICD-10-CM

## 2024-04-21 DIAGNOSIS — G4710 Hypersomnia, unspecified: Secondary | ICD-10-CM

## 2024-04-21 DIAGNOSIS — I10 Essential (primary) hypertension: Secondary | ICD-10-CM

## 2024-04-24 ENCOUNTER — Encounter: Payer: Self-pay | Admitting: Nurse Practitioner

## 2024-04-24 ENCOUNTER — Ambulatory Visit: Attending: Nurse Practitioner | Admitting: Nurse Practitioner

## 2024-04-24 VITALS — BP 106/70 | HR 70 | Ht 66.0 in | Wt 198.1 lb

## 2024-04-24 DIAGNOSIS — I2510 Atherosclerotic heart disease of native coronary artery without angina pectoris: Secondary | ICD-10-CM | POA: Diagnosis not present

## 2024-04-24 DIAGNOSIS — I480 Paroxysmal atrial fibrillation: Secondary | ICD-10-CM | POA: Diagnosis not present

## 2024-04-24 DIAGNOSIS — E785 Hyperlipidemia, unspecified: Secondary | ICD-10-CM

## 2024-04-24 DIAGNOSIS — I10 Essential (primary) hypertension: Secondary | ICD-10-CM

## 2024-04-24 DIAGNOSIS — I639 Cerebral infarction, unspecified: Secondary | ICD-10-CM

## 2024-04-24 NOTE — Progress Notes (Signed)
 "    Office Visit    Patient Name: Kevin [name] Date of Encounter: 04/24/2024  Primary Care Provider:  Donzella Lauraine SAILOR, DO Primary Cardiologist:  Evalene Lunger, MD  Cardiology APP:  Vivienne Lonni Ingle, NP   Chief Complaint    69 y.o. male with a history of CAD status post four-vessel bypass and subsequent LAD stenting, paroxysmal atrial fibrillation status post Maze procedure, PFO s/p closure, stroke with resultant memory deficits, hypertension, hyperlipidemia, CKD 3, and obstructive sleep apnea, who presents for CAD follow-up.   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 05/2008 CABGx4: LIMA-LAD, SVG-Diag, sequential SVG-OM1/OM2; b. 10/2009 s/p PCI/DES x 2 to LAD; c. 03/2013 Cath: patent LAD stents; d. 10/2016 Cath: LM min irregs, LAD 20p, 33m ISR, D2 80ost, LCX min irregs, OM1/2/3 min irregs, RCA/RPDA/RPAV min irregs, RPL1/2 nl RPL3 min irregs, VG->D2 nl, VG->OM1->OM2 100, EF 55-65%; e. 12/2017 MV: distal inflat/apical scar, no isch; f. 09/2021 MV: No isch. EF 63%.   Depression 1988   DOE (dyspnea on exertion)    GERD (gastroesophageal reflux disease)    H/O maze procedure 05/17/2008   a. 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    Hypertension    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[place])    Memory deficit after cerebral infarction     Mesenteric ischemia    Mild cognitive impairment    Obesity    OSA on CPAP    Overweight(278.02)    PAF (paroxysmal atrial fibrillation) (HCC)    a. s/p Maze 05/2008, previously on Eliquis->discontinued 05/2016; c. CHADS2VASc => 4 (HTN, stroke x 2, vascular disease)   Patent foramen ovale    a. 05/2008 s/p closure @ time of CABG.   Psoriasis    S/P CABG x 4 2010   Skin cancer    Sleep apnea    Stable angina    Stroke [facility]) 2007   a. 07/2005 right brain; b. 05/2005 left brain; c. Resultant memory deficits.2007 two strokes , in March affecting the left and in May 2007 affecting right hemisphere   Thrombocytopenia    Past Surgical History:  Procedure Laterality Date   ACUTE PANCREATITIS  5/11   APPENDECTOMY  1984   CARDIAC CATHETERIZATION     CATARACT EXTRACTION W/PHACO Right 01/22/2024   Procedure: PHACOEMULSIFICATION, CATARACT, WITH IOL INSERTION 6.90 00:49.3;  Surgeon: Mittie Gaskin, MD;  Location[facility];  Service: Ophthalmology;  Laterality: Right;   CATARACT EXTRACTION W/PHACO Left 02/05/2024   Procedure: PHACOEMULSIFICATION, CATARACT, WITH IOL INSERTION 3.74 00:28.5;  Surgeon: Mittie Gaskin, MD;  Location: [facility];  Service: Ophthalmology;  Laterality: Left;   CHOLECYSTECTOMY  08/19/2009   COLONOSCOPY WITH PROPOFOL  N/A 06/07/2017  Procedure: COLONOSCOPY WITH PROPOFOL ;  Surgeon: Janalyn Keene NOVAK, MD;  Location: [facility] ENDOSCOPY;  Service: Endoscopy;  Laterality: N/A;   CORONARY ARTERY BYPASS GRAFT  05/12/2008   x4 by PeterVan Trigt,MD   ESOPHAGOGASTRODUODENOSCOPY N/A 03/03/2020   Procedure: ESOPHAGOGASTRODUODENOSCOPY (EGD);  Surgeon: Janalyn Keene NOVAK, MD;  Location: [facility] END[facility]COPY;  Service: Endoscopy;  Laterality: N/A;   LEFT HEART CATH AND CORONARY ANGIOGRAPHY N/A 11/29/2016   Procedure: LEFT HEART CATH AND CORONARY ANGIOGRAPHY;  Surgeon: Darron Deatrice LABOR, MD;  Location: [facility] INVASIVE CV LAB;  Service: Cardiovascular;  Laterality: N/A;    PATENT FORAMEN OVALE CLOSURE     TONSILLECTOMY     AS A CHILD    Allergies  Allergies[1]     History of Present Illness      69 y.o. y/o male with a history of CAD status post four-vessel bypass and subsequent LAD stenting, paroxysmal atrial fibrillation status post Maze procedure, PFO s/p closure, stroke with resultant memory deficits, hypertension, hyperlipidemia, CKD 3, and obstructive sleep apnea.  Cardiac history dates back to February 2010, when he underwent CABG x4. At the time, he also underwent maze and PFO closure. Due to recurrent angina, he required diagnostic catheterization in August 2011 revealing an atretic LIMA to the LAD and an occlusion of the sequential graft to the OM1 and OM 2. At that time, he underwent PCI and drug-eluting stent placement to the native LAD. He has had intermittent chest pain over the years and has been maintained on long-acting nitrate and Ranexa  therapy. He underwent diagnostic catheterization in August 2018 due to recurrent rest and exertional chest discomfort. This showed stable anatomy with patent LAD stents as well as patency of the vein graft to the diagonal and native RCA.   In the setting of recurrent chest pain, he underwent stress testing in September 2019 and again in July 2023, both times showing normal LV function without ischemia.     Kevin [name] was last seen in cardiology clinic in July 2025 at which time he noted stable, chronic dyspnea on exertion without chest pain.  In the setting of sinus bradycardia 49 with a first-degree AV block, his metoprolol  dose was reduced to 12.5 mg twice daily.  Since his last visit, Kevin [name] has continued to do well.  He does not routine exercise but is very active throughout the day.  He does not experience chest pain or dyspnea and denies palpitations, PND, orthopnea, dizziness, syncope, edema, or early satiety.  Objective   Home Medications    Current Outpatient Medications  Medication Sig Dispense  Refill   ALPRAZolam  (XANAX ) 0.5 MG tablet Take 0.5-1 tablets (0.25-0.5 mg total) by mouth at bedtime. 30 tablet 0   amoxicillin  (AMOXIL ) 500 MG capsule Take 4 capsules (2,000 mg total) by mouth 1 hour prior to dental appointment 20 capsule 0   ARTHRITIS PAIN RELIEF 650 MG CR tablet Take 650 mg by mouth every 8 (eight) hours as needed.     aspirin  EC 81 MG tablet Take 81 mg by mouth daily.     cetirizine (ZYRTEC) 10 MG tablet Take 10 mg by mouth at bedtime.     clobetasol  ointment (TEMOVATE ) 0.05 % Apply 1 Application topically 2 (two) times daily until smooth, then stop and use as needed. 30 g 2   clopidogrel  (PLAVIX ) 75 MG tablet Take 1 tablet (75 mg total) by mouth daily. 90 tablet 2   desonide  (DESOWEN ) 0.05 % cream Apply 1 application topically 2 (two) times daily as  needed (for psoriasis). 30 g 2   desonide  (DESOWEN ) 0.05 % cream Apply 1 Application topically 2 (two) times daily to affected areas on face and neck when flared. 30 g 2   ezetimibe  (ZETIA ) 10 MG tablet Take 1 tablet (10 mg total) by mouth daily. 90 tablet 3   isosorbide  mononitrate (IMDUR ) 30 MG 24 hr tablet Take 1 tablet (30 mg total) by mouth daily. 90 tablet 3   ketoconazole  (NIZORAL ) 2 % shampoo Apply 1 Application topically to affected area (leave on for 5 minutes) , rinse well, use 2-3 times per week. 120 mL 11   LEXAPRO  20 MG tablet Take 1 tablet (20 mg total) by mouth daily. Must make appointment for refills 90 tablet 3   metoprolol  tartrate (LOPRESSOR ) 25 MG tablet Take 0.5 tablets (12.5 mg total) by mouth 2 (two) times daily. 90 tablet 3   nitroGLYCERIN  (NITROSTAT ) 0.4 MG SL tablet PLACE 1 TABLET UNDER THE TONGUE EVERY 5 MINUTES AS NEEDED. MAY REPEAT FOR UP TO 3 DOSES. IF NO RELIEF WITH 1ST DOSE, CALL 911. 25 tablet 0   pantoprazole  (PROTONIX ) 20 MG tablet Take 1 tablet (20 mg total) by mouth daily. MUST SCHEDULE OFFICE VISIT FOR REFILLS 90 tablet 0   ranolazine  (RANEXA ) 1000 MG SR tablet Take 1 tablet (1,000 mg total) by  mouth 2 (two) times daily. 180 tablet 3   rosuvastatin  (CRESTOR ) 40 MG tablet Take 1 tablet (40 mg total) by mouth daily. 90 tablet 3   tacrolimus  (PROTOPIC ) 0.1 % ointment Apply twice daily to affected areas as needed 30 g 2   No current facility-administered medications for this visit.     Physical Exam    VS:  BP 106/70 (BP Location: Left Arm, Patient Position: Sitting, Cuff Size: Normal)   Pulse 70   Ht 5' 6 (1.676 m)   Wt 198 lb 2 oz (89.9 kg)   SpO2 98%   BMI 31.98 kg/m  , BMI Body mass index is 31.98 kg/m.          GEN: Well nourished, well developed, in no acute distress. HEENT: normal. Neck: Supple, no JVD, carotid bruits, or masses. Cardiac: RRR, no murmurs, rubs, or gallops. No clubbing, cyanosis, edema.  Radials 2+/PT 2+ and equal bilaterally.  Respiratory:  Respirations regular and unlabored, clear to auscultation bilaterally. GI: Soft, nontender, nondistended, BS + x 4. MS: no deformity or atrophy. Skin: warm and dry, no rash. Neuro:  Strength and sensation are intact. Psych: Normal affect.  Accessory Clinical Findings    ECG personally reviewed by me today - EKG Interpretation Date/Time:  Friday April 24 2024 15:41:07 EST Ventricular Rate:  70 PR Interval:  192 QRS Duration:  98 QT Interval:  430 QTC Calculation: 464 R Axis:   -25  Text Interpretation: Normal sinus rhythm Normal ECG Confirmed by Vivienne Bruckner [id number]) on 04/24/2024 3:46:28 PM  - no acute changes.  Lab Results  Component Value Date   WBC 5.5 02/12/2024   HGB 13.6 02/12/2024   HCT 41.5 02/12/2024   MCV 100 (H) 02/12/2024   PLT 190 02/12/2024   Lab Results  Component Value Date   CREATININE 1.52 (H) 02/12/2024   BUN 21 02/12/2024   NA 141 02/12/2024   K 4.6 02/12/2024   CL 102 02/12/2024   CO2 25 02/12/2024   Lab Results  Component Value Date   ALT 13 02/12/2024   AST 19 02/12/2024   ALKPHOS 44 (L) 02/12/2024   BILITOT 0.7 02/12/2024  Lab Results  Component Value  Date   CHOL 115 02/12/2024   HDL 52 02/12/2024   LDLCALC 44 02/12/2024   TRIG 101 02/12/2024   CHOLHDL 2.2 02/12/2024    Lab Results  Component Value Date   HGBA1C 5.1 02/12/2024   Lab Results  Component Value Date   TSH 2.210 12/08/2020       Assessment & Plan    1.  CAD: Status post CABG x 4 in 2010 with subsequent drug-eluting stent placement to the LAD in August 2011 (LIMA to LAD was atretic at that time).  Most recent stress test was in July 2023 and was low risk in the setting of somewhat chronic intermittent chest discomfort.  He has done well since his last visit without any chest pain or dyspnea.  Remains active without limitations.  He remains on aspirin , Plavix , nitrate, Zetia , Ranexa , and statin therapy.  2.  Paroxysmal atrial fibrillation: Status post Maze procedure at the time of his CABG in 2010.  Beta-blocker dose reduced in the setting of sinus bradycardia in July 2025.  No known recurrence.  He is on aspirin  and Plavix  only.  3.  Primary hypertension: Stable at 106/70 today.  Has been trending in the 120s at home since reducing beta-blocker dose.  No changes today.  4.  Hyperlipidemia: LDL cholesterol 44 in November with normal ALT and AST at that time.  He remains on statin and Zetia  therapy.  5.  History of stroke: History of memory loss but otherwise doing well.  Remains on aspirin , Plavix , and statin therapy.  6.  History of PFO: Status post closure.  7.  Disposition: Follow-up in 6 months or sooner if necessary.  Lonni Meager, NP 04/24/2024, 3:47 PM     [1]  Allergies Allergen Reactions   Strawberry Extract Anaphylaxis        Dilaudid [Hydromorphone Hcl] Nausea And Vomiting   "

## 2024-04-24 NOTE — Patient Instructions (Signed)
 Medication Instructions:  Your physician recommends that you continue on your current medications as directed. Please refer to the Current Medication list given to you today.   *If you need a refill on your cardiac medications before your next appointment, please call your pharmacy*  Lab Work: None ordered at this time  If you have labs (blood work) drawn today and your tests are completely normal, you will receive your results only by: MyChart Message (if you have MyChart) OR A paper copy in the mail If you have any lab test that is abnormal or we need to change your treatment, we will call you to review the results.  Testing/Procedures: None ordered at this time   Follow-Up: At [facility], you and your health needs are our priority.  As part of our continuing mission to provide you with exceptional heart care, our providers are all part of one team.  This team includes your primary Cardiologist (physician) and Advanced Practice Providers or APPs (Physician Assistants and Nurse Practitioners) who all work together to provide you with the care you need, when you need it.  Your next appointment:   6 month(s)  Provider:   Timothy Gollan, MD

## 2024-04-29 ENCOUNTER — Ambulatory Visit: Admitting: Nurse Practitioner

## 2024-05-04 ENCOUNTER — Ambulatory Visit: Payer: Self-pay | Admitting: Neurology

## 2024-05-04 DIAGNOSIS — G4730 Sleep apnea, unspecified: Secondary | ICD-10-CM

## 2024-05-04 DIAGNOSIS — I480 Paroxysmal atrial fibrillation: Secondary | ICD-10-CM

## 2024-05-06 NOTE — Progress Notes (Unsigned)
 "   05/07/2024 Kevin [name] [id number] [birth year]  Gastroenterology Office Note     Primary Care Physician:  Donzella Lauraine SAILOR, DO  Primary GI Provider: Celestia Rima, NP; Theophilus Aloysius Fines, MD    Chief Complaint   Chief Complaint  Patient presents with   New Patient (Initial Visit)    Medication refill Pantoprazole - no complaints today     History of Present Illness   Kevin [name] is a 69 y.o. male with PMHX of GERD, dysphagia, CVA presenting today for medication refill.   Discussed the use of AI scribe software for clinical note transcription with the patient, who gave verbal consent to proceed.  Patient accompanied by wife. Pantoprazole  20 mg once daily effectively controls his GERD symptoms. He typically takes the dose in the morning, 30 minutes before eating, though occasionally skips breakfast. He avoids sodas, drinks unsweet tea and coffee, and rarely consumes alcohol. He does not consume enough water daily. He has experienced difficulty obtaining timely refills in the past due to office staffing changes, but currently has several weeks of medication remaining.  He is due for repeat colonoscopy, with the last performed in 2019 and a five-year interval recommended. He occasionally experiences transient abdominal discomfort, but this is inconsistent. He denies rectal bleeding and reports daily bowel movements that are generally soft and formed, with occasional harder stools requiring straining, alternating with softer stools. He does not use fiber supplements or over-the-counter laxatives and does not consider constipation a major issue. He remains physically active, including working on a tractor for several hours at a time.  05/20/2023 Patient seen by Dr. Unk. Overdue for repeat colonoscopy. Chronic GERD, to continue on Protonix  20 mg daily.   Upper endoscopy 03/03/2020 - Mild Schatzki ring. Dilated to 20mm. - Z-line regular. - Normal esophagus. Biopsied. - Normal  stomach. Biopsied. - Normal duodenal bulb, second portion of the duodenum and examined duodenum. DIAGNOSIS:  A.  STOMACH; COLD BIOPSY:  - ANTRAL AND OXYNTIC MUCOSA WITHOUT PATHOLOGIC CHANGES.  - NEGATIVE FOR H. PYLORI, INTESTINAL METAPLASIA, DYSPLASIA, AND  MALIGNANCY.   B.  ESOPHAGUS; COLD BIOPSY:  - STRATIFIED SQUAMOUS EPITHELIUM WITHOUT EOSINOPHILS, NEUTROPHILS, OR  REACTIVE CHANGES.  - NEGATIVE FOR DYSPLASIA AND MALIGNANCY.   Colonoscopy 06/07/2017 - Diverticulosis in the sigmoid colon, in the descending colon and in the ascending colon. - The examination was otherwise normal. - The rectum, sigmoid colon, descending colon, transverse colon, ascending colon and cecum are normal. - Internal hemorrhoids. - No specimens collected. - Repeat in 5 years  Past Medical History:  Diagnosis Date   Allergy    Since child   Anxiety    Arthritis    Asthma Childhood   Atherosclerosis of native coronary artery of native heart, unspecified whether angina present    Brachial plexopathy    Brachial plexopathy    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 (HCC)    CKD (chronic kidney disease), stage II - III (HCC)    Clotting disorder    Coronary artery disease    a. 05/2008 CABGx4: LIMA-LAD, SVG-Diag, sequential SVG-OM1/OM2; b. 10/2009 s/p PCI/DES x 2 to LAD; c. 03/2013 Cath: patent LAD stents; d. 10/2016 Cath: LM min irregs, LAD 20p, 51m ISR, D2 80ost, LCX min irregs, OM1/2/3 min irregs, RCA/RPDA/RPAV min irregs, RPL1/2 nl RPL3 min irregs, VG->D2 nl, VG->OM1->OM2 100, EF 55-65%; e. 12/2017 MV: distal inflat/apical scar, no isch; f. 09/2021 MV: No isch. EF 63%.   Depression 1988  DOE (dyspnea on exertion)    GERD (gastroesophageal reflux disease)    H/O maze procedure 05/17/2008   a. 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    Hypertension    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Schatzki)    Memory deficit after cerebral infarction    Mesenteric ischemia    Mild cognitive impairment    Obesity    OSA on CPAP    Overweight(278.02)    PAF (paroxysmal atrial fibrillation) (HCC)    a. s/p Maze 05/2008, previously on Eliquis->discontinued 05/2016; c. CHADS2VASc => 4 (HTN, stroke x 2, vascular disease)   Patent foramen ovale    a. 05/2008 s/p closure @ time of CABG.   Psoriasis    S/P CABG x 4 2010   Skin cancer    Sleep apnea    Stable angina    Stroke [facility]) 2007   a. 07/2005 right brain; b. 05/2005 left brain; c. Resultant memory deficits.2007 two strokes , in March affecting the left and in May 2007 affecting right hemisphere   Thrombocytopenia     Past Surgical History:  Procedure Laterality Date   ACUTE PANCREATITIS  5/11   APPENDECTOMY  1984   CARDIAC CATHETERIZATION     CATARACT EXTRACTION W/PHACO Right 01/22/2024   Procedure: PHACOEMULSIFICATION, CATARACT, WITH IOL INSERTION 6.90 00:49.3;  Surgeon: Mittie Gaskin, MD;  Location: [facility];  Service: Ophthalmology;  Laterality: Right;   CATARACT EXTRACTION W/PHACO Left 02/05/2024   Procedure: PHACOEMULSIFICATION, CATARACT, WITH IOL INSERTION 3.74 00:28.5;  Surgeon: Mittie Gaskin, MD;  Location: [facility];  Service: Ophthalmology;  Laterality: Left;   CHOLECYSTECTOMY  08/19/2009   COLONOSCOPY WITH PROPOFOL  N/A 06/07/2017   Procedure: COLONOSCOPY WITH PROPOFOL ;  Surgeon: Janalyn Keene NOVAK, MD;  Location: [facility] ENDOSCOPY;  Service: Endoscopy;  Laterality: N/A;   CORONARY ARTERY BYPASS GRAFT  05/12/2008   x4 by PeterVan Trigt,MD   ESOPHAGOGASTRODUODENOSCOPY N/A 03/03/2020   Procedure: ESOPHAGOGASTRODUODENOSCOPY (EGD);  Surgeon: Janalyn Keene NOVAK, MD;  Location[facility];  Service: Endoscopy;  Laterality: N/A;   LEFT HEART CATH AND CORONARY ANGIOGRAPHY N/A  11/29/2016   Procedure: LEFT HEART CATH AND CORONARY ANGIOGRAPHY;  Surgeon: Darron Deatrice LABOR, MD;  Location: [facility] INVASIVE CV LAB;  Service: Cardiovascular;  Laterality: N/A;   PATENT FORAMEN OVALE CLOSURE     TONSILLECTOMY     AS A CHILD    Current Outpatient Medications  Medication Sig Dispense Refill   ALPRAZolam  (XANAX ) 0.5 MG tablet Take 0.5-1 tablets (0.25-0.5 mg total) by mouth at bedtime. 30 tablet 0   ARTHRITIS PAIN RELIEF 650 MG CR tablet Take 650 mg by mouth every 8 (eight) hours as needed.     aspirin  EC 81 MG tablet Take 81 mg by mouth daily.     cetirizine (ZYRTEC) 10 MG tablet Take 10 mg by mouth at bedtime.     clobetasol  ointment (TEMOVATE ) 0.05 % Apply 1 Application topically 2 (two) times daily until smooth, then stop and use as needed. 30 g 2   clopidogrel  (PLAVIX ) 75 MG tablet Take 1 tablet (75 mg total) by mouth daily. 90 tablet 2   desonide  (DESOWEN ) 0.05 % cream Apply 1 application topically 2 (two) times daily as needed (for psoriasis). 30  g 2   desonide  (DESOWEN ) 0.05 % cream Apply 1 Application topically 2 (two) times daily to affected areas on face and neck when flared. 30 g 2   ezetimibe  (ZETIA ) 10 MG tablet Take 1 tablet (10 mg total) by mouth daily. 90 tablet 3   isosorbide  mononitrate (IMDUR ) 30 MG 24 hr tablet Take 1 tablet (30 mg total) by mouth daily. 90 tablet 3   ketoconazole  (NIZORAL ) 2 % shampoo Apply 1 Application topically to affected area (leave on for 5 minutes) , rinse well, use 2-3 times per week. 120 mL 11   LEXAPRO  20 MG tablet Take 1 tablet (20 mg total) by mouth daily. Must make appointment for refills 90 tablet 3   metoprolol  tartrate (LOPRESSOR ) 25 MG tablet Take 0.5 tablets (12.5 mg total) by mouth 2 (two) times daily. 90 tablet 3   nitroGLYCERIN  (NITROSTAT ) 0.4 MG SL tablet PLACE 1 TABLET UNDER THE TONGUE EVERY 5 MINUTES AS NEEDED. MAY REPEAT FOR UP TO 3 DOSES. IF NO RELIEF WITH 1ST DOSE, CALL 911. 25 tablet 0   pantoprazole  (PROTONIX ) 20 MG  tablet Take 1 tablet (20 mg total) by mouth daily. MUST SCHEDULE OFFICE VISIT FOR REFILLS 90 tablet 0   ranolazine  (RANEXA ) 1000 MG SR tablet Take 1 tablet (1,000 mg total) by mouth 2 (two) times daily. 180 tablet 3   rosuvastatin  (CRESTOR ) 40 MG tablet Take 1 tablet (40 mg total) by mouth daily. 90 tablet 3   No current facility-administered medications for this visit.    Allergies as of 05/07/2024 - Review Complete 04/24/2024  Allergen Reaction Noted   Strawberry extract Anaphylaxis 12/04/2011   Dilaudid [hydromorphone hcl] Nausea And Vomiting 06/21/2010    Family History  Problem Relation Age of Onset   Hypertension Mother    Heart disease Mother    Psychiatric Illness Father    Heart attack Father    Diabetes Sister    Ovarian cancer Sister    Lung cancer Paternal Aunt    Cancer Paternal Uncle    Heart disease Paternal Grandmother    Heart disease Paternal Grandfather    Diabetes Other    Coronary artery disease Other    Diabetes Other    Hypertension Other    Hyperlipidemia Other    Sleep apnea Neg Hx     Social History   Socioeconomic History   Marital status: Married    Spouse name: Kevin [name]   Number of children: 3   Years of education: Not on file   Highest education level: Some college, no degree  Occupational History   Occupation: Full time    Employer: [employer]  Tobacco Use   Smoking status: Never   Smokeless tobacco: Never  Vaping Use   Vaping status: Never Used  Substance and Sexual Activity   Alcohol use: Yes    Comment: occassionally   Drug use: Never   Sexual activity: Yes    Birth control/protection: None  Other Topics Concern   Not on file  Social History Narrative   Married Naval Architect) ,Scientist, 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 He has worked at his present job for 22 years. He has been married for 34 years. They have 3 children. 2 of his daughters live in  [place]. He drinks less than two cups of caffeine per day. He does not use  tobacco or recreational drugs. He has rare alcohol intake. Lives with wife and  daughter      OSA diagnosed at [facility] , had his  sleep studies there  reviewed them all in detail today he  has retrognathia, sinusitis,  rhinitis      His ESS remains very high  at 16 and FSS at 52 . His falls assessment tool score is 5.   nasonex, refitted mask, the recent  HST failed to document that  apnea is present at all. education about REM BD and EDS, narcolepsy , cataplexy.  45 minutes.    Social Drivers of Health   Tobacco Use: Low Risk (05/07/2024)   Patient History    Smoking Tobacco Use: Never    Smokeless Tobacco Use: Never    Passive Exposure: Not on file  Financial Resource Strain: Low Risk (03/03/2023)   Overall Financial Resource Strain (CARDIA)    Difficulty of Paying Living Expenses: Not hard at all  Food Insecurity: No Food Insecurity (03/03/2023)   Hunger Vital Sign    Worried About Running Out of Food in the Last Year: Never true    Ran Out of Food in the Last Year: Never true  Transportation Needs: No Transportation Needs (03/03/2023)   PRAPARE - Administrator, Civil Service (Medical): No    Lack of Transportation (Non-Medical): No  Physical Activity: Unknown (03/03/2023)   Exercise Vital Sign    Days of Exercise per Week: 0 days    Minutes of Exercise per Session: Not on file  Stress: No Stress Concern Present (03/03/2023)   [employer] of Occupational Health - Occupational Stress Questionnaire    Feeling of Stress : Not at all  Social Connections: Unknown (03/03/2023)   Social Connection and Isolation Panel    Frequency of Communication with Friends and Family: More than three times a week    Frequency of Social Gatherings with Friends and Family: More than three times a week    Attends Religious Services: Patient declined    Active Member of Clubs or Organizations: No     Attends Banker Meetings: Not on file    Marital Status: Married  Catering Manager Violence: Not on file  Depression (PHQ2-9): Low Risk (02/07/2024)   Depression (PHQ2-9)    PHQ-2 Score: 0  Alcohol Screen: Not on file  Housing: Low Risk (03/03/2023)   Housing    Last Housing Risk Score: 0  Utilities: Not on file  Health Literacy: Not on file     RELEVANT GI HISTORY, IMAGING AND LABS: CBC    Component Value Date/Time   WBC 5.5 02/12/2024 0806   WBC 6.3 09/22/2022 0839   RBC 4.17 02/12/2024 0806   RBC 3.98 (L) 09/22/2022 0839   HGB 13.6 02/12/2024 0806   HCT 41.5 02/12/2024 0806   PLT 190 02/12/2024 0806   MCV 100 (H) 02/12/2024 0806   MCV 94 03/18/2013 1739   MCH 32.6 02/12/2024 0806   MCH 32.2 09/22/2022 0839   MCHC 32.8 02/12/2024 0806   MCHC 34.2 09/22/2022 0839   RDW 11.9 02/12/2024 0806   RDW 13.0 03/18/2013 1739   LYMPHSABS 1.3 01/24/2023 0932   LYMPHSABS 1.8 03/18/2013 1739   MONOABS 0.6 01/24/2021 1538   MONOABS 0.9 03/18/2013 1739   EOSABS 0.3 01/24/2023 0932   EOSABS 0.5 03/18/2013 1739   BASOSABS 0.1 01/24/2023 0932   BASOSABS 0.1 03/18/2013 1739   Recent Labs    02/12/24 0806  HGB 13.6    CMP     Component Value Date/Time  NA 141 02/12/2024 0806   NA 141 03/18/2013 1739   K 4.6 02/12/2024 0806   K 4.0 03/18/2013 1739   CL 102 02/12/2024 0806   CL 107 03/18/2013 1739   CO2 25 02/12/2024 0806   CO2 30 03/18/2013 1739   GLUCOSE 107 (H) 02/12/2024 0806   GLUCOSE 115 (H) 09/22/2022 0839   GLUCOSE 79 03/18/2013 1739   BUN 21 02/12/2024 0806   BUN 23 (H) 03/18/2013 1739   CREATININE 1.52 (H) 02/12/2024 0806   CREATININE 1.50 (H) 12/18/2013 1008   CALCIUM  9.4 02/12/2024 0806   CALCIUM  9.2 03/18/2013 1739   PROT 6.9 02/12/2024 0806   ALBUMIN 4.5 02/12/2024 0806   AST 19 02/12/2024 0806   ALT 13 02/12/2024 0806   ALKPHOS 44 (L) 02/12/2024 0806   BILITOT 0.7 02/12/2024 0806   GFRNONAA 53 (L) 09/22/2022 0839   GFRNONAA 50 (L)  12/18/2013 1008   GFRAA 63 03/29/2020 1027   GFRAA 58 (L) 12/18/2013 1008      Latest Ref Rng & Units 02/12/2024    8:06 AM 01/24/2023    9:32 AM 11/17/2021    8:21 AM  Hepatic Function  Total Protein 6.0 - 8.5 g/dL 6.9  6.6  6.6   Albumin 3.9 - 4.9 g/dL 4.5  4.5  4.5   AST 0 - 40 IU/L 19  16  16    ALT 0 - 44 IU/L 13  12  12    Alk Phosphatase 47 - 123 IU/L 44  43  42   Total Bilirubin 0.0 - 1.2 mg/dL 0.7  0.6  0.6       Review of Systems   All systems reviewed and negative except where noted in HPI.    Physical Exam  BP 128/72   Pulse 78   Temp 98.1 F (36.7 C)   Ht 5' 6 (1.676 m)   Wt 199 lb 12.8 oz (90.6 kg)   SpO2 97%   BMI 32.25 kg/m  No LMP for male patient. General:   Alert and oriented. Pleasant and cooperative. Well-nourished and well-developed. In no acute distress.  Head:  Normocephalic and atraumatic. Eyes:  Without icterus Ears:  Normal auditory acuity. Lungs:  Respirations even and unlabored.  Clear throughout to auscultation.   No wheezes, crackles, or rhonchi. No acute distress. Heart:  Regular rate and rhythm; no murmurs, clicks, rubs, or gallops. Abdomen:  Normal bowel sounds.  No bruits.  Soft, non-tender and non-distended without masses, hepatosplenomegaly or hernias noted.  No guarding or rebound tenderness.   Rectal:  Deferred. Msk:  Symmetrical without gross deformities. Normal posture. Extremities:  Without edema. Neurologic:  Alert and  oriented x4;  grossly normal neurologically. Skin:  Intact without significant lesions or rashes. Psych:  Alert and cooperative. Normal mood and affect.   Assessment & Plan   Darby Shadwick is a 69 y.o. male presenting today for medication refill.   Gastroesophageal reflux disease is well-controlled on pantoprazole  with no current symptoms or complications; adherent to therapy and aware of dietary triggers. Sent one-year supply refill for pantoprazole  20 mg daily to pharmacy. Provided dietary counseling to  avoid caffeine, carbonation, spicy, greasy, and fatty foods, alcohol, and NSAIDs. Patient is overdue for repeat colonoscopy, will get this scheduled. Patient endorses mild intermittent constipation. Recommended increased water intake to improve stool consistency and use of over-the-counter polyethylene glycol (Miralax).  Follow up in 1 year of sooner if needed for any GI concerns   Grayce Bohr, DNP, AGNP-C  [facility]   "

## 2024-05-07 ENCOUNTER — Encounter: Payer: Self-pay | Admitting: Family Medicine

## 2024-05-07 ENCOUNTER — Other Ambulatory Visit (HOSPITAL_COMMUNITY): Payer: Self-pay

## 2024-05-07 ENCOUNTER — Ambulatory Visit: Admitting: Family Medicine

## 2024-05-07 ENCOUNTER — Telehealth: Payer: Self-pay | Admitting: *Deleted

## 2024-05-07 VITALS — BP 128/72 | HR 78 | Temp 98.1°F | Ht 66.0 in | Wt 199.8 lb

## 2024-05-07 DIAGNOSIS — K5900 Constipation, unspecified: Secondary | ICD-10-CM

## 2024-05-07 DIAGNOSIS — K219 Gastro-esophageal reflux disease without esophagitis: Secondary | ICD-10-CM | POA: Diagnosis not present

## 2024-05-07 DIAGNOSIS — Z1211 Encounter for screening for malignant neoplasm of colon: Secondary | ICD-10-CM

## 2024-05-07 DIAGNOSIS — K5909 Other constipation: Secondary | ICD-10-CM

## 2024-05-07 MED ORDER — PANTOPRAZOLE SODIUM 20 MG PO TBEC
20.0000 mg | DELAYED_RELEASE_TABLET | Freq: Every day | ORAL | 3 refills | Status: AC
  Filled 2024-05-07: qty 90, 90d supply, fill #0

## 2024-05-07 MED ORDER — PEG 3350-KCL-NA BICARB-NACL 420 G PO SOLR
4000.0000 mL | Freq: Once | ORAL | 0 refills | Status: AC
  Filled 2024-05-07: qty 4000, 1d supply, fill #0

## 2024-05-07 NOTE — Telephone Encounter (Signed)
 Kevin [name] D, CMA  Joylene, Bradley; Ziegler, Melissa; Tucker, Dolanda; Soldano, Christina; Sheree Glatter New orders have been placed for the above pt, DOB: 2055/11/02   Thank you

## 2024-05-07 NOTE — Telephone Encounter (Signed)
"  ° °  Pre-operative Risk Assessment    Patient Name: Kevin [name]  DOB: Aug 28, 1955 MRN: [id number]   Date of last office visit: 04/24/24 Date of next office visit: N/A   Request for Surgical Clearance    Procedure:  COLONOSCOPY  Date of Surgery:  Clearance 06/12/24                                Surgeon:  NOT PROVIDED Surgeon's Group or Practice Name: [facility] GASTR[facility]OLOGY AT [facility] Phone number:  [phone] Fax number:  [phone] [facility]   Type of Clearance Requested:   - Medical  - Pharmacy:  Hold Clopidogrel  (Plavix ) N/A   Type of Anesthesia:  Not Indicated   Additional requests/questions:    Signed, Apolinar Essex   05/07/2024, 4:32 PM   "

## 2024-05-08 ENCOUNTER — Other Ambulatory Visit (HOSPITAL_COMMUNITY): Payer: Self-pay

## 2024-05-08 ENCOUNTER — Other Ambulatory Visit: Payer: Self-pay

## 2024-05-08 NOTE — Telephone Encounter (Signed)
 Med[place],  You saw this patient on 04/24/2024. Per protocol we request that you comment on his cardiac risk to proceed with colonoscopy on 06/12/2024, since it has been less than 2 months since evaluated in the office. He remains on Plavix  and ASA. Surgeon only requests holding Plavix . Please send your comment to P CV Pre-Op Pool.  Thank you, [name] DNP, ANP, AACC.

## 2024-05-08 NOTE — Telephone Encounter (Signed)
"  ° °  Patient Name: Kevin [name]  DOB: [birth year] MRN: [id number]  Primary Cardiologist: Evalene Lunger, MD  Chart reviewed as part of pre-operative protocol coverage. Given past medical history and time since last visit, based on ACC/AHA guidelines, [name] is at acceptable risk for the planned procedure without further cardiovascular testing.  He may hold clopidogrel  therapy for 5 days prior the planned procedure (colonoscopy) and should resume post-procedure when cleared by performing operator.  Continue aspirin , ? blocker, and statin therapy throughout the peri-procedural period.  I will route this recommendation to the requesting party via Epic fax function and remove from pre-op pool.  Please call with questions.  Lonni Meager, NP 05/08/2024, 5:48 PM  "

## 2024-06-12 ENCOUNTER — Ambulatory Visit: Admit: 2024-06-12

## 2024-08-07 ENCOUNTER — Encounter

## 2025-05-10 ENCOUNTER — Ambulatory Visit: Admitting: Family Medicine
